# Patient Record
Sex: Female | Born: 1945 | Race: White | Hispanic: No | Marital: Married | Smoking: Never smoker
Health system: Southern US, Community
[De-identification: ages and names within clinical notes are randomized; demographics above are authoritative.]

## PROBLEM LIST (undated history)

## (undated) DIAGNOSIS — M1990 Unspecified osteoarthritis, unspecified site: Secondary | ICD-10-CM

## (undated) DIAGNOSIS — C50919 Malignant neoplasm of unspecified site of unspecified female breast: Secondary | ICD-10-CM

## (undated) DIAGNOSIS — C801 Malignant (primary) neoplasm, unspecified: Secondary | ICD-10-CM

## (undated) HISTORY — PX: WISDOM TOOTH EXTRACTION: SHX21

---

## 2015-12-14 DIAGNOSIS — H259 Unspecified age-related cataract: Secondary | ICD-10-CM | POA: Insufficient documentation

## 2018-12-07 DIAGNOSIS — H04123 Dry eye syndrome of bilateral lacrimal glands: Secondary | ICD-10-CM | POA: Insufficient documentation

## 2020-10-27 DIAGNOSIS — D649 Anemia, unspecified: Secondary | ICD-10-CM | POA: Insufficient documentation

## 2020-10-27 DIAGNOSIS — R7303 Prediabetes: Secondary | ICD-10-CM | POA: Insufficient documentation

## 2020-11-27 ENCOUNTER — Telehealth: Payer: Self-pay | Admitting: Hematology and Oncology

## 2020-11-27 NOTE — Telephone Encounter (Signed)
I received a call from Marie Day to schedule a new pt appt. She was recently dx w/breast cancer. Marie Day will be moving form [place] state. She has been scheduled to see Dr. Lindi Adie on 8/4 at 1pm.

## 2020-12-07 ENCOUNTER — Telehealth: Payer: Self-pay | Admitting: Hematology and Oncology

## 2020-12-07 NOTE — Telephone Encounter (Signed)
Marie Day cld to reschedule her appt w/Dr. Lindi Adie on 8/1 at 1pm.

## 2020-12-19 NOTE — Progress Notes (Signed)
[facility] CONSULT NOTE  Patient Care Team: Pcp, No as PCP - General  CHIEF COMPLAINTS/PURPOSE OF CONSULTATION:  Newly diagnosed invasive ductal carinoma   HISTORY OF PRESENTING ILLNESS:  Marie Day 75 y.o. female is here because of recent diagnosis of invasive ductal carcinoma of the right breast. She palpated a mass in the upper outer quadrant of the right breast. Diagnostic mammogram and [place] on 11/10/20 showed a spiculated mass in the upper outer quadrant of the right breast with associated malignant type microcalcifications 4-5 cm anterior to the lump, and no suspicious findings in the left breast. 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 She presents to the clinic today for initial evaluation and discussion of treatment options.  The entire initial work-up was performed at [facility] in [place].  She was planning on moving to [place] within the diagnosis came around they decided to move urgently.  I reviewed her records extensively and collaborated the history with the patient.  SUMMARY OF ONCOLOGIC HISTORY: Oncology History Overview Note  Invasive ductal carcinoma of the right breast  She palpated a mass in UOQ right breast. Diagnostic mammogram and [place] on 11/10/20 showed a spiculated mass in UOQ right breast with associated malignant type microcalcifications. 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     Malignant neoplasm of upper-outer quadrant of right breast in female, estrogen receptor negative ([place])  11/17/2020 Initial Diagnosis   Work-up performed at [facility]: Palpable right breast mass.  Mammogram and ultrasound 11/10/2020: Spiculated 4.9 cm mass UOQ right breast with microcalcifications, biopsy  revealed IDC ER/PR negative, HER2 positive 3+ by IHC, axillary lymph node positive (2 lymph nodes were detected)   12/03/2020 PET scan   Right breast malignancy SUV 4.6, subcentimeter right level 1 and 2 axillary lymph nodes.  No distant metastatic disease.     MEDICAL HISTORY: Osteoarthritis  SURGICAL HISTORY: No prior major surgeries,  water removal  SOCIAL HISTORY: Denies any tobacco.  Drinks alcohol socially.  FAMILY HISTORY: Mother passed away from metastatic carcinoma to the bone, father is alive at 50, maternal grandmother died of pancreatic cancer in her 70s, paternal grandmother died of pancreatic cancer in 40s   ALLERGIES:  has no allergies on file.  MEDICATIONS: Takes ibuprofen with Tylenol for arthritis  REVIEW OF SYSTEMS:   Constitutional: Denies fevers, chills or abnormal night sweats Eyes: Denies blurriness of vision, double vision or watery eyes Ears, nose, mouth, throat, and face: Denies mucositis or sore throat Respiratory: Denies cough, dyspnea or wheezes Cardiovascular: Denies palpitation, chest discomfort or lower extremity swelling Gastrointestinal:  Denies nausea, heartburn or change in bowel habits Skin: Denies abnormal skin rashes Lymphatics: Denies new lymphadenopathy or easy bruising Neurological:Denies numbness, tingling or new weaknesses, complains of severe osteoarthritis of her neck back arms and extremities Behavioral/Psych: Mood is stable, no new changes  Breast: Large palpable mass in the right breast All other systems were reviewed with the patient and are negative.  PHYSICAL EXAMINATION: ECOG PERFORMANCE STATUS: 1 - Symptomatic but completely ambulatory  Vitals:   12/21/20 1258  BP: (!) 115/49  Pulse: 73  Resp: 18  Temp: (!) 97.5 F (36.4 C)  SpO2: 99%   Filed Weights   12/21/20 1258  Weight: 164 lb 12.8 oz (74.8 kg)  GENERAL:alert, no distress and comfortable SKIN: skin color, texture, turgor are normal, no rashes or  significant lesions EYES: normal, conjunctiva are pink and non-injected, sclera clear OROPHARYNX:no exudate, no erythema and lips, buccal mucosa, and tongue normal  NECK: supple, thyroid normal size, non-tender, without nodularity LYMPH:  no palpable lymphadenopathy in the cervical, axillary or inguinal LUNGS: clear to auscultation and percussion with normal breathing effort HEART: regular rate & rhythm and no murmurs and no lower extremity edema ABDOMEN:abdomen soft, non-tender and normal bowel sounds Musculoskeletal:no cyanosis of digits and no clubbing  PSYCH: alert & oriented x 3 with fluent speech NEURO: no focal motor/sensory deficits BREAST: Large palpable mass in the right breast no palpable axillary or supraclavicular lymphadenopathy (exam performed in the presence of a chaperone)    ASSESSMENT AND PLAN:  Malignant neoplasm of upper-outer quadrant of right breast in female, estrogen receptor negative ([name]) 11/17/2020:Work-up performed at [facility]: Palpable right breast mass.  Mammogram and ultrasound 11/10/2020: Spiculated 4.9 cm mass UOQ right breast with microcalcifications, biopsy revealed IDC ER/PR negative, HER2 positive 3+ by IHC, axillary lymph node positive (2 lymph nodes were detected)  Genetics were negative  Pathology and radiology counseling: Discussed with the patient, the details of pathology including the type of breast cancer,the clinical staging, the significance of ER, PR and HER-2/neu receptors and the implications for treatment. After reviewing the pathology in detail, we proceeded to discuss the different treatment options between surgery, radiation, chemotherapy, antiestrogen therapies.  Recommendation based on multidisciplinary tumor board: 1. Neoadjuvant chemotherapy with TCH Perjeta 6 cycles followed by Herceptin Perjeta maintenance versus Kadcyla maintenance (based on response to neoadjuvant chemo) for 1 year 2. Followed by breast conserving surgery  if possible with targeted node surgery 3. Followed by adjuvant radiation therapy  Chemotherapy Counseling: I discussed the risks and benefits of chemotherapy including the risks of nausea/ vomiting, risk of infection from low WBC count, fatigue due to chemo or anemia, bruising or bleeding due to low platelets, mouth sores, loss/ change in taste and decreased appetite. Liver and kidney function will be monitored through out chemotherapy as abnormalities in liver and kidney function may be a side effect of treatment. Cardiac dysfunction due to Herceptin and Perjeta and neuropathy risk from Taxotere were discussed in detail. Risk of permanent bone marrow dysfunction due to chemo were also discussed.  Plan: 1. Echocardiogram 2. Chemotherapy class 3. Breast MRI 4.  On the initial ultrasound there was a report of microcalcifications.  It is possible that we might need to biopsy those.  Nausea clinical trial participation was also recommended. Return to clinic in 1 weeks to start chemotherapy.  All questions were answered. The patient knows to call the clinic with any problems, questions or concerns.   Rulon Eisenmenger, MD, MPH 12/21/2020    I, Thana Ates, am acting as scribe for Nicholas Lose, MD.  I have reviewed the above documentation for accuracy and completeness, and I agree with the above.

## 2020-12-21 ENCOUNTER — Encounter: Payer: Self-pay | Admitting: *Deleted

## 2020-12-21 ENCOUNTER — Inpatient Hospital Stay: Payer: Medicare (Managed Care) | Attending: Hematology and Oncology | Admitting: Hematology and Oncology

## 2020-12-21 ENCOUNTER — Telehealth: Payer: Self-pay | Admitting: *Deleted

## 2020-12-21 ENCOUNTER — Encounter: Payer: Self-pay | Admitting: Hematology and Oncology

## 2020-12-21 ENCOUNTER — Other Ambulatory Visit: Payer: Self-pay | Admitting: *Deleted

## 2020-12-21 ENCOUNTER — Other Ambulatory Visit: Payer: Self-pay

## 2020-12-21 VITALS — BP 115/49 | HR 73 | Temp 97.5°F | Resp 18 | Wt 164.8 lb

## 2020-12-21 DIAGNOSIS — C50411 Malignant neoplasm of upper-outer quadrant of right female breast: Secondary | ICD-10-CM | POA: Diagnosis not present

## 2020-12-21 DIAGNOSIS — Z5189 Encounter for other specified aftercare: Secondary | ICD-10-CM | POA: Diagnosis not present

## 2020-12-21 DIAGNOSIS — Z171 Estrogen receptor negative status [ER-]: Secondary | ICD-10-CM | POA: Insufficient documentation

## 2020-12-21 DIAGNOSIS — Z5111 Encounter for antineoplastic chemotherapy: Secondary | ICD-10-CM | POA: Diagnosis not present

## 2020-12-21 DIAGNOSIS — Z79899 Other long term (current) drug therapy: Secondary | ICD-10-CM | POA: Diagnosis not present

## 2020-12-21 MED ORDER — DEXAMETHASONE 4 MG PO TABS: 4.0000 mg | ORAL_TABLET | Freq: Every day | ORAL | 0 refills | Status: DC

## 2020-12-21 MED ORDER — ONDANSETRON HCL 8 MG PO TABS: 8.0000 mg | ORAL_TABLET | Freq: Two times a day (BID) | ORAL | 1 refills | Status: DC | PRN

## 2020-12-21 MED ORDER — LIDOCAINE-PRILOCAINE 2.5-2.5 % EX CREA: TOPICAL_CREAM | CUTANEOUS | 3 refills | Status: DC

## 2020-12-21 MED ORDER — PROCHLORPERAZINE MALEATE 10 MG PO TABS: 10.0000 mg | ORAL_TABLET | Freq: Four times a day (QID) | ORAL | 1 refills | Status: DC | PRN

## 2020-12-21 NOTE — Research (Signed)
Trial:  [id number] - TREATMENT OF REFRACTORY NAUSEA  Patient Marie Day was identified by Dr. Lindi Adie as a potential candidate for the above listed study.  This Clinical Research Nurse met with Marie Day, [id number], on 12/21/20 in a manner and location that ensures patient privacy to discuss participation in the above listed research study.  Patient is Accompanied by her son, Marie Day .  A copy of the informed consent document and separate HIPAA Authorization was provided to the patient.  Patient reads, speaks, and understands [place].   Patient was provided with the business card of this Nurse and encouraged to contact the research team with any questions.  Approximately 5 minutes were spent with the patient reviewing the informed consent documents.  Patient was provided the option of taking informed consent documents home to review and was encouraged to review at their convenience with their support network, including other care providers. Patient took the consent documents home to review. Foye Spurling, BSN, RN Clinical Research Nurse 12/21/2020

## 2020-12-21 NOTE — Assessment & Plan Note (Signed)
11/17/2020:Work-up performed at [facility]: Palpable right breast mass.  Mammogram and ultrasound 11/10/2020: Spiculated 4.9 cm mass UOQ right breast with microcalcifications, biopsy revealed IDC ER/PR negative, HER2 positive 3+ by IHC, axillary lymph node positive (2 lymph nodes were detected)  Pathology and radiology counseling: Discussed with the patient, the details of pathology including the type of breast cancer,the clinical staging, the significance of ER, PR and HER-2/neu receptors and the implications for treatment. After reviewing the pathology in detail, we proceeded to discuss the different treatment options between surgery, radiation, chemotherapy, antiestrogen therapies.  Recommendation based on multidisciplinary tumor board: 1. Neoadjuvant chemotherapy with TCH Perjeta 6 cycles followed by Herceptin Perjeta maintenance versus Kadcyla maintenance (based on response to neoadjuvant chemo) for 1 year 2. Followed by breast conserving surgery if possible with sentinel lymph node study 3. Followed by adjuvant radiation therapy if patient had lumpectomy  Chemotherapy Counseling: I discussed the risks and benefits of chemotherapy including the risks of nausea/ vomiting, risk of infection from low WBC count, fatigue due to chemo or anemia, bruising or bleeding due to low platelets, mouth sores, loss/ change in taste and decreased appetite. Liver and kidney function will be monitored through out chemotherapy as abnormalities in liver and kidney function may be a side effect of treatment. Cardiac dysfunction due to Herceptin and Perjeta and neuropathy risk from Taxotere were discussed in detail. Risk of permanent bone marrow dysfunction due to chemo were also discussed.  Plan: 1. Echocardiogram 2. Chemotherapy class 3. Breast MRI  Nausea clinical trial participation was also recommended. Return to clinic in 2 weeks to start chemotherapy.

## 2020-12-21 NOTE — Telephone Encounter (Signed)
Spoke to pt regarding navigation resources and provided navigation resources. Discussed breast MRI, echo and chemo class. Confirmed appt dates and time. Denies further questions or needs at this time.

## 2020-12-21 NOTE — Progress Notes (Signed)
START ON PATHWAY REGIMEN - Breast     A cycle is every 21 days:     Pertuzumab      Pertuzumab      Trastuzumab-xxxx      Trastuzumab-xxxx      Carboplatin      Docetaxel   **Always confirm dose/schedule in your pharmacy ordering system**  Patient Characteristics: Preoperative or Nonsurgical Candidate (Clinical Staging), Neoadjuvant Therapy followed by Surgery, Invasive Disease, Chemotherapy, HER2 Positive, ER Negative/Unknown Therapeutic Status: Preoperative or Nonsurgical Candidate (Clinical Staging) AJCC M Category: cM0 AJCC Grade: G3 Breast Surgical Plan: Neoadjuvant Therapy followed by Surgery ER Status: Negative (-) AJCC 8 Stage Grouping: IIB HER2 Status: Positive (+) AJCC T Category: cT2 AJCC N Category: cN1 PR Status: Negative (-) Intent of Therapy: Curative Intent, Discussed with Patient 

## 2020-12-22 ENCOUNTER — Telehealth: Payer: Self-pay | Admitting: *Deleted

## 2020-12-22 ENCOUNTER — Encounter: Payer: Self-pay | Admitting: Hematology and Oncology

## 2020-12-22 ENCOUNTER — Telehealth: Payer: Self-pay | Admitting: Hematology and Oncology

## 2020-12-22 NOTE — Telephone Encounter (Signed)
Discussed pre-meds/anti-nausea medications. Informed pt will discuss in chemo education as well. Discussed appts for 8/8 and 8/9.  Pt discussed cost of medications. Informed pt referral to [facility] will be made.

## 2020-12-22 NOTE — Telephone Encounter (Signed)
Scheduled appts per 8/1 sch msg. Called pt, no answer. Left msg with appts dates and times.

## 2020-12-22 NOTE — Progress Notes (Signed)
Pharmacist Chemotherapy Monitoring - Initial Assessment    Anticipated start date: 12/29/20   The following has been reviewed per standard work regarding the patient's treatment regimen: The patient's diagnosis, treatment plan and drug doses, and organ/hematologic function Lab orders and baseline tests specific to treatment regimen  The treatment plan start date, drug sequencing, and pre-medications Prior authorization status  Patient's documented medication list, including drug-drug interaction screen and prescriptions for anti-emetics and supportive care specific to the treatment regimen The drug concentrations, fluid compatibility, administration routes, and timing of the medications to be used The patient's access for treatment and lifetime cumulative dose history, if applicable  The patient's medication allergies and previous infusion related reactions, if applicable   Changes made to treatment plan:  treatment plan date  Follow up needed:  Pending authorization for treatment    Wynona Neat, [facility], 12/22/2020  12:03 PM

## 2020-12-24 ENCOUNTER — Ambulatory Visit
Admission: RE | Admit: 2020-12-24 | Discharge: 2020-12-24 | Disposition: A | Discharge status: Home / Self Care | Payer: Medicare (Managed Care) | Source: Ambulatory Visit | Attending: Hematology and Oncology | Admitting: Hematology and Oncology

## 2020-12-24 ENCOUNTER — Encounter: Payer: Self-pay | Admitting: Hematology and Oncology

## 2020-12-24 ENCOUNTER — Ambulatory Visit: Payer: Self-pay | Admitting: Hematology and Oncology

## 2020-12-24 ENCOUNTER — Other Ambulatory Visit: Payer: Self-pay

## 2020-12-24 ENCOUNTER — Telehealth: Payer: Self-pay | Admitting: *Deleted

## 2020-12-24 DIAGNOSIS — Z171 Estrogen receptor negative status [ER-]: Secondary | ICD-10-CM

## 2020-12-24 DIAGNOSIS — C50411 Malignant neoplasm of upper-outer quadrant of right female breast: Secondary | ICD-10-CM

## 2020-12-24 MED ORDER — GADOBUTROL 1 MMOL/ML IV SOLN
8.0000 mL | Freq: Once | INTRAVENOUS | Status: AC | PRN
  Administered 2020-12-24: 8 mL via INTRAVENOUS

## 2020-12-24 NOTE — Telephone Encounter (Signed)
[facility] Nausea Study: LVM for patient requesting call back to discuss her interest in this study.  Foye Spurling, BSN, RN Clinical Research Nurse 12/24/2020 1:21 PM

## 2020-12-24 NOTE — Telephone Encounter (Signed)
Patient returned call and states she hasn't had the opportunity to review the study consent form but is still interested and is willing to meet with research nurse tomorrow after her chemotherapy education class. She says she will try to make time to read it before then.  Thanked patient for her call and look forward to seeing her tomorrow afternoon. Foye Spurling, BSN, RN Clinical Research Nurse 12/24/2020 4:25 PM

## 2020-12-25 ENCOUNTER — Encounter: Payer: Self-pay | Admitting: Licensed Clinical Social Worker

## 2020-12-25 ENCOUNTER — Inpatient Hospital Stay: Payer: Medicare (Managed Care)

## 2020-12-25 ENCOUNTER — Encounter: Payer: Self-pay | Admitting: Hematology and Oncology

## 2020-12-25 ENCOUNTER — Ambulatory Visit (HOSPITAL_COMMUNITY)
Admission: RE | Admit: 2020-12-25 | Discharge: 2020-12-25 | Disposition: A | Discharge status: Home / Self Care | Payer: Medicare (Managed Care) | Source: Ambulatory Visit | Attending: Hematology and Oncology | Admitting: Hematology and Oncology

## 2020-12-25 ENCOUNTER — Encounter: Payer: Self-pay | Admitting: *Deleted

## 2020-12-25 DIAGNOSIS — C50411 Malignant neoplasm of upper-outer quadrant of right female breast: Secondary | ICD-10-CM

## 2020-12-25 DIAGNOSIS — Z171 Estrogen receptor negative status [ER-]: Secondary | ICD-10-CM | POA: Diagnosis not present

## 2020-12-25 DIAGNOSIS — Z0189 Encounter for other specified special examinations: Secondary | ICD-10-CM | POA: Diagnosis not present

## 2020-12-25 DIAGNOSIS — Z01810 Encounter for preprocedural cardiovascular examination: Secondary | ICD-10-CM | POA: Diagnosis present

## 2020-12-25 LAB — ECHOCARDIOGRAM COMPLETE
Area-P 1/2: 2.9 cm2
Calc EF: 60.8 %
S' Lateral: 3 cm
Single Plane A2C EF: 63.6 %
Single Plane A4C EF: 57.5 %

## 2020-12-25 NOTE — Progress Notes (Signed)
Called pt to introduce myself as her Arboriculturist.  Unfortunately there aren't any foundations offering copay assistance for her Dx and the type of ins she has.  I informed her of the [employer], went over what it covers and gave her the income requirement.  Pt stated she exceeds the income requirement so she doesn't qualify for the grant at this time.  She inquired about assistance for oral medications so I gave her the number to [facility] to see if they can provide her meds at a discounted price.

## 2020-12-25 NOTE — Progress Notes (Signed)
[facility] Clinical Social Work  Clinical Social Work was referred by RN navigator for assessment of psychosocial needs.  Clinical Social Worker met with patient  to offer support and assess for needs.    Patient recently moved to [place] from [place] (her son and daughter-in-law live here) for her cancer treatment. She is running into an issue where she needs to wait ~10 days for Medicare Part D and other insurance to transfer but needs to buy medications in the meantime in preparation for chemo treatment, one of which is over $100. The insurance told her to save receipts to get reimbursed once the change is processed.  CSW also provided information on GoodRx to potentially help find the medication for lower cost.  No other needs at this time.  CSW informed patient of the support team and support services at [facility].  CSW provided contact information and encouraged patient to call with any questions or concerns.   MICHELLE E ZAVALA, LCSW  Clinical Social Worker [facility]         

## 2020-12-25 NOTE — Progress Notes (Signed)
  Echocardiogram 2D Echocardiogram has been performed.  Marie Day 12/25/2020, 12:08 PM

## 2020-12-25 NOTE — Research (Signed)
[id number] - TREATMENT OF REFRACTORY NAUSEA  Met with patient alone in private room after her chemotherapy education class to discuss the above study. Patient states she had a chance to read the consent form and is not entirely comfortable participating in the study due to not knowing which drug she might be getting if she went onto cycle 2. Patient was also concerned her nausea might not be as well managed as it could be if she received study drug instead of standard of care treatment. Assured patient that participation is voluntary and okay to decline if she is not 100% comfortable with participation. Patient decided to decline this study.  Thanked patient for taking the time to meet with research nurse and consider this study.  Dr. Lindi Adie notified.  Foye Spurling, BSN, RN Clinical Research Nurse 12/25/2020 1:51 PM

## 2020-12-25 NOTE — Research (Signed)
DCP-001: Use of a Clinical Trial Screening Tool to Address Cancer Health Disparities in the [place] [facility])    Patient Marie Day was identified by this Clinical Research Nurse as a potential candidate for the above listed study.  This Clinical Research Nurse met with [name], MRN [id number], on 12/25/20 in a manner and location that ensures patient privacy to discuss participation in the above listed research study.  Patient is Unaccompanied.  A copy of the informed consent document and separate HIPAA Authorization was provided to the patient.  Patient reads, speaks, and understands [place].     Patient was provided the option of taking informed consent documents home to review and was encouraged to review at their convenience with their support network, including other care providers. Patient is comfortable with making a decision regarding study participation today.   As outlined in the informed consent form, this Nurse and Emelia Salisbury discussed the purpose of the research study, the investigational nature of the study, study procedures and requirements for study participation, potential risks and benefits of study participation, as well as alternatives to participation. This study is not blinded. The patient understands participation is voluntary and they may withdraw from study participation at any time.  This study does not involve randomization.  This study does not involve an investigational drug or device. This study does not involve a placebo. Patient understands enrollment is pending full eligibility review.   Confidentiality and how the patient's information will be used as part of study participation were discussed.  Patient was informed there is not reimbursement provided for their time and effort spent on trial participation.     All questions were answered to patient's satisfaction.  The informed consent and separate HIPAA Authorization was reviewed  page by page.  The patient's mental and emotional status is appropriate to provide informed consent, and the patient verbalizes an understanding of study participation.  Patient has agreed to participate in the above listed research study and has voluntarily signed the informed consent protocol version date 11/13/2018 and separate HIPAA Authorization, version 5 on 12/25/20 at 1:28 PM.  The patient was provided with a copy of the signed informed consent form and separate HIPAA Authorization for their reference.  No study specific procedures were obtained prior to the signing of the informed consent document.  Approximately 15 minutes were spent with the patient reviewing the informed consent documents.     After consent/hippa forms signed, this nurse interviewed patient to ask questions for the study that cannot be found in the EMR.  Patient answered without difficulty. Thanked patient for their time and participation on this study. Patient meets all eligibility criteria to be enrolled on this study.   Foye Spurling, BSN, RN Clinical Research Nurse 12/25/2020 1:58 PM

## 2020-12-26 NOTE — Progress Notes (Signed)
Patient Care Team: Pcp, No as PCP - General Pershing Proud, RN as Oncology Nurse Navigator Donnelly Angelica, RN as Oncology Nurse Navigator  DIAGNOSIS:    ICD-10-CM   1. Malignant neoplasm of upper-outer quadrant of right breast in female, estrogen receptor negative (HCC)  C50.411    Z17.1       SUMMARY OF ONCOLOGIC HISTORY: Oncology History Overview Note  Invasive ductal carcinoma of the right breast  She palpated a mass in UOQ right breast. Diagnostic mammogram and [place] on 11/10/20 showed a spiculated mass in UOQ right breast with associated malignant type microcalcifications. 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     Malignant neoplasm of upper-outer quadrant of right breast in female, estrogen receptor negative (HCC)  11/17/2020 Initial Diagnosis   Work-up performed at [facility]: Palpable right breast mass.  Mammogram and ultrasound 11/10/2020: Spiculated 4.9 cm mass UOQ right breast with microcalcifications, biopsy revealed IDC ER/PR negative, HER2 positive 3+ by IHC, axillary lymph node positive (2 lymph nodes were detected)   12/03/2020 PET scan   Right breast malignancy SUV 4.6, subcentimeter right level 1 and 2 axillary lymph nodes.  No distant metastatic disease.   12/21/2020 Cancer Staging   Staging form: Breast, AJCC 8th Edition - Clinical stage from 12/21/2020: Stage IIB (cT2, cN1, cM0, G3, ER-, PR-, HER2+) - Signed by Serena Croissant, MD on 12/21/2020  Stage prefix: Initial diagnosis  Histologic grading system: 3 grade system    12/29/2020 -  Chemotherapy    Patient is on Treatment Plan: BREAST  DOCETAXEL + CARBOPLATIN + TRASTUZUMAB + PERTUZUMAB  (TCHP) Q21D          CHIEF COMPLIANT: Cycle 1 TCH Perjeta to start 12/29/2020  INTERVAL HISTORY: Marie Day is a 75 y.o. with above-mentioned history of invasive ductal carcinoma of the right breast,  to begin chemotherapy with TCH Perjeta.  She reports no major concerns for starting chemotherapy tomorrow.  ALLERGIES:  is allergic to sulfa antibiotics.  MEDICATIONS:  Current Outpatient Medications  Medication Sig Dispense Refill   dexamethasone (DECADRON) 4 MG tablet Take 1 tablet (4 mg total) by mouth daily. Take 1 tablet day before chemo and 1 tablet day after chemo with food 12 tablet 0   lidocaine-prilocaine (EMLA) cream Apply to affected area once 30 g 3   ondansetron (ZOFRAN) 8 MG tablet Take 1 tablet (8 mg total) by mouth 2 (two) times daily as needed (Nausea or vomiting). Start on the third day after chemotherapy. 30 tablet 1   prochlorperazine (COMPAZINE) 10 MG tablet Take 1 tablet (10 mg total) by mouth every 6 (six) hours as needed (Nausea or vomiting). 30 tablet 1   No current facility-administered medications for this visit.   Facility-Administered Medications Ordered in Other Visits  Medication Dose Route Frequency Provider Last Rate Last Admin   sodium chloride flush (NS) 0.9 % injection 10 mL  10 mL Intravenous PRN Serena Croissant, MD   10 mL at 12/28/20 1428    PHYSICAL EXAMINATION: ECOG PERFORMANCE STATUS: 1 - Symptomatic but completely ambulatory  There were no vitals filed for this visit. There were no vitals filed for this visit.  LABORATORY DATA:  I have reviewed the data as listed No flowsheet data found.  Lab Results  Component Value Date   WBC 8.8 12/28/2020   HGB 11.2 (L) 12/28/2020   HCT 35.0 (L) 12/28/2020  MCV 87.5 12/28/2020   PLT 400 12/28/2020   NEUTROABS 5.2 12/28/2020    ASSESSMENT & PLAN:  Malignant neoplasm of upper-outer quadrant of right breast in female, estrogen receptor negative ([place]) 11/17/2020:Work-up performed at [facility]: Palpable right breast mass.  Mammogram and ultrasound 11/10/2020: Spiculated 4.9 cm mass UOQ right breast with microcalcifications, biopsy revealed IDC ER/PR negative, HER2 positive 3+ by IHC, axillary  lymph node positive (2 lymph nodes were detected)   Genetics were negative  Treatment Plan based on multidisciplinary tumor board: 1. Neoadjuvant chemotherapy with TCH Perjeta 6 cycles (started 12/29/2020) followed by Herceptin Perjeta maintenance versus Kadcyla maintenance (based on response to neoadjuvant chemo) for 1 year 2. Followed by breast conserving surgery if possible with targeted node surgery 3. Followed by adjuvant radiation therapy ------------------------------------------------------------------------------------------------------------------- Current Treatment: cycle 1 day 1 TCHP Anti-emetics reviewed, Chemo consent obtained, chemo education completed  RTC in 1 week for tox check    No orders of the defined types were placed in this encounter.  The patient has a good understanding of the overall plan. she agrees with it. she will call with any problems that may develop before the next visit here.  Total time spent: 30 mins including face to face time and time spent for planning, charting and coordination of care  Rulon Eisenmenger, MD, MPH 12/28/2020  I, Thana Ates, am acting as scribe for Dr. Nicholas Lose.  I have reviewed the above documentation for accuracy and completeness, and I agree with the above.

## 2020-12-27 NOTE — Assessment & Plan Note (Signed)
11/17/2020:Work-up performed at [facility]: Palpable right breast mass.  Mammogram and ultrasound 11/10/2020: Spiculated 4.9 cm mass UOQ right breast with microcalcifications, biopsy revealed IDC ER/PR negative, HER2 positive 3+ by IHC, axillary lymph node positive (2 lymph nodes were detected)  Genetics were negative  Treatment Plan based on multidisciplinary tumor board: 1. Neoadjuvant chemotherapy with TCH Perjeta 6 cycles followed by Herceptin Perjeta maintenance versus Kadcyla maintenance (based on response to neoadjuvant chemo) for 1 year 2. Followed by breast conserving surgery if possible with targeted node surgery 3. Followed by adjuvant radiation therapy ------------------------------------------------------------------------------------------------------------------- Current Treatment: cycle 1 day 1 TCHP Anti-emetics reviewed, Chemo consent obtained, chemo education completed  RTC in 1 week for tox check

## 2020-12-28 ENCOUNTER — Inpatient Hospital Stay (HOSPITAL_BASED_OUTPATIENT_CLINIC_OR_DEPARTMENT_OTHER): Payer: Medicare (Managed Care) | Admitting: Hematology and Oncology

## 2020-12-28 ENCOUNTER — Inpatient Hospital Stay: Payer: Medicare (Managed Care)

## 2020-12-28 ENCOUNTER — Other Ambulatory Visit: Payer: Self-pay

## 2020-12-28 ENCOUNTER — Encounter: Payer: Self-pay | Admitting: Hematology and Oncology

## 2020-12-28 DIAGNOSIS — Z95828 Presence of other vascular implants and grafts: Secondary | ICD-10-CM

## 2020-12-28 DIAGNOSIS — C50411 Malignant neoplasm of upper-outer quadrant of right female breast: Secondary | ICD-10-CM | POA: Diagnosis not present

## 2020-12-28 DIAGNOSIS — Z171 Estrogen receptor negative status [ER-]: Secondary | ICD-10-CM | POA: Diagnosis not present

## 2020-12-28 DIAGNOSIS — Z5111 Encounter for antineoplastic chemotherapy: Secondary | ICD-10-CM | POA: Diagnosis not present

## 2020-12-28 LAB — CMP (CANCER CENTER ONLY)
ALT: 18 U/L (ref 0–44)
AST: 16 U/L (ref 15–41)
Albumin: 4 g/dL (ref 3.5–5.0)
Alkaline Phosphatase: 116 U/L (ref 38–126)
Anion gap: 11 (ref 5–15)
BUN: 11 mg/dL (ref 8–23)
CO2: 26 mmol/L (ref 22–32)
Calcium: 10.1 mg/dL (ref 8.9–10.3)
Chloride: 104 mmol/L (ref 98–111)
Creatinine: 0.7 mg/dL (ref 0.44–1.00)
GFR, Estimated: >60 mL/min (ref >60)
Glucose, Bld: 97 mg/dL (ref 70–99)
Potassium: 4.2 mmol/L (ref 3.5–5.1)
Sodium: 141 mmol/L (ref 135–145)
Total Bilirubin: 0.3 mg/dL (ref 0.3–1.2)
Total Protein: 7.5 g/dL (ref 6.5–8.1)

## 2020-12-28 LAB — CBC WITH DIFFERENTIAL (CANCER CENTER ONLY)
Abs Immature Granulocytes: 0.02 10*3/uL (ref 0.00–0.07)
Basophils Absolute: 0.1 10*3/uL (ref 0.0–0.1)
Basophils Relative: 1 %
Eosinophils Absolute: 0.2 10*3/uL (ref 0.0–0.5)
Eosinophils Relative: 2 %
HCT: 35 % — ABNORMAL LOW (ref 36.0–46.0)
Hemoglobin: 11.2 g/dL — ABNORMAL LOW (ref 12.0–15.0)
Immature Granulocytes: 0 %
Lymphocytes Relative: 29 %
Lymphs Abs: 2.6 10*3/uL (ref 0.7–4.0)
MCH: 28 pg (ref 26.0–34.0)
MCHC: 32 g/dL (ref 30.0–36.0)
MCV: 87.5 fL (ref 80.0–100.0)
Monocytes Absolute: 0.8 10*3/uL (ref 0.1–1.0)
Monocytes Relative: 9 %
Neutro Abs: 5.2 10*3/uL (ref 1.7–7.7)
Neutrophils Relative %: 59 %
Platelet Count: 400 10*3/uL (ref 150–400)
RBC: 4 MIL/uL (ref 3.87–5.11)
RDW: 13.6 % (ref 11.5–15.5)
WBC Count: 8.8 10*3/uL (ref 4.0–10.5)
nRBC: 0 % (ref 0.0–0.2)

## 2020-12-28 MED ORDER — HEPARIN SOD (PORK) LOCK FLUSH 100 UNIT/ML IV SOLN
500.0000 [IU] | Freq: Once | INTRAVENOUS | Status: AC
  Administered 2020-12-28: 500 [IU] via INTRAVENOUS
  Filled 2020-12-28: qty 5

## 2020-12-28 MED ORDER — SODIUM CHLORIDE 0.9% FLUSH
10.0000 mL | INTRAVENOUS | Status: DC | PRN
  Administered 2020-12-28: 10 mL via INTRAVENOUS
  Filled 2020-12-28: qty 10

## 2020-12-29 ENCOUNTER — Encounter: Payer: Self-pay | Admitting: *Deleted

## 2020-12-29 ENCOUNTER — Inpatient Hospital Stay: Payer: Medicare (Managed Care)

## 2020-12-29 VITALS — BP 136/71 | HR 61 | Temp 98.1°F | Resp 16 | Ht 65.5 in

## 2020-12-29 DIAGNOSIS — Z5111 Encounter for antineoplastic chemotherapy: Secondary | ICD-10-CM | POA: Diagnosis not present

## 2020-12-29 DIAGNOSIS — C50411 Malignant neoplasm of upper-outer quadrant of right female breast: Secondary | ICD-10-CM

## 2020-12-29 DIAGNOSIS — Z171 Estrogen receptor negative status [ER-]: Secondary | ICD-10-CM

## 2020-12-29 MED ORDER — CARBOPLATIN CHEMO INJECTION 600 MG/60ML
500.0000 mg | Freq: Once | INTRAVENOUS | Status: AC
  Administered 2020-12-29: 500 mg via INTRAVENOUS
  Filled 2020-12-29: qty 50

## 2020-12-29 MED ORDER — PALONOSETRON HCL INJECTION 0.25 MG/5ML
0.2500 mg | Freq: Once | INTRAVENOUS | Status: AC
  Administered 2020-12-29: 0.25 mg via INTRAVENOUS

## 2020-12-29 MED ORDER — SODIUM CHLORIDE 0.9 % IV SOLN
420.0000 mg | Freq: Once | INTRAVENOUS | Status: AC
  Administered 2020-12-29: 420 mg via INTRAVENOUS
  Filled 2020-12-29: qty 14

## 2020-12-29 MED ORDER — SODIUM CHLORIDE 0.9% FLUSH
10.0000 mL | INTRAVENOUS | Status: DC | PRN
  Administered 2020-12-29: 10 mL
  Filled 2020-12-29: qty 10

## 2020-12-29 MED ORDER — TRASTUZUMAB-DKST CHEMO 150 MG IV SOLR
600.0000 mg | Freq: Once | INTRAVENOUS | Status: AC
  Administered 2020-12-29: 600 mg via INTRAVENOUS
  Filled 2020-12-29: qty 28.57

## 2020-12-29 MED ORDER — SODIUM CHLORIDE 0.9 % IV SOLN
10.0000 mg | Freq: Once | INTRAVENOUS | Status: AC
  Administered 2020-12-29: 10 mg via INTRAVENOUS
  Filled 2020-12-29: qty 10

## 2020-12-29 MED ORDER — PALONOSETRON HCL INJECTION 0.25 MG/5ML
INTRAVENOUS | Status: AC
  Filled 2020-12-29: qty 5

## 2020-12-29 MED ORDER — SODIUM CHLORIDE 0.9 % IV SOLN
Freq: Once | INTRAVENOUS | Status: AC
  Filled 2020-12-29: qty 250

## 2020-12-29 MED ORDER — ACETAMINOPHEN 325 MG PO TABS
650.0000 mg | ORAL_TABLET | Freq: Once | ORAL | Status: AC
  Administered 2020-12-29: 650 mg via ORAL

## 2020-12-29 MED ORDER — ACETAMINOPHEN 325 MG PO TABS
ORAL_TABLET | ORAL | Status: AC
  Filled 2020-12-29: qty 2

## 2020-12-29 MED ORDER — HEPARIN SOD (PORK) LOCK FLUSH 100 UNIT/ML IV SOLN
500.0000 [IU] | Freq: Once | INTRAVENOUS | Status: AC | PRN
  Administered 2020-12-29: 500 [IU]
  Filled 2020-12-29: qty 5

## 2020-12-29 MED ORDER — SODIUM CHLORIDE 0.9 % IV SOLN
150.0000 mg | Freq: Once | INTRAVENOUS | Status: AC
  Administered 2020-12-29: 150 mg via INTRAVENOUS
  Filled 2020-12-29: qty 150

## 2020-12-29 MED ORDER — DIPHENHYDRAMINE HCL 25 MG PO CAPS
50.0000 mg | ORAL_CAPSULE | Freq: Once | ORAL | Status: AC
  Administered 2020-12-29: 50 mg via ORAL

## 2020-12-29 MED ORDER — DOCETAXEL CHEMO INJECTION 160 MG/16ML
75.0000 mg/m2 | Freq: Once | INTRAVENOUS | Status: AC
  Administered 2020-12-29: 140 mg via INTRAVENOUS
  Filled 2020-12-29: qty 14

## 2020-12-29 MED ORDER — DIPHENHYDRAMINE HCL 25 MG PO CAPS
ORAL_CAPSULE | ORAL | Status: AC
  Filled 2020-12-29: qty 2

## 2020-12-29 NOTE — Patient Instructions (Signed)
[facility]  Discharge Instructions: Thank you for choosing [facility] to provide your oncology and hematology care.   If you have a lab appointment with the [facility], please go directly to the [facility] and check in at the registration area.   Wear comfortable clothing and clothing appropriate for easy access to any Portacath or PICC line.   We strive to give you quality time with your provider. You may need to reschedule your appointment if you arrive late (15 or more minutes).  Arriving late affects you and other patients whose appointments are after yours.  Also, if you miss three or more appointments without notifying the office, you may be dismissed from the clinic at the provider's discretion.      For prescription refill requests, have your pharmacy contact our office and allow 72 hours for refills to be completed.    Today you received the following chemotherapy and/or immunotherapy agents: Herceptin, perjeta, docetaxel, carboplatin.       To help prevent nausea and vomiting after your treatment, we encourage you to take your nausea medication as directed.  BELOW ARE SYMPTOMS THAT SHOULD BE REPORTED IMMEDIATELY: *FEVER GREATER THAN 100.4 F (38 C) OR HIGHER *CHILLS OR SWEATING *NAUSEA AND VOMITING THAT IS NOT CONTROLLED WITH YOUR NAUSEA MEDICATION *UNUSUAL SHORTNESS OF BREATH *UNUSUAL BRUISING OR BLEEDING *URINARY PROBLEMS (pain or burning when urinating, or frequent urination) *BOWEL PROBLEMS (unusual diarrhea, constipation, pain near the anus) TENDERNESS IN MOUTH AND THROAT WITH OR WITHOUT PRESENCE OF ULCERS (sore throat, sores in mouth, or a toothache) UNUSUAL RASH, SWELLING OR PAIN  UNUSUAL VAGINAL DISCHARGE OR ITCHING   Items with * indicate a potential emergency and should be followed up as soon as possible or go to the Emergency Department if any problems should occur.  Please show the CHEMOTHERAPY ALERT CARD or  IMMUNOTHERAPY ALERT CARD at check-in to the Emergency Department and triage nurse.  Should you have questions after your visit or need to cancel or reschedule your appointment, please contact [facility]  Dept: [phone]  and follow the prompts.  Office hours are 8:00 a.m. to 4:30 p.m. Monday - Friday. Please note that voicemails left after 4:00 p.m. may not be returned until the following business day.  We are closed weekends and major holidays. You have access to a nurse at all times for urgent questions. Please call the main number to the clinic Dept: [phone] and follow the prompts.   For any non-urgent questions, you may also contact your provider using MyChart. We now offer e-Visits for anyone 47 and older to request care online for non-urgent symptoms. For details visit [email or web address].   Also download the MyChart app! Go to the app store, search "MyChart", open the app, select [facility], and log in with your MyChart username and password.  Due to Covid, a mask is required upon entering the hospital/clinic. If you do not have a mask, one will be given to you upon arrival. For doctor visits, patients may have 1 support person aged 14 or older with them. For treatment visits, patients cannot have anyone with them due to current Covid guidelines and our immunocompromised population.   Trastuzumab injection for infusion What is this medication? TRASTUZUMAB (tras TOO zoo mab) is a monoclonal antibody. It is used to treatbreast cancer and stomach cancer. This medicine may be used for other purposes; ask your health care provider orpharmacist if you have questions. COMMON  BRAND NAME(S): Herceptin, Galvin Proffer, Trazimera What should I tell my care team before I take this medication? They need to know if you have any of these conditions: heart disease heart failure lung or breathing disease, like asthma an unusual or allergic  reaction to trastuzumab, benzyl alcohol, or other medications, foods, dyes, or preservatives pregnant or trying to get pregnant breast-feeding How should I use this medication? This drug is given as an infusion into a vein. It is administered in a hospitalor clinic by a specially trained health care professional. Talk to your pediatrician regarding the use of this medicine in children. Thismedicine is not approved for use in children. Overdosage: If you think you have taken too much of this medicine contact apoison control center or emergency room at once. NOTE: This medicine is only for you. Do not share this medicine with others. What if I miss a dose? It is important not to miss a dose. Call your doctor or health careprofessional if you are unable to keep an appointment. What may interact with this medication? This medicine may interact with the following medications: certain types of chemotherapy, such as daunorubicin, doxorubicin, epirubicin, and idarubicin This list may not describe all possible interactions. Give your health care provider a list of all the medicines, herbs, non-prescription drugs, or dietary supplements you use. Also tell them if you smoke, drink alcohol, or use illegaldrugs. Some items may interact with your medicine. What should I watch for while using this medication? Visit your doctor for checks on your progress. Report any side effects. Continue your course of treatment even though you feel ill unless your doctortells you to stop. Call your doctor or health care professional for advice if you get a fever, chills or sore throat, or other symptoms of a cold or flu. Do not treatyourself. Try to avoid being around people who are sick. You may experience fever, chills and shaking during your first infusion. These effects are usually mild and can be treated with other medicines. Report any side effects during the infusion to your health care professional. Fever andchills  usually do not happen with later infusions. Do not become pregnant while taking this medicine or for 7 months after stopping it. Women should inform their doctor if they wish to become pregnant or think they might be pregnant. Women of child-bearing potential will need to have a negative pregnancy test before starting this medicine. There is a potential for serious side effects to an unborn child. Talk to your health care professional or pharmacist for more information. Do not breast-feed an infantwhile taking this medicine or for 7 months after stopping it. Women must use effective birth control with this medicine. What side effects may I notice from receiving this medication? Side effects that you should report to your doctor or health care professionalas soon as possible: allergic reactions like skin rash, itching or hives, swelling of the face, lips, or tongue chest pain or palpitations cough dizziness feeling faint or lightheaded, falls fever general ill feeling or flu-like symptoms signs of worsening heart failure like breathing problems; swelling in your legs and feet unusually weak or tired Side effects that usually do not require medical attention (report to yourdoctor or health care professional if they continue or are bothersome): bone pain changes in taste diarrhea joint pain nausea/vomiting weight loss This list may not describe all possible side effects. Call your doctor for medical advice about side effects. You may report side effects to FDA at1-800-FDA-1088. Where  should I keep my medication? This drug is given in a hospital or clinic and will not be stored at home. NOTE: This sheet is a summary. It may not cover all possible information. If you have questions about this medicine, talk to your doctor, pharmacist, orhealth care provider.  2022 Elsevier/Gold Standard (2016-05-03 14:37:52)  Pertuzumab injection What is this medication? PERTUZUMAB (per TOOZ ue mab) is a  monoclonal antibody. It is used to treatbreast cancer. This medicine may be used for other purposes; ask your health care provider orpharmacist if you have questions. COMMON BRAND NAME(S): PERJETA What should I tell my care team before I take this medication? They need to know if you have any of these conditions: heart disease heart failure high blood pressure history of irregular heart beat recent or ongoing radiation therapy an unusual or allergic reaction to pertuzumab, other medicines, foods, dyes, or preservatives pregnant or trying to get pregnant breast-feeding How should I use this medication? This medicine is for infusion into a vein. It is given by a health careprofessional in a hospital or clinic setting. Talk to your pediatrician regarding the use of this medicine in children.Special care may be needed. Overdosage: If you think you have taken too much of this medicine contact apoison control center or emergency room at once. NOTE: This medicine is only for you. Do not share this medicine with others. What if I miss a dose? It is important not to miss your dose. Call your doctor or health careprofessional if you are unable to keep an appointment. What may interact with this medication? Interactions are not expected. Give your health care provider a list of all the medicines, herbs, non-prescription drugs, or dietary supplements you use. Also tell them if you smoke, drink alcohol, or use illegal drugs. Some items may interact with yourmedicine. This list may not describe all possible interactions. Give your health care provider a list of all the medicines, herbs, non-prescription drugs, or dietary supplements you use. Also tell them if you smoke, drink alcohol, or use illegaldrugs. Some items may interact with your medicine. What should I watch for while using this medication? Your condition will be monitored carefully while you are receiving this medicine. Report any side effects.  Continue your course of treatment eventhough you feel ill unless your doctor tells you to stop. Do not become pregnant while taking this medicine or for 7 months after stopping it. Women should inform their doctor if they wish to become pregnant or think they might be pregnant. Women of child-bearing potential will need to have a negative pregnancy test before starting this medicine. There is a potential for serious side effects to an unborn child. Talk to your health care professional or pharmacist for more information. Do not breast-feed an infantwhile taking this medicine or for 7 months after stopping it. Women must use effective birth control with this medicine. Call your doctor or health care professional for advice if you get a fever, chills or sore throat, or other symptoms of a cold or flu. Do not treatyourself. Try to avoid being around people who are sick. You may experience fever, chills, and headache during the infusion. Report anyside effects during the infusion to your health care professional. What side effects may I notice from receiving this medication? Side effects that you should report to your doctor or health care professionalas soon as possible: breathing problems chest pain or palpitations dizziness feeling faint or lightheaded fever or chills skin rash, itching or hives sore  throat swelling of the face, lips, or tongue swelling of the legs or ankles unusually weak or tired Side effects that usually do not require medical attention (report to yourdoctor or health care professional if they continue or are bothersome): diarrhea hair loss nausea, vomiting tiredness This list may not describe all possible side effects. Call your doctor for medical advice about side effects. You may report side effects to FDA at1-800-FDA-1088. Where should I keep my medication? This drug is given in a hospital or clinic and will not be stored at home. NOTE: This sheet is a summary. It may  not cover all possible information. If you have questions about this medicine, talk to your doctor, pharmacist, orhealth care provider.  2022 Elsevier/Gold Standard (2015-06-11 12:08:50)  Docetaxel injection What is this medication? DOCETAXEL (doe se TAX el) is a chemotherapy drug. It targets fast dividing cells, like cancer cells, and causes these cells to die. This medicine is used to treat many types of cancers like breast cancer, certain stomach cancers,head and neck cancer, lung cancer, and prostate cancer. This medicine may be used for other purposes; ask your health care provider orpharmacist if you have questions. COMMON BRAND NAME(S): Docefrez, Taxotere What should I tell my care team before I take this medication? They need to know if you have any of these conditions: infection (especially a virus infection such as chickenpox, cold sores, or herpes) liver disease low blood counts, like low white cell, platelet, or red cell counts an unusual or allergic reaction to docetaxel, polysorbate 80, other chemotherapy agents, other medicines, foods, dyes, or preservatives pregnant or trying to get pregnant breast-feeding How should I use this medication? This drug is given as an infusion into a vein. It is administered in a hospitalor clinic by a specially trained health care professional. Talk to your pediatrician regarding the use of this medicine in children.Special care may be needed. Overdosage: If you think you have taken too much of this medicine contact apoison control center or emergency room at once. NOTE: This medicine is only for you. Do not share this medicine with others. What if I miss a dose? It is important not to miss your dose. Call your doctor or health careprofessional if you are unable to keep an appointment. What may interact with this medication? Do not take this medicine with any of the following medications: live virus vaccines This medicine may also interact  with the following medications: aprepitant certain antibiotics like erythromycin or clarithromycin certain antivirals for HIV or hepatitis certain medicines for fungal infections like fluconazole, itraconazole, ketoconazole, posaconazole, or voriconazole cimetidine ciprofloxacin conivaptan cyclosporine dronedarone fluvoxamine grapefruit juice imatinib verapamil This list may not describe all possible interactions. Give your health care provider a list of all the medicines, herbs, non-prescription drugs, or dietary supplements you use. Also tell them if you smoke, drink alcohol, or use illegaldrugs. Some items may interact with your medicine. What should I watch for while using this medication? Your condition will be monitored carefully while you are receiving this medicine. You will need important blood work done while you are taking thismedicine. Call your doctor or health care professional for advice if you get a fever, chills or sore throat, or other symptoms of a cold or flu. Do not treat yourself. This drug decreases your body's ability to fight infections. Try toavoid being around people who are sick. Some products may contain alcohol. Ask your health care professional if this medicine contains alcohol. Be sure to tell all health  care professionals you are taking this medicine. Certain medicines, like metronidazole and disulfiram, can cause an unpleasant reaction when taken with alcohol. The reaction includes flushing, headache, nausea, vomiting, sweating, and increased thirst. Thereaction can last from 30 minutes to several hours. You may get drowsy or dizzy. Do not drive, use machinery, or do anything that needs mental alertness until you know how this medicine affects you. Do not stand or sit up quickly, especially if you are an older patient. This reduces the risk of dizzy or fainting spells. Alcohol may interfere with the effect ofthis medicine. Talk to your health care professional  about your risk of cancer. You may bemore at risk for certain types of cancer if you take this medicine. Do not become pregnant while taking this medicine or for 6 months after stopping it. Women should inform their doctor if they wish to become pregnant or think they might be pregnant. There is a potential for serious side effects to an unborn child. Talk to your health care professional or pharmacist for more information. Do not breast-feed an infant while taking this medicine orfor 1 week after stopping it. Males who get this medicine must use a condom during sex with females who can get pregnant. If you get a woman pregnant, the baby could have birth defects. The baby could die before they are born. You will need to continue wearing a condom for 3 months after stopping the medicine. Tell your health care providerright away if your partner becomes pregnant while you are taking this medicine. This may interfere with the ability to father a child. You should talk to yourdoctor or health care professional if you are concerned about your fertility. What side effects may I notice from receiving this medication? Side effects that you should report to your doctor or health care professionalas soon as possible: allergic reactions like skin rash, itching or hives, swelling of the face, lips, or tongue blurred vision breathing problems changes in vision low blood counts - This drug may decrease the number of white blood cells, red blood cells and platelets. You may be at increased risk for infections and bleeding. nausea and vomiting pain, redness or irritation at site where injected pain, tingling, numbness in the hands or feet redness, blistering, peeling, or loosening of the skin, including inside the mouth signs of decreased platelets or bleeding - bruising, pinpoint red spots on the skin, black, tarry stools, nosebleeds signs of decreased red blood cells - unusually weak or tired, fainting spells,  lightheadedness signs of infection - fever or chills, cough, sore throat, pain or difficulty passing urine swelling of the ankle, feet, hands Side effects that usually do not require medical attention (report to yourdoctor or health care professional if they continue or are bothersome): constipation diarrhea fingernail or toenail changes hair loss loss of appetite mouth sores muscle pain This list may not describe all possible side effects. Call your doctor for medical advice about side effects. You may report side effects to FDA at1-800-FDA-1088. Where should I keep my medication? This drug is given in a hospital or clinic and will not be stored at home. NOTE: This sheet is a summary. It may not cover all possible information. If you have questions about this medicine, talk to your doctor, pharmacist, orhealth care provider.  2022 Elsevier/Gold Standard (2019-04-08 19:50:31)  Carboplatin injection What is this medication? CARBOPLATIN (KAR boe pla tin) is a chemotherapy drug. It targets fast dividing cells, like cancer cells, and causes these cells  to die. This medicine is usedto treat ovarian cancer and many other cancers. This medicine may be used for other purposes; ask your health care provider orpharmacist if you have questions. COMMON BRAND NAME(S): Paraplatin What should I tell my care team before I take this medication? They need to know if you have any of these conditions: blood disorders hearing problems kidney disease recent or ongoing radiation therapy an unusual or allergic reaction to carboplatin, cisplatin, other chemotherapy, other medicines, foods, dyes, or preservatives pregnant or trying to get pregnant breast-feeding How should I use this medication? This drug is usually given as an infusion into a vein. It is administered in [place] or clinic by a specially trained health care professional. Talk to your pediatrician regarding the use of this medicine in  children.Special care may be needed. Overdosage: If you think you have taken too much of this medicine contact apoison control center or emergency room at once. NOTE: This medicine is only for you. Do not share this medicine with others. What if I miss a dose? It is important not to miss a dose. Call your doctor or health careprofessional if you are unable to keep an appointment. What may interact with this medication? medicines for seizures medicines to increase blood counts like filgrastim, pegfilgrastim, sargramostim some antibiotics like amikacin, gentamicin, neomycin, streptomycin, tobramycin vaccines Talk to your doctor or health care professional before taking any of thesemedicines: acetaminophen aspirin ibuprofen ketoprofen naproxen This list may not describe all possible interactions. Give your health care provider a list of all the medicines, herbs, non-prescription drugs, or dietary supplements you use. Also tell them if you smoke, drink alcohol, or use illegaldrugs. Some items may interact with your medicine. What should I watch for while using this medication? Your condition will be monitored carefully while you are receiving this medicine. You will need important blood work done while you are taking thismedicine. This drug may make you feel generally unwell. This is not uncommon, as chemotherapy can affect healthy cells as well as cancer cells. Report any side effects. Continue your course of treatment even though you feel ill unless yourdoctor tells you to stop. In some cases, you may be given additional medicines to help with side effects.Follow all directions for their use. Call your doctor or health care professional for advice if you get a fever, chills or sore throat, or other symptoms of a cold or flu. Do not treat yourself. This drug decreases your body's ability to fight infections. Try toavoid being around people who are sick. This medicine may increase your risk to  bruise or bleed. Call your doctor orhealth care professional if you notice any unusual bleeding. Be careful brushing and flossing your teeth or using a toothpick because you may get an infection or bleed more easily. If you have any dental work done,tell your dentist you are receiving this medicine. Avoid taking products that contain aspirin, acetaminophen, ibuprofen, naproxen, or ketoprofen unless instructed by your doctor. These medicines may hide afever. Do not become pregnant while taking this medicine. Women should inform their doctor if they wish to become pregnant or think they might be pregnant. There is a potential for serious side effects to an unborn child. Talk to your health care professional or pharmacist for more information. Do not breast-feed aninfant while taking this medicine. What side effects may I notice from receiving this medication? Side effects that you should report to your doctor or health care professionalas soon as possible: allergic reactions like skin  rash, itching or hives, swelling of the face, lips, or tongue signs of infection - fever or chills, cough, sore throat, pain or difficulty passing urine signs of decreased platelets or bleeding - bruising, pinpoint red spots on the skin, black, tarry stools, nosebleeds signs of decreased red blood cells - unusually weak or tired, fainting spells, lightheadedness breathing problems changes in hearing changes in vision chest pain high blood pressure low blood counts - This drug may decrease the number of white blood cells, red blood cells and platelets. You may be at increased risk for infections and bleeding. nausea and vomiting pain, swelling, redness or irritation at the injection site pain, tingling, numbness in the hands or feet problems with balance, talking, walking trouble passing urine or change in the amount of urine Side effects that usually do not require medical attention (report to yourdoctor or health  care professional if they continue or are bothersome): hair loss loss of appetite metallic taste in the mouth or changes in taste This list may not describe all possible side effects. Call your doctor for medical advice about side effects. You may report side effects to FDA at1-800-FDA-1088. Where should I keep my medication? This drug is given in a hospital or clinic and will not be stored at home. NOTE: This sheet is a summary. It may not cover all possible information. If you have questions about this medicine, talk to your doctor, pharmacist, orhealth care provider.  2022 Elsevier/Gold Standard (2007-08-14 14:38:05)

## 2020-12-29 NOTE — Progress Notes (Signed)
Decrease Carboplatin dose to '500mg'$  per MD.  Acquanetta Belling, RPH, BCPS, BCOP 12/29/2020 11:02 AM

## 2020-12-30 ENCOUNTER — Telehealth: Payer: Self-pay | Admitting: *Deleted

## 2020-12-30 NOTE — Telephone Encounter (Addendum)
Called & left message for pt to return call to let us know how she did with her treatment yesterday.  Pt returned call @ 2:05 pm & reported that she was doing well.

## 2020-12-30 NOTE — Telephone Encounter (Signed)
-----   Message from Wylene Men, RN sent at 12/29/2020  5:46 PM EDT ----- Regarding: [place] Patient received 1st time TCHP.  Tolerated well.  No s/s or c/o distress or discomfort.

## 2020-12-31 ENCOUNTER — Other Ambulatory Visit: Payer: Self-pay

## 2020-12-31 ENCOUNTER — Inpatient Hospital Stay: Payer: Medicare (Managed Care)

## 2020-12-31 VITALS — BP 150/75 | HR 69 | Temp 98.5°F | Resp 18

## 2020-12-31 DIAGNOSIS — Z5111 Encounter for antineoplastic chemotherapy: Secondary | ICD-10-CM | POA: Diagnosis not present

## 2020-12-31 DIAGNOSIS — Z171 Estrogen receptor negative status [ER-]: Secondary | ICD-10-CM

## 2020-12-31 DIAGNOSIS — C50411 Malignant neoplasm of upper-outer quadrant of right female breast: Secondary | ICD-10-CM

## 2020-12-31 MED ORDER — PEGFILGRASTIM-CBQV 6 MG/0.6ML ~~LOC~~ SOSY
6.0000 mg | PREFILLED_SYRINGE | Freq: Once | SUBCUTANEOUS | Status: AC
  Administered 2020-12-31: 6 mg via SUBCUTANEOUS
  Filled 2020-12-31: qty 0.6

## 2020-12-31 NOTE — Patient Instructions (Signed)

## 2021-01-01 ENCOUNTER — Other Ambulatory Visit: Payer: No Typology Code available for payment source

## 2021-01-04 NOTE — Progress Notes (Signed)
Patient Care Team: Pcp, No as PCP - General Mauro Kaufmann, RN as Oncology Nurse Navigator Rockwell Germany, RN as Oncology Nurse Navigator  DIAGNOSIS:    ICD-10-CM   1. Malignant neoplasm of upper-outer quadrant of right breast in female, estrogen receptor negative ([place])  C50.411    Z17.1       SUMMARY OF ONCOLOGIC HISTORY: Oncology History Overview Note  Invasive ductal carcinoma of the right breast  She palpated a mass in UOQ right breast. Diagnostic mammogram and [place] on 11/10/20 showed a spiculated mass in UOQ right breast with associated malignant type microcalcifications. 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     Malignant neoplasm of upper-outer quadrant of right breast in female, estrogen receptor negative ([place])  11/17/2020 Initial Diagnosis   Work-up performed at [facility]: Palpable right breast mass.  Mammogram and ultrasound 11/10/2020: Spiculated 4.9 cm mass UOQ right breast with microcalcifications, biopsy revealed IDC ER/PR negative, HER2 positive 3+ by IHC, axillary lymph node positive (2 lymph nodes were detected)   12/03/2020 PET scan   Right breast malignancy SUV 4.6, subcentimeter right level 1 and 2 axillary lymph nodes.  No distant metastatic disease.   12/21/2020 Cancer Staging   Staging form: Breast, AJCC 8th Edition - Clinical stage from 12/21/2020: Stage IIB (cT2, cN1, cM0, G3, ER-, PR-, HER2+) - Signed by Nicholas Lose, MD on 12/21/2020 Stage prefix: Initial diagnosis Histologic grading system: 3 grade system   12/29/2020 -  Chemotherapy    Patient is on Treatment Plan: BREAST  DOCETAXEL + CARBOPLATIN + TRASTUZUMAB + PERTUZUMAB  (TCHP) Q21D          CHIEF COMPLIANT: Cycle 2 TCH Perjeta  INTERVAL HISTORY: Marie Day is a 75 y.o. with above-mentioned history of invasive ductal carcinoma of the right breast, currently on  chemotherapy with [place]. She presents to the clinic today for cycle 2. she is reporting very mild nausea on day 3 which got better with nausea medication.  She did have 1 episode of loose stools per day.  She did take Imodium which appears to be helping.  She thinks she has aggravated her hemorrhoids.  Denies any bone pain.  She reports that the arthritis has completely resolved.  ALLERGIES:  is allergic to sulfa antibiotics.  MEDICATIONS:  Current Outpatient Medications  Medication Sig Dispense Refill   dexamethasone (DECADRON) 4 MG tablet Take 1 tablet (4 mg total) by mouth daily. Take 1 tablet day before chemo and 1 tablet day after chemo with food 12 tablet 0   lidocaine-prilocaine (EMLA) cream Apply to affected area once 30 g 3   ondansetron (ZOFRAN) 8 MG tablet Take 1 tablet (8 mg total) by mouth 2 (two) times daily as needed (Nausea or vomiting). Start on the third day after chemotherapy. 30 tablet 1   prochlorperazine (COMPAZINE) 10 MG tablet Take 1 tablet (10 mg total) by mouth every 6 (six) hours as needed (Nausea or vomiting). 30 tablet 1   No current facility-administered medications for this visit.    PHYSICAL EXAMINATION: ECOG PERFORMANCE STATUS: 1 - Symptomatic but completely ambulatory  Vitals:   01/05/21 1545  BP: 103/64  Resp: 18  Temp: 97.8 F (36.6 C)  SpO2: 100%   Filed Weights   01/05/21 1545  Weight: 161 lb 4.8 oz (73.2 kg)      LABORATORY DATA:  I have reviewed the data  as listed CMP Latest Ref Rng & Units 12/28/2020  Glucose 70 - 99 mg/dL 97  BUN 8 - 23 mg/dL 11  Creatinine 0.44 - 1.00 mg/dL 0.70  Sodium 135 - 145 mmol/L 141  Potassium 3.5 - 5.1 mmol/L 4.2  Chloride 98 - 111 mmol/L 104  CO2 22 - 32 mmol/L 26  Calcium 8.9 - 10.3 mg/dL 10.1  Total Protein 6.5 - 8.1 g/dL 7.5  Total Bilirubin 0.3 - 1.2 mg/dL 0.3  Alkaline Phos 38 - 126 U/L 116  AST 15 - 41 U/L 16  ALT 0 - 44 U/L 18    Lab Results  Component Value Date   WBC 18.2 (H)  01/05/2021   HGB 11.3 (L) 01/05/2021   HCT 34.8 (L) 01/05/2021   MCV 85.9 01/05/2021   PLT 305 01/05/2021   NEUTROABS PENDING 01/05/2021    ASSESSMENT & PLAN:  Malignant neoplasm of upper-outer quadrant of right breast in female, estrogen receptor negative ([place]) 11/17/2020:Work-up performed at [facility]: Palpable right breast mass.  Mammogram and ultrasound 11/10/2020: Spiculated 4.9 cm mass UOQ right breast with microcalcifications, biopsy revealed IDC ER/PR negative, HER2 positive 3+ by IHC, axillary lymph node positive (2 lymph nodes were detected)   Genetics were negative   Treatment Plan based on multidisciplinary tumor board: 1. Neoadjuvant chemotherapy with TCH Perjeta 6 cycles (started 12/29/2020) followed by Herceptin Perjeta maintenance versus Kadcyla maintenance (based on response to neoadjuvant chemo) for 1 year 2. Followed by breast conserving surgery if possible with targeted node surgery 3. Followed by adjuvant radiation therapy ------------------------------------------------------------------------------------------------------------------- Current Treatment: Cycle 1 day 8 TCHP Chemo Toxicities: Mild nausea on day 3 Mild intermittent diarrhea usually 1 loose stool per day responds to Imodium Fatigue  She reports that her arthritis symptoms have completely disappeared.   RTC in 2 weeks for Cycle 2    No orders of the defined types were placed in this encounter.  The patient has a good understanding of the overall plan. she agrees with it. she will call with any problems that may develop before the next visit here.  Total time spent: 30 mins including face to face time and time spent for planning, charting and coordination of care  Rulon Eisenmenger, MD, MPH 01/05/2021  I, Thana Ates, am acting as scribe for Dr. Nicholas Lose.  I have reviewed the above documentation for accuracy and completeness, and I agree with the above.

## 2021-01-05 ENCOUNTER — Other Ambulatory Visit: Payer: Self-pay

## 2021-01-05 ENCOUNTER — Encounter: Payer: Self-pay | Admitting: *Deleted

## 2021-01-05 ENCOUNTER — Inpatient Hospital Stay (HOSPITAL_BASED_OUTPATIENT_CLINIC_OR_DEPARTMENT_OTHER): Payer: Medicare (Managed Care) | Admitting: Hematology and Oncology

## 2021-01-05 ENCOUNTER — Other Ambulatory Visit: Payer: No Typology Code available for payment source

## 2021-01-05 ENCOUNTER — Ambulatory Visit: Payer: No Typology Code available for payment source | Admitting: Hematology and Oncology

## 2021-01-05 ENCOUNTER — Inpatient Hospital Stay: Payer: Medicare (Managed Care)

## 2021-01-05 DIAGNOSIS — Z171 Estrogen receptor negative status [ER-]: Secondary | ICD-10-CM

## 2021-01-05 DIAGNOSIS — C50411 Malignant neoplasm of upper-outer quadrant of right female breast: Secondary | ICD-10-CM | POA: Diagnosis not present

## 2021-01-05 DIAGNOSIS — Z95828 Presence of other vascular implants and grafts: Secondary | ICD-10-CM

## 2021-01-05 DIAGNOSIS — Z5111 Encounter for antineoplastic chemotherapy: Secondary | ICD-10-CM | POA: Diagnosis not present

## 2021-01-05 LAB — CBC WITH DIFFERENTIAL (CANCER CENTER ONLY)
Abs Immature Granulocytes: 1.8 10*3/uL — ABNORMAL HIGH (ref 0.00–0.07)
Basophils Absolute: 0 10*3/uL (ref 0.0–0.1)
Basophils Relative: 0 %
Eosinophils Absolute: 0.4 10*3/uL (ref 0.0–0.5)
Eosinophils Relative: 2 %
HCT: 34.8 % — ABNORMAL LOW (ref 36.0–46.0)
Hemoglobin: 11.3 g/dL — ABNORMAL LOW (ref 12.0–15.0)
Lymphocytes Relative: 36 %
Lymphs Abs: 6.6 10*3/uL — ABNORMAL HIGH (ref 0.7–4.0)
MCH: 27.9 pg (ref 26.0–34.0)
MCHC: 32.5 g/dL (ref 30.0–36.0)
MCV: 85.9 fL (ref 80.0–100.0)
Metamyelocytes Relative: 5 %
Monocytes Absolute: 2.7 10*3/uL — ABNORMAL HIGH (ref 0.1–1.0)
Monocytes Relative: 15 %
Myelocytes: 4 %
Neutro Abs: 6.7 10*3/uL (ref 1.7–7.7)
Neutrophils Relative %: 37 %
Platelet Count: 305 10*3/uL (ref 150–400)
Promyelocytes Relative: 1 %
RBC: 4.05 MIL/uL (ref 3.87–5.11)
RDW: 13.6 % (ref 11.5–15.5)
WBC Count: 18.2 10*3/uL — ABNORMAL HIGH (ref 4.0–10.5)
nRBC: 0.1 % (ref 0.0–0.2)

## 2021-01-05 LAB — CMP (CANCER CENTER ONLY)
ALT: 37 U/L (ref 0–44)
AST: 20 U/L (ref 15–41)
Albumin: 3.8 g/dL (ref 3.5–5.0)
Alkaline Phosphatase: 132 U/L — ABNORMAL HIGH (ref 38–126)
Anion gap: 12 (ref 5–15)
BUN: 10 mg/dL (ref 8–23)
CO2: 24 mmol/L (ref 22–32)
Calcium: 9.5 mg/dL (ref 8.9–10.3)
Chloride: 99 mmol/L (ref 98–111)
Creatinine: 1.07 mg/dL — ABNORMAL HIGH (ref 0.44–1.00)
GFR, Estimated: 55 mL/min — ABNORMAL LOW (ref >60)
Glucose, Bld: 128 mg/dL — ABNORMAL HIGH (ref 70–99)
Potassium: 3.9 mmol/L (ref 3.5–5.1)
Sodium: 135 mmol/L (ref 135–145)
Total Bilirubin: 0.2 mg/dL — ABNORMAL LOW (ref 0.3–1.2)
Total Protein: 7.1 g/dL (ref 6.5–8.1)

## 2021-01-05 MED ORDER — HEPARIN SOD (PORK) LOCK FLUSH 100 UNIT/ML IV SOLN
500.0000 [IU] | INTRAVENOUS | Status: AC | PRN
  Administered 2021-01-05: 500 [IU]

## 2021-01-05 MED ORDER — SODIUM CHLORIDE 0.9% FLUSH
10.0000 mL | INTRAVENOUS | Status: AC | PRN
  Administered 2021-01-05: 10 mL

## 2021-01-05 NOTE — Assessment & Plan Note (Signed)
11/17/2020:Work-up performed at [facility]: Palpable right breast mass. Mammogram and ultrasound 11/10/2020: Spiculated 4.9 cm mass UOQ right breast with microcalcifications, biopsy revealed IDC ER/PR negative, HER2 positive 3+ by IHC, axillary lymph node positive (2 lymph nodes were detected)  Genetics were negative  Treatment Plan based on multidisciplinary tumor board: 1. Neoadjuvant chemotherapy with TCH Perjeta 6 cycles (started 12/29/2020) followed by Herceptin Perjeta maintenance versus Kadcyla maintenance (based on response to neoadjuvant chemo) for 1 year 2. Followed by breast conserving surgery if possible withtargeted node surgery 3. Followed by adjuvant radiation therapy ------------------------------------------------------------------------------------------------------------------- Current Treatment: Cycle 1 day 8 TCHP Chemo Toxicities:  RTC in 2 weeks for Cycle 2

## 2021-01-06 ENCOUNTER — Telehealth: Payer: Self-pay | Admitting: *Deleted

## 2021-01-06 NOTE — Telephone Encounter (Signed)
Received vm from pt stating she had some questions that she didn't address with Dr Lindi Adie yest.  Returned call & pt reports nausea is under control but she has had some diarrhea accidents.  She had one during the night & twice today so far.  She has taken imodium.  She had questions about how much imodium she could take.  Informed that if she has another loose watery BM she could take 2 imodium & then 1 after each loose stool up to 8/day/24 hr.  We discussed diet, clear liquids, BRAT diet, decrease fiber, etc.  She expressed understanding.  She also asked if her immune system was still low.  Informed that her WBC/ANC is artificially elevated from the pegfilgrastim shot that she received.  She should continue to be careful the whole time she is on treatment.  She expressed understanding on this also.  Informed to call us if diarrhea is not improved & not able to get fluids in b/c of concern of dehydration.

## 2021-01-07 ENCOUNTER — Telehealth: Payer: Self-pay | Admitting: *Deleted

## 2021-01-07 ENCOUNTER — Other Ambulatory Visit: Payer: Self-pay | Admitting: Hematology and Oncology

## 2021-01-07 DIAGNOSIS — C50411 Malignant neoplasm of upper-outer quadrant of right female breast: Secondary | ICD-10-CM

## 2021-01-07 DIAGNOSIS — Z171 Estrogen receptor negative status [ER-]: Secondary | ICD-10-CM

## 2021-01-07 NOTE — Telephone Encounter (Signed)
Spoke to pt regarding breast MRI results and recommendations for MRI bx of right breast. Received verbal understanding.

## 2021-01-07 NOTE — Telephone Encounter (Signed)
Left vm regarding MRI and need to further discuss recommendations of MRI bx. Contact information provided for return call.

## 2021-01-11 ENCOUNTER — Encounter: Payer: Self-pay | Admitting: *Deleted

## 2021-01-11 ENCOUNTER — Encounter: Payer: Self-pay | Admitting: Hematology and Oncology

## 2021-01-14 ENCOUNTER — Encounter: Payer: Self-pay | Admitting: *Deleted

## 2021-01-15 ENCOUNTER — Ambulatory Visit
Admission: RE | Admit: 2021-01-15 | Discharge: 2021-01-15 | Disposition: A | Discharge status: Home / Self Care | Payer: Medicare (Managed Care) | Source: Ambulatory Visit | Attending: Adult Health | Admitting: Adult Health

## 2021-01-15 ENCOUNTER — Ambulatory Visit
Admission: RE | Admit: 2021-01-15 | Discharge: 2021-01-15 | Disposition: A | Discharge status: Home / Self Care | Payer: Medicare (Managed Care) | Source: Ambulatory Visit | Attending: Hematology and Oncology | Admitting: Hematology and Oncology

## 2021-01-15 ENCOUNTER — Other Ambulatory Visit: Payer: Self-pay | Admitting: Hematology and Oncology

## 2021-01-15 ENCOUNTER — Encounter: Payer: Self-pay | Admitting: Hematology and Oncology

## 2021-01-15 ENCOUNTER — Other Ambulatory Visit: Payer: Self-pay

## 2021-01-15 DIAGNOSIS — Z171 Estrogen receptor negative status [ER-]: Secondary | ICD-10-CM

## 2021-01-15 DIAGNOSIS — C50411 Malignant neoplasm of upper-outer quadrant of right female breast: Secondary | ICD-10-CM

## 2021-01-16 NOTE — Progress Notes (Signed)
Patient Care Team: Pcp, No as PCP - General Mauro Kaufmann, RN as Oncology Nurse Navigator Rockwell Germany, RN as Oncology Nurse Navigator  DIAGNOSIS:    ICD-10-CM   1. Malignant neoplasm of upper-outer quadrant of right breast in female, estrogen receptor negative ([place])  C50.411    Z17.1       SUMMARY OF ONCOLOGIC HISTORY: Oncology History Overview Note  Invasive ductal carcinoma of the right breast  She palpated a mass in UOQ right breast. Diagnostic mammogram and [place] on 11/10/20 showed a spiculated mass in UOQ right breast with associated malignant type microcalcifications. 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     Malignant neoplasm of upper-outer quadrant of right breast in female, estrogen receptor negative ([place])  11/17/2020 Initial Diagnosis   Work-up performed at [facility]: Palpable right breast mass.  Mammogram and ultrasound 11/10/2020: Spiculated 4.9 cm mass UOQ right breast with microcalcifications, biopsy revealed IDC ER/PR negative, HER2 positive 3+ by IHC, axillary lymph node positive (2 lymph nodes were detected)   12/03/2020 PET scan   Right breast malignancy SUV 4.6, subcentimeter right level 1 and 2 axillary lymph nodes.  No distant metastatic disease.   12/21/2020 Cancer Staging   Staging form: Breast, AJCC 8th Edition - Clinical stage from 12/21/2020: Stage IIB (cT2, cN1, cM0, G3, ER-, PR-, HER2+) - Signed by Nicholas Lose, MD on 12/21/2020 Stage prefix: Initial diagnosis Histologic grading system: 3 grade system   12/29/2020 -  Chemotherapy    Patient is on Treatment Plan: BREAST  DOCETAXEL + CARBOPLATIN + TRASTUZUMAB + PERTUZUMAB  (TCHP) Q21D          CHIEF COMPLIANT: Cycle 2 TCH Perjeta  INTERVAL HISTORY: Marie Day is a 75 y.o. with above-mentioned history of invasive ductal carcinoma of the right breast, currently on  chemotherapy with [place]. She presents to the clinic today for cycle 2. arthritis symptoms have improved after taking steroid yesterday.  However the past week she was hurting a lot.  Diarrhea comes on and off and she has to take Imodium as preventative and it appears to be working for her.  She continues to have mild nausea.  She takes Compazine which appears to be helping.  ALLERGIES:  is allergic to sulfa antibiotics.  MEDICATIONS:  Current Outpatient Medications  Medication Sig Dispense Refill   dexamethasone (DECADRON) 4 MG tablet Take 1 tablet (4 mg total) by mouth daily. Take 1 tablet day before chemo and 1 tablet day after chemo with food 12 tablet 0   lidocaine-prilocaine (EMLA) cream Apply to affected area once 30 g 3   ondansetron (ZOFRAN) 8 MG tablet Take 1 tablet (8 mg total) by mouth 2 (two) times daily as needed (Nausea or vomiting). Start on the third day after chemotherapy. 30 tablet 1   prochlorperazine (COMPAZINE) 10 MG tablet Take 1 tablet (10 mg total) by mouth every 6 (six) hours as needed (Nausea or vomiting). 30 tablet 1   No current facility-administered medications for this visit.    PHYSICAL EXAMINATION: ECOG PERFORMANCE STATUS: 1 - Symptomatic but completely ambulatory  Vitals:   01/18/21 0856  BP: (!) 155/69  Pulse: 84  Resp: 18  Temp: (!) 97.2 F (36.2 C)  SpO2: 98%   Filed Weights   01/18/21 0856  Weight: 159 lb 1.6 oz (72.2 kg)    LABORATORY DATA:  I have reviewed the  data as listed CMP Latest Ref Rng & Units 01/05/2021 12/28/2020  Glucose 70 - 99 mg/dL 128(H) 97  BUN 8 - 23 mg/dL 10 11  Creatinine 0.44 - 1.00 mg/dL 1.07(H) 0.70  Sodium 135 - 145 mmol/L 135 141  Potassium 3.5 - 5.1 mmol/L 3.9 4.2  Chloride 98 - 111 mmol/L 99 104  CO2 22 - 32 mmol/L 24 26  Calcium 8.9 - 10.3 mg/dL 9.5 10.1  Total Protein 6.5 - 8.1 g/dL 7.1 7.5  Total Bilirubin 0.3 - 1.2 mg/dL 0.2(L) 0.3  Alkaline Phos 38 - 126 U/L 132(H) 116  AST 15 - 41 U/L 20 16  ALT 0  - 44 U/L 37 18    Lab Results  Component Value Date   WBC 12.5 (H) 01/18/2021   HGB 10.2 (L) 01/18/2021   HCT 31.2 (L) 01/18/2021   MCV 86.0 01/18/2021   PLT 499 (H) 01/18/2021   NEUTROABS 7.5 01/18/2021    ASSESSMENT & PLAN:  Malignant neoplasm of upper-outer quadrant of right breast in female, estrogen receptor negative ([place]) 11/17/2020:Work-up performed at [facility]: Palpable right breast mass.  Mammogram and ultrasound 11/10/2020: Spiculated 4.9 cm mass UOQ right breast with microcalcifications, biopsy revealed IDC ER/PR negative, HER2 positive 3+ by IHC, axillary lymph node positive (2 lymph nodes were detected)   Genetics were negative   Treatment Plan based on multidisciplinary tumor board: 1. Neoadjuvant chemotherapy with TCH Perjeta 6 cycles (started 12/29/2020) followed by Herceptin Perjeta maintenance versus Kadcyla maintenance (based on response to neoadjuvant chemo) for 1 year 2. Followed by breast conserving surgery if possible with targeted node surgery 3. Followed by adjuvant radiation therapy  Breast MRI 01/05/2021: Large area of non-mass enhancement 7 cm right breast UOQ.  Non-mass enhancement 2.5 cm central breast, third area of suspicious non-mass enhancement anterior UOQ 1.7 cm: MRI guided biopsy of non-mass enhancement in the central and anterolateral right breast 01/20/2021.  Intramammary lymph node Second Look ultrasound: Benign ------------------------------------------------------------------------------------------------------------------- Current Treatment: Cycle 2 TCHP Chemo Toxicities: Mild nausea on day 3 Mild intermittent diarrhea usually 1 loose stool per day responds to Imodium Fatigue   She reports that her arthritis symptoms have completely disappeared.  However they came back over the past week.  It appears that she is responding to steroids very well for the arthritis. MRI guided biopsy scheduled for 01/20/2021 for the non-mass enhancement in  the central and anterolateral breast.  I discussed with her that if the additional biopsies are positive then she might need a mastectomy  RTC in 3 weeks for Cycle 3    No orders of the defined types were placed in this encounter.  The patient has a good understanding of the overall plan. she agrees with it. she will call with any problems that may develop before the next visit here.  Total time spent: 30 mins including face to face time and time spent for planning, charting and coordination of care  Rulon Eisenmenger, MD, MPH 01/18/2021  I, Thana Ates, am acting as scribe for Dr. Nicholas Lose.  I have reviewed the above documentation for accuracy and completeness, and I agree with the above.

## 2021-01-18 ENCOUNTER — Inpatient Hospital Stay (HOSPITAL_BASED_OUTPATIENT_CLINIC_OR_DEPARTMENT_OTHER): Payer: Medicare (Managed Care) | Admitting: Hematology and Oncology

## 2021-01-18 ENCOUNTER — Other Ambulatory Visit: Payer: Self-pay

## 2021-01-18 ENCOUNTER — Inpatient Hospital Stay: Payer: Medicare (Managed Care)

## 2021-01-18 ENCOUNTER — Encounter: Payer: Self-pay | Admitting: *Deleted

## 2021-01-18 DIAGNOSIS — Z95828 Presence of other vascular implants and grafts: Secondary | ICD-10-CM | POA: Insufficient documentation

## 2021-01-18 DIAGNOSIS — Z171 Estrogen receptor negative status [ER-]: Secondary | ICD-10-CM

## 2021-01-18 DIAGNOSIS — C50411 Malignant neoplasm of upper-outer quadrant of right female breast: Secondary | ICD-10-CM

## 2021-01-18 DIAGNOSIS — Z5111 Encounter for antineoplastic chemotherapy: Secondary | ICD-10-CM | POA: Diagnosis not present

## 2021-01-18 LAB — CBC WITH DIFFERENTIAL (CANCER CENTER ONLY)
Abs Immature Granulocytes: 0.12 10*3/uL — ABNORMAL HIGH (ref 0.00–0.07)
Basophils Absolute: 0.1 10*3/uL (ref 0.0–0.1)
Basophils Relative: 1 %
Eosinophils Absolute: 0 10*3/uL (ref 0.0–0.5)
Eosinophils Relative: 0 %
HCT: 31.2 % — ABNORMAL LOW (ref 36.0–46.0)
Hemoglobin: 10.2 g/dL — ABNORMAL LOW (ref 12.0–15.0)
Immature Granulocytes: 1 %
Lymphocytes Relative: 30 %
Lymphs Abs: 3.8 10*3/uL (ref 0.7–4.0)
MCH: 28.1 pg (ref 26.0–34.0)
MCHC: 32.7 g/dL (ref 30.0–36.0)
MCV: 86 fL (ref 80.0–100.0)
Monocytes Absolute: 1.1 10*3/uL — ABNORMAL HIGH (ref 0.1–1.0)
Monocytes Relative: 8 %
Neutro Abs: 7.5 10*3/uL (ref 1.7–7.7)
Neutrophils Relative %: 60 %
Platelet Count: 499 10*3/uL — ABNORMAL HIGH (ref 150–400)
RBC: 3.63 MIL/uL — ABNORMAL LOW (ref 3.87–5.11)
RDW: 14.5 % (ref 11.5–15.5)
WBC Count: 12.5 10*3/uL — ABNORMAL HIGH (ref 4.0–10.5)
nRBC: 0 % (ref 0.0–0.2)

## 2021-01-18 LAB — CMP (CANCER CENTER ONLY)
ALT: 23 U/L (ref 0–44)
AST: 15 U/L (ref 15–41)
Albumin: 3.7 g/dL (ref 3.5–5.0)
Alkaline Phosphatase: 100 U/L (ref 38–126)
Anion gap: 12 (ref 5–15)
BUN: 8 mg/dL (ref 8–23)
CO2: 25 mmol/L (ref 22–32)
Calcium: 9.7 mg/dL (ref 8.9–10.3)
Chloride: 106 mmol/L (ref 98–111)
Creatinine: 0.69 mg/dL (ref 0.44–1.00)
GFR, Estimated: >60 mL/min (ref >60)
Glucose, Bld: 109 mg/dL — ABNORMAL HIGH (ref 70–99)
Potassium: 3.3 mmol/L — ABNORMAL LOW (ref 3.5–5.1)
Sodium: 143 mmol/L (ref 135–145)
Total Bilirubin: 0.2 mg/dL — ABNORMAL LOW (ref 0.3–1.2)
Total Protein: 7.1 g/dL (ref 6.5–8.1)

## 2021-01-18 MED ORDER — PALONOSETRON HCL INJECTION 0.25 MG/5ML
0.2500 mg | Freq: Once | INTRAVENOUS | Status: AC
  Administered 2021-01-18: 0.25 mg via INTRAVENOUS
  Filled 2021-01-18: qty 5

## 2021-01-18 MED ORDER — DIPHENHYDRAMINE HCL 25 MG PO CAPS
50.0000 mg | ORAL_CAPSULE | Freq: Once | ORAL | Status: AC
  Administered 2021-01-18: 50 mg via ORAL
  Filled 2021-01-18: qty 2

## 2021-01-18 MED ORDER — SODIUM CHLORIDE 0.9 % IV SOLN
500.0000 mg | Freq: Once | INTRAVENOUS | Status: AC
  Administered 2021-01-18: 500 mg via INTRAVENOUS
  Filled 2021-01-18: qty 50

## 2021-01-18 MED ORDER — SODIUM CHLORIDE 0.9% FLUSH
10.0000 mL | Freq: Once | INTRAVENOUS | Status: AC
  Administered 2021-01-18: 10 mL

## 2021-01-18 MED ORDER — SODIUM CHLORIDE 0.9 % IV SOLN
75.0000 mg/m2 | Freq: Once | INTRAVENOUS | Status: AC
  Administered 2021-01-18: 140 mg via INTRAVENOUS
  Filled 2021-01-18: qty 14

## 2021-01-18 MED ORDER — SODIUM CHLORIDE 0.9 % IV SOLN: Freq: Once | INTRAVENOUS | Status: AC

## 2021-01-18 MED ORDER — SODIUM CHLORIDE 0.9 % IV SOLN
10.0000 mg | Freq: Once | INTRAVENOUS | Status: AC
  Administered 2021-01-18: 10 mg via INTRAVENOUS
  Filled 2021-01-18: qty 10

## 2021-01-18 MED ORDER — HEPARIN SOD (PORK) LOCK FLUSH 100 UNIT/ML IV SOLN
500.0000 [IU] | Freq: Once | INTRAVENOUS | Status: AC | PRN
  Administered 2021-01-18: 500 [IU]

## 2021-01-18 MED ORDER — TRASTUZUMAB-DKST CHEMO 150 MG IV SOLR
6.0000 mg/kg | Freq: Once | INTRAVENOUS | Status: AC
  Administered 2021-01-18: 441 mg via INTRAVENOUS
  Filled 2021-01-18: qty 21

## 2021-01-18 MED ORDER — SODIUM CHLORIDE 0.9 % IV SOLN
150.0000 mg | Freq: Once | INTRAVENOUS | Status: AC
  Administered 2021-01-18: 150 mg via INTRAVENOUS
  Filled 2021-01-18: qty 150

## 2021-01-18 MED ORDER — SODIUM CHLORIDE 0.9 % IV SOLN
420.0000 mg | Freq: Once | INTRAVENOUS | Status: AC
  Administered 2021-01-18: 420 mg via INTRAVENOUS
  Filled 2021-01-18: qty 14

## 2021-01-18 MED ORDER — ACETAMINOPHEN 325 MG PO TABS
650.0000 mg | ORAL_TABLET | Freq: Once | ORAL | Status: AC
  Administered 2021-01-18: 650 mg via ORAL
  Filled 2021-01-18: qty 2

## 2021-01-18 MED ORDER — SODIUM CHLORIDE 0.9% FLUSH
10.0000 mL | INTRAVENOUS | Status: DC | PRN
  Administered 2021-01-18: 10 mL

## 2021-01-18 NOTE — Patient Instructions (Signed)
[facility] ONCOLOGY   Discharge Instructions: Thank you for choosing [facility] to provide your oncology and hematology care.   If you have a lab appointment with the [facility], please go directly to the [facility] and check in at the registration area.   Wear comfortable clothing and clothing appropriate for easy access to any Portacath or PICC line.   We strive to give you quality time with your provider. You may need to reschedule your appointment if you arrive late (15 or more minutes).  Arriving late affects you and other patients whose appointments are after yours.  Also, if you miss three or more appointments without notifying the office, you may be dismissed from the clinic at the provider's discretion.      For prescription refill requests, have your pharmacy contact our office and allow 72 hours for refills to be completed.    Today you received the following chemotherapy and/or immunotherapy agents: Trastuzumab (Herceptin), Pertuzumab (Perjeta), Docetaxel (Taxotere), and Carboplatin      To help prevent nausea and vomiting after your treatment, we encourage you to take your nausea medication as directed.  BELOW ARE SYMPTOMS THAT SHOULD BE REPORTED IMMEDIATELY: *FEVER GREATER THAN 100.4 F (38 C) OR HIGHER *CHILLS OR SWEATING *NAUSEA AND VOMITING THAT IS NOT CONTROLLED WITH YOUR NAUSEA MEDICATION *UNUSUAL SHORTNESS OF BREATH *UNUSUAL BRUISING OR BLEEDING *URINARY PROBLEMS (pain or burning when urinating, or frequent urination) *BOWEL PROBLEMS (unusual diarrhea, constipation, pain near the anus) TENDERNESS IN MOUTH AND THROAT WITH OR WITHOUT PRESENCE OF ULCERS (sore throat, sores in mouth, or a toothache) UNUSUAL RASH, SWELLING OR PAIN  UNUSUAL VAGINAL DISCHARGE OR ITCHING   Items with * indicate a potential emergency and should be followed up as soon as possible or go to the Emergency Department if any problems should occur.  Please  show the CHEMOTHERAPY ALERT CARD or IMMUNOTHERAPY ALERT CARD at check-in to the Emergency Department and triage nurse.  Should you have questions after your visit or need to cancel or reschedule your appointment, please contact [facility]  Dept: [phone]  and follow the prompts.  Office hours are 8:00 a.m. to 4:30 p.m. Monday - Friday. Please note that voicemails left after 4:00 p.m. may not be returned until the following business day.  We are closed weekends and major holidays. You have access to a nurse at all times for urgent questions. Please call the main number to the clinic Dept: [phone] and follow the prompts.   For any non-urgent questions, you may also contact your provider using MyChart. We now offer e-Visits for anyone 90 and older to request care online for non-urgent symptoms. For details visit [email or web address].   Also download the MyChart app! Go to the app store, search "MyChart", open the app, select [facility], and log in with your MyChart username and password.  Due to Covid, a mask is required upon entering the hospital/clinic. If you do not have a mask, one will be given to you upon arrival. For doctor visits, patients may have 1 support person aged 37 or older with them. For treatment visits, patients cannot have anyone with them due to current Covid guidelines and our immunocompromised population.

## 2021-01-18 NOTE — Assessment & Plan Note (Signed)
11/17/2020:Work-up performed at [facility]: Palpable right breast mass. Mammogram and ultrasound 11/10/2020: Spiculated 4.9 cm mass UOQ right breast with microcalcifications, biopsy revealed IDC ER/PR negative, HER2 positive 3+ by IHC, axillary lymph node positive (2 lymph nodes were detected)  Genetics were negative  Treatment Planbased on multidisciplinary tumor board: 1. Neoadjuvant chemotherapy with [place] Perjeta 6 cycles(started 12/29/2020)followed by Herceptin Perjeta maintenance versus Kadcyla maintenance (based on response to neoadjuvant chemo) for 1 year 2. Followed by breast conserving surgery if possible withtargeted node surgery 3. Followed by adjuvant radiation therapy  Breast MRI 01/05/2021: Large area of non-mass enhancement 7 cm right breast UOQ.  Non-mass enhancement 2.5 cm central breast, third area of suspicious non-mass enhancement anterior UOQ 1.7 cm: MRI guided biopsy of non-mass enhancement in the central and anterolateral right breast 01/20/2021.  Intramammary lymph node Second Look ultrasound: Benign ------------------------------------------------------------------------------------------------------------------- Current Treatment: Cycle 2 TCHP Chemo Toxicities: 1. Mild nausea on day 3 2. Mild intermittent diarrhea usually 1 loose stool per day responds to Imodium 3. Fatigue  She reports that her arthritis symptoms have completely disappeared. MRI guided biopsy scheduled for 01/20/2021 for the non-mass enhancement in the central and anterolateral breast. RTC in 3 weeks for Cycle 3

## 2021-01-19 ENCOUNTER — Telehealth: Payer: Self-pay | Admitting: Hematology and Oncology

## 2021-01-19 NOTE — Telephone Encounter (Signed)
NO los 8/29

## 2021-01-20 ENCOUNTER — Inpatient Hospital Stay: Payer: Medicare (Managed Care)

## 2021-01-20 ENCOUNTER — Other Ambulatory Visit: Payer: Self-pay

## 2021-01-20 ENCOUNTER — Other Ambulatory Visit: Payer: Medicare (Managed Care)

## 2021-01-20 ENCOUNTER — Other Ambulatory Visit: Payer: Self-pay | Admitting: Hematology and Oncology

## 2021-01-20 DIAGNOSIS — Z171 Estrogen receptor negative status [ER-]: Secondary | ICD-10-CM

## 2021-01-20 DIAGNOSIS — Z5111 Encounter for antineoplastic chemotherapy: Secondary | ICD-10-CM | POA: Diagnosis not present

## 2021-01-20 MED ORDER — PEGFILGRASTIM-CBQV 6 MG/0.6ML ~~LOC~~ SOSY
6.0000 mg | PREFILLED_SYRINGE | Freq: Once | SUBCUTANEOUS | Status: AC
  Administered 2021-01-20: 6 mg via SUBCUTANEOUS
  Filled 2021-01-20: qty 0.6

## 2021-01-21 ENCOUNTER — Encounter: Payer: Self-pay | Admitting: *Deleted

## 2021-01-21 ENCOUNTER — Encounter: Payer: Self-pay | Admitting: Hematology and Oncology

## 2021-01-28 ENCOUNTER — Other Ambulatory Visit: Payer: Self-pay

## 2021-01-28 ENCOUNTER — Ambulatory Visit
Admission: RE | Admit: 2021-01-28 | Discharge: 2021-01-28 | Disposition: A | Discharge status: Home / Self Care | Payer: Medicare Other | Source: Ambulatory Visit | Attending: Adult Health | Admitting: Adult Health

## 2021-01-28 ENCOUNTER — Encounter: Payer: Self-pay | Admitting: Hematology and Oncology

## 2021-01-28 ENCOUNTER — Ambulatory Visit
Admission: RE | Admit: 2021-01-28 | Discharge: 2021-01-28 | Disposition: A | Discharge status: Home / Self Care | Payer: Medicare Other | Source: Ambulatory Visit | Attending: Hematology and Oncology | Admitting: Hematology and Oncology

## 2021-01-28 DIAGNOSIS — Z171 Estrogen receptor negative status [ER-]: Secondary | ICD-10-CM

## 2021-01-28 DIAGNOSIS — C50411 Malignant neoplasm of upper-outer quadrant of right female breast: Secondary | ICD-10-CM

## 2021-01-28 MED ORDER — GADOBUTROL 1 MMOL/ML IV SOLN: 6.0000 mL | Freq: Once | INTRAVENOUS | Status: DC | PRN

## 2021-01-29 ENCOUNTER — Encounter: Payer: Self-pay | Admitting: *Deleted

## 2021-02-01 ENCOUNTER — Encounter: Payer: Self-pay | Admitting: *Deleted

## 2021-02-02 ENCOUNTER — Other Ambulatory Visit: Payer: Self-pay | Admitting: Hematology and Oncology

## 2021-02-02 DIAGNOSIS — Z171 Estrogen receptor negative status [ER-]: Secondary | ICD-10-CM

## 2021-02-02 DIAGNOSIS — C50411 Malignant neoplasm of upper-outer quadrant of right female breast: Secondary | ICD-10-CM

## 2021-02-04 ENCOUNTER — Other Ambulatory Visit: Payer: Medicare (Managed Care)

## 2021-02-05 MED FILL — Dexamethasone Sodium Phosphate Inj 100 MG/10ML: INTRAMUSCULAR | Qty: 1 | Status: AC

## 2021-02-05 MED FILL — Fosaprepitant Dimeglumine For IV Infusion 150 MG (Base Eq): INTRAVENOUS | Qty: 5 | Status: AC

## 2021-02-06 NOTE — Progress Notes (Signed)
Patient Care Team: Pcp, No as PCP - General Mauro Kaufmann, RN as Oncology Nurse Navigator Rockwell Germany, RN as Oncology Nurse Navigator  DIAGNOSIS:    ICD-10-CM   1. Malignant neoplasm of upper-outer quadrant of right breast in female, estrogen receptor negative ([place])  C50.411    Z17.1       SUMMARY OF ONCOLOGIC HISTORY: Oncology History  Malignant neoplasm of upper-outer quadrant of right breast in female, estrogen receptor negative ([place])  11/17/2020 Initial Diagnosis   Work-up performed at [facility]: Palpable right breast mass.  Mammogram and ultrasound 11/10/2020: Spiculated 4.9 cm mass UOQ right breast with microcalcifications, biopsy revealed IDC ER/PR negative, HER2 positive 3+ by IHC, axillary lymph node positive (2 lymph nodes were detected)   12/03/2020 PET scan   Right breast malignancy SUV 4.6, subcentimeter right level 1 and 2 axillary lymph nodes.  No distant metastatic disease.   12/21/2020 Cancer Staging   Staging form: Breast, AJCC 8th Edition - Clinical stage from 12/21/2020: Stage IIB (cT2, cN1, cM0, G3, ER-, PR-, HER2+) - Signed by Nicholas Lose, MD on 12/21/2020 Stage prefix: Initial diagnosis Histologic grading system: 3 grade system   12/29/2020 -  Chemotherapy    Patient is on Treatment Plan: BREAST  DOCETAXEL + CARBOPLATIN + TRASTUZUMAB + PERTUZUMAB  (TCHP) Q21D          CHIEF COMPLIANT: Cycle 3 TCH Perjeta  INTERVAL HISTORY: Marie Day is a 75 y.o. with above-mentioned history of invasive ductal carcinoma of the right breast, currently on chemotherapy with [place]. She presents to the clinic today for cycle 3.  Overall she is tolerating treatment reasonably well.  She does have daily diarrhea once a day.  She feels tired.  Does not have an appetite and therefore it is difficult for her to eat.  Has mild nausea on a chronic basis.  ALLERGIES:  is allergic to sulfa antibiotics.  MEDICATIONS:  Current Outpatient Medications  Medication  Sig Dispense Refill   dexamethasone (DECADRON) 4 MG tablet Take 1 tablet (4 mg total) by mouth daily. Take 1 tablet day before chemo and 1 tablet day after chemo with food 12 tablet 0   lidocaine-prilocaine (EMLA) cream Apply to affected area once 30 g 3   ondansetron (ZOFRAN) 8 MG tablet Take 1 tablet (8 mg total) by mouth 2 (two) times daily as needed (Nausea or vomiting). Start on the third day after chemotherapy. 30 tablet 1   prochlorperazine (COMPAZINE) 10 MG tablet TAKE 1 TABLET (10 MG TOTAL) BY MOUTH EVERY 6 (SIX) HOURS AS NEEDED (NAUSEA OR VOMITING). 30 tablet 1   No current facility-administered medications for this visit.   Facility-Administered Medications Ordered in Other Visits  Medication Dose Route Frequency Provider Last Rate Last Admin   acetaminophen (TYLENOL) tablet 650 mg  650 mg Oral Once Nicholas Lose, MD       CARBOplatin (PARAPLATIN) 500 mg in sodium chloride 0.9 % 250 mL chemo infusion  500 mg Intravenous Once Nicholas Lose, MD       dexamethasone (DECADRON) 10 mg in sodium chloride 0.9 % 50 mL IVPB  10 mg Intravenous Once Nicholas Lose, MD       diphenhydrAMINE (BENADRYL) capsule 50 mg  50 mg Oral Once Nicholas Lose, MD       DOCEtaxel (TAXOTERE) 140 mg in sodium chloride 0.9 % 250 mL chemo infusion  75 mg/m2 (Order-Specific) Intravenous Once Nicholas Lose, MD       fosaprepitant (EMEND) 150  mg in sodium chloride 0.9 % 145 mL IVPB  150 mg Intravenous Once Nicholas Lose, MD       palonosetron (ALOXI) injection 0.25 mg  0.25 mg Intravenous Once Nicholas Lose, MD       pertuzumab (PERJETA) 420 mg in sodium chloride 0.9 % 250 mL chemo infusion  420 mg Intravenous Once Nicholas Lose, MD       trastuzumab-dkst (OGIVRI) 441 mg in sodium chloride 0.9 % 250 mL chemo infusion  6 mg/kg (Order-Specific) Intravenous Once Nicholas Lose, MD        PHYSICAL EXAMINATION: ECOG PERFORMANCE STATUS: 1 - Symptomatic but completely ambulatory  Vitals:   02/08/21 1035  BP: (!) 144/74   Pulse: 80  Resp: 18  Temp: (!) 97.3 F (36.3 C)  SpO2: 97%   Filed Weights   02/08/21 1035  Weight: 157 lb 6.4 oz (71.4 kg)    LABORATORY DATA:  I have reviewed the data as listed CMP Latest Ref Rng & Units 02/08/2021 01/18/2021 01/05/2021  Glucose 70 - 99 mg/dL 100(H) 109(H) 128(H)  BUN 8 - 23 mg/dL $Remove'9 8 10  'ZTcvyku$ Creatinine 0.44 - 1.00 mg/dL 0.64 0.69 1.07(H)  Sodium 135 - 145 mmol/L 143 143 135  Potassium 3.5 - 5.1 mmol/L 3.4(L) 3.3(L) 3.9  Chloride 98 - 111 mmol/L 107 106 99  CO2 22 - 32 mmol/L $RemoveB'26 25 24  'ApYBEyTh$ Calcium 8.9 - 10.3 mg/dL 9.7 9.7 9.5  Total Protein 6.5 - 8.1 g/dL 6.8 7.1 7.1  Total Bilirubin 0.3 - 1.2 mg/dL 0.3 0.2(L) 0.2(L)  Alkaline Phos 38 - 126 U/L 96 100 132(H)  AST 15 - 41 U/L $Remo'15 15 20  'vUrhK$ ALT 0 - 44 U/L 20 23 37    Lab Results  Component Value Date   WBC 9.3 02/08/2021   HGB 10.2 (L) 02/08/2021   HCT 31.5 (L) 02/08/2021   MCV 87.5 02/08/2021   PLT 271 02/08/2021   NEUTROABS 5.3 02/08/2021    ASSESSMENT & PLAN:  Malignant neoplasm of upper-outer quadrant of right breast in female, estrogen receptor negative ([place]) 11/17/2020:Work-up performed at [facility]: Palpable right breast mass.  Mammogram and ultrasound 11/10/2020: Spiculated 4.9 cm mass UOQ right breast with microcalcifications, biopsy revealed IDC ER/PR negative, HER2 positive 3+ by IHC, axillary lymph node positive (2 lymph nodes were detected)   Genetics were negative   Treatment Plan based on multidisciplinary tumor board: 1. Neoadjuvant chemotherapy with TCH Perjeta 6 cycles (started 12/29/2020) followed by Herceptin Perjeta maintenance versus Kadcyla maintenance (based on response to neoadjuvant chemo) for 1 year 2. mastectomy with targeted node surgery (because of the additional positive biopsies) 3. Followed by adjuvant radiation therapy   Breast MRI 01/05/2021: Large area of non-mass enhancement 7 cm right breast UOQ.  Non-mass enhancement 2.5 cm central breast, third area of suspicious  non-mass enhancement anterior UOQ 1.7 cm: MRI guided biopsy of non-mass enhancement in the central and anterolateral right breast 01/20/2021.  Intramammary lymph node Second Look ultrasound: Benign ------------------------------------------------------------------------------------------------------------------- Current Treatment: Cycle 3 TCHP Chemo Toxicities: Mild nausea: Could be related to gastritis.  I instructed her to take over-the-counter Prilosec. Mild intermittent diarrhea usually 1 loose stool per day responds to Imodium Fatigue Neuropathy: Monitoring closely.  It is no different than before.  She is going to use ice today.   She reports that her arthritis symptoms have completely disappeared for a couple of weeks after chemo and then they come back the week before the new treatment. MRI guided biopsy 01/28/2021: Right  breast biopsy: IDC with DCIS, right breast biopsy outer anterior: DCIS  This could mean that she might need a mastectomy at the end of the neoadjuvant chemotherapy.   RTC in 3 weeks for Cycle 3    No orders of the defined types were placed in this encounter.  The patient has a good understanding of the overall plan. she agrees with it. she will call with any problems that may develop before the next visit here.  Total time spent: 30 mins including face to face time and time spent for planning, charting and coordination of care  Rulon Eisenmenger, MD, MPH 02/08/2021  I, Thana Ates, am acting as scribe for Dr. Nicholas Lose.  I have reviewed the above documentation for accuracy and completeness, and I agree with the above.

## 2021-02-08 ENCOUNTER — Other Ambulatory Visit: Payer: Self-pay

## 2021-02-08 ENCOUNTER — Inpatient Hospital Stay: Payer: Medicare Other

## 2021-02-08 ENCOUNTER — Inpatient Hospital Stay (HOSPITAL_BASED_OUTPATIENT_CLINIC_OR_DEPARTMENT_OTHER): Payer: Medicare Other | Admitting: Hematology and Oncology

## 2021-02-08 ENCOUNTER — Inpatient Hospital Stay: Payer: Medicare Other | Attending: Hematology and Oncology

## 2021-02-08 DIAGNOSIS — C50411 Malignant neoplasm of upper-outer quadrant of right female breast: Secondary | ICD-10-CM

## 2021-02-08 DIAGNOSIS — Z5112 Encounter for antineoplastic immunotherapy: Secondary | ICD-10-CM | POA: Insufficient documentation

## 2021-02-08 DIAGNOSIS — Z171 Estrogen receptor negative status [ER-]: Secondary | ICD-10-CM | POA: Diagnosis not present

## 2021-02-08 DIAGNOSIS — Z5111 Encounter for antineoplastic chemotherapy: Secondary | ICD-10-CM | POA: Diagnosis present

## 2021-02-08 DIAGNOSIS — Z79899 Other long term (current) drug therapy: Secondary | ICD-10-CM | POA: Insufficient documentation

## 2021-02-08 DIAGNOSIS — Z95828 Presence of other vascular implants and grafts: Secondary | ICD-10-CM

## 2021-02-08 LAB — CBC WITH DIFFERENTIAL (CANCER CENTER ONLY)
Abs Immature Granulocytes: 0.05 10*3/uL (ref 0.00–0.07)
Basophils Absolute: 0.1 10*3/uL (ref 0.0–0.1)
Basophils Relative: 1 %
Eosinophils Absolute: 0 10*3/uL (ref 0.0–0.5)
Eosinophils Relative: 0 %
HCT: 31.5 % — ABNORMAL LOW (ref 36.0–46.0)
Hemoglobin: 10.2 g/dL — ABNORMAL LOW (ref 12.0–15.0)
Immature Granulocytes: 1 %
Lymphocytes Relative: 31 %
Lymphs Abs: 2.9 10*3/uL (ref 0.7–4.0)
MCH: 28.3 pg (ref 26.0–34.0)
MCHC: 32.4 g/dL (ref 30.0–36.0)
MCV: 87.5 fL (ref 80.0–100.0)
Monocytes Absolute: 1 10*3/uL (ref 0.1–1.0)
Monocytes Relative: 11 %
Neutro Abs: 5.3 10*3/uL (ref 1.7–7.7)
Neutrophils Relative %: 56 %
Platelet Count: 271 10*3/uL (ref 150–400)
RBC: 3.6 MIL/uL — ABNORMAL LOW (ref 3.87–5.11)
RDW: 16.2 % — ABNORMAL HIGH (ref 11.5–15.5)
WBC Count: 9.3 10*3/uL (ref 4.0–10.5)
nRBC: 0 % (ref 0.0–0.2)

## 2021-02-08 LAB — CMP (CANCER CENTER ONLY)
ALT: 20 U/L (ref 0–44)
AST: 15 U/L (ref 15–41)
Albumin: 3.8 g/dL (ref 3.5–5.0)
Alkaline Phosphatase: 96 U/L (ref 38–126)
Anion gap: 10 (ref 5–15)
BUN: 9 mg/dL (ref 8–23)
CO2: 26 mmol/L (ref 22–32)
Calcium: 9.7 mg/dL (ref 8.9–10.3)
Chloride: 107 mmol/L (ref 98–111)
Creatinine: 0.64 mg/dL (ref 0.44–1.00)
GFR, Estimated: >60 mL/min (ref >60)
Glucose, Bld: 100 mg/dL — ABNORMAL HIGH (ref 70–99)
Potassium: 3.4 mmol/L — ABNORMAL LOW (ref 3.5–5.1)
Sodium: 143 mmol/L (ref 135–145)
Total Bilirubin: 0.3 mg/dL (ref 0.3–1.2)
Total Protein: 6.8 g/dL (ref 6.5–8.1)

## 2021-02-08 MED ORDER — SODIUM CHLORIDE 0.9 % IV SOLN
75.0000 mg/m2 | Freq: Once | INTRAVENOUS | Status: AC
  Administered 2021-02-08: 140 mg via INTRAVENOUS
  Filled 2021-02-08: qty 14

## 2021-02-08 MED ORDER — ACETAMINOPHEN 325 MG PO TABS
650.0000 mg | ORAL_TABLET | Freq: Once | ORAL | Status: AC
  Administered 2021-02-08: 650 mg via ORAL
  Filled 2021-02-08: qty 2

## 2021-02-08 MED ORDER — SODIUM CHLORIDE 0.9 % IV SOLN
498.0000 mg | Freq: Once | INTRAVENOUS | Status: AC
  Administered 2021-02-08: 500 mg via INTRAVENOUS
  Filled 2021-02-08: qty 50

## 2021-02-08 MED ORDER — SODIUM CHLORIDE 0.9 % IV SOLN
Freq: Once | INTRAVENOUS | Status: AC
Start: 2021-02-08 — End: 2021-02-08

## 2021-02-08 MED ORDER — TRASTUZUMAB-DKST CHEMO 150 MG IV SOLR
6.0000 mg/kg | Freq: Once | INTRAVENOUS | Status: AC
  Administered 2021-02-08: 441 mg via INTRAVENOUS
  Filled 2021-02-08: qty 21

## 2021-02-08 MED ORDER — SODIUM CHLORIDE 0.9% FLUSH
10.0000 mL | Freq: Once | INTRAVENOUS | Status: AC
  Administered 2021-02-08: 10 mL

## 2021-02-08 MED ORDER — DIPHENHYDRAMINE HCL 25 MG PO CAPS
50.0000 mg | ORAL_CAPSULE | Freq: Once | ORAL | Status: AC
  Administered 2021-02-08: 50 mg via ORAL
  Filled 2021-02-08: qty 2

## 2021-02-08 MED ORDER — SODIUM CHLORIDE 0.9 % IV SOLN
10.0000 mg | Freq: Once | INTRAVENOUS | Status: AC
  Administered 2021-02-08: 10 mg via INTRAVENOUS
  Filled 2021-02-08: qty 10

## 2021-02-08 MED ORDER — SODIUM CHLORIDE 0.9 % IV SOLN
420.0000 mg | Freq: Once | INTRAVENOUS | Status: AC
  Administered 2021-02-08: 420 mg via INTRAVENOUS
  Filled 2021-02-08: qty 14

## 2021-02-08 MED ORDER — FOSAPREPITANT DIMEGLUMINE INJECTION 150 MG
150.0000 mg | Freq: Once | INTRAVENOUS | Status: AC
  Administered 2021-02-08: 150 mg via INTRAVENOUS
  Filled 2021-02-08: qty 150

## 2021-02-08 MED ORDER — PALONOSETRON HCL INJECTION 0.25 MG/5ML
0.2500 mg | Freq: Once | INTRAVENOUS | Status: AC
  Administered 2021-02-08: 0.25 mg via INTRAVENOUS
  Filled 2021-02-08: qty 5

## 2021-02-08 NOTE — Patient Instructions (Signed)
[facility] ONCOLOGY   Discharge Instructions: Thank you for choosing [facility] to provide your oncology and hematology care.   If you have a lab appointment with the [facility], please go directly to the [facility] and check in at the registration area.   Wear comfortable clothing and clothing appropriate for easy access to any Portacath or PICC line.   We strive to give you quality time with your provider. You may need to reschedule your appointment if you arrive late (15 or more minutes).  Arriving late affects you and other patients whose appointments are after yours.  Also, if you miss three or more appointments without notifying the office, you may be dismissed from the clinic at the provider's discretion.      For prescription refill requests, have your pharmacy contact our office and allow 72 hours for refills to be completed.    Today you received the following chemotherapy and/or immunotherapy agents: Trastuzumab (Herceptin), Pertuzumab (Perjeta), Docetaxel (Taxotere), and Carboplatin      To help prevent nausea and vomiting after your treatment, we encourage you to take your nausea medication as directed.  BELOW ARE SYMPTOMS THAT SHOULD BE REPORTED IMMEDIATELY: *FEVER GREATER THAN 100.4 F (38 C) OR HIGHER *CHILLS OR SWEATING *NAUSEA AND VOMITING THAT IS NOT CONTROLLED WITH YOUR NAUSEA MEDICATION *UNUSUAL SHORTNESS OF BREATH *UNUSUAL BRUISING OR BLEEDING *URINARY PROBLEMS (pain or burning when urinating, or frequent urination) *BOWEL PROBLEMS (unusual diarrhea, constipation, pain near the anus) TENDERNESS IN MOUTH AND THROAT WITH OR WITHOUT PRESENCE OF ULCERS (sore throat, sores in mouth, or a toothache) UNUSUAL RASH, SWELLING OR PAIN  UNUSUAL VAGINAL DISCHARGE OR ITCHING   Items with * indicate a potential emergency and should be followed up as soon as possible or go to the Emergency Department if any problems should occur.  Please  show the CHEMOTHERAPY ALERT CARD or IMMUNOTHERAPY ALERT CARD at check-in to the Emergency Department and triage nurse.  Should you have questions after your visit or need to cancel or reschedule your appointment, please contact [facility]  Dept: [phone]  and follow the prompts.  Office hours are 8:00 a.m. to 4:30 p.m. Monday - Friday. Please note that voicemails left after 4:00 p.m. may not be returned until the following business day.  We are closed weekends and major holidays. You have access to a nurse at all times for urgent questions. Please call the main number to the clinic Dept: [phone] and follow the prompts.   For any non-urgent questions, you may also contact your provider using MyChart. We now offer e-Visits for anyone 90 and older to request care online for non-urgent symptoms. For details visit [email or web address].   Also download the MyChart app! Go to the app store, search "MyChart", open the app, select [facility], and log in with your MyChart username and password.  Due to Covid, a mask is required upon entering the hospital/clinic. If you do not have a mask, one will be given to you upon arrival. For doctor visits, patients may have 1 support person aged 37 or older with them. For treatment visits, patients cannot have anyone with them due to current Covid guidelines and our immunocompromised population.

## 2021-02-08 NOTE — Assessment & Plan Note (Signed)
11/17/2020:Work-up performed at [facility]: Palpable right breast mass. Mammogram and ultrasound 11/10/2020: Spiculated 4.9 cm mass UOQ right breast with microcalcifications, biopsy revealed IDC ER/PR negative, HER2 positive 3+ by IHC, axillary lymph node positive (2 lymph nodes were detected)  Genetics were negative  Treatment Planbased on multidisciplinary tumor board: 1. Neoadjuvant chemotherapy with TCH Perjeta 6 cycles(started 12/29/2020)followed by Herceptin Perjeta maintenance versus Kadcyla maintenance (based on response to neoadjuvant chemo) for 1 year 2. mastectomy withtargeted node surgery (because of the additional positive biopsies) 3. Followed by adjuvant radiation therapy  Breast MRI 01/05/2021: Large area of non-mass enhancement 7 cm right breast UOQ.  Non-mass enhancement 2.5 cm central breast, third area of suspicious non-mass enhancement anterior UOQ 1.7 cm: MRI guided biopsy of non-mass enhancement in the central and anterolateral right breast 01/20/2021.  Intramammary lymph node Second Look ultrasound: Benign ------------------------------------------------------------------------------------------------------------------- Current Treatment:Cycle 3TCHP Chemo Toxicities: 1. Mild nausea on day 3 2. Mild intermittent diarrhea usually 1 loose stool per day responds to Imodium 3. Fatigue  She reports that her arthritis symptoms have completely disappeared.    MRI guided biopsy 01/28/2021: Right breast biopsy: IDC with DCIS, right breast biopsy outer anterior: DCIS  This could mean that she might need a mastectomy at the end of the neoadjuvant chemotherapy.  RTC in3weeksfor Cycle 3 

## 2021-02-10 ENCOUNTER — Inpatient Hospital Stay: Payer: Medicare Other

## 2021-02-10 ENCOUNTER — Other Ambulatory Visit: Payer: Self-pay

## 2021-02-10 VITALS — BP 130/60 | HR 71 | Temp 98.3°F | Resp 16

## 2021-02-10 DIAGNOSIS — C50411 Malignant neoplasm of upper-outer quadrant of right female breast: Secondary | ICD-10-CM

## 2021-02-10 DIAGNOSIS — Z171 Estrogen receptor negative status [ER-]: Secondary | ICD-10-CM

## 2021-02-10 DIAGNOSIS — Z5111 Encounter for antineoplastic chemotherapy: Secondary | ICD-10-CM | POA: Diagnosis not present

## 2021-02-10 MED ORDER — PEGFILGRASTIM-CBQV 6 MG/0.6ML ~~LOC~~ SOSY
6.0000 mg | PREFILLED_SYRINGE | Freq: Once | SUBCUTANEOUS | Status: AC
  Administered 2021-02-10: 6 mg via SUBCUTANEOUS
  Filled 2021-02-10: qty 0.6

## 2021-02-26 MED FILL — Dexamethasone Sodium Phosphate Inj 100 MG/10ML: INTRAMUSCULAR | Qty: 1 | Status: AC

## 2021-02-26 MED FILL — Fosaprepitant Dimeglumine For IV Infusion 150 MG (Base Eq): INTRAVENOUS | Qty: 5 | Status: AC

## 2021-02-27 NOTE — Progress Notes (Signed)
Patient Care Team: Pcp, No as PCP - General Mauro Kaufmann, RN as Oncology Nurse Navigator Rockwell Germany, RN as Oncology Nurse Navigator  DIAGNOSIS:    ICD-10-CM   1. Malignant neoplasm of upper-outer quadrant of right breast in female, estrogen receptor negative ([place])  C50.411 dexamethasone (DECADRON) 4 MG tablet   Z17.1       SUMMARY OF ONCOLOGIC HISTORY: Oncology History  Malignant neoplasm of upper-outer quadrant of right breast in female, estrogen receptor negative ([place])  11/17/2020 Initial Diagnosis   Work[facility]up performed at [facility]: Palpable right breast mass.  Mammogram and ultrasound 11/10/2020: Spiculated 4.9 cm mass UOQ right breast with microcalcifications, biopsy revealed IDC ER/PR negative, HER2 positive 3+ by IHC, axillary lymph node positive (2 lymph nodes were detected)   12/03/2020 PET scan   Right breast malignancy SUV 4.6, subcentimeter right level 1 and 2 axillary lymph nodes.  No distant metastatic disease.   12/21/2020 Cancer Staging   Staging form: Breast, AJCC 8th Edition - Clinical stage from 12/21/2020: Stage IIB (cT2, cN1, cM0, G3, ER-, PR-, HER2+) - Signed by Nicholas Lose, MD on 12/21/2020 Stage prefix: Initial diagnosis Histologic grading system: 3 grade system   12/29/2020 -  Chemotherapy    Patient is on Treatment Plan: BREAST  DOCETAXEL + CARBOPLATIN + TRASTUZUMAB + PERTUZUMAB  (TCHP) Q21D          CHIEF COMPLIANT: Cycle 4 TCH Perjeta  INTERVAL HISTORY: Marie Day is a 75 y.o. with above-mentioned history of invasive ductal carcinoma of the right breast, currently on chemotherapy with [place]. She presents to the clinic today for cycle 4.  Her major symptoms are related to fatigue, diarrhea, loss of taste, mild to moderate nausea  ALLERGIES:  is allergic to sulfa antibiotics.  MEDICATIONS:  Current Outpatient Medications  Medication Sig Dispense Refill   dexamethasone (DECADRON) 4 MG tablet Take 1 tablet (4 mg total) by mouth  daily. Take 1 tablet day before chemo and 1 tablet day after chemo with food 2 tablet 0   lidocaine-prilocaine (EMLA) cream Apply to affected area once 30 g 3   ondansetron (ZOFRAN) 8 MG tablet Take 1 tablet (8 mg total) by mouth 2 (two) times daily as needed (Nausea or vomiting). Start on the third day after chemotherapy. 30 tablet 1   prochlorperazine (COMPAZINE) 10 MG tablet TAKE 1 TABLET (10 MG TOTAL) BY MOUTH EVERY 6 (SIX) HOURS AS NEEDED (NAUSEA OR VOMITING). 30 tablet 1   No current facility-administered medications for this visit.    PHYSICAL EXAMINATION: ECOG PERFORMANCE STATUS: 1 - Symptomatic but completely ambulatory  Vitals:   03/01/21 1016  BP: 139/66  Pulse: 83  Resp: 18  Temp: 97.8 F (36.6 C)  SpO2: 100%   Filed Weights   03/01/21 1016  Weight: 152 lb 9.6 oz (69.2 kg)    LABORATORY DATA:  I have reviewed the data as listed CMP Latest Ref Rng & Units 02/08/2021 01/18/2021 01/05/2021  Glucose 70 - 99 mg/dL 100(H) 109(H) 128(H)  BUN 8 - 23 mg/dL $Remove'9 8 10  'jUMNatB$ Creatinine 0.44 - 1.00 mg/dL 0.64 0.69 1.07(H)  Sodium 135 - 145 mmol/L 143 143 135  Potassium 3.5 - 5.1 mmol/L 3.4(L) 3.3(L) 3.9  Chloride 98 - 111 mmol/L 107 106 99  CO2 22 - 32 mmol/L $RemoveB'26 25 24  'UTDBTBOz$ Calcium 8.9 - 10.3 mg/dL 9.7 9.7 9.5  Total Protein 6.5 - 8.1 g/dL 6.8 7.1 7.1  Total Bilirubin 0.3 - 1.2 mg/dL 0.3  0.2(L) 0.2(L)  Alkaline Phos 38 - 126 U/L 96 100 132(H)  AST 15 - 41 U/L $Remo'15 15 20  'sJITS$ ALT 0 - 44 U/L 20 23 37    Lab Results  Component Value Date   WBC 7.5 03/01/2021   HGB 10.2 (L) 03/01/2021   HCT 31.3 (L) 03/01/2021   MCV 88.4 03/01/2021   PLT 245 03/01/2021   NEUTROABS 4.1 03/01/2021    ASSESSMENT & PLAN:  Malignant neoplasm of upper-outer quadrant of right breast in female, estrogen receptor negative ([place]) 11/17/2020:Work-up performed at [facility]: Palpable right breast mass.  Mammogram and ultrasound 11/10/2020: Spiculated 4.9 cm mass UOQ right breast with microcalcifications, biopsy  revealed IDC ER/PR negative, HER2 positive 3+ by IHC, axillary lymph node positive (2 lymph nodes were detected)   Genetics were negative   Treatment Plan based on multidisciplinary tumor board: 1. Neoadjuvant chemotherapy with TCH Perjeta 6 cycles (started 12/29/2020) followed by Herceptin Perjeta maintenance versus Kadcyla maintenance (based on response to neoadjuvant chemo) for 1 year 2. mastectomy with targeted node surgery (because of the additional positive biopsies) 3. Followed by adjuvant radiation therapy   Breast MRI 01/05/2021: Large area of non-mass enhancement 7 cm right breast UOQ.  Non-mass enhancement 2.5 cm central breast, third area of suspicious non-mass enhancement anterior UOQ 1.7 cm: MRI guided biopsy of non-mass enhancement in the central and anterolateral right breast 01/20/2021.  Intramammary lymph node Second Look ultrasound: Benign ------------------------------------------------------------------------------------------------------------------- Current Treatment: Cycle 4 TCHP Chemo Toxicities: Mild nausea: Prilosec did not help her.  She is back to taking her nausea medications. Mild intermittent diarrhea usually 1 loose stool per day responds to Imodium Fatigue Neuropathy: Monitoring closely.  It is no different than before.  She is going to use ice today. 5.  Chemo induced anemia: Hemoglobin is 10.2: Stable  Arthritis symptoms have disappeared with chemotherapy MRI guided biopsy 01/28/2021: Right breast biopsy: IDC with DCIS, right breast biopsy outer anterior: DCIS  This could mean that she might need a mastectomy at the end of the neoadjuvant chemotherapy.   RTC in 3 weeks for Cycle 5    No orders of the defined types were placed in this encounter.  The patient has a good understanding of the overall plan. she agrees with it. she will call with any problems that may develop before the next visit here.  Total time spent: 30 mins including face to face time and  time spent for planning, charting and coordination of care  Rulon Eisenmenger, MD, MPH 03/01/2021  I, Thana Ates, am acting as scribe for Dr. Nicholas Lose.  I have reviewed the above documentation for accuracy and completeness, and I agree with the above.

## 2021-03-01 ENCOUNTER — Encounter: Payer: Self-pay | Admitting: *Deleted

## 2021-03-01 ENCOUNTER — Inpatient Hospital Stay: Payer: Medicare Other | Attending: Hematology and Oncology

## 2021-03-01 ENCOUNTER — Inpatient Hospital Stay (HOSPITAL_BASED_OUTPATIENT_CLINIC_OR_DEPARTMENT_OTHER): Payer: Medicare Other | Admitting: Hematology and Oncology

## 2021-03-01 ENCOUNTER — Other Ambulatory Visit: Payer: Self-pay

## 2021-03-01 ENCOUNTER — Inpatient Hospital Stay: Payer: Medicare Other

## 2021-03-01 DIAGNOSIS — Z5111 Encounter for antineoplastic chemotherapy: Secondary | ICD-10-CM | POA: Insufficient documentation

## 2021-03-01 DIAGNOSIS — Z79899 Other long term (current) drug therapy: Secondary | ICD-10-CM | POA: Diagnosis not present

## 2021-03-01 DIAGNOSIS — Z95828 Presence of other vascular implants and grafts: Secondary | ICD-10-CM

## 2021-03-01 DIAGNOSIS — R5383 Other fatigue: Secondary | ICD-10-CM | POA: Insufficient documentation

## 2021-03-01 DIAGNOSIS — C50411 Malignant neoplasm of upper-outer quadrant of right female breast: Secondary | ICD-10-CM | POA: Insufficient documentation

## 2021-03-01 DIAGNOSIS — D6481 Anemia due to antineoplastic chemotherapy: Secondary | ICD-10-CM | POA: Insufficient documentation

## 2021-03-01 DIAGNOSIS — Z171 Estrogen receptor negative status [ER-]: Secondary | ICD-10-CM

## 2021-03-01 DIAGNOSIS — R112 Nausea with vomiting, unspecified: Secondary | ICD-10-CM | POA: Insufficient documentation

## 2021-03-01 DIAGNOSIS — Z23 Encounter for immunization: Secondary | ICD-10-CM | POA: Insufficient documentation

## 2021-03-01 DIAGNOSIS — G620 Drug-induced polyneuropathy: Secondary | ICD-10-CM | POA: Diagnosis not present

## 2021-03-01 DIAGNOSIS — T451X5A Adverse effect of antineoplastic and immunosuppressive drugs, initial encounter: Secondary | ICD-10-CM | POA: Insufficient documentation

## 2021-03-01 LAB — CBC WITH DIFFERENTIAL (CANCER CENTER ONLY)
Abs Immature Granulocytes: 0.03 10*3/uL (ref 0.00–0.07)
Basophils Absolute: 0.1 10*3/uL (ref 0.0–0.1)
Basophils Relative: 1 %
Eosinophils Absolute: 0 10*3/uL (ref 0.0–0.5)
Eosinophils Relative: 0 %
HCT: 31.3 % — ABNORMAL LOW (ref 36.0–46.0)
Hemoglobin: 10.2 g/dL — ABNORMAL LOW (ref 12.0–15.0)
Immature Granulocytes: 0 %
Lymphocytes Relative: 33 %
Lymphs Abs: 2.5 10*3/uL (ref 0.7–4.0)
MCH: 28.8 pg (ref 26.0–34.0)
MCHC: 32.6 g/dL (ref 30.0–36.0)
MCV: 88.4 fL (ref 80.0–100.0)
Monocytes Absolute: 0.9 10*3/uL (ref 0.1–1.0)
Monocytes Relative: 12 %
Neutro Abs: 4.1 10*3/uL (ref 1.7–7.7)
Neutrophils Relative %: 54 %
Platelet Count: 245 10*3/uL (ref 150–400)
RBC: 3.54 MIL/uL — ABNORMAL LOW (ref 3.87–5.11)
RDW: 17.8 % — ABNORMAL HIGH (ref 11.5–15.5)
WBC Count: 7.5 10*3/uL (ref 4.0–10.5)
nRBC: 0 % (ref 0.0–0.2)

## 2021-03-01 LAB — CMP (CANCER CENTER ONLY)
ALT: 26 U/L (ref 0–44)
AST: 21 U/L (ref 15–41)
Albumin: 3.9 g/dL (ref 3.5–5.0)
Alkaline Phosphatase: 99 U/L (ref 38–126)
Anion gap: 11 (ref 5–15)
BUN: 6 mg/dL — ABNORMAL LOW (ref 8–23)
CO2: 26 mmol/L (ref 22–32)
Calcium: 9.8 mg/dL (ref 8.9–10.3)
Chloride: 106 mmol/L (ref 98–111)
Creatinine: 0.7 mg/dL (ref 0.44–1.00)
GFR, Estimated: >60 mL/min (ref >60)
Glucose, Bld: 114 mg/dL — ABNORMAL HIGH (ref 70–99)
Potassium: 3.2 mmol/L — ABNORMAL LOW (ref 3.5–5.1)
Sodium: 143 mmol/L (ref 135–145)
Total Bilirubin: 0.3 mg/dL (ref 0.3–1.2)
Total Protein: 6.7 g/dL (ref 6.5–8.1)

## 2021-03-01 MED ORDER — SODIUM CHLORIDE 0.9 % IV SOLN
498.0000 mg | Freq: Once | INTRAVENOUS | Status: AC
  Administered 2021-03-01: 500 mg via INTRAVENOUS
  Filled 2021-03-01: qty 50

## 2021-03-01 MED ORDER — SODIUM CHLORIDE 0.9 % IV SOLN
150.0000 mg | Freq: Once | INTRAVENOUS | Status: AC
  Administered 2021-03-01: 150 mg via INTRAVENOUS
  Filled 2021-03-01: qty 150

## 2021-03-01 MED ORDER — PALONOSETRON HCL INJECTION 0.25 MG/5ML
0.2500 mg | Freq: Once | INTRAVENOUS | Status: AC
  Administered 2021-03-01: 0.25 mg via INTRAVENOUS
  Filled 2021-03-01: qty 5

## 2021-03-01 MED ORDER — SODIUM CHLORIDE 0.9 % IV SOLN
75.0000 mg/m2 | Freq: Once | INTRAVENOUS | Status: AC
  Administered 2021-03-01: 140 mg via INTRAVENOUS
  Filled 2021-03-01: qty 14

## 2021-03-01 MED ORDER — SODIUM CHLORIDE 0.9% FLUSH
10.0000 mL | INTRAVENOUS | Status: DC | PRN
  Administered 2021-03-01: 10 mL

## 2021-03-01 MED ORDER — SODIUM CHLORIDE 0.9 % IV SOLN
10.0000 mg | Freq: Once | INTRAVENOUS | Status: AC
  Administered 2021-03-01: 10 mg via INTRAVENOUS
  Filled 2021-03-01: qty 10

## 2021-03-01 MED ORDER — SODIUM CHLORIDE 0.9% FLUSH: 10.0000 mL | Freq: Once | INTRAVENOUS | Status: DC

## 2021-03-01 MED ORDER — SODIUM CHLORIDE 0.9 % IV SOLN
420.0000 mg | Freq: Once | INTRAVENOUS | Status: AC
  Administered 2021-03-01: 420 mg via INTRAVENOUS
  Filled 2021-03-01: qty 14

## 2021-03-01 MED ORDER — DEXAMETHASONE 4 MG PO TABS: 4.0000 mg | ORAL_TABLET | Freq: Every day | ORAL | 0 refills | Status: DC

## 2021-03-01 MED ORDER — SODIUM CHLORIDE 0.9 % IV SOLN: Freq: Once | INTRAVENOUS | Status: AC

## 2021-03-01 MED ORDER — TRASTUZUMAB-DKST CHEMO 150 MG IV SOLR
6.0000 mg/kg | Freq: Once | INTRAVENOUS | Status: AC
  Administered 2021-03-01: 441 mg via INTRAVENOUS
  Filled 2021-03-01: qty 21

## 2021-03-01 MED ORDER — HEPARIN SOD (PORK) LOCK FLUSH 100 UNIT/ML IV SOLN
500.0000 [IU] | Freq: Once | INTRAVENOUS | Status: AC | PRN
  Administered 2021-03-01: 500 [IU]

## 2021-03-01 MED ORDER — DIPHENHYDRAMINE HCL 25 MG PO CAPS
50.0000 mg | ORAL_CAPSULE | Freq: Once | ORAL | Status: AC
  Administered 2021-03-01: 50 mg via ORAL
  Filled 2021-03-01: qty 2

## 2021-03-01 MED ORDER — ACETAMINOPHEN 325 MG PO TABS
650.0000 mg | ORAL_TABLET | Freq: Once | ORAL | Status: AC
  Administered 2021-03-01: 650 mg via ORAL
  Filled 2021-03-01: qty 2

## 2021-03-01 MED ORDER — INFLUENZA VAC A&B SA ADJ QUAD 0.5 ML IM PRSY
0.5000 mL | PREFILLED_SYRINGE | Freq: Once | INTRAMUSCULAR | Status: AC
  Administered 2021-03-01: 0.5 mL via INTRAMUSCULAR
  Filled 2021-03-01: qty 0.5

## 2021-03-01 NOTE — Patient Instructions (Signed)
[facility] ONCOLOGY   Discharge Instructions: Thank you for choosing [facility] to provide your oncology and hematology care.   If you have a lab appointment with the [facility], please go directly to the [facility] and check in at the registration area.   Wear comfortable clothing and clothing appropriate for easy access to any Portacath or PICC line.   We strive to give you quality time with your provider. You may need to reschedule your appointment if you arrive late (15 or more minutes).  Arriving late affects you and other patients whose appointments are after yours.  Also, if you miss three or more appointments without notifying the office, you may be dismissed from the clinic at the provider's discretion.      For prescription refill requests, have your pharmacy contact our office and allow 72 hours for refills to be completed.    Today you received the following chemotherapy and/or immunotherapy agents: Trastuzumab (Herceptin), Pertuzumab (Perjeta), Docetaxel (Taxotere), and Carboplatin      To help prevent nausea and vomiting after your treatment, we encourage you to take your nausea medication as directed.  BELOW ARE SYMPTOMS THAT SHOULD BE REPORTED IMMEDIATELY: *FEVER GREATER THAN 100.4 F (38 C) OR HIGHER *CHILLS OR SWEATING *NAUSEA AND VOMITING THAT IS NOT CONTROLLED WITH YOUR NAUSEA MEDICATION *UNUSUAL SHORTNESS OF BREATH *UNUSUAL BRUISING OR BLEEDING *URINARY PROBLEMS (pain or burning when urinating, or frequent urination) *BOWEL PROBLEMS (unusual diarrhea, constipation, pain near the anus) TENDERNESS IN MOUTH AND THROAT WITH OR WITHOUT PRESENCE OF ULCERS (sore throat, sores in mouth, or a toothache) UNUSUAL RASH, SWELLING OR PAIN  UNUSUAL VAGINAL DISCHARGE OR ITCHING   Items with * indicate a potential emergency and should be followed up as soon as possible or go to the Emergency Department if any problems should occur.  Please  show the CHEMOTHERAPY ALERT CARD or IMMUNOTHERAPY ALERT CARD at check-in to the Emergency Department and triage nurse.  Should you have questions after your visit or need to cancel or reschedule your appointment, please contact [facility]  Dept: [phone]  and follow the prompts.  Office hours are 8:00 a.m. to 4:30 p.m. Monday - Friday. Please note that voicemails left after 4:00 p.m. may not be returned until the following business day.  We are closed weekends and major holidays. You have access to a nurse at all times for urgent questions. Please call the main number to the clinic Dept: [phone] and follow the prompts.   For any non-urgent questions, you may also contact your provider using MyChart. We now offer e-Visits for anyone 90 and older to request care online for non-urgent symptoms. For details visit [email or web address].   Also download the MyChart app! Go to the app store, search "MyChart", open the app, select [facility], and log in with your MyChart username and password.  Due to Covid, a mask is required upon entering the hospital/clinic. If you do not have a mask, one will be given to you upon arrival. For doctor visits, patients may have 1 support person aged 37 or older with them. For treatment visits, patients cannot have anyone with them due to current Covid guidelines and our immunocompromised population.

## 2021-03-01 NOTE — Assessment & Plan Note (Signed)
11/17/2020:Work-up performed at [facility]: Palpable right breast mass. Mammogram and ultrasound 11/10/2020: Spiculated 4.9 cm mass UOQ right breast with microcalcifications, biopsy revealed IDC ER/PR negative, HER2 positive 3+ by IHC, axillary lymph node positive (2 lymph nodes were detected)  Genetics were negative  Treatment Planbased on multidisciplinary tumor board: 1. Neoadjuvant chemotherapy with [place] Perjeta 6 cycles(started 12/29/2020)followed by Herceptin Perjeta maintenance versus Kadcyla maintenance (based on response to neoadjuvant chemo) for 1 year 2. mastectomy withtargeted node surgery (because of the additional positive biopsies) 3. Followed by adjuvant radiation therapy  Breast MRI 01/05/2021: Large area of non-mass enhancement 7 cm right breast UOQ. Non-mass enhancement 2.5 cm central breast, third area of suspicious non-mass enhancement anterior UOQ 1.7 cm: MRI guided biopsy of non-mass enhancement in the central and anterolateral right breast 01/20/2021. Intramammary lymph node Second Look ultrasound: Benign ------------------------------------------------------------------------------------------------------------------- Current Treatment:Cycle4TCHP Chemo Toxicities: 1. Mild nausea: Could be related to gastritis.  I instructed her to take over-the-counter Prilosec. 2. Mild intermittent diarrhea usually 1 loose stool per day responds to Imodium 3. Fatigue 4. Neuropathy: Monitoring closely.  It is no different than before.  She is going to use ice today.  She reports that her arthritis symptoms have completely disappeared for a couple of weeks after chemo and then they come back the week before the new treatment. MRI guided biopsy 01/28/2021: Right breast biopsy: IDC with DCIS, right breast biopsy outer anterior: DCIS  This could mean that she might need a mastectomy at the end of the neoadjuvant chemotherapy.  RTC in3weeksfor Cycle4

## 2021-03-03 ENCOUNTER — Other Ambulatory Visit: Payer: Self-pay

## 2021-03-03 ENCOUNTER — Inpatient Hospital Stay: Payer: Medicare Other

## 2021-03-03 VITALS — BP 119/65 | HR 73 | Temp 98.7°F | Resp 16

## 2021-03-03 DIAGNOSIS — Z171 Estrogen receptor negative status [ER-]: Secondary | ICD-10-CM

## 2021-03-03 DIAGNOSIS — Z5111 Encounter for antineoplastic chemotherapy: Secondary | ICD-10-CM | POA: Diagnosis not present

## 2021-03-03 MED ORDER — PEGFILGRASTIM-CBQV 6 MG/0.6ML ~~LOC~~ SOSY
6.0000 mg | PREFILLED_SYRINGE | Freq: Once | SUBCUTANEOUS | Status: AC
  Administered 2021-03-03: 6 mg via SUBCUTANEOUS
  Filled 2021-03-03: qty 0.6

## 2021-03-08 ENCOUNTER — Encounter: Payer: Self-pay | Admitting: *Deleted

## 2021-03-12 ENCOUNTER — Encounter: Payer: Self-pay | Admitting: Hematology and Oncology

## 2021-03-19 MED FILL — Dexamethasone Sodium Phosphate Inj 100 MG/10ML: INTRAMUSCULAR | Qty: 1 | Status: AC

## 2021-03-19 MED FILL — Fosaprepitant Dimeglumine For IV Infusion 150 MG (Base Eq): INTRAVENOUS | Qty: 5 | Status: AC

## 2021-03-22 ENCOUNTER — Inpatient Hospital Stay (HOSPITAL_BASED_OUTPATIENT_CLINIC_OR_DEPARTMENT_OTHER): Payer: Medicare Other | Admitting: Oncology

## 2021-03-22 ENCOUNTER — Inpatient Hospital Stay: Payer: Medicare Other

## 2021-03-22 ENCOUNTER — Other Ambulatory Visit: Payer: Self-pay

## 2021-03-22 ENCOUNTER — Encounter: Payer: Self-pay | Admitting: Hematology and Oncology

## 2021-03-22 VITALS — BP 133/56 | HR 89 | Temp 97.8°F | Resp 18 | Ht 65.5 in | Wt 151.3 lb

## 2021-03-22 VITALS — BP 101/52 | HR 64 | Temp 98.0°F | Resp 16

## 2021-03-22 DIAGNOSIS — Z95828 Presence of other vascular implants and grafts: Secondary | ICD-10-CM

## 2021-03-22 DIAGNOSIS — C50411 Malignant neoplasm of upper-outer quadrant of right female breast: Secondary | ICD-10-CM

## 2021-03-22 DIAGNOSIS — Z171 Estrogen receptor negative status [ER-]: Secondary | ICD-10-CM

## 2021-03-22 DIAGNOSIS — Z5111 Encounter for antineoplastic chemotherapy: Secondary | ICD-10-CM | POA: Diagnosis not present

## 2021-03-22 LAB — CBC WITH DIFFERENTIAL (CANCER CENTER ONLY)
Abs Immature Granulocytes: 0.06 10*3/uL (ref 0.00–0.07)
Basophils Absolute: 0.1 10*3/uL (ref 0.0–0.1)
Basophils Relative: 1 %
Eosinophils Absolute: 0 10*3/uL (ref 0.0–0.5)
Eosinophils Relative: 0 %
HCT: 30.1 % — ABNORMAL LOW (ref 36.0–46.0)
Hemoglobin: 9.6 g/dL — ABNORMAL LOW (ref 12.0–15.0)
Immature Granulocytes: 1 %
Lymphocytes Relative: 37 %
Lymphs Abs: 2.9 10*3/uL (ref 0.7–4.0)
MCH: 29.2 pg (ref 26.0–34.0)
MCHC: 31.9 g/dL (ref 30.0–36.0)
MCV: 91.5 fL (ref 80.0–100.0)
Monocytes Absolute: 0.8 10*3/uL (ref 0.1–1.0)
Monocytes Relative: 10 %
Neutro Abs: 4 10*3/uL (ref 1.7–7.7)
Neutrophils Relative %: 51 %
Platelet Count: 254 10*3/uL (ref 150–400)
RBC: 3.29 MIL/uL — ABNORMAL LOW (ref 3.87–5.11)
RDW: 18.4 % — ABNORMAL HIGH (ref 11.5–15.5)
WBC Count: 7.8 10*3/uL (ref 4.0–10.5)
nRBC: 0 % (ref 0.0–0.2)

## 2021-03-22 LAB — CMP (CANCER CENTER ONLY)
ALT: 20 U/L (ref 0–44)
AST: 17 U/L (ref 15–41)
Albumin: 3.7 g/dL (ref 3.5–5.0)
Alkaline Phosphatase: 90 U/L (ref 38–126)
Anion gap: 10 (ref 5–15)
BUN: 7 mg/dL — ABNORMAL LOW (ref 8–23)
CO2: 25 mmol/L (ref 22–32)
Calcium: 9.4 mg/dL (ref 8.9–10.3)
Chloride: 107 mmol/L (ref 98–111)
Creatinine: 0.67 mg/dL (ref 0.44–1.00)
GFR, Estimated: >60 mL/min (ref >60)
Glucose, Bld: 110 mg/dL — ABNORMAL HIGH (ref 70–99)
Potassium: 3.4 mmol/L — ABNORMAL LOW (ref 3.5–5.1)
Sodium: 142 mmol/L (ref 135–145)
Total Bilirubin: 0.4 mg/dL (ref 0.3–1.2)
Total Protein: 6.4 g/dL — ABNORMAL LOW (ref 6.5–8.1)

## 2021-03-22 MED ORDER — SODIUM CHLORIDE 0.9 % IV SOLN
75.0000 mg/m2 | Freq: Once | INTRAVENOUS | Status: AC
  Administered 2021-03-22: 140 mg via INTRAVENOUS
  Filled 2021-03-22: qty 14

## 2021-03-22 MED ORDER — HEPARIN SOD (PORK) LOCK FLUSH 100 UNIT/ML IV SOLN
500.0000 [IU] | Freq: Once | INTRAVENOUS | Status: AC | PRN
  Administered 2021-03-22: 500 [IU]

## 2021-03-22 MED ORDER — DIPHENHYDRAMINE HCL 25 MG PO CAPS
50.0000 mg | ORAL_CAPSULE | Freq: Once | ORAL | Status: AC
  Administered 2021-03-22: 50 mg via ORAL
  Filled 2021-03-22: qty 2

## 2021-03-22 MED ORDER — SODIUM CHLORIDE 0.9 % IV SOLN
150.0000 mg | Freq: Once | INTRAVENOUS | Status: AC
  Administered 2021-03-22: 150 mg via INTRAVENOUS
  Filled 2021-03-22: qty 150

## 2021-03-22 MED ORDER — ACETAMINOPHEN 325 MG PO TABS
650.0000 mg | ORAL_TABLET | Freq: Once | ORAL | Status: AC
  Administered 2021-03-22: 650 mg via ORAL
  Filled 2021-03-22: qty 2

## 2021-03-22 MED ORDER — SODIUM CHLORIDE 0.9 % IV SOLN: Freq: Once | INTRAVENOUS | Status: AC

## 2021-03-22 MED ORDER — TRASTUZUMAB-DKST CHEMO 150 MG IV SOLR
6.0000 mg/kg | Freq: Once | INTRAVENOUS | Status: AC
  Administered 2021-03-22: 441 mg via INTRAVENOUS
  Filled 2021-03-22: qty 21

## 2021-03-22 MED ORDER — SODIUM CHLORIDE 0.9% FLUSH
10.0000 mL | Freq: Once | INTRAVENOUS | Status: AC
  Administered 2021-03-22: 10 mL

## 2021-03-22 MED ORDER — SODIUM CHLORIDE 0.9 % IV SOLN
10.0000 mg | Freq: Once | INTRAVENOUS | Status: AC
  Administered 2021-03-22: 10 mg via INTRAVENOUS
  Filled 2021-03-22: qty 10

## 2021-03-22 MED ORDER — CARBOPLATIN CHEMO INJECTION 600 MG/60ML
498.0000 mg | Freq: Once | INTRAVENOUS | Status: AC
  Administered 2021-03-22: 500 mg via INTRAVENOUS
  Filled 2021-03-22: qty 50

## 2021-03-22 MED ORDER — SODIUM CHLORIDE 0.9 % IV SOLN
420.0000 mg | Freq: Once | INTRAVENOUS | Status: AC
  Administered 2021-03-22: 420 mg via INTRAVENOUS
  Filled 2021-03-22: qty 14

## 2021-03-22 MED ORDER — PALONOSETRON HCL INJECTION 0.25 MG/5ML
0.2500 mg | Freq: Once | INTRAVENOUS | Status: AC
  Administered 2021-03-22: 0.25 mg via INTRAVENOUS
  Filled 2021-03-22: qty 5

## 2021-03-22 MED ORDER — SODIUM CHLORIDE 0.9% FLUSH
10.0000 mL | INTRAVENOUS | Status: DC | PRN
  Administered 2021-03-22: 10 mL

## 2021-03-22 NOTE — Patient Instructions (Signed)
[facility]  Discharge Instructions: Thank you for choosing [facility] to provide your oncology and hematology care.   If you have a lab appointment with the [facility], please go directly to the [facility] and check in at the registration area.   Wear comfortable clothing and clothing appropriate for easy access to any Portacath or PICC line.   We strive to give you quality time with your provider. You may need to reschedule your appointment if you arrive late (15 or more minutes).  Arriving late affects you and other patients whose appointments are after yours.  Also, if you miss three or more appointments without notifying the office, you may be dismissed from the clinic at the provider's discretion.      For prescription refill requests, have your pharmacy contact our office and allow 72 hours for refills to be completed.    Today you received the following chemotherapy and/or immunotherapy agents: Trastuzumab (Ogivri), and Pertuzumab (Perjeta),  Docetaxel (Taxotere), and Carboplatin.   To help prevent nausea and vomiting after your treatment, we encourage you to take your nausea medication as directed.  BELOW ARE SYMPTOMS THAT SHOULD BE REPORTED IMMEDIATELY: *FEVER GREATER THAN 100.4 F (38 C) OR HIGHER *CHILLS OR SWEATING *NAUSEA AND VOMITING THAT IS NOT CONTROLLED WITH YOUR NAUSEA MEDICATION *UNUSUAL SHORTNESS OF BREATH *UNUSUAL BRUISING OR BLEEDING *URINARY PROBLEMS (pain or burning when urinating, or frequent urination) *BOWEL PROBLEMS (unusual diarrhea, constipation, pain near the anus) TENDERNESS IN MOUTH AND THROAT WITH OR WITHOUT PRESENCE OF ULCERS (sore throat, sores in mouth, or a toothache) UNUSUAL RASH, SWELLING OR PAIN  UNUSUAL VAGINAL DISCHARGE OR ITCHING   Items with * indicate a potential emergency and should be followed up as soon as possible or go to the Emergency Department if any problems should occur.  Please  show the CHEMOTHERAPY ALERT CARD or IMMUNOTHERAPY ALERT CARD at check-in to the Emergency Department and triage nurse.  Should you have questions after your visit or need to cancel or reschedule your appointment, please contact [facility]  Dept: [phone]  and follow the prompts.  Office hours are 8:00 a.m. to 4:30 p.m. Monday - Friday. Please note that voicemails left after 4:00 p.m. may not be returned until the following business day.  We are closed weekends and major holidays. You have access to a nurse at all times for urgent questions. Please call the main number to the clinic Dept: [phone] and follow the prompts.   For any non-urgent questions, you may also contact your provider using MyChart. We now offer e-Visits for anyone 52 and older to request care online for non-urgent symptoms. For details visit [email or web address].   Also download the MyChart app! Go to the app store, search "MyChart", open the app, select [facility], and log in with your MyChart username and password.  Due to Covid, a mask is required upon entering the hospital/clinic. If you do not have a mask, one will be given to you upon arrival. For doctor visits, patients may have 1 support person aged 80 or older with them. For treatment visits, patients cannot have anyone with them due to current Covid guidelines and our immunocompromised population.

## 2021-03-22 NOTE — Progress Notes (Signed)
Patient Care Team: Pcp, No as PCP - General Mauro Kaufmann, RN as Oncology Nurse Navigator Rockwell Germany, RN as Oncology Nurse Navigator  DIAGNOSIS:    ICD-10-CM   1. Malignant neoplasm of upper-outer quadrant of right breast in female, estrogen receptor negative ([place])  C50.411    Z17.1       SUMMARY OF ONCOLOGIC HISTORY: Oncology History  Malignant neoplasm of upper-outer quadrant of right breast in female, estrogen receptor negative ([place])  11/17/2020 Initial Diagnosis   Work-up performed at [facility]: Palpable right breast mass.  Mammogram and ultrasound 11/10/2020: Spiculated 4.9 cm mass UOQ right breast with microcalcifications, biopsy revealed IDC ER/PR negative, HER2 positive 3+ by IHC, axillary lymph node positive (2 lymph nodes were detected)   12/03/2020 PET scan   Right breast malignancy SUV 4.6, subcentimeter right level 1 and 2 axillary lymph nodes.  No distant metastatic disease.   12/21/2020 Cancer Staging   Staging form: Breast, AJCC 8th Edition - Clinical stage from 12/21/2020: Stage IIB (cT2, cN1, cM0, G3, ER-, PR-, HER2+) - Signed by Nicholas Lose, MD on 12/21/2020 Stage prefix: Initial diagnosis Histologic grading system: 3 grade system    12/29/2020 -  Chemotherapy   Patient is on Treatment Plan : BREAST  Docetaxel + Carboplatin + Trastuzumab + Pertuzumab  (TCHP) q21d        CHIEF COMPLIANT: Cycle 5 TCH Perjeta  INTERVAL HISTORY: Marie Day is a 75 y.o. with above-mentioned history of invasive ductal carcinoma of the right breast, currently on chemotherapy with [place]. She presents to the clinic today for cycle 5.   ROS: She tells me she has lost about 30 pounds since starting treatment.  This is partly due to to loss of appetite and taste alteration, to a lesser extent with nausea.  As she does take dexamethasone as prescribed day 0 and days 2 and 3 but she decided not to try Compazine or Zofran after cycle 4 and found that they really did not  help all that much.  She had some nausea but no vomiting just as before.  She did not like the way those medicines made her feel.  She does have neuropathy symptoms involving both the fingers and toes periods she feels like the front of her feet are swollen but when she checks they really are not swollen.  She is not dropping things.  She tells me this was present at the last visit and that it has not become any worse.  Aside from that a detailed review of systems today was stable.  ALLERGIES:  is allergic to sulfa antibiotics.  MEDICATIONS:  Current Outpatient Medications  Medication Sig Dispense Refill   dexamethasone (DECADRON) 4 MG tablet Take 1 tablet (4 mg total) by mouth daily. Take 1 tablet day before chemo and 1 tablet day after chemo with food 2 tablet 0   lidocaine-prilocaine (EMLA) cream Apply to affected area once 30 g 3   ondansetron (ZOFRAN) 8 MG tablet Take 1 tablet (8 mg total) by mouth 2 (two) times daily as needed (Nausea or vomiting). Start on the third day after chemotherapy. 30 tablet 1   prochlorperazine (COMPAZINE) 10 MG tablet TAKE 1 TABLET (10 MG TOTAL) BY MOUTH EVERY 6 (SIX) HOURS AS NEEDED (NAUSEA OR VOMITING). 30 tablet 1   No current facility-administered medications for this visit.   Facility-Administered Medications Ordered in Other Visits  Medication Dose Route Frequency Provider Last Rate Last Admin   sodium chloride flush (  NS) 0.9 % injection 10 mL  10 mL Intracatheter PRN Nicholas Lose, MD   10 mL at 03/22/21 1545    PHYSICAL EXAMINATION: ECOG PERFORMANCE STATUS: 1 - Symptomatic but completely ambulatory  Vitals:   03/22/21 1005  BP: (!) 133/56  Pulse: 89  Resp: 18  Temp: 97.8 F (36.6 C)  SpO2: 99%   Filed Weights   03/22/21 1005  Weight: 151 lb 4.8 oz (68.6 kg)   Sclerae unicteric, EOMs intact Wearing a mask No cervical or supraclavicular adenopathy Lungs no rales or rhonchi Heart regular rate and rhythm Abd soft, nontender, positive  bowel sounds MSK no focal spinal tenderness, no upper extremity lymphedema Neuro: nonfocal, well oriented, positive affect Breasts: Deferred  LABORATORY DATA:  I have reviewed the data as listed CMP Latest Ref Rng & Units 03/22/2021 03/01/2021 02/08/2021  Glucose 70 - 99 mg/dL 110(H) 114(H) 100(H)  BUN 8 - 23 mg/dL 7(L) 6(L) 9  Creatinine 0.44 - 1.00 mg/dL 0.67 0.70 0.64  Sodium 135 - 145 mmol/L 142 143 143  Potassium 3.5 - 5.1 mmol/L 3.4(L) 3.2(L) 3.4(L)  Chloride 98 - 111 mmol/L 107 106 107  CO2 22 - 32 mmol/L _0 Calcium 8.9 - 10.3 mg/dL 9.4 9.8 9.7  Total Protein 6.5 - 8.1 g/dL 6.4(L) 6.7 6.8  Total Bilirubin 0.3 - 1.2 mg/dL 0.4 0.3 0.3  Alkaline Phos 38 - 126 U/L 90 99 96  AST 15 - 41 U/L _1 ALT 0 - 44 U/L _2 Lab Results  Component Value Date   WBC 7.8 03/22/2021   HGB 9.6 (L) 03/22/2021   HCT 30.1 (L) 03/22/2021   MCV 91.5 03/22/2021   PLT 254 03/22/2021   NEUTROABS 4.0 03/22/2021    ASSESSMENT & PLAN:  Malignant neoplasm of upper-outer quadrant of right breast in female, estrogen receptor negative ([place]) 11/17/2020:Work[facility]up performed at [facility]: Palpable right breast mass.  Mammogram and ultrasound 11/10/2020: Spiculated 4.9 cm mass UOQ right breast with microcalcifications, biopsy revealed IDC ER/PR negative, HER2 positive 3+ by IHC, axillary lymph node positive (2 lymph nodes were detected)   Genetics were negative   Treatment Plan based on multidisciplinary tumor board: 1. Neoadjuvant chemotherapy with TCH Perjeta 6 cycles (started 12/29/2020) followed by Herceptin Perjeta maintenance versus Kadcyla maintenance (based on response to neoadjuvant chemo) for 1 year 2. mastectomy with targeted node surgery (because of the additional positive biopsies) 3. Followed by adjuvant radiation therapy   Breast MRI 01/05/2021: Large area of non-mass enhancement 7 cm right breast UOQ.  Non-mass enhancement 2.5 cm central breast, third area of suspicious  non-mass enhancement anterior UOQ 1.7 cm: MRI guided biopsy of non-mass enhancement in the central and anterolateral right breast 01/20/2021.  Intramammary lymph node Second Look ultrasound: Benign ------------------------------------------------------------------------------------------------------------------- Current Treatment: Cycle 5 TCHP Chemo Toxicities: Mild nausea, no vomiting, on dexamethasone alone for nausea control as she prefers not to take prochlorperazine or ondansetron Mild intermittent diarrhea usually 1 loose stool per day responds to Imodium Fatigue Neuropathy: Monitoring closely.  It is no different than before.  She is icing appropriately 5.  Chemo induced anemia: Hemoglobin currently 9.6, no indication for transfusion at present  I cautioned her to let us know if the neuropathy gets any worse.  Otherwise she will  RTC in 3 weeks for Cycle 6  Total encounter time 25 minutes.Lurline Del MD Hematology/ [place] TEL [phone] FAX [phone]

## 2021-03-24 ENCOUNTER — Other Ambulatory Visit: Payer: Self-pay

## 2021-03-24 ENCOUNTER — Inpatient Hospital Stay: Payer: Medicare Other | Attending: Hematology and Oncology

## 2021-03-24 VITALS — BP 126/65 | HR 72 | Resp 16

## 2021-03-24 DIAGNOSIS — R197 Diarrhea, unspecified: Secondary | ICD-10-CM | POA: Diagnosis not present

## 2021-03-24 DIAGNOSIS — Z171 Estrogen receptor negative status [ER-]: Secondary | ICD-10-CM | POA: Insufficient documentation

## 2021-03-24 DIAGNOSIS — T451X5A Adverse effect of antineoplastic and immunosuppressive drugs, initial encounter: Secondary | ICD-10-CM | POA: Diagnosis not present

## 2021-03-24 DIAGNOSIS — R5383 Other fatigue: Secondary | ICD-10-CM | POA: Diagnosis not present

## 2021-03-24 DIAGNOSIS — Z5189 Encounter for other specified aftercare: Secondary | ICD-10-CM | POA: Diagnosis not present

## 2021-03-24 DIAGNOSIS — R110 Nausea: Secondary | ICD-10-CM | POA: Diagnosis not present

## 2021-03-24 DIAGNOSIS — D6481 Anemia due to antineoplastic chemotherapy: Secondary | ICD-10-CM | POA: Insufficient documentation

## 2021-03-24 DIAGNOSIS — Z5111 Encounter for antineoplastic chemotherapy: Secondary | ICD-10-CM | POA: Insufficient documentation

## 2021-03-24 DIAGNOSIS — C50411 Malignant neoplasm of upper-outer quadrant of right female breast: Secondary | ICD-10-CM | POA: Insufficient documentation

## 2021-03-24 DIAGNOSIS — G629 Polyneuropathy, unspecified: Secondary | ICD-10-CM | POA: Diagnosis not present

## 2021-03-24 MED ORDER — PEGFILGRASTIM-CBQV 6 MG/0.6ML ~~LOC~~ SOSY
6.0000 mg | PREFILLED_SYRINGE | Freq: Once | SUBCUTANEOUS | Status: AC
  Administered 2021-03-24: 6 mg via SUBCUTANEOUS
  Filled 2021-03-24: qty 0.6

## 2021-03-24 NOTE — Patient Instructions (Signed)

## 2021-03-25 ENCOUNTER — Other Ambulatory Visit: Payer: Self-pay | Admitting: *Deleted

## 2021-03-25 DIAGNOSIS — C50411 Malignant neoplasm of upper-outer quadrant of right female breast: Secondary | ICD-10-CM

## 2021-03-25 DIAGNOSIS — Z171 Estrogen receptor negative status [ER-]: Secondary | ICD-10-CM

## 2021-04-12 ENCOUNTER — Other Ambulatory Visit: Payer: Self-pay | Admitting: *Deleted

## 2021-04-12 DIAGNOSIS — C50411 Malignant neoplasm of upper-outer quadrant of right female breast: Secondary | ICD-10-CM

## 2021-04-12 DIAGNOSIS — Z171 Estrogen receptor negative status [ER-]: Secondary | ICD-10-CM

## 2021-04-12 NOTE — Progress Notes (Signed)
Patient Care Team: Pcp, No as PCP - General Mauro Kaufmann, RN as Oncology Nurse Navigator Rockwell Germany, RN as Oncology Nurse Navigator  DIAGNOSIS:    ICD-10-CM   1. Cardiac abnormality  Q24.9 ECHOCARDIOGRAM COMPLETE    2. Malignant neoplasm of upper-outer quadrant of right breast in female, estrogen receptor negative ([place])  C50.411    Z17.1     3. Essential (primary) hypertension  I10 ECHOCARDIOGRAM COMPLETE      SUMMARY OF ONCOLOGIC HISTORY: Oncology History  Malignant neoplasm of upper-outer quadrant of right breast in female, estrogen receptor negative ([place])  11/17/2020 Initial Diagnosis   Work-up performed at [facility]: Palpable right breast mass.  Mammogram and ultrasound 11/10/2020: Spiculated 4.9 cm mass UOQ right breast with microcalcifications, biopsy revealed IDC ER/PR negative, HER2 positive 3+ by IHC, axillary lymph node positive (2 lymph nodes were detected)   12/03/2020 PET scan   Right breast malignancy SUV 4.6, subcentimeter right level 1 and 2 axillary lymph nodes.  No distant metastatic disease.   12/21/2020 Cancer Staging   Staging form: Breast, AJCC 8th Edition - Clinical stage from 12/21/2020: Stage IIB (cT2, cN1, cM0, G3, ER-, PR-, HER2+) - Signed by Nicholas Lose, MD on 12/21/2020 Stage prefix: Initial diagnosis Histologic grading system: 3 grade system    12/29/2020 -  Chemotherapy   Patient is on Treatment Plan : BREAST  Docetaxel + Carboplatin + Trastuzumab + Pertuzumab  (TCHP) q21d        CHIEF COMPLIANT: Cycle 6 TCH Perjeta  INTERVAL HISTORY: Marie Day is a 75 y.o. with above-mentioned history of invasive ductal carcinoma of the right breast, currently on chemotherapy with [place]. She presents to the clinic today for cycle 6.  Overall she is tolerated chemotherapy reasonably well.  She did have mild nausea but did not take antiemetics.  Neuropathy is not a concern at this point.  Denies any diarrhea.  ALLERGIES:  is allergic to  sulfa antibiotics.  MEDICATIONS:  Current Outpatient Medications  Medication Sig Dispense Refill   dexamethasone (DECADRON) 4 MG tablet Take 1 tablet (4 mg total) by mouth daily. Take 1 tablet day before chemo and 1 tablet day after chemo with food 2 tablet 0   lidocaine-prilocaine (EMLA) cream Apply to affected area once 30 g 3   ondansetron (ZOFRAN) 8 MG tablet Take 1 tablet (8 mg total) by mouth 2 (two) times daily as needed (Nausea or vomiting). Start on the third day after chemotherapy. 30 tablet 1   prochlorperazine (COMPAZINE) 10 MG tablet TAKE 1 TABLET (10 MG TOTAL) BY MOUTH EVERY 6 (SIX) HOURS AS NEEDED (NAUSEA OR VOMITING). 30 tablet 1   No current facility-administered medications for this visit.    PHYSICAL EXAMINATION: ECOG PERFORMANCE STATUS: 1 - Symptomatic but completely ambulatory  Vitals:   04/13/21 0929  BP: (!) 128/53  Pulse: 80  Resp: 17  Temp: 97.9 F (36.6 C)  SpO2: 100%   Filed Weights   04/13/21 0929  Weight: 152 lb 14.4 oz (69.4 kg)     LABORATORY DATA:  I have reviewed the data as listed CMP Latest Ref Rng & Units 03/22/2021 03/01/2021 02/08/2021  Glucose 70 - 99 mg/dL 110(H) 114(H) 100(H)  BUN 8 - 23 mg/dL 7(L) 6(L) 9  Creatinine 0.44 - 1.00 mg/dL 0.67 0.70 0.64  Sodium 135 - 145 mmol/L 142 143 143  Potassium 3.5 - 5.1 mmol/L 3.4(L) 3.2(L) 3.4(L)  Chloride 98 - 111 mmol/L 107 106 107  CO2  22 - 32 mmol/L _0 Calcium 8.9 - 10.3 mg/dL 9.4 9.8 9.7  Total Protein 6.5 - 8.1 g/dL 6.4(L) 6.7 6.8  Total Bilirubin 0.3 - 1.2 mg/dL 0.4 0.3 0.3  Alkaline Phos 38 - 126 U/L 90 99 96  AST 15 - 41 U/L _1 ALT 0 - 44 U/L _2 Lab Results  Component Value Date   WBC 7.3 04/13/2021   HGB 8.8 (L) 04/13/2021   HCT 27.5 (L) 04/13/2021   MCV 94.2 04/13/2021   PLT 195 04/13/2021   NEUTROABS 3.8 04/13/2021    ASSESSMENT & PLAN:  Malignant neoplasm of upper-outer quadrant of right breast in female, estrogen receptor negative  ([place]) 11/17/2020:Work[facility]up performed at [facility]: Palpable right breast mass.  Mammogram and ultrasound 11/10/2020: Spiculated 4.9 cm mass UOQ right breast with microcalcifications, biopsy revealed IDC ER/PR negative, HER2 positive 3+ by IHC, axillary lymph node positive (2 lymph nodes were detected)   Genetics were negative   Treatment Plan based on multidisciplinary tumor board: 1. Neoadjuvant chemotherapy with TCH Perjeta 6 cycles (started 12/29/2020) followed by Herceptin Perjeta maintenance versus Kadcyla maintenance (based on response to neoadjuvant chemo) for 1 year 2. mastectomy with targeted node surgery (because of the additional positive biopsies) 3. Followed by adjuvant radiation therapy   Breast MRI 01/05/2021: Large area of non-mass enhancement 7 cm right breast UOQ.  Non-mass enhancement 2.5 cm central breast, third area of suspicious non-mass enhancement anterior UOQ 1.7 cm: MRI guided biopsy of non-mass enhancement in the central and anterolateral right breast 01/20/2021.  Intramammary lymph node Second Look ultrasound: Benign ------------------------------------------------------------------------------------------------------------------- Current Treatment: Cycle 6 TCHP Chemo Toxicities: Mild nausea Mild intermittent diarrhea usually 1 loose stool per day responds to Imodium Fatigue Neuropathy: Monitoring closely.  It is no different than before.  She is going to use ice today. 5.  Chemo induced anemia: Hemoglobin is 8.8   Arthritis symptoms have disappeared with chemotherapy MRI guided biopsy 01/28/2021: Right breast biopsy: IDC with DCIS, right breast biopsy outer anterior: DCIS  This could mean that she might need a mastectomy at the end of the neoadjuvant chemotherapy.   She has an appointment with surgery coming up and an appointment for breast MRI. Return to clinic after surgery to discuss pathology report.  I will set her up in 3 weeks with just Herceptin and  Perjeta infusion.  Orders Placed This Encounter  Procedures   ECHOCARDIOGRAM COMPLETE    Standing Status:   Future    Standing Expiration Date:   04/13/2022    Order Specific Question:   Where should this test be performed    Answer:   [place]    Order Specific Question:   Perflutren DEFINITY (image enhancing agent) should be administered unless hypersensitivity or allergy exist    Answer:   Administer Perflutren    Order Specific Question:   Reason for exam-Echo    Answer:   Chemo  Z09    Order Specific Question:   Release to patient    Answer:   Immediate    The patient has a good understanding of the overall plan. she agrees with it. she will call with any problems that may develop before the next visit here.  Total time spent: 20 mins including face to face time and time spent for planning, charting and coordination of care  Rulon Eisenmenger, MD, MPH 04/13/2021  I, Thana Ates, am acting as scribe for Dr. Loleta Dicker  Heidi Lemay.  I have reviewed the above documentation for accuracy and completeness, and I agree with the above.

## 2021-04-13 ENCOUNTER — Inpatient Hospital Stay (HOSPITAL_BASED_OUTPATIENT_CLINIC_OR_DEPARTMENT_OTHER): Payer: Medicare Other | Admitting: Hematology and Oncology

## 2021-04-13 ENCOUNTER — Encounter: Payer: Self-pay | Admitting: *Deleted

## 2021-04-13 ENCOUNTER — Other Ambulatory Visit: Payer: Self-pay | Admitting: *Deleted

## 2021-04-13 ENCOUNTER — Inpatient Hospital Stay: Payer: Medicare Other

## 2021-04-13 ENCOUNTER — Encounter: Payer: Self-pay | Admitting: Hematology and Oncology

## 2021-04-13 ENCOUNTER — Inpatient Hospital Stay (HOSPITAL_BASED_OUTPATIENT_CLINIC_OR_DEPARTMENT_OTHER): Payer: Medicare Other

## 2021-04-13 ENCOUNTER — Other Ambulatory Visit: Payer: Self-pay

## 2021-04-13 VITALS — BP 128/53 | HR 80 | Temp 97.9°F | Resp 17 | Wt 152.9 lb

## 2021-04-13 DIAGNOSIS — Q249 Congenital malformation of heart, unspecified: Secondary | ICD-10-CM | POA: Diagnosis not present

## 2021-04-13 DIAGNOSIS — C50411 Malignant neoplasm of upper-outer quadrant of right female breast: Secondary | ICD-10-CM | POA: Diagnosis not present

## 2021-04-13 DIAGNOSIS — Z171 Estrogen receptor negative status [ER-]: Secondary | ICD-10-CM | POA: Diagnosis not present

## 2021-04-13 DIAGNOSIS — Z79899 Other long term (current) drug therapy: Secondary | ICD-10-CM

## 2021-04-13 DIAGNOSIS — Z5181 Encounter for therapeutic drug level monitoring: Secondary | ICD-10-CM

## 2021-04-13 DIAGNOSIS — I10 Essential (primary) hypertension: Secondary | ICD-10-CM | POA: Diagnosis not present

## 2021-04-13 DIAGNOSIS — Z5111 Encounter for antineoplastic chemotherapy: Secondary | ICD-10-CM | POA: Diagnosis not present

## 2021-04-13 DIAGNOSIS — Z95828 Presence of other vascular implants and grafts: Secondary | ICD-10-CM

## 2021-04-13 LAB — CBC WITH DIFFERENTIAL (CANCER CENTER ONLY)
Abs Immature Granulocytes: 0.03 10*3/uL (ref 0.00–0.07)
Basophils Absolute: 0.1 10*3/uL (ref 0.0–0.1)
Basophils Relative: 1 %
Eosinophils Absolute: 0 10*3/uL (ref 0.0–0.5)
Eosinophils Relative: 0 %
HCT: 27.5 % — ABNORMAL LOW (ref 36.0–46.0)
Hemoglobin: 8.8 g/dL — ABNORMAL LOW (ref 12.0–15.0)
Immature Granulocytes: 0 %
Lymphocytes Relative: 36 %
Lymphs Abs: 2.7 10*3/uL (ref 0.7–4.0)
MCH: 30.1 pg (ref 26.0–34.0)
MCHC: 32 g/dL (ref 30.0–36.0)
MCV: 94.2 fL (ref 80.0–100.0)
Monocytes Absolute: 0.8 10*3/uL (ref 0.1–1.0)
Monocytes Relative: 11 %
Neutro Abs: 3.8 10*3/uL (ref 1.7–7.7)
Neutrophils Relative %: 52 %
Platelet Count: 195 10*3/uL (ref 150–400)
RBC: 2.92 MIL/uL — ABNORMAL LOW (ref 3.87–5.11)
RDW: 17.6 % — ABNORMAL HIGH (ref 11.5–15.5)
WBC Count: 7.3 10*3/uL (ref 4.0–10.5)
nRBC: 0 % (ref 0.0–0.2)

## 2021-04-13 LAB — CMP (CANCER CENTER ONLY)
ALT: 16 U/L (ref 0–44)
AST: 16 U/L (ref 15–41)
Albumin: 3.7 g/dL (ref 3.5–5.0)
Alkaline Phosphatase: 76 U/L (ref 38–126)
Anion gap: 8 (ref 5–15)
BUN: 9 mg/dL (ref 8–23)
CO2: 26 mmol/L (ref 22–32)
Calcium: 9.2 mg/dL (ref 8.9–10.3)
Chloride: 109 mmol/L (ref 98–111)
Creatinine: 0.7 mg/dL (ref 0.44–1.00)
GFR, Estimated: >60 mL/min (ref >60)
Glucose, Bld: 104 mg/dL — ABNORMAL HIGH (ref 70–99)
Potassium: 3.4 mmol/L — ABNORMAL LOW (ref 3.5–5.1)
Sodium: 143 mmol/L (ref 135–145)
Total Bilirubin: 0.3 mg/dL (ref 0.3–1.2)
Total Protein: 6.2 g/dL — ABNORMAL LOW (ref 6.5–8.1)

## 2021-04-13 MED ORDER — SODIUM CHLORIDE 0.9% FLUSH
10.0000 mL | INTRAVENOUS | Status: DC | PRN
  Administered 2021-04-13: 10 mL

## 2021-04-13 MED ORDER — SODIUM CHLORIDE 0.9 % IV SOLN
10.0000 mg | Freq: Once | INTRAVENOUS | Status: AC
  Administered 2021-04-13: 10 mg via INTRAVENOUS
  Filled 2021-04-13: qty 10

## 2021-04-13 MED ORDER — SODIUM CHLORIDE 0.9 % IV SOLN
75.0000 mg/m2 | Freq: Once | INTRAVENOUS | Status: AC
  Administered 2021-04-13: 140 mg via INTRAVENOUS
  Filled 2021-04-13: qty 14

## 2021-04-13 MED ORDER — ACETAMINOPHEN 325 MG PO TABS
650.0000 mg | ORAL_TABLET | Freq: Once | ORAL | Status: AC
  Administered 2021-04-13: 650 mg via ORAL
  Filled 2021-04-13: qty 2

## 2021-04-13 MED ORDER — PALONOSETRON HCL INJECTION 0.25 MG/5ML
0.2500 mg | Freq: Once | INTRAVENOUS | Status: AC
  Administered 2021-04-13: 0.25 mg via INTRAVENOUS
  Filled 2021-04-13: qty 5

## 2021-04-13 MED ORDER — SODIUM CHLORIDE 0.9% FLUSH
10.0000 mL | Freq: Once | INTRAVENOUS | Status: AC
Start: 2021-04-13 — End: 2021-04-13
  Administered 2021-04-13: 10 mL

## 2021-04-13 MED ORDER — SODIUM CHLORIDE 0.9 % IV SOLN
420.0000 mg | Freq: Once | INTRAVENOUS | Status: AC
  Administered 2021-04-13: 420 mg via INTRAVENOUS
  Filled 2021-04-13: qty 14

## 2021-04-13 MED ORDER — SODIUM CHLORIDE 0.9 % IV SOLN: Freq: Once | INTRAVENOUS | Status: AC

## 2021-04-13 MED ORDER — HEPARIN SOD (PORK) LOCK FLUSH 100 UNIT/ML IV SOLN
500.0000 [IU] | Freq: Once | INTRAVENOUS | Status: AC | PRN
  Administered 2021-04-13: 500 [IU]

## 2021-04-13 MED ORDER — SODIUM CHLORIDE 0.9 % IV SOLN
150.0000 mg | Freq: Once | INTRAVENOUS | Status: AC
  Administered 2021-04-13: 150 mg via INTRAVENOUS
  Filled 2021-04-13: qty 150

## 2021-04-13 MED ORDER — TRASTUZUMAB-DKST CHEMO 150 MG IV SOLR
6.0000 mg/kg | Freq: Once | INTRAVENOUS | Status: AC
  Administered 2021-04-13: 441 mg via INTRAVENOUS
  Filled 2021-04-13: qty 21

## 2021-04-13 MED ORDER — SODIUM CHLORIDE 0.9 % IV SOLN
498.0000 mg | Freq: Once | INTRAVENOUS | Status: AC
  Administered 2021-04-13: 500 mg via INTRAVENOUS
  Filled 2021-04-13: qty 50

## 2021-04-13 MED ORDER — DIPHENHYDRAMINE HCL 25 MG PO CAPS
50.0000 mg | ORAL_CAPSULE | Freq: Once | ORAL | Status: AC
  Administered 2021-04-13: 50 mg via ORAL
  Filled 2021-04-13: qty 2

## 2021-04-13 NOTE — Assessment & Plan Note (Signed)
11/17/2020:Work-up performed at [facility]: Palpable right breast mass. Mammogram and ultrasound 11/10/2020: Spiculated 4.9 cm mass UOQ right breast with microcalcifications, biopsy revealed IDC ER/PR negative, HER2 positive 3+ by IHC, axillary lymph node positive (2 lymph nodes were detected)  Genetics were negative  Treatment Planbased on multidisciplinary tumor board: 1. Neoadjuvant chemotherapy with [place] Perjeta 6 cycles(started 12/29/2020)followed by Herceptin Perjeta maintenance versus Kadcyla maintenance (based on response to neoadjuvant chemo) for 1 year 2.mastectomywithtargeted node surgery(because of the additional positive biopsies) 3. Followed by adjuvant radiation therapy  Breast MRI 01/05/2021: Large area of non-mass enhancement 7 cm right breast UOQ. Non-mass enhancement 2.5 cm central breast, third area of suspicious non-mass enhancement anterior UOQ 1.7 cm: MRI guided biopsy of non-mass enhancement in the central and anterolateral right breast 01/20/2021. Intramammary lymph node Second Look ultrasound: Benign ------------------------------------------------------------------------------------------------------------------- Current Treatment:Cycle5TCHP Chemo Toxicities: 1. Mild nausea: Prilosec did not help her.  She is back to taking her nausea medications. 2. Mild intermittent diarrhea usually 1 loose stool per day responds to Imodium 3. Fatigue 4. Neuropathy: Monitoring closely. It is no different than before. She is going to use ice today. 5.  Chemo induced anemia: Hemoglobin is 10.2: Stable  Arthritis symptoms have disappeared with chemotherapy MRI guided biopsy9/12/2020: Right breast biopsy: IDC with DCIS, right breast biopsy outer anterior: DCIS  This could mean that she might need a mastectomy at the end of the neoadjuvant chemotherapy.  RTC in3weeksfor Cycle6 Will inform surgery to plan the next steps. We will order breast MRI to be done  after the last cycle of chemo.  She will be presented in the tumor board.

## 2021-04-13 NOTE — Patient Instructions (Signed)
[facility] ONCOLOG[facility]  Discharge Instructions: Thank you for choosing [facility] to provide your oncology and hematology care.   If you have a lab appointment with the [facility], please go directly to the [facility] and check in at the registration area.   Wear comfortable clothing and clothing appropriate for easy access to any Portacath or PICC line.   We strive to give you quality time with your provider. You may need to reschedule your appointment if you arrive late (15 or more minutes).  Arriving late affects you and other patients whose appointments are after yours.  Also, if you miss three or more appointments without notifying the office, you may be dismissed from the clinic at the provider's discretion.      For prescription refill requests, have your pharmacy contact our office and allow 72 hours for refills to be completed.    Today you received the following chemotherapy and/or immunotherapy agents: trastuzumab, pertuzumab, docetaxel, and carboplatin      To help prevent nausea and vomiting after your treatment, we encourage you to take your nausea medication as directed.  BELOW ARE SYMPTOMS THAT SHOULD BE REPORTED IMMEDIATELY: *FEVER GREATER THAN 100.4 F (38 C) OR HIGHER *CHILLS OR SWEATING *NAUSEA AND VOMITING THAT IS NOT CONTROLLED WITH YOUR NAUSEA MEDICATION *UNUSUAL SHORTNESS OF BREATH *UNUSUAL BRUISING OR BLEEDING *URINARY PROBLEMS (pain or burning when urinating, or frequent urination) *BOWEL PROBLEMS (unusual diarrhea, constipation, pain near the anus) TENDERNESS IN MOUTH AND THROAT WITH OR WITHOUT PRESENCE OF ULCERS (sore throat, sores in mouth, or a toothache) UNUSUAL RASH, SWELLING OR PAIN  UNUSUAL VAGINAL DISCHARGE OR ITCHING   Items with * indicate a potential emergency and should be followed up as soon as possible or go to the Emergency Department if any problems should occur.  Please show the CHEMOTHERAPY ALERT CARD  or IMMUNOTHERAPY ALERT CARD at check-in to the Emergency Department and triage nurse.  Should you have questions after your visit or need to cancel or reschedule your appointment, please contact [facility]  Dept: [phone]  and follow the prompts.  Office hours are 8:00 a.m. to 4:30 p.m. Monday - Friday. Please note that voicemails left after 4:00 p.m. may not be returned until the following business day.  We are closed weekends and major holidays. You have access to a nurse at all times for urgent questions. Please call the main number to the clinic Dept: [phone] and follow the prompts.   For any non-urgent questions, you may also contact your provider using MyChart. We now offer e-Visits for anyone 73 and older to request care online for non-urgent symptoms. For details visit [email or web address].   Also download the MyChart app! Go to the app store, search "MyChart", open the app, select [facility], and log in with your MyChart username and password.  Due to Covid, a mask is required upon entering the hospital/clinic. If you do not have a mask, one will be given to you upon arrival. For doctor visits, patients may have 1 support person aged 95 or older with them. For treatment visits, patients cannot have anyone with them due to current Covid guidelines and our immunocompromised population.

## 2021-04-13 NOTE — Progress Notes (Signed)
Ok to treat with Echo from August, Kelsey D- placing order for additional Echo

## 2021-04-14 ENCOUNTER — Inpatient Hospital Stay: Payer: Medicare Other

## 2021-04-14 ENCOUNTER — Ambulatory Visit
Admission: RE | Admit: 2021-04-14 | Discharge: 2021-04-14 | Disposition: A | Discharge status: Home / Self Care | Payer: Medicare Other | Source: Ambulatory Visit | Attending: Hematology and Oncology | Admitting: Hematology and Oncology

## 2021-04-14 ENCOUNTER — Encounter: Payer: Self-pay | Admitting: Hematology and Oncology

## 2021-04-14 VITALS — BP 125/55 | HR 66 | Temp 98.9°F | Resp 16

## 2021-04-14 DIAGNOSIS — Z5111 Encounter for antineoplastic chemotherapy: Secondary | ICD-10-CM | POA: Diagnosis not present

## 2021-04-14 DIAGNOSIS — C50411 Malignant neoplasm of upper-outer quadrant of right female breast: Secondary | ICD-10-CM

## 2021-04-14 DIAGNOSIS — Z171 Estrogen receptor negative status [ER-]: Secondary | ICD-10-CM

## 2021-04-14 MED ORDER — PEGFILGRASTIM-CBQV 6 MG/0.6ML ~~LOC~~ SOSY
6.0000 mg | PREFILLED_SYRINGE | Freq: Once | SUBCUTANEOUS | Status: AC
  Administered 2021-04-14: 6 mg via SUBCUTANEOUS
  Filled 2021-04-14: qty 0.6

## 2021-04-14 MED ORDER — GADOBUTROL 1 MMOL/ML IV SOLN
7.0000 mL | Freq: Once | INTRAVENOUS | Status: AC | PRN
  Administered 2021-04-14: 7 mL via INTRAVENOUS

## 2021-04-14 NOTE — Patient Instructions (Signed)

## 2021-04-16 ENCOUNTER — Encounter: Payer: Self-pay | Admitting: *Deleted

## 2021-04-26 ENCOUNTER — Ambulatory Visit: Payer: Self-pay | Admitting: Surgery

## 2021-04-26 DIAGNOSIS — C50911 Malignant neoplasm of unspecified site of right female breast: Secondary | ICD-10-CM

## 2021-04-28 ENCOUNTER — Other Ambulatory Visit: Payer: Self-pay

## 2021-04-28 ENCOUNTER — Encounter: Payer: Self-pay | Admitting: *Deleted

## 2021-04-28 ENCOUNTER — Ambulatory Visit (INDEPENDENT_AMBULATORY_CARE_PROVIDER_SITE_OTHER): Payer: Medicare Other | Admitting: Plastic Surgery

## 2021-04-28 ENCOUNTER — Institutional Professional Consult (permissible substitution): Payer: Medicare Other | Admitting: Plastic Surgery

## 2021-04-28 VITALS — BP 122/67 | HR 88 | Ht 65.5 in | Wt 149.4 lb

## 2021-04-28 DIAGNOSIS — Z171 Estrogen receptor negative status [ER-]: Secondary | ICD-10-CM | POA: Diagnosis not present

## 2021-04-28 DIAGNOSIS — C50411 Malignant neoplasm of upper-outer quadrant of right female breast: Secondary | ICD-10-CM | POA: Diagnosis not present

## 2021-04-28 NOTE — Progress Notes (Signed)
   Referring Provider Erroll Luna, MD [address] [place],  [place] [address]   CC:  Chief Complaint  Patient presents with   Advice Only      Marie Day is an 75 y.o. female.  HPI: Patient presents to discuss her options for breast reconstruction.  She is planning a unilateral right sided mastectomy for breast cancer.  She has been undergoing neoadjuvant chemotherapy.  Her breast surgeon is Dr. Brantley Stage.  She is uncertain whether or not she would like reconstruction.  Allergies  Allergen Reactions   Sulfa Antibiotics     Pt reports "foggy brain, seeing spots"    Outpatient Encounter Medications as of 04/28/2021  Medication Sig   dexamethasone (DECADRON) 4 MG tablet Take 1 tablet (4 mg total) by mouth daily. Take 1 tablet day before chemo and 1 tablet day after chemo with food   lidocaine-prilocaine (EMLA) cream Apply to affected area once   ondansetron (ZOFRAN) 8 MG tablet Take 1 tablet (8 mg total) by mouth 2 (two) times daily as needed (Nausea or vomiting). Start on the third day after chemotherapy.   prochlorperazine (COMPAZINE) 10 MG tablet TAKE 1 TABLET (10 MG TOTAL) BY MOUTH EVERY 6 (SIX) HOURS AS NEEDED (NAUSEA OR VOMITING).   No facility-administered encounter medications on file as of 04/28/2021.     No past medical history on file.  No family history on file.  Social History   Social History Narrative   Not on file  Denies tobacco use  Review of Systems General: Denies fevers, chills, weight loss CV: Denies chest pain, shortness of breath, palpitations  Physical Exam Vitals with BMI 04/28/2021 04/14/2021 04/13/2021  Height 5' 5.5" - -  Weight 149 lbs 6 oz - 152 lbs 14 oz  BMI 70.17 - 79.39  Systolic 030 092 330  Diastolic 67 55 53  Pulse 88 66 80    General:  No acute distress,  Alert and oriented, Non-Toxic, Normal speech and affect Breast: She has grade 3 ptosis on both sides.  The right side is larger than the left.  No obvious  scars.  Base width 12 cm.  Assessment/Plan Had a long discussion with the patient about her options.  We briefly discussed autologous reconstruction which she did not appear to be interested in.  We discussed implant-based reconstruction with a tissue expander followed by gel implant.  We discussed that this would be a stage reconstruction.  We discussed that there would be options for symmetry related procedures on the left side should she want them.  We discussed risk of the procedure that include bleeding, infection, damage to surrounding structures and need for additional procedures.  We discussed anticipated recovery time and the need for drains.  All her questions were answered we will plan to be available for coordination in the event that she wants to move forward with reconstruction.  Cindra Presume 04/28/2021, 4:25 PM

## 2021-04-29 ENCOUNTER — Encounter: Payer: Self-pay | Admitting: Rehabilitation

## 2021-04-29 ENCOUNTER — Ambulatory Visit: Payer: Medicare Other | Attending: Surgery | Admitting: Rehabilitation

## 2021-04-29 DIAGNOSIS — Z171 Estrogen receptor negative status [ER-]: Secondary | ICD-10-CM

## 2021-04-29 DIAGNOSIS — C50411 Malignant neoplasm of upper-outer quadrant of right female breast: Secondary | ICD-10-CM | POA: Diagnosis not present

## 2021-04-29 DIAGNOSIS — R293 Abnormal posture: Secondary | ICD-10-CM | POA: Diagnosis present

## 2021-04-29 DIAGNOSIS — M6281 Muscle weakness (generalized): Secondary | ICD-10-CM | POA: Diagnosis present

## 2021-04-29 NOTE — Therapy (Signed)
[facility] @ [address] [place], [place], [address] Phone: [phone]   Fax:  [phone]  Physical Therapy Evaluation  Patient Details  Name: Marie Day MRN: [id number] Date of Birth: January 18, 1946 Referring Provider (PT): Dr. Brantley Stage   Encounter Date: 04/29/2021   PT End of Session - 04/29/21 1134     Visit Number 1    Number of Visits 2    Date for PT Re-Evaluation 06/24/21    PT Start Time 1100    PT Stop Time 1130    PT Time Calculation (min) 30 min    Activity Tolerance Patient tolerated treatment well    Behavior During Therapy [facility] for tasks assessed/performed             History reviewed. No pertinent past medical history.  History reviewed. No pertinent surgical history.  There were no vitals filed for this visit.    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  Surgery not scheduled yet.  Will most likely get an expander placed.    Pertinent History Neoadjuvant chemotherapy completed TCH Perjeta x 6 cycles followed by Herceptin Perjeta maintenance. Will have [name] with Dr. Brantley Stage followed by radiation.    Limitations --   I am very fatigued from chemotherapy   Patient Stated Goals get information from providers    Currently in Pain? No/denies                [facility] PT Assessment - 04/29/21 0001       Assessment   Medical Diagnosis [name] cancer    Referring Provider (PT) Dr. Brantley Stage    Onset Date/Surgical Date 04/29/21    Hand Dominance Right      Precautions   Precaution Comments active cancer      Balance Screen   Has the patient fallen in the past 6 months No    Has the patient had a decrease in activity level because of a fear of falling?  No    Is the patient reluctant to leave their home because of a fear of falling?  No      Home Ecologist residence    Living  Arrangements Spouse/significant other    Available Help at Discharge Family      Prior Function   Level of [place] Retired    Leisure I like to walk      Cognition   Overall Cognitive Status Within Functional Limits for tasks assessed      ROM / Strength   AROM / PROM / Strength AROM      AROM   AROM Assessment Site Shoulder    Right/Left Shoulder Right;Left    Right Shoulder Extension 56 Degrees    Right Shoulder Flexion 160 Degrees    Right Shoulder ABduction 163 Degrees    Right Shoulder External Rotation 95 Degrees               LYMPHEDEMA/ONCOLOGY QUESTIONNAIRE - 04/29/21 0001       Lymphedema Assessments   Lymphedema Assessments Upper extremities      Right Upper Extremity Lymphedema   10 cm Proximal to Olecranon Process 28 cm    Olecranon Process 25 cm    10 cm Proximal to Ulnar Styloid Process 20 cm    Just Proximal to Ulnar Styloid Process 16 cm  Across Hand at [employer] 19.5 cm    At [place] of 2nd Digit 6.5 cm             L-DEX FLOWSHEETS - 04/29/21 1100       L-DEX LYMPHEDEMA SCREENING   Measurement Type Unilateral    L-DEX MEASUREMENT EXTREMITY Upper Extremity    POSITION  Standing    DOMINANT SIDE Right    At Risk Side Right    BASELINE SCORE (UNILATERAL) -3.8    Comment low quality Rt LE noted by SOZO                    Objective measurements completed on examination: See above findings.                PT Education - 04/29/21 1133     Education Details post op exercises, briefly lymphedema, SOZO surveillance    Person(s) Educated Patient    Methods Explanation;Demonstration;Handout    Comprehension Verbalized understanding;Returned demonstration                 PT Long Term Goals - 04/29/21 1139       PT LONG TERM GOAL #1   Title Pt will return to baseline AROM    Time 8    Period Weeks    Status New      PT LONG TERM GOAL #2   Title Pt will be scheduled  for ABC class or risk reduction education    Time 8    Period Weeks    Status New             Breast Clinic Goals - 04/29/21 1139       Patient will be able to verbalize understanding of pertinent lymphedema risk reduction practices relevant to her diagnosis specifically related to skin care.   Status Achieved      Patient will be able to return demonstrate and/or verbalize understanding of the post-op home exercise program related to regaining shoulder range of motion.   Status Achieved      Patient will be able to verbalize understanding of the importance of attending the postoperative After Breast Cancer Class for further lymphedema risk reduction education and therapeutic exercise.   Status Achieved                   Plan - 04/29/21 1134     Clinical Impression Statement obtained pt baselines for [name] which is unscheduled currently.  Pt completed chemotherapy a few weeks ago and has general CIPN and fatigue.  Discussed getting back to walking and performing sit to stand and overhead press motions to work on before surgery day.  Pt was active before but has not returned to walking yet. pt will return post surgery for follow up    Personal Factors and Comorbidities Age;Comorbidity 3+    Comorbidities CIPN, fatigue, chemotherapy    Examination-Activity Limitations Stand    Examination-Participation Restrictions Cleaning;Yard Work;Community Activity    Stability/Clinical Decision Making Stable/Uncomplicated    Clinical Decision Making Low    Rehab Potential Excellent    PT Frequency --   post op visit   PT Duration 8 weeks    PT Treatment/Interventions ADLs/Self Care Home Management;Therapeutic exercise;Patient/family education;Manual techniques    PT Next Visit Plan post op check, schedule out SOZO and ABC class or talk in person about risk reduction    PT Home Exercise Plan post op    Consulted and Agree with  Plan of Care Patient             Patient  will benefit from skilled therapeutic intervention in order to improve the following deficits and impairments:  Decreased activity tolerance, Decreased knowledge of precautions  Visit Diagnosis: Malignant neoplasm of upper-outer quadrant of right breast in female, estrogen receptor negative ([place])  Muscle weakness (generalized)  Abnormal posture     Problem List Patient Active Problem List   Diagnosis Date Noted   Port-A-Cath in place 01/18/2021   Malignant neoplasm of upper-outer quadrant of right breast in female, estrogen receptor negative ([place]) 12/21/2020    Stark Bray, PT 04/29/2021, 11:40 AM  [facility] @ [address] [place], [place], [address] Phone: [phone]   Fax:  [phone]  Name: RAYONA SARDINHA MRN: [id number] Date of Birth: 04/12/1946

## 2021-04-29 NOTE — Patient Instructions (Signed)
Physical Therapy Information for After Breast Cancer Surgery/Treatment:  Lymphedema is a swelling condition that you may be at risk for in your arm if you have lymph nodes removed from the armpit area.  After a sentinel node biopsy, the risk is approximately 5-9% and is higher after an axillary node dissection.  There is treatment available for this condition and it is not life-threatening.  Contact your physician or physical therapist with concerns. You may begin the 4 shoulder/posture exercises (see additional sheet) when permitted by your physician (typically a week after surgery).  If you have drains, you may need to wait until those are removed before beginning range of motion exercises.  A general recommendation is to not lift your arms above shoulder height until drains are removed.  These exercises should be done to your tolerance and gently.  This is not a "no pain/no gain" type of recovery so listen to your body and stretch into the range of motion that you can tolerate, stopping if you have pain.  If you are having immediate reconstruction, ask your plastic surgeon about doing exercises as he or she may want you to wait. We encourage you to attend the free one time ABC (After Breast Cancer) class offered by [facility].  You will learn information related to lymphedema risk, prevention and treatment and additional exercises to regain mobility following surgery.  You can call [phone] for more information.  This is offered the 1st and 3rd Monday of each month.  You only attend the class one time. While undergoing any medical procedure or treatment, try to avoid blood pressure being taken or needle sticks from occurring on the arm on the side of cancer.   This recommendation begins after surgery and continues for the rest of your life.  This may help reduce your risk of getting lymphedema (swelling in your arm). An excellent resource for those seeking information on  lymphedema is the National Lymphedema Network's web site. It can be accessed at [place].org If you notice swelling in your hand, arm or breast at any time following surgery (even if it is many years from now), please contact your doctor or physical therapist to discuss this.  Lymphedema can be treated at any time but it is easier for you if it is treated early on.  If you feel like your shoulder motion is not returning to normal in a reasonable amount of time, please contact your surgeon or physical therapist.  ABC CLASS After Breast Cancer Class  After Breast Cancer Class is a specially designed exercise class to assist you in a safe recover after having breast cancer surgery.  In this class you will learn how to get back to full function whether your drains were just removed or if you had surgery a month ago.  This class is FREE and space is limited. For more information or to register for the next available class, call [phone].  Class Goals  Understand specific stretches to improve the flexibility of you chest and shoulder. Learn ways to safely strengthen your upper body and improve your posture. Understand the warning signs of infection and why you may be at risk for an arm infection. Learn about Lymphedema and prevention.  ** You do not attend this class until after surgery.  Drains must be removed to participate  Patient was instructed today in a home exercise program today for post op shoulder range of motion. These included active assist shoulder flexion in sitting,  scapular retraction, wall walking with shoulder abduction, and hands behind head external rotation.  She was encouraged to do these twice a day, holding 3 seconds and repeating 5 times when permitted by her physician.

## 2021-04-30 ENCOUNTER — Ambulatory Visit (HOSPITAL_COMMUNITY)
Admission: RE | Admit: 2021-04-30 | Discharge: 2021-04-30 | Disposition: A | Discharge status: Home / Self Care | Payer: Medicare Other | Source: Ambulatory Visit | Attending: Hematology and Oncology | Admitting: Hematology and Oncology

## 2021-04-30 ENCOUNTER — Encounter: Payer: Self-pay | Admitting: Hematology and Oncology

## 2021-04-30 ENCOUNTER — Other Ambulatory Visit: Payer: Self-pay

## 2021-04-30 DIAGNOSIS — Z5181 Encounter for therapeutic drug level monitoring: Secondary | ICD-10-CM | POA: Diagnosis present

## 2021-04-30 DIAGNOSIS — Z0189 Encounter for other specified special examinations: Secondary | ICD-10-CM | POA: Diagnosis not present

## 2021-04-30 DIAGNOSIS — I358 Other nonrheumatic aortic valve disorders: Secondary | ICD-10-CM | POA: Insufficient documentation

## 2021-04-30 DIAGNOSIS — Z79899 Other long term (current) drug therapy: Secondary | ICD-10-CM | POA: Insufficient documentation

## 2021-04-30 DIAGNOSIS — Z01818 Encounter for other preprocedural examination: Secondary | ICD-10-CM | POA: Diagnosis not present

## 2021-04-30 DIAGNOSIS — C50919 Malignant neoplasm of unspecified site of unspecified female breast: Secondary | ICD-10-CM | POA: Diagnosis not present

## 2021-04-30 DIAGNOSIS — I700 Atherosclerosis of aorta: Secondary | ICD-10-CM | POA: Diagnosis not present

## 2021-04-30 LAB — ECHOCARDIOGRAM COMPLETE
Area-P 1/2: 2.79 cm2
S' Lateral: 3.6 cm

## 2021-04-30 NOTE — Progress Notes (Signed)
  Echocardiogram 2D Echocardiogram has been performed.  Marie Day M 04/30/2021, 9:42 AM

## 2021-05-03 ENCOUNTER — Telehealth: Payer: Self-pay | Admitting: Plastic Surgery

## 2021-05-03 NOTE — Telephone Encounter (Signed)
Attempted to call patient to see if she had decided on whether she would like to have reconstructive surgery. Unable to leave message and phone just rang. Sent patient mychart message requesting callback.

## 2021-05-04 ENCOUNTER — Other Ambulatory Visit: Payer: Medicare Other

## 2021-05-04 ENCOUNTER — Other Ambulatory Visit: Payer: Self-pay

## 2021-05-04 ENCOUNTER — Inpatient Hospital Stay: Payer: Medicare Other

## 2021-05-04 ENCOUNTER — Encounter: Payer: Self-pay | Admitting: Hematology and Oncology

## 2021-05-04 ENCOUNTER — Inpatient Hospital Stay: Payer: Medicare Other | Attending: Hematology and Oncology

## 2021-05-04 ENCOUNTER — Encounter: Payer: Self-pay | Admitting: *Deleted

## 2021-05-04 VITALS — BP 121/61 | HR 87 | Temp 98.1°F | Resp 18 | Wt 150.5 lb

## 2021-05-04 DIAGNOSIS — Z5111 Encounter for antineoplastic chemotherapy: Secondary | ICD-10-CM | POA: Diagnosis not present

## 2021-05-04 DIAGNOSIS — Z171 Estrogen receptor negative status [ER-]: Secondary | ICD-10-CM

## 2021-05-04 DIAGNOSIS — C50411 Malignant neoplasm of upper-outer quadrant of right female breast: Secondary | ICD-10-CM | POA: Diagnosis not present

## 2021-05-04 DIAGNOSIS — Z95828 Presence of other vascular implants and grafts: Secondary | ICD-10-CM

## 2021-05-04 LAB — CBC WITH DIFFERENTIAL (CANCER CENTER ONLY)
Abs Immature Granulocytes: 0.03 10*3/uL (ref 0.00–0.07)
Basophils Absolute: 0.1 10*3/uL (ref 0.0–0.1)
Basophils Relative: 1 %
Eosinophils Absolute: 0 10*3/uL (ref 0.0–0.5)
Eosinophils Relative: 0 %
HCT: 27.8 % — ABNORMAL LOW (ref 36.0–46.0)
Hemoglobin: 9 g/dL — ABNORMAL LOW (ref 12.0–15.0)
Immature Granulocytes: 1 %
Lymphocytes Relative: 25 %
Lymphs Abs: 1.6 10*3/uL (ref 0.7–4.0)
MCH: 31 pg (ref 26.0–34.0)
MCHC: 32.4 g/dL (ref 30.0–36.0)
MCV: 95.9 fL (ref 80.0–100.0)
Monocytes Absolute: 0.6 10*3/uL (ref 0.1–1.0)
Monocytes Relative: 10 %
Neutro Abs: 4.1 10*3/uL (ref 1.7–7.7)
Neutrophils Relative %: 63 %
Platelet Count: 233 10*3/uL (ref 150–400)
RBC: 2.9 MIL/uL — ABNORMAL LOW (ref 3.87–5.11)
RDW: 14.9 % (ref 11.5–15.5)
WBC Count: 6.4 10*3/uL (ref 4.0–10.5)
nRBC: 0 % (ref 0.0–0.2)

## 2021-05-04 LAB — CMP (CANCER CENTER ONLY)
ALT: 13 U/L (ref 0–44)
AST: 12 U/L — ABNORMAL LOW (ref 15–41)
Albumin: 3.6 g/dL (ref 3.5–5.0)
Alkaline Phosphatase: 97 U/L (ref 38–126)
Anion gap: 9 (ref 5–15)
BUN: 9 mg/dL (ref 8–23)
CO2: 26 mmol/L (ref 22–32)
Calcium: 9 mg/dL (ref 8.9–10.3)
Chloride: 107 mmol/L (ref 98–111)
Creatinine: 0.69 mg/dL (ref 0.44–1.00)
GFR, Estimated: >60 mL/min (ref >60)
Glucose, Bld: 99 mg/dL (ref 70–99)
Potassium: 3.7 mmol/L (ref 3.5–5.1)
Sodium: 142 mmol/L (ref 135–145)
Total Bilirubin: 0.3 mg/dL (ref 0.3–1.2)
Total Protein: 6.3 g/dL — ABNORMAL LOW (ref 6.5–8.1)

## 2021-05-04 MED ORDER — SODIUM CHLORIDE 0.9% FLUSH
10.0000 mL | Freq: Once | INTRAVENOUS | Status: AC
Start: 2021-05-04 — End: 2021-05-04
  Administered 2021-05-04: 10 mL

## 2021-05-04 MED ORDER — ACETAMINOPHEN 325 MG PO TABS
650.0000 mg | ORAL_TABLET | Freq: Once | ORAL | Status: AC
  Administered 2021-05-04: 650 mg via ORAL
  Filled 2021-05-04: qty 2

## 2021-05-04 MED ORDER — DIPHENHYDRAMINE HCL 25 MG PO CAPS
50.0000 mg | ORAL_CAPSULE | Freq: Once | ORAL | Status: AC
  Administered 2021-05-04: 50 mg via ORAL
  Filled 2021-05-04: qty 2

## 2021-05-04 MED ORDER — SODIUM CHLORIDE 0.9% FLUSH
10.0000 mL | INTRAVENOUS | Status: DC | PRN
  Administered 2021-05-04: 10 mL

## 2021-05-04 MED ORDER — SODIUM CHLORIDE 0.9 % IV SOLN: Freq: Once | INTRAVENOUS | Status: AC

## 2021-05-04 MED ORDER — TRASTUZUMAB-DKST CHEMO 150 MG IV SOLR
6.0000 mg/kg | Freq: Once | INTRAVENOUS | Status: AC
  Administered 2021-05-04: 441 mg via INTRAVENOUS
  Filled 2021-05-04: qty 21

## 2021-05-04 MED ORDER — HEPARIN SOD (PORK) LOCK FLUSH 100 UNIT/ML IV SOLN
500.0000 [IU] | Freq: Once | INTRAVENOUS | Status: AC | PRN
  Administered 2021-05-04: 500 [IU]

## 2021-05-04 MED ORDER — SODIUM CHLORIDE 0.9 % IV SOLN
420.0000 mg | Freq: Once | INTRAVENOUS | Status: AC
  Administered 2021-05-04: 420 mg via INTRAVENOUS
  Filled 2021-05-04: qty 14

## 2021-05-04 NOTE — Patient Instructions (Signed)
[facility] ONCOLOGY  Discharge Instructions: Thank you for choosing [facility] to provide your oncology and hematology care.   If you have a lab appointment with the [facility], please go directly to the [facility] and check in at the registration area.   Wear comfortable clothing and clothing appropriate for easy access to any Portacath or PICC line.   We strive to give you quality time with your provider. You may need to reschedule your appointment if you arrive late (15 or more minutes).  Arriving late affects you and other patients whose appointments are after yours.  Also, if you miss three or more appointments without notifying the office, you may be dismissed from the clinic at the provider's discretion.      For prescription refill requests, have your pharmacy contact our office and allow 72 hours for refills to be completed.    Today you received the following chemotherapy and/or immunotherapy agents trastuzamab and perjeta      To help prevent nausea and vomiting after your treatment, we encourage you to take your nausea medication as directed.  BELOW ARE SYMPTOMS THAT SHOULD BE REPORTED IMMEDIATELY: *FEVER GREATER THAN 100.4 F (38 C) OR HIGHER *CHILLS OR SWEATING *NAUSEA AND VOMITING THAT IS NOT CONTROLLED WITH YOUR NAUSEA MEDICATION *UNUSUAL SHORTNESS OF BREATH *UNUSUAL BRUISING OR BLEEDING *URINARY PROBLEMS (pain or burning when urinating, or frequent urination) *BOWEL PROBLEMS (unusual diarrhea, constipation, pain near the anus) TENDERNESS IN MOUTH AND THROAT WITH OR WITHOUT PRESENCE OF ULCERS (sore throat, sores in mouth, or a toothache) UNUSUAL RASH, SWELLING OR PAIN  UNUSUAL VAGINAL DISCHARGE OR ITCHING   Items with * indicate a potential emergency and should be followed up as soon as possible or go to the Emergency Department if any problems should occur.  Please show the CHEMOTHERAPY ALERT CARD or IMMUNOTHERAPY ALERT CARD at  check-in to the Emergency Department and triage nurse.  Should you have questions after your visit or need to cancel or reschedule your appointment, please contact [facility]  Dept: [phone]  and follow the prompts.  Office hours are 8:00 a.m. to 4:30 p.m. Monday - Friday. Please note that voicemails left after 4:00 p.m. may not be returned until the following business day.  We are closed weekends and major holidays. You have access to a nurse at all times for urgent questions. Please call the main number to the clinic Dept: [phone] and follow the prompts.   For any non-urgent questions, you may also contact your provider using MyChart. We now offer e-Visits for anyone 52 and older to request care online for non-urgent symptoms. For details visit [email or web address].   Also download the MyChart app! Go to the app store, search "MyChart", open the app, select [facility], and log in with your MyChart username and password.  Due to Covid, a mask is required upon entering the hospital/clinic. If you do not have a mask, one will be given to you upon arrival. For doctor visits, patients may have 1 support person aged 9 or older with them. For treatment visits, patients cannot have anyone with them due to current Covid guidelines and our immunocompromised population.

## 2021-05-10 ENCOUNTER — Encounter: Payer: Self-pay | Admitting: *Deleted

## 2021-05-10 ENCOUNTER — Telehealth: Payer: Self-pay | Admitting: *Deleted

## 2021-05-10 NOTE — Telephone Encounter (Signed)
Called pt x2 to determine if pt has decided on type of sx. No answer. Unable to leave vm. Reached out to physician team regarding inability to reach pt Mychart msg sent to pt.

## 2021-05-11 ENCOUNTER — Other Ambulatory Visit: Payer: Self-pay | Admitting: Surgery

## 2021-05-11 ENCOUNTER — Encounter: Payer: Self-pay | Admitting: *Deleted

## 2021-05-11 DIAGNOSIS — C50911 Malignant neoplasm of unspecified site of right female breast: Secondary | ICD-10-CM

## 2021-05-19 ENCOUNTER — Other Ambulatory Visit: Payer: Self-pay

## 2021-05-24 NOTE — Progress Notes (Signed)
Patient Care Team: Pcp, No as PCP - General Mauro Kaufmann, RN as Oncology Nurse Navigator Rockwell Germany, RN as Oncology Nurse Navigator  DIAGNOSIS:    ICD-10-CM   1. Malignant neoplasm of upper-outer quadrant of right breast in female, estrogen receptor negative ([place])  C50.411    Z17.1       SUMMARY OF ONCOLOGIC HISTORY: Oncology History  Malignant neoplasm of upper-outer quadrant of right breast in female, estrogen receptor negative ([place])  11/17/2020 Initial Diagnosis   Work-up performed at [facility]: Palpable right breast mass.  Mammogram and ultrasound 11/10/2020: Spiculated 4.9 cm mass UOQ right breast with microcalcifications, biopsy revealed IDC ER/PR negative, HER2 positive 3+ by IHC, axillary lymph node positive (2 lymph nodes were detected)   12/03/2020 PET scan   Right breast malignancy SUV 4.6, subcentimeter right level 1 and 2 axillary lymph nodes.  No distant metastatic disease.   12/21/2020 Cancer Staging   Staging form: Breast, AJCC 8th Edition - Clinical stage from 12/21/2020: Stage IIB (cT2, cN1, cM0, G3, ER-, PR-, HER2+) - Signed by Nicholas Lose, MD on 12/21/2020 Stage prefix: Initial diagnosis Histologic grading system: 3 grade system    12/29/2020 -  Chemotherapy   Patient is on Treatment Plan : BREAST  Docetaxel + Carboplatin + Trastuzumab + Pertuzumab  (TCHP) q21d        CHIEF COMPLIANT: Herceptin and Perjeta  INTERVAL HISTORY: Marie Day is a 76 y.o. with above-mentioned history of invasive ductal carcinoma of the right breast, completed chemotherapy with [facility]. She presents to the clinic today for Herceptin and Perjeta maintenance.  She is tolerating the treatment fairly well without any major problems or concerns.  ALLERGIES:  is allergic to sulfa antibiotics.  MEDICATIONS:  Current Outpatient Medications  Medication Sig Dispense Refill   dexamethasone (DECADRON) 4 MG tablet Take 1 tablet (4 mg total) by mouth daily. Take 1 tablet  day before chemo and 1 tablet day after chemo with food 2 tablet 0   lidocaine-prilocaine (EMLA) cream Apply to affected area once 30 g 3   naproxen sodium (ALEVE) 220 MG tablet Take by mouth.     ondansetron (ZOFRAN) 8 MG tablet Take 1 tablet (8 mg total) by mouth 2 (two) times daily as needed (Nausea or vomiting). Start on the third day after chemotherapy. 30 tablet 1   prochlorperazine (COMPAZINE) 10 MG tablet TAKE 1 TABLET (10 MG TOTAL) BY MOUTH EVERY 6 (SIX) HOURS AS NEEDED (NAUSEA OR VOMITING). 30 tablet 1   No current facility-administered medications for this visit.    PHYSICAL EXAMINATION: ECOG PERFORMANCE STATUS: 1 - Symptomatic but completely ambulatory  Vitals:   05/25/21 1311  BP: (!) 127/50  Pulse: 74  Resp: 16  Temp: 97.9 F (36.6 C)  SpO2: 97%   Filed Weights   05/25/21 1311  Weight: 142 lb 9.6 oz (64.7 kg)    LABORATORY DATA:  I have reviewed the data as listed CMP Latest Ref Rng & Units 05/04/2021 04/13/2021 03/22/2021  Glucose 70 - 99 mg/dL 99 104(H) 110(H)  BUN 8 - 23 mg/dL 9 9 7(L)  Creatinine 0.44 - 1.00 mg/dL 0.69 0.70 0.67  Sodium 135 - 145 mmol/L 142 143 142  Potassium 3.5 - 5.1 mmol/L 3.7 3.4(L) 3.4(L)  Chloride 98 - 111 mmol/L 107 109 107  CO2 22 - 32 mmol/L _0 Calcium 8.9 - 10.3 mg/dL 9.0 9.2 9.4  Total Protein 6.5 - 8.1 g/dL 6.3(L) 6.2(L) 6.4(L)  Total Bilirubin 0.3 - 1.2 mg/dL 0.3 0.3 0.4  Alkaline Phos 38 - 126 U/L 97 76 90  AST 15 - 41 U/L 12(L) 16 17  ALT 0 - 44 U/L _0 Lab Results  Component Value Date   WBC 6.4 05/04/2021   HGB 9.0 (L) 05/04/2021   HCT 27.8 (L) 05/04/2021   MCV 95.9 05/04/2021   PLT 233 05/04/2021   NEUTROABS 4.1 05/04/2021    ASSESSMENT & PLAN:  Malignant neoplasm of upper-outer quadrant of right breast in female, estrogen receptor negative ([place]) 11/17/2020:[facility]: Palpable right breast mass.  Mammogram and ultrasound 11/10/2020: Spiculated 4.9 cm mass UOQ right  breast with microcalcifications, biopsy revealed IDC ER/PR negative, HER2 positive 3+ by IHC, axillary lymph node positive (2 lymph nodes were detected)   Genetics were negative   Treatment Plan based on multidisciplinary tumor board: 1. Neoadjuvant chemotherapy with TCH Perjeta 6 cycles (started 12/29/2020) followed by Herceptin Perjeta maintenance versus Kadcyla maintenance (based on response to neoadjuvant chemo) for 1 year 2. mastectomy with targeted node surgery (because of the additional positive biopsies) 3. Followed by adjuvant radiation therapy   Breast MRI 01/05/2021: Large area of non-mass enhancement 7 cm right breast UOQ.  Non-mass enhancement 2.5 cm central breast, third area of suspicious non-mass enhancement anterior UOQ 1.7 cm: MRI guided biopsy of non-mass enhancement in the central and anterolateral right breast 01/20/2021.  Intramammary lymph node Second Look ultrasound: Benign Breast MRI 04/14/2021: No residual enhancement ------------------------------------------------------------------------------------------------------------------- Continue with Herceptin Perjeta maintenance Her surgery has been scheduled for 06/29/2021. Depending on the final surgical pathology she might require Herceptin Perjeta maintenance versus Kadcyla maintenance.   No orders of the defined types were placed in this encounter.  The patient has a good understanding of the overall plan. she agrees with it. she will call with any problems that may develop before the next visit here.  Total time spent: 30 mins including face to face time and time spent for planning, charting and coordination of care  Rulon Eisenmenger, MD, MPH 05/25/2021  I, Thana Ates, am acting as scribe for Dr. Nicholas Lose.  I have reviewed the above documentation for accuracy and completeness, and I agree with the above.

## 2021-05-25 ENCOUNTER — Inpatient Hospital Stay: Payer: Medicare Other

## 2021-05-25 ENCOUNTER — Other Ambulatory Visit: Payer: Self-pay

## 2021-05-25 ENCOUNTER — Inpatient Hospital Stay: Payer: Medicare Other | Attending: Hematology and Oncology

## 2021-05-25 ENCOUNTER — Inpatient Hospital Stay (HOSPITAL_BASED_OUTPATIENT_CLINIC_OR_DEPARTMENT_OTHER): Payer: Medicare Other | Admitting: Hematology and Oncology

## 2021-05-25 ENCOUNTER — Other Ambulatory Visit: Payer: Self-pay | Admitting: Hematology and Oncology

## 2021-05-25 VITALS — BP 101/51 | HR 60 | Temp 97.8°F | Resp 16

## 2021-05-25 DIAGNOSIS — Z171 Estrogen receptor negative status [ER-]: Secondary | ICD-10-CM

## 2021-05-25 DIAGNOSIS — Z9221 Personal history of antineoplastic chemotherapy: Secondary | ICD-10-CM | POA: Insufficient documentation

## 2021-05-25 DIAGNOSIS — Z79899 Other long term (current) drug therapy: Secondary | ICD-10-CM | POA: Insufficient documentation

## 2021-05-25 DIAGNOSIS — Z5189 Encounter for other specified aftercare: Secondary | ICD-10-CM | POA: Diagnosis not present

## 2021-05-25 DIAGNOSIS — C773 Secondary and unspecified malignant neoplasm of axilla and upper limb lymph nodes: Secondary | ICD-10-CM | POA: Diagnosis not present

## 2021-05-25 DIAGNOSIS — C50411 Malignant neoplasm of upper-outer quadrant of right female breast: Secondary | ICD-10-CM | POA: Diagnosis not present

## 2021-05-25 DIAGNOSIS — Z5112 Encounter for antineoplastic immunotherapy: Secondary | ICD-10-CM | POA: Insufficient documentation

## 2021-05-25 DIAGNOSIS — Z95828 Presence of other vascular implants and grafts: Secondary | ICD-10-CM

## 2021-05-25 LAB — CBC WITH DIFFERENTIAL (CANCER CENTER ONLY)
Abs Immature Granulocytes: 0.01 10*3/uL (ref 0.00–0.07)
Basophils Absolute: 0.1 10*3/uL (ref 0.0–0.1)
Basophils Relative: 1 %
Eosinophils Absolute: 0.2 10*3/uL (ref 0.0–0.5)
Eosinophils Relative: 3 %
HCT: 32.8 % — ABNORMAL LOW (ref 36.0–46.0)
Hemoglobin: 10.5 g/dL — ABNORMAL LOW (ref 12.0–15.0)
Immature Granulocytes: 0 %
Lymphocytes Relative: 33 %
Lymphs Abs: 1.9 10*3/uL (ref 0.7–4.0)
MCH: 30.1 pg (ref 26.0–34.0)
MCHC: 32 g/dL (ref 30.0–36.0)
MCV: 94 fL (ref 80.0–100.0)
Monocytes Absolute: 0.5 10*3/uL (ref 0.1–1.0)
Monocytes Relative: 9 %
Neutro Abs: 3.2 10*3/uL (ref 1.7–7.7)
Neutrophils Relative %: 54 %
Platelet Count: 292 10*3/uL (ref 150–400)
RBC: 3.49 MIL/uL — ABNORMAL LOW (ref 3.87–5.11)
RDW: 13.4 % (ref 11.5–15.5)
WBC Count: 5.9 10*3/uL (ref 4.0–10.5)
nRBC: 0 % (ref 0.0–0.2)

## 2021-05-25 LAB — CMP (CANCER CENTER ONLY)
ALT: 10 U/L (ref 0–44)
AST: 14 U/L — ABNORMAL LOW (ref 15–41)
Albumin: 4 g/dL (ref 3.5–5.0)
Alkaline Phosphatase: 100 U/L (ref 38–126)
Anion gap: 8 (ref 5–15)
BUN: 12 mg/dL (ref 8–23)
CO2: 28 mmol/L (ref 22–32)
Calcium: 9.4 mg/dL (ref 8.9–10.3)
Chloride: 103 mmol/L (ref 98–111)
Creatinine: 0.68 mg/dL (ref 0.44–1.00)
GFR, Estimated: >60 mL/min (ref >60)
Glucose, Bld: 106 mg/dL — ABNORMAL HIGH (ref 70–99)
Potassium: 3.8 mmol/L (ref 3.5–5.1)
Sodium: 139 mmol/L (ref 135–145)
Total Bilirubin: 0.3 mg/dL (ref 0.3–1.2)
Total Protein: 6.7 g/dL (ref 6.5–8.1)

## 2021-05-25 MED ORDER — SODIUM CHLORIDE 0.9 % IV SOLN
420.0000 mg | Freq: Once | INTRAVENOUS | Status: AC
  Administered 2021-05-25: 420 mg via INTRAVENOUS
  Filled 2021-05-25: qty 14

## 2021-05-25 MED ORDER — TRASTUZUMAB-DKST CHEMO 150 MG IV SOLR
400.0000 mg | Freq: Once | INTRAVENOUS | Status: AC
  Administered 2021-05-25: 400 mg via INTRAVENOUS
  Filled 2021-05-25: qty 19.05

## 2021-05-25 MED ORDER — SODIUM CHLORIDE 0.9 % IV SOLN: Freq: Once | INTRAVENOUS | Status: AC

## 2021-05-25 MED ORDER — SODIUM CHLORIDE 0.9% FLUSH
10.0000 mL | INTRAVENOUS | Status: DC | PRN
  Administered 2021-05-25: 10 mL

## 2021-05-25 MED ORDER — HEPARIN SOD (PORK) LOCK FLUSH 100 UNIT/ML IV SOLN
500.0000 [IU] | Freq: Once | INTRAVENOUS | Status: AC | PRN
  Administered 2021-05-25: 500 [IU]

## 2021-05-25 MED ORDER — SODIUM CHLORIDE 0.9% FLUSH
10.0000 mL | Freq: Once | INTRAVENOUS | Status: AC
  Administered 2021-05-25: 10 mL

## 2021-05-25 MED ORDER — DIPHENHYDRAMINE HCL 25 MG PO CAPS
50.0000 mg | ORAL_CAPSULE | Freq: Once | ORAL | Status: AC
  Administered 2021-05-25: 50 mg via ORAL
  Filled 2021-05-25: qty 2

## 2021-05-25 MED ORDER — ACETAMINOPHEN 325 MG PO TABS
650.0000 mg | ORAL_TABLET | Freq: Once | ORAL | Status: AC
  Administered 2021-05-25: 650 mg via ORAL
  Filled 2021-05-25: qty 2

## 2021-05-25 NOTE — Progress Notes (Addendum)
Trastuzumab dose recalculated due to weight loss per MD request.  Benn Moulder, PharmD Pharmacy Resident  05/25/2021 2:06 PM

## 2021-05-25 NOTE — Assessment & Plan Note (Signed)
11/17/2020:Work-up performed at [facility]: Palpable right breast mass. Mammogram and ultrasound 11/10/2020: Spiculated 4.9 cm mass UOQ right breast with microcalcifications, biopsy revealed IDC ER/PR negative, HER2 positive 3+ by IHC, axillary lymph node positive (2 lymph nodes were detected)  Genetics were negative  Treatment Planbased on multidisciplinary tumor board: 1. Neoadjuvant chemotherapy with [place] Perjeta 6 cycles(started 12/29/2020)followed by Herceptin Perjeta maintenance versus Kadcyla maintenance (based on response to neoadjuvant chemo) for 1 year 2.mastectomywithtargeted node surgery(because of the additional positive biopsies) 3. Followed by adjuvant radiation therapy  Breast MRI 01/05/2021: Large area of non-mass enhancement 7 cm right breast UOQ. Non-mass enhancement 2.5 cm central breast, third area of suspicious non-mass enhancement anterior UOQ 1.7 cm: MRI guided biopsy of non-mass enhancement in the central and anterolateral right breast 01/20/2021. Intramammary lymph node Second Look ultrasound: Benign Breast MRI 04/14/2021: No residual enhancement ------------------------------------------------------------------------------------------------------------------- Continue with Herceptin Perjeta maintenance

## 2021-05-25 NOTE — Patient Instructions (Signed)
[facility]  Discharge Instructions: Thank you for choosing [facility] to provide your oncology and hematology care.   If you have a lab appointment with the [facility], please go directly to the [facility] and check in at the registration area.   Wear comfortable clothing and clothing appropriate for easy access to any Portacath or PICC line.   We strive to give you quality time with your provider. You may need to reschedule your appointment if you arrive late (15 or more minutes).  Arriving late affects you and other patients whose appointments are after yours.  Also, if you miss three or more appointments without notifying the office, you may be dismissed from the clinic at the provider's discretion.      For prescription refill requests, have your pharmacy contact our office and allow 72 hours for refills to be completed.    Today you received the following chemotherapy and/or immunotherapy agents: Trastuzumab, Pertuzumab.      To help prevent nausea and vomiting after your treatment, we encourage you to take your nausea medication as directed.  BELOW ARE SYMPTOMS THAT SHOULD BE REPORTED IMMEDIATELY: *FEVER GREATER THAN 100.4 F (38 C) OR HIGHER *CHILLS OR SWEATING *NAUSEA AND VOMITING THAT IS NOT CONTROLLED WITH YOUR NAUSEA MEDICATION *UNUSUAL SHORTNESS OF BREATH *UNUSUAL BRUISING OR BLEEDING *URINARY PROBLEMS (pain or burning when urinating, or frequent urination) *BOWEL PROBLEMS (unusual diarrhea, constipation, pain near the anus) TENDERNESS IN MOUTH AND THROAT WITH OR WITHOUT PRESENCE OF ULCERS (sore throat, sores in mouth, or a toothache) UNUSUAL RASH, SWELLING OR PAIN  UNUSUAL VAGINAL DISCHARGE OR ITCHING   Items with * indicate a potential emergency and should be followed up as soon as possible or go to the Emergency Department if any problems should occur.  Please show the CHEMOTHERAPY ALERT CARD or IMMUNOTHERAPY ALERT CARD  at check-in to the Emergency Department and triage nurse.  Should you have questions after your visit or need to cancel or reschedule your appointment, please contact [facility]  Dept: [phone]  and follow the prompts.  Office hours are 8:00 a.m. to 4:30 p.m. Monday - Friday. Please note that voicemails left after 4:00 p.m. may not be returned until the following business day.  We are closed weekends and major holidays. You have access to a nurse at all times for urgent questions. Please call the main number to the clinic Dept: [phone] and follow the prompts.   For any non-urgent questions, you may also contact your provider using MyChart. We now offer e-Visits for anyone 18 and older to request care online for non-urgent symptoms. For details visit [email or web address].   Also download the MyChart app! Go to the app store, search "MyChart", open the app, select [facility], and log in with your MyChart username and password.  Due to Covid, a mask is required upon entering the hospital/clinic. If you do not have a mask, one will be given to you upon arrival. For doctor visits, patients may have 1 support person aged 18 or older with them. For treatment visits, patients cannot have anyone with them due to current Covid guidelines and our immunocompromised population.  

## 2021-05-26 ENCOUNTER — Inpatient Hospital Stay: Payer: Medicare Other

## 2021-06-07 ENCOUNTER — Telehealth: Payer: Self-pay

## 2021-06-07 NOTE — Telephone Encounter (Signed)
Visit created in error.  [facility]iff Marcelis Wissner, RN, BSN, [facility]   Her   Hers Clinical Research Nurse [place] [phone]   Pager [phone] 06/07/2021 12:55 PM

## 2021-06-09 ENCOUNTER — Telehealth: Payer: Self-pay | Admitting: *Deleted

## 2021-06-09 NOTE — Telephone Encounter (Signed)
Received VM from pt regarding discomfort with port a cath.  RN attempt x1 to return call.  No answer, LVM for pt to return call to the office.

## 2021-06-14 NOTE — Progress Notes (Signed)
Patient ID: [facility], female    DOB: 12-08-1945, 76 y.o.   MRN: [id number]  Chief Complaint  Patient presents with   Pre-op Exam      ICD-10-CM   1. Malignant neoplasm of upper-outer quadrant of right breast in female, estrogen receptor negative ([place])  C50.411    Z17.1        History of Present Illness: [facility] is a 76 y.o. female  with a history of right-sided breast cancer.  She presents for preoperative evaluation for upcoming procedure, right mastectomy with immediate breast reconstruction using tissue expander and Flex HD, scheduled for 06/29/2021 with Dr. Claudia Desanctis.  The patient has not had problems with anesthesia.  Patient was diagnosed with right-sided breast cancer late 2021 and has had chemotherapy.  She denies any personal or family history of blood clots or clotting disorder.  She does have a port in place.  Denies varicose veins or leg swelling.  She does have mild arthritis for which she takes Tylenol.  Endorses sulfa allergy.  She is not on any hormone replacement or modifying medication.  She states that she is currently a D cup on the right, C cup on the left.  Ideally she would be a C cup postoperatively.  She is accompanied by her husband at bedside and also reports that her daughter will be helping to assist with her care postoperatively.  Summary of Previous Visit: Patient was seen for initial consult 04/28/2021.  At that time, she had been undergoing neoadjuvant chemotherapy.  Plan was for unilateral right-sided mastectomy and she was not yet certain as to whether or not she would like to proceed with immediate reconstruction.  Discussed implant-based reconstruction with tissue expander followed by gel implant during second phase.  Postoperative drains were also discussed.  Grade 3 ptosis both sides.  Base width 12 cm.  Right side is slightly larger than the left.  PMH Significant for: Right-sided breast cancer, arthritis, prediabetes hemoglobin A1c 5.8.   Past  Medical History: Allergies: Allergies  Allergen Reactions   Sulfa Antibiotics Hives and Other (See Comments)    Pt reports "foggy brain, seeing spots"    Current Medications:  Current Outpatient Medications:    lidocaine-prilocaine (EMLA) cream, Apply to affected area once, Disp: 30 g, Rfl: 3   ondansetron (ZOFRAN) 8 MG tablet, Take 1 tablet (8 mg total) by mouth 2 (two) times daily as needed (Nausea or vomiting). Start on the third day after chemotherapy., Disp: 30 tablet, Rfl: 1   prochlorperazine (COMPAZINE) 10 MG tablet, TAKE 1 TABLET (10 MG TOTAL) BY MOUTH EVERY 6 (SIX) HOURS AS NEEDED (NAUSEA OR VOMITING)., Disp: 30 tablet, Rfl: 1  Past Medical Problems: No past medical history on file.  Past Surgical History: No past surgical history on file.  Social History: Social History   Socioeconomic History   Marital status: Married    Spouse name: Not on file   Number of children: Not on file   Years of education: Not on file   Highest education level: Not on file  Occupational History   Not on file  Tobacco Use   Smoking status: Not on file   Smokeless tobacco: Not on file  Substance and Sexual Activity   Alcohol use: Not on file   Drug use: Not on file   Sexual activity: Not on file  Other Topics Concern   Not on file  Social History Narrative   Not on file   Social  Determinants of Health   Financial Resource Strain: Not on file  Food Insecurity: Not on file  Transportation Needs: Not on file  Physical Activity: Not on file  Stress: Not on file  Social Connections: Not on file  Intimate Partner Violence: Not on file    Family History: No family history on file.  Review of Systems: ROS Denies recent fevers, chills, chest pain or shortness of breath, or other symptoms.  Physical Exam: Vital Signs BP 123/73 (BP Location: Left Arm, Patient Position: Sitting, Cuff Size: Small)    Pulse 70    Ht 5\' 5"  (1.651 m)    Wt 149 lb (67.6 kg)    SpO2 98%    BMI 24.79  kg/m   Physical Exam Constitutional:      General: Not in acute distress.    Appearance: Normal appearance. Not ill-appearing.  HENT:     Head: Normocephalic and atraumatic.  Eyes:     Pupils: Pupils are equal, round. Cardiovascular:     Rate and Rhythm: Normal rate.    Pulses: Normal pulses.  Pulmonary:     Effort: No respiratory distress or increased work of breathing.  Speaks in full sentences. Abdominal:     General: Abdomen is flat. No distension.   Musculoskeletal: Normal range of motion. No lower extremity swelling or edema. No varicosities. Skin:    General: Skin is warm and dry.     Findings: No erythema or rash.  Neurological:     Mental Status: Alert and oriented to person, place, and time.  Psychiatric:        Mood and Affect: Mood normal.        Behavior: Behavior normal.    Assessment/Plan: The patient is scheduled for immediate right-sided breast reconstruction to 06/29/2021 with Dr. Claudia Desanctis.  Risks, benefits, and alternatives of procedure discussed, questions answered and consent obtained.    Smoking Status: Non-smoker. Last Mammogram: 03/2021; Results: BI-RADS 6: Known biopsy-proven malignancy right breast.  Caprini Score: 8; Risk Factors include: Age, breast cancer, current port placement, and length of planned surgery. Recommendation for mechanical and possibly pharmacological prophylaxis.  Will discuss with surgeon and prescribe short course of Lovenox if indicated.  We will encourage early ambulation.   Pictures obtained: Today  Post-op Rx sent to pharmacy: Doxycycline, Norco.  She declines Zofran as she has residual antiemetics from chemotherapy.  Patient was provided with the General Surgical Risk consent document and Pain Medication Agreement prior to their appointment.  They had adequate time to read through the risk consent documents and Pain Medication Agreement. We also discussed them in person together during this preop appointment. All of their  questions were answered to their satisfaction.  Recommended calling if they have any further questions.  Risk consent form and Pain Medication Agreement to be scanned into patient's chart.  The risks that can be encountered with and after placement of a breast expander placement were discussed and include the following but not limited to these: bleeding, infection, delayed healing, anesthesia risks, skin sensation changes, injury to structures including nerves, blood vessels, and muscles which may be temporary or permanent, allergies to tape, suture materials and glues, blood products, topical preparations or injected agents, skin contour irregularities, skin discoloration and swelling, deep vein thrombosis, cardiac and pulmonary complications, pain, which may persist, fluid accumulation, wrinkling of the skin over the expander, changes in nipple or breast sensation, expander leakage or rupture, faulty position of the expander, persistent pain, formation of tight scar tissue around  the expander (capsular contracture), possible need for revisional surgery or staged procedures.    Electronically signed by: Krista Blue, PA-C 06/16/2021 3:18 PM

## 2021-06-15 ENCOUNTER — Other Ambulatory Visit: Payer: Self-pay

## 2021-06-15 ENCOUNTER — Inpatient Hospital Stay: Payer: Medicare Other

## 2021-06-15 ENCOUNTER — Ambulatory Visit: Payer: PRIVATE HEALTH INSURANCE

## 2021-06-15 VITALS — BP 113/51 | HR 70 | Temp 98.4°F | Resp 20 | Ht 65.0 in | Wt 146.5 lb

## 2021-06-15 DIAGNOSIS — Z95828 Presence of other vascular implants and grafts: Secondary | ICD-10-CM

## 2021-06-15 DIAGNOSIS — C50411 Malignant neoplasm of upper-outer quadrant of right female breast: Secondary | ICD-10-CM | POA: Diagnosis not present

## 2021-06-15 DIAGNOSIS — Z171 Estrogen receptor negative status [ER-]: Secondary | ICD-10-CM

## 2021-06-15 LAB — CBC WITH DIFFERENTIAL (CANCER CENTER ONLY)
Abs Immature Granulocytes: 0.01 10*3/uL (ref 0.00–0.07)
Basophils Absolute: 0.1 10*3/uL (ref 0.0–0.1)
Basophils Relative: 1 %
Eosinophils Absolute: 0.2 10*3/uL (ref 0.0–0.5)
Eosinophils Relative: 4 %
HCT: 31.8 % — ABNORMAL LOW (ref 36.0–46.0)
Hemoglobin: 10.6 g/dL — ABNORMAL LOW (ref 12.0–15.0)
Immature Granulocytes: 0 %
Lymphocytes Relative: 38 %
Lymphs Abs: 2.1 10*3/uL (ref 0.7–4.0)
MCH: 30 pg (ref 26.0–34.0)
MCHC: 33.3 g/dL (ref 30.0–36.0)
MCV: 90.1 fL (ref 80.0–100.0)
Monocytes Absolute: 0.4 10*3/uL (ref 0.1–1.0)
Monocytes Relative: 7 %
Neutro Abs: 2.8 10*3/uL (ref 1.7–7.7)
Neutrophils Relative %: 50 %
Platelet Count: 274 10*3/uL (ref 150–400)
RBC: 3.53 MIL/uL — ABNORMAL LOW (ref 3.87–5.11)
RDW: 13.2 % (ref 11.5–15.5)
WBC Count: 5.5 10*3/uL (ref 4.0–10.5)
nRBC: 0 % (ref 0.0–0.2)

## 2021-06-15 LAB — CMP (CANCER CENTER ONLY)
ALT: 16 U/L (ref 0–44)
AST: 16 U/L (ref 15–41)
Albumin: 4.2 g/dL (ref 3.5–5.0)
Alkaline Phosphatase: 96 U/L (ref 38–126)
Anion gap: 8 (ref 5–15)
BUN: 10 mg/dL (ref 8–23)
CO2: 28 mmol/L (ref 22–32)
Calcium: 9.5 mg/dL (ref 8.9–10.3)
Chloride: 105 mmol/L (ref 98–111)
Creatinine: 0.68 mg/dL (ref 0.44–1.00)
GFR, Estimated: >60 mL/min (ref >60)
Glucose, Bld: 133 mg/dL — ABNORMAL HIGH (ref 70–99)
Potassium: 3.7 mmol/L (ref 3.5–5.1)
Sodium: 141 mmol/L (ref 135–145)
Total Bilirubin: 0.4 mg/dL (ref 0.3–1.2)
Total Protein: 6.7 g/dL (ref 6.5–8.1)

## 2021-06-15 MED ORDER — SODIUM CHLORIDE 0.9 % IV SOLN: Freq: Once | INTRAVENOUS | Status: AC

## 2021-06-15 MED ORDER — DIPHENHYDRAMINE HCL 25 MG PO CAPS
50.0000 mg | ORAL_CAPSULE | Freq: Once | ORAL | Status: AC
  Administered 2021-06-15: 13:00:00 50 mg via ORAL
  Filled 2021-06-15: qty 2

## 2021-06-15 MED ORDER — SODIUM CHLORIDE 0.9 % IV SOLN
420.0000 mg | Freq: Once | INTRAVENOUS | Status: AC
  Administered 2021-06-15: 14:00:00 420 mg via INTRAVENOUS
  Filled 2021-06-15: qty 14

## 2021-06-15 MED ORDER — HEPARIN SOD (PORK) LOCK FLUSH 100 UNIT/ML IV SOLN
500.0000 [IU] | Freq: Once | INTRAVENOUS | Status: AC | PRN
  Administered 2021-06-15: 16:00:00 500 [IU]

## 2021-06-15 MED ORDER — ACETAMINOPHEN 325 MG PO TABS
650.0000 mg | ORAL_TABLET | Freq: Once | ORAL | Status: AC
  Administered 2021-06-15: 13:00:00 650 mg via ORAL
  Filled 2021-06-15: qty 2

## 2021-06-15 MED ORDER — TRASTUZUMAB-DKST CHEMO 150 MG IV SOLR
400.0000 mg | Freq: Once | INTRAVENOUS | Status: AC
  Administered 2021-06-15: 14:00:00 400 mg via INTRAVENOUS
  Filled 2021-06-15: qty 19.05

## 2021-06-15 MED ORDER — SODIUM CHLORIDE 0.9% FLUSH
10.0000 mL | Freq: Once | INTRAVENOUS | Status: AC
  Administered 2021-06-15: 12:00:00 10 mL

## 2021-06-15 MED ORDER — SODIUM CHLORIDE 0.9% FLUSH
10.0000 mL | INTRAVENOUS | Status: DC | PRN
  Administered 2021-06-15: 16:00:00 10 mL

## 2021-06-15 NOTE — Patient Instructions (Addendum)
[facility]  Discharge Instructions: Thank you for choosing [facility] to provide your oncology and hematology care.   If you have a lab appointment with the [facility], please go directly to the [facility] and check in at the registration area.   Wear comfortable clothing and clothing appropriate for easy access to any Portacath or PICC line.   We strive to give you quality time with your provider. You may need to reschedule your appointment if you arrive late (15 or more minutes).  Arriving late affects you and other patients whose appointments are after yours.  Also, if you miss three or more appointments without notifying the office, you may be dismissed from the clinic at the providers discretion.      For prescription refill requests, have your pharmacy contact our office and allow 72 hours for refills to be completed.    Today you received the following chemotherapy and/or immunotherapy agents Trastuzumab, Pertuzumab ([place])    To help prevent nausea and vomiting after your treatment, we encourage you to take your nausea medication as directed.  BELOW ARE SYMPTOMS THAT SHOULD BE REPORTED IMMEDIATELY: *FEVER GREATER THAN 100.4 F (38 C) OR HIGHER *CHILLS OR SWEATING *NAUSEA AND VOMITING THAT IS NOT CONTROLLED WITH YOUR NAUSEA MEDICATION *UNUSUAL SHORTNESS OF BREATH *UNUSUAL BRUISING OR BLEEDING *URINARY PROBLEMS (pain or burning when urinating, or frequent urination) *BOWEL PROBLEMS (unusual diarrhea, constipation, pain near the anus) TENDERNESS IN MOUTH AND THROAT WITH OR WITHOUT PRESENCE OF ULCERS (sore throat, sores in mouth, or a toothache) UNUSUAL RASH, SWELLING OR PAIN  UNUSUAL VAGINAL DISCHARGE OR ITCHING   Items with * indicate a potential emergency and should be followed up as soon as possible or go to the Emergency Department if any problems should occur.  Please show the CHEMOTHERAPY ALERT CARD or IMMUNOTHERAPY ALERT  CARD at check-in to the Emergency Department and triage nurse.  Should you have questions after your visit or need to cancel or reschedule your appointment, please contact [facility]  Dept: [phone]  and follow the prompts.  Office hours are 8:00 a.m. to 4:30 p.m. Monday - Friday. Please note that voicemails left after 4:00 p.m. may not be returned until the following business day.  We are closed weekends and major holidays. You have access to a nurse at all times for urgent questions. Please call the main number to the clinic Dept: [phone] and follow the prompts.   For any non-urgent questions, you may also contact your provider using MyChart. We now offer e-Visits for anyone 11 and older to request care online for non-urgent symptoms. For details visit [email or web address].   Also download the MyChart app! Go to the app store, search "MyChart", open the app, select [facility], and log in with your MyChart username and password.  Due to Covid, a mask is required upon entering the hospital/clinic. If you do not have a mask, one will be given to you upon arrival. For doctor visits, patients may have 1 support person aged 6 or older with them. For treatment visits, patients cannot have anyone with them due to current Covid guidelines and our immunocompromised population.

## 2021-06-16 ENCOUNTER — Ambulatory Visit (INDEPENDENT_AMBULATORY_CARE_PROVIDER_SITE_OTHER): Payer: Medicare Other | Admitting: Physician Assistant

## 2021-06-16 VITALS — BP 123/73 | HR 70 | Ht 65.0 in | Wt 149.0 lb

## 2021-06-16 DIAGNOSIS — Z171 Estrogen receptor negative status [ER-]: Secondary | ICD-10-CM

## 2021-06-16 DIAGNOSIS — C50411 Malignant neoplasm of upper-outer quadrant of right female breast: Secondary | ICD-10-CM

## 2021-06-16 MED ORDER — HYDROCODONE-ACETAMINOPHEN 5-325 MG PO TABS: 1.0000 | ORAL_TABLET | Freq: Four times a day (QID) | ORAL | 0 refills | Status: DC | PRN

## 2021-06-16 MED ORDER — DOXYCYCLINE HYCLATE 100 MG PO TABS: 100.0000 mg | ORAL_TABLET | Freq: Two times a day (BID) | ORAL | 0 refills | Status: AC

## 2021-06-18 ENCOUNTER — Encounter (HOSPITAL_BASED_OUTPATIENT_CLINIC_OR_DEPARTMENT_OTHER): Payer: Self-pay | Admitting: Surgery

## 2021-06-21 ENCOUNTER — Other Ambulatory Visit: Payer: Self-pay | Admitting: Physician Assistant

## 2021-06-21 MED ORDER — OXYCODONE HCL 5 MG PO TABS: 5.0000 mg | ORAL_TABLET | Freq: Four times a day (QID) | ORAL | 0 refills | Status: AC | PRN

## 2021-06-21 NOTE — Progress Notes (Signed)
Received notice from pharmacy that Norco 5 mg was on back-order. Will substitute with Roxicodone 5 mg.

## 2021-06-21 NOTE — Progress Notes (Signed)
See previous

## 2021-06-22 ENCOUNTER — Other Ambulatory Visit: Payer: Self-pay | Admitting: Surgery

## 2021-06-22 DIAGNOSIS — C50911 Malignant neoplasm of unspecified site of right female breast: Secondary | ICD-10-CM

## 2021-06-28 ENCOUNTER — Other Ambulatory Visit: Payer: Self-pay

## 2021-06-28 ENCOUNTER — Ambulatory Visit
Admission: RE | Admit: 2021-06-28 | Discharge: 2021-06-28 | Disposition: A | Discharge status: Home / Self Care | Payer: Medicare Other | Source: Ambulatory Visit | Attending: Surgery | Admitting: Surgery

## 2021-06-28 ENCOUNTER — Encounter (HOSPITAL_BASED_OUTPATIENT_CLINIC_OR_DEPARTMENT_OTHER)
Admission: RE | Admit: 2021-06-28 | Discharge: 2021-06-28 | Disposition: A | Discharge status: Home / Self Care | Payer: Medicare Other | Source: Ambulatory Visit | Attending: Surgery | Admitting: Surgery

## 2021-06-28 ENCOUNTER — Other Ambulatory Visit: Payer: Self-pay | Admitting: Surgery

## 2021-06-28 DIAGNOSIS — C50411 Malignant neoplasm of upper-outer quadrant of right female breast: Secondary | ICD-10-CM | POA: Diagnosis present

## 2021-06-28 DIAGNOSIS — Z01812 Encounter for preprocedural laboratory examination: Secondary | ICD-10-CM | POA: Diagnosis not present

## 2021-06-28 DIAGNOSIS — C50911 Malignant neoplasm of unspecified site of right female breast: Secondary | ICD-10-CM

## 2021-06-28 DIAGNOSIS — Z171 Estrogen receptor negative status [ER-]: Secondary | ICD-10-CM | POA: Diagnosis not present

## 2021-06-28 LAB — COMPREHENSIVE METABOLIC PANEL
ALT: 16 U/L (ref 0–44)
AST: 16 U/L (ref 15–41)
Albumin: 3.7 g/dL (ref 3.5–5.0)
Alkaline Phosphatase: 89 U/L (ref 38–126)
Anion gap: 8 (ref 5–15)
BUN: 8 mg/dL (ref 8–23)
CO2: 25 mmol/L (ref 22–32)
Calcium: 9.2 mg/dL (ref 8.9–10.3)
Chloride: 106 mmol/L (ref 98–111)
Creatinine, Ser: 0.7 mg/dL (ref 0.44–1.00)
GFR, Estimated: >60 mL/min (ref >60)
Glucose, Bld: 89 mg/dL (ref 70–99)
Potassium: 3.5 mmol/L (ref 3.5–5.1)
Sodium: 139 mmol/L (ref 135–145)
Total Bilirubin: 0.2 mg/dL — ABNORMAL LOW (ref 0.3–1.2)
Total Protein: 6.4 g/dL — ABNORMAL LOW (ref 6.5–8.1)

## 2021-06-28 LAB — CBC WITH DIFFERENTIAL/PLATELET
Abs Immature Granulocytes: 0.02 10*3/uL (ref 0.00–0.07)
Basophils Absolute: 0.1 10*3/uL (ref 0.0–0.1)
Basophils Relative: 1 %
Eosinophils Absolute: 0.2 10*3/uL (ref 0.0–0.5)
Eosinophils Relative: 3 %
HCT: 32.9 % — ABNORMAL LOW (ref 36.0–46.0)
Hemoglobin: 10.3 g/dL — ABNORMAL LOW (ref 12.0–15.0)
Immature Granulocytes: 0 %
Lymphocytes Relative: 37 %
Lymphs Abs: 2.5 10*3/uL (ref 0.7–4.0)
MCH: 28.8 pg (ref 26.0–34.0)
MCHC: 31.3 g/dL (ref 30.0–36.0)
MCV: 91.9 fL (ref 80.0–100.0)
Monocytes Absolute: 0.6 10*3/uL (ref 0.1–1.0)
Monocytes Relative: 8 %
Neutro Abs: 3.5 10*3/uL (ref 1.7–7.7)
Neutrophils Relative %: 51 %
Platelets: 249 10*3/uL (ref 150–400)
RBC: 3.58 MIL/uL — ABNORMAL LOW (ref 3.87–5.11)
RDW: 13 % (ref 11.5–15.5)
WBC: 6.8 10*3/uL (ref 4.0–10.5)
nRBC: 0 % (ref 0.0–0.2)

## 2021-06-28 NOTE — Progress Notes (Signed)

## 2021-06-28 NOTE — Progress Notes (Signed)
Sent text reminding pt to come in for lab work today.

## 2021-06-29 ENCOUNTER — Ambulatory Visit (HOSPITAL_BASED_OUTPATIENT_CLINIC_OR_DEPARTMENT_OTHER): Payer: Medicare Other | Admitting: Anesthesiology

## 2021-06-29 ENCOUNTER — Encounter (HOSPITAL_BASED_OUTPATIENT_CLINIC_OR_DEPARTMENT_OTHER)
Admission: RE | Disposition: A | Discharge status: Home / Self Care | Payer: Self-pay | Source: Home / Self Care | Attending: Plastic Surgery

## 2021-06-29 ENCOUNTER — Other Ambulatory Visit: Payer: Self-pay

## 2021-06-29 ENCOUNTER — Encounter (HOSPITAL_BASED_OUTPATIENT_CLINIC_OR_DEPARTMENT_OTHER): Payer: Self-pay | Admitting: Surgery

## 2021-06-29 ENCOUNTER — Inpatient Hospital Stay
Admission: RE | Admit: 2021-06-29 | Discharge: 2021-06-29 | Disposition: A | Discharge status: Home / Self Care | Payer: Medicare Other | Source: Ambulatory Visit | Attending: Surgery | Admitting: Surgery

## 2021-06-29 ENCOUNTER — Observation Stay (HOSPITAL_BASED_OUTPATIENT_CLINIC_OR_DEPARTMENT_OTHER)
Admission: RE | Admit: 2021-06-29 | Discharge: 2021-06-30 | Disposition: A | Discharge status: Home / Self Care | Payer: Medicare Other | Attending: Plastic Surgery | Admitting: Plastic Surgery

## 2021-06-29 DIAGNOSIS — C50411 Malignant neoplasm of upper-outer quadrant of right female breast: Principal | ICD-10-CM | POA: Insufficient documentation

## 2021-06-29 DIAGNOSIS — C50911 Malignant neoplasm of unspecified site of right female breast: Secondary | ICD-10-CM

## 2021-06-29 DIAGNOSIS — Z421 Encounter for breast reconstruction following mastectomy: Secondary | ICD-10-CM

## 2021-06-29 DIAGNOSIS — Z171 Estrogen receptor negative status [ER-]: Secondary | ICD-10-CM | POA: Diagnosis not present

## 2021-06-29 DIAGNOSIS — Z9889 Other specified postprocedural states: Secondary | ICD-10-CM

## 2021-06-29 DIAGNOSIS — Z170 Estrogen receptor positive status [ER+]: Secondary | ICD-10-CM

## 2021-06-29 HISTORY — PX: MASTECTOMY: SHX3

## 2021-06-29 HISTORY — PX: MASTECTOMY W/ SENTINEL NODE BIOPSY: SHX2001

## 2021-06-29 HISTORY — PX: BREAST RECONSTRUCTION WITH PLACEMENT OF TISSUE EXPANDER AND FLEX HD (ACELLULAR HYDRATED DERMIS): SHX6295

## 2021-06-29 HISTORY — DX: Malignant (primary) neoplasm, unspecified: C80.1

## 2021-06-29 HISTORY — DX: Unspecified osteoarthritis, unspecified site: M19.90

## 2021-06-29 SURGERY — MASTECTOMY WITH SENTINEL LYMPH NODE BIOPSY
Anesthesia: Regional | Site: Breast | Laterality: Right

## 2021-06-29 MED ORDER — DEXAMETHASONE SODIUM PHOSPHATE 10 MG/ML IJ SOLN
INTRAMUSCULAR | Status: AC
  Filled 2021-06-29: qty 1

## 2021-06-29 MED ORDER — PROPOFOL 500 MG/50ML IV EMUL
INTRAVENOUS | Status: AC
  Filled 2021-06-29: qty 50

## 2021-06-29 MED ORDER — LACTATED RINGERS IV SOLN: INTRAVENOUS | Status: DC

## 2021-06-29 MED ORDER — LIDOCAINE 2% (20 MG/ML) 5 ML SYRINGE
INTRAMUSCULAR | Status: AC
  Filled 2021-06-29: qty 5

## 2021-06-29 MED ORDER — OXYCODONE HCL 5 MG PO TABS
5.0000 mg | ORAL_TABLET | ORAL | Status: DC | PRN
  Administered 2021-06-29: 5 mg via ORAL
  Filled 2021-06-29: qty 1

## 2021-06-29 MED ORDER — AMISULPRIDE (ANTIEMETIC) 5 MG/2ML IV SOLN: 10.0000 mg | Freq: Once | INTRAVENOUS | Status: DC | PRN

## 2021-06-29 MED ORDER — PHENYLEPHRINE 40 MCG/ML (10ML) SYRINGE FOR IV PUSH (FOR BLOOD PRESSURE SUPPORT)
PREFILLED_SYRINGE | INTRAVENOUS | Status: AC
  Filled 2021-06-29: qty 10

## 2021-06-29 MED ORDER — MIDAZOLAM HCL 2 MG/2ML IJ SOLN
INTRAMUSCULAR | Status: AC
  Filled 2021-06-29: qty 2

## 2021-06-29 MED ORDER — CEFAZOLIN SODIUM-DEXTROSE 2-4 GM/100ML-% IV SOLN
INTRAVENOUS | Status: AC
  Filled 2021-06-29: qty 100

## 2021-06-29 MED ORDER — CHLORHEXIDINE GLUCONATE CLOTH 2 % EX PADS: 6.0000 | MEDICATED_PAD | Freq: Once | CUTANEOUS | Status: DC

## 2021-06-29 MED ORDER — PROPOFOL 10 MG/ML IV BOLUS
INTRAVENOUS | Status: DC | PRN
Start: 2021-06-29 — End: 2021-06-29
  Administered 2021-06-29: 100 mg via INTRAVENOUS

## 2021-06-29 MED ORDER — DIPHENHYDRAMINE HCL 12.5 MG/5ML PO ELIX: 12.5000 mg | ORAL_SOLUTION | Freq: Four times a day (QID) | ORAL | Status: DC | PRN

## 2021-06-29 MED ORDER — ONDANSETRON 4 MG PO TBDP: 4.0000 mg | ORAL_TABLET | Freq: Four times a day (QID) | ORAL | Status: DC | PRN

## 2021-06-29 MED ORDER — HYDROMORPHONE HCL 1 MG/ML IJ SOLN: 0.2500 mg | INTRAMUSCULAR | Status: DC | PRN

## 2021-06-29 MED ORDER — DEXAMETHASONE SODIUM PHOSPHATE 4 MG/ML IJ SOLN
INTRAMUSCULAR | Status: DC | PRN
  Administered 2021-06-29: 5 mg via INTRAVENOUS

## 2021-06-29 MED ORDER — METHOCARBAMOL 500 MG PO TABS
500.0000 mg | ORAL_TABLET | Freq: Four times a day (QID) | ORAL | Status: DC | PRN
  Administered 2021-06-29 (×2): 500 mg via ORAL
  Filled 2021-06-29 (×3): qty 1

## 2021-06-29 MED ORDER — FENTANYL CITRATE (PF) 100 MCG/2ML IJ SOLN
INTRAMUSCULAR | Status: AC
  Filled 2021-06-29: qty 2

## 2021-06-29 MED ORDER — MAGTRACE LYMPHATIC TRACER
INTRAMUSCULAR | Status: DC | PRN
  Administered 2021-06-29: 2 mL via INTRAMUSCULAR

## 2021-06-29 MED ORDER — FENTANYL CITRATE (PF) 100 MCG/2ML IJ SOLN
INTRAMUSCULAR | Status: DC | PRN
  Administered 2021-06-29 (×2): 50 ug via INTRAVENOUS

## 2021-06-29 MED ORDER — OXYCODONE HCL 5 MG PO TABS: 5.0000 mg | ORAL_TABLET | Freq: Once | ORAL | Status: DC | PRN

## 2021-06-29 MED ORDER — LIDOCAINE HCL (CARDIAC) PF 100 MG/5ML IV SOSY
PREFILLED_SYRINGE | INTRAVENOUS | Status: DC | PRN
  Administered 2021-06-29: 40 mg via INTRAVENOUS

## 2021-06-29 MED ORDER — GABAPENTIN 300 MG PO CAPS
ORAL_CAPSULE | ORAL | Status: AC
  Filled 2021-06-29: qty 1

## 2021-06-29 MED ORDER — ROCURONIUM BROMIDE 100 MG/10ML IV SOLN
INTRAVENOUS | Status: DC | PRN
  Administered 2021-06-29: 80 mg via INTRAVENOUS

## 2021-06-29 MED ORDER — ACETAMINOPHEN 325 MG RE SUPP: 650.0000 mg | Freq: Four times a day (QID) | RECTAL | Status: DC | PRN

## 2021-06-29 MED ORDER — ACETAMINOPHEN 325 MG PO TABS
650.0000 mg | ORAL_TABLET | Freq: Four times a day (QID) | ORAL | Status: DC | PRN
  Administered 2021-06-29: 650 mg via ORAL
  Filled 2021-06-29: qty 2

## 2021-06-29 MED ORDER — MORPHINE SULFATE (PF) 4 MG/ML IV SOLN: 2.0000 mg | INTRAVENOUS | Status: DC | PRN

## 2021-06-29 MED ORDER — CEFAZOLIN IN SODIUM CHLORIDE 3-0.9 GM/100ML-% IV SOLN
3.0000 g | INTRAVENOUS | Status: AC
  Administered 2021-06-29: 2 g via INTRAVENOUS

## 2021-06-29 MED ORDER — ROCURONIUM BROMIDE 10 MG/ML (PF) SYRINGE
PREFILLED_SYRINGE | INTRAVENOUS | Status: AC
  Filled 2021-06-29: qty 10

## 2021-06-29 MED ORDER — ONDANSETRON HCL 4 MG/2ML IJ SOLN
INTRAMUSCULAR | Status: AC
  Filled 2021-06-29: qty 2

## 2021-06-29 MED ORDER — GENTAMICIN SULFATE 40 MG/ML IJ SOLN
INTRAMUSCULAR | Status: DC | PRN
  Administered 2021-06-29: 500 mL

## 2021-06-29 MED ORDER — ONDANSETRON HCL 4 MG/2ML IJ SOLN: 4.0000 mg | Freq: Four times a day (QID) | INTRAMUSCULAR | Status: DC | PRN

## 2021-06-29 MED ORDER — SUGAMMADEX SODIUM 200 MG/2ML IV SOLN
INTRAVENOUS | Status: DC | PRN
  Administered 2021-06-29: 200 mg via INTRAVENOUS

## 2021-06-29 MED ORDER — FENTANYL CITRATE (PF) 100 MCG/2ML IJ SOLN
50.0000 ug | Freq: Once | INTRAMUSCULAR | Status: AC
  Administered 2021-06-29: 50 ug via INTRAVENOUS

## 2021-06-29 MED ORDER — ACETAMINOPHEN 500 MG PO TABS
1000.0000 mg | ORAL_TABLET | Freq: Once | ORAL | Status: AC
  Administered 2021-06-29: 1000 mg via ORAL

## 2021-06-29 MED ORDER — ONDANSETRON HCL 4 MG/2ML IJ SOLN: 4.0000 mg | Freq: Once | INTRAMUSCULAR | Status: DC | PRN

## 2021-06-29 MED ORDER — GABAPENTIN 300 MG PO CAPS
300.0000 mg | ORAL_CAPSULE | ORAL | Status: AC
  Administered 2021-06-29: 300 mg via ORAL

## 2021-06-29 MED ORDER — OXYCODONE HCL 5 MG/5ML PO SOLN: 5.0000 mg | Freq: Once | ORAL | Status: DC | PRN

## 2021-06-29 MED ORDER — ACETAMINOPHEN 500 MG PO TABS
ORAL_TABLET | ORAL | Status: AC
  Filled 2021-06-29: qty 2

## 2021-06-29 MED ORDER — DIPHENHYDRAMINE HCL 50 MG/ML IJ SOLN: 12.5000 mg | Freq: Four times a day (QID) | INTRAMUSCULAR | Status: DC | PRN

## 2021-06-29 MED ORDER — POLYETHYLENE GLYCOL 3350 17 G PO PACK: 17.0000 g | PACK | Freq: Every day | ORAL | Status: DC | PRN

## 2021-06-29 MED ORDER — ONDANSETRON HCL 4 MG/2ML IJ SOLN
INTRAMUSCULAR | Status: DC | PRN
  Administered 2021-06-29: 4 mg via INTRAVENOUS

## 2021-06-29 MED ORDER — SODIUM CHLORIDE 0.9 % IV SOLN
INTRAVENOUS | Status: AC
  Filled 2021-06-29: qty 10

## 2021-06-29 MED ORDER — BUPIVACAINE LIPOSOME 1.3 % IJ SUSP
INTRAMUSCULAR | Status: DC | PRN
  Administered 2021-06-29: 10 mL via PERINEURAL

## 2021-06-29 MED ORDER — SPY AGENT GREEN - (INDOCYANINE FOR INJECTION)
INTRAMUSCULAR | Status: DC | PRN
  Administered 2021-06-29: 2 mL via INTRAVENOUS

## 2021-06-29 MED ORDER — MIDAZOLAM HCL 2 MG/2ML IJ SOLN
1.0000 mg | Freq: Once | INTRAMUSCULAR | Status: AC
  Administered 2021-06-29: 1 mg via INTRAVENOUS

## 2021-06-29 MED ORDER — BUPIVACAINE HCL (PF) 0.5 % IJ SOLN
INTRAMUSCULAR | Status: DC | PRN
Start: 2021-06-29 — End: 2021-06-29
  Administered 2021-06-29: 20 mL via PERINEURAL

## 2021-06-29 MED ORDER — PHENYLEPHRINE HCL (PRESSORS) 10 MG/ML IV SOLN
INTRAVENOUS | Status: DC | PRN
  Administered 2021-06-29 (×3): 80 ug via INTRAVENOUS

## 2021-06-29 SURGICAL SUPPLY — 106 items
APPLIER CLIP 11 MED OPEN (CLIP)
APPLIER CLIP 9.375 MED OPEN (MISCELLANEOUS) ×2
BAG DECANTER FOR FLEXI CONT (MISCELLANEOUS) ×2 IMPLANT
BENZOIN TINCTURE PRP APPL 2/3 (GAUZE/BANDAGES/DRESSINGS) ×1 IMPLANT
BINDER BREAST LRG (GAUZE/BANDAGES/DRESSINGS) IMPLANT
BINDER BREAST MEDIUM (GAUZE/BANDAGES/DRESSINGS) IMPLANT
BINDER BREAST XLRG (GAUZE/BANDAGES/DRESSINGS) IMPLANT
BINDER BREAST XXLRG (GAUZE/BANDAGES/DRESSINGS) IMPLANT
BIOPATCH RED 1 DISK 7.0 (GAUZE/BANDAGES/DRESSINGS) ×3 IMPLANT
BLADE HEX COATED 2.75 (ELECTRODE) ×1 IMPLANT
BLADE SURG 10 STRL SS (BLADE) ×2 IMPLANT
BLADE SURG 15 STRL LF DISP TIS (BLADE) ×1 IMPLANT
BLADE SURG 15 STRL SS (BLADE) ×2
BNDG ELASTIC 6X10 VLCR STRL LF (GAUZE/BANDAGES/DRESSINGS) ×2 IMPLANT
CANISTER SUCT 1200ML W/VALVE (MISCELLANEOUS) ×2 IMPLANT
CHLORAPREP W/TINT 26 (MISCELLANEOUS) ×4 IMPLANT
CLIP APPLIE 11 MED OPEN (CLIP) IMPLANT
CLIP APPLIE 9.375 MED OPEN (MISCELLANEOUS) ×1 IMPLANT
COVER BACK TABLE 60X90IN (DRAPES) ×2 IMPLANT
COVER MAYO STAND STRL (DRAPES) ×2 IMPLANT
COVER PROBE W GEL 5X96 (DRAPES) ×2 IMPLANT
DERMABOND ADVANCED (GAUZE/BANDAGES/DRESSINGS)
DERMABOND ADVANCED .7 DNX12 (GAUZE/BANDAGES/DRESSINGS) ×2 IMPLANT
DRAIN CHANNEL 15F RND FF W/TCR (WOUND CARE) ×3 IMPLANT
DRAIN CHANNEL 19F RND (DRAIN) ×1 IMPLANT
DRAPE LAPAROSCOPIC ABDOMINAL (DRAPES) ×2 IMPLANT
DRAPE TOP ARMCOVERS (MISCELLANEOUS) ×1 IMPLANT
DRAPE UTILITY XL STRL (DRAPES) ×3 IMPLANT
DRSG PAD ABDOMINAL 8X10 ST (GAUZE/BANDAGES/DRESSINGS) ×4 IMPLANT
DRSG TEGADERM 2-3/8X2-3/4 SM (GAUZE/BANDAGES/DRESSINGS) ×1 IMPLANT
DRSG TEGADERM 4X10 (GAUZE/BANDAGES/DRESSINGS) ×1 IMPLANT
ELECT BLADE 4.0 EZ CLEAN MEGAD (MISCELLANEOUS)
ELECT COATED BLADE 2.86 ST (ELECTRODE) ×1 IMPLANT
ELECT REM PT RETURN 9FT ADLT (ELECTROSURGICAL) ×2
ELECTRODE BLDE 4.0 EZ CLN MEGD (MISCELLANEOUS) IMPLANT
ELECTRODE REM PT RTRN 9FT ADLT (ELECTROSURGICAL) ×1 IMPLANT
EVACUATOR SILICONE 100CC (DRAIN) ×3 IMPLANT
FUNNEL KELLER 2 DISP (MISCELLANEOUS) IMPLANT
GAUZE SPONGE 4X4 12PLY STRL (GAUZE/BANDAGES/DRESSINGS) ×2 IMPLANT
GAUZE SPONGE 4X4 12PLY STRL LF (GAUZE/BANDAGES/DRESSINGS) IMPLANT
GLOVE SRG 8 PF TXTR STRL LF DI (GLOVE) ×1 IMPLANT
GLOVE SURG ENC MOIS LTX SZ7 (GLOVE) ×2 IMPLANT
GLOVE SURG ENC TEXT LTX SZ7.5 (GLOVE) ×4 IMPLANT
GLOVE SURG LTX SZ8 (GLOVE) ×2 IMPLANT
GLOVE SURG POLYISO LF SZ6.5 (GLOVE) ×1 IMPLANT
GLOVE SURG UNDER POLY LF SZ6.5 (GLOVE) ×1 IMPLANT
GLOVE SURG UNDER POLY LF SZ7 (GLOVE) ×1 IMPLANT
GLOVE SURG UNDER POLY LF SZ8 (GLOVE) ×1
GOWN STRL REUS W/ TWL LRG LVL3 (GOWN DISPOSABLE) ×3 IMPLANT
GOWN STRL REUS W/ TWL XL LVL3 (GOWN DISPOSABLE) ×1 IMPLANT
GOWN STRL REUS W/TWL LRG LVL3 (GOWN DISPOSABLE) ×4
GOWN STRL REUS W/TWL XL LVL3 (GOWN DISPOSABLE) ×1
GRAFT FLEX HD 19X22X0.7-1.4 (Tissue) ×1 IMPLANT
ILLUMINATOR WAVEGUIDE N/F (MISCELLANEOUS) IMPLANT
IMPL EXPANDER BREAST 375CC (Breast) IMPLANT
IMPLANT BREAST 375CC (Breast) ×1 IMPLANT
IMPLANT EXPANDER BREAST 375CC (Breast) ×1 IMPLANT
IV NS 500ML (IV SOLUTION) ×1
IV NS 500ML BAXH (IV SOLUTION) IMPLANT
KIT FILL SYSTEM UNIVERSAL (SET/KITS/TRAYS/PACK) ×1 IMPLANT
LIGHT WAVEGUIDE WIDE FLAT (MISCELLANEOUS) IMPLANT
MARKER SKIN DUAL TIP RULER LAB (MISCELLANEOUS) IMPLANT
NDL HYPO 25X1 1.5 SAFETY (NEEDLE) ×2 IMPLANT
NDL SAFETY ECLIPSE 18X1.5 (NEEDLE) ×1 IMPLANT
NEEDLE HYPO 18GX1.5 SHARP (NEEDLE)
NEEDLE HYPO 25X1 1.5 SAFETY (NEEDLE) ×4 IMPLANT
NS IRRIG 1000ML POUR BTL (IV SOLUTION) IMPLANT
PACK BASIN DAY SURGERY FS (CUSTOM PROCEDURE TRAY) ×2 IMPLANT
PACK SPY-PHI (KITS) ×1 IMPLANT
PENCIL SMOKE EVACUATOR (MISCELLANEOUS) ×2 IMPLANT
PIN SAFETY STERILE (MISCELLANEOUS) ×2 IMPLANT
RETRACTOR ONETRAX LX 135X30 (MISCELLANEOUS) IMPLANT
SHEET MEDIUM DRAPE 40X70 STRL (DRAPES) ×3 IMPLANT
SLEEVE SCD COMPRESS KNEE MED (STOCKING) ×2 IMPLANT
SPIKE FLUID TRANSFER (MISCELLANEOUS) IMPLANT
SPONGE T-LAP 18X18 ~~LOC~~+RFID (SPONGE) ×5 IMPLANT
SPONGE T-LAP 4X18 ~~LOC~~+RFID (SPONGE) IMPLANT
STAPLER INSORB 30 2030 C-SECTI (MISCELLANEOUS) ×2 IMPLANT
STAPLER VISISTAT 35W (STAPLE) ×2 IMPLANT
STRIP CLOSURE SKIN 1/2X4 (GAUZE/BANDAGES/DRESSINGS) ×4 IMPLANT
SUT CHROMIC 3 0 SH 27 (SUTURE) IMPLANT
SUT ETHILON 2 0 FS 18 (SUTURE) ×3 IMPLANT
SUT MNCRL AB 3-0 PS2 18 (SUTURE) ×3 IMPLANT
SUT MON AB 3-0 SH 27 (SUTURE)
SUT MON AB 3-0 SH27 (SUTURE) IMPLANT
SUT MON AB 4-0 PC3 18 (SUTURE) ×1 IMPLANT
SUT PDS 3-0 CT2 (SUTURE) ×8
SUT PDS II 3-0 CT2 27 ABS (SUTURE) ×3 IMPLANT
SUT PLAIN 5 0 P 3 18 (SUTURE) IMPLANT
SUT SILK 2 0 PERMA HAND 18 BK (SUTURE) ×2 IMPLANT
SUT SILK 2 0 SH (SUTURE) IMPLANT
SUT VIC AB 3-0 54X BRD REEL (SUTURE) ×1 IMPLANT
SUT VIC AB 3-0 BRD 54 (SUTURE)
SUT VICRYL 3-0 CR8 SH (SUTURE) ×3 IMPLANT
SUT VLOC 180 0 24IN GS25 (SUTURE) ×1 IMPLANT
SUT VLOC 90 P-14 23 (SUTURE) ×2 IMPLANT
SYR BULB IRRIG 60ML STRL (SYRINGE) ×2 IMPLANT
SYR CONTROL 10ML LL (SYRINGE) ×2 IMPLANT
TAPE MEASURE VINYL STERILE (MISCELLANEOUS) IMPLANT
TOWEL GREEN STERILE FF (TOWEL DISPOSABLE) ×4 IMPLANT
TRACER MAGTRACE VIAL (MISCELLANEOUS) ×1 IMPLANT
TRAY DSU PREP LF (CUSTOM PROCEDURE TRAY) ×1 IMPLANT
TRAY FOLEY W/BAG SLVR 14FR LF (SET/KITS/TRAYS/PACK) IMPLANT
TUBE CONNECTING 20X1/4 (TUBING) ×2 IMPLANT
UNDERPAD 30X36 HEAVY ABSORB (UNDERPADS AND DIAPERS) ×4 IMPLANT
YANKAUER SUCT BULB TIP NO VENT (SUCTIONS) ×2 IMPLANT

## 2021-06-29 NOTE — Op Note (Signed)
Operative Note   DATE OF OPERATION: 06/29/2021  SURGICAL DEPARTMENT: Plastic Surgery  PREOPERATIVE DIAGNOSES: Right breast cancer  POSTOPERATIVE DIAGNOSES:  same  PROCEDURE: 1.  Right breast reconstruction with tissue expander and acellular dermal matrix 2.  Indocyanine green angiography right mastectomy flap  SURGEON: Talmadge Coventry, MD  ASSISTANT: Krista Blue, PA The advanced practice practitioner (APP) assisted throughout the case.  The APP was essential in retraction and counter traction when needed to make the case progress smoothly.  This retraction and assistance made it possible to see the tissue planes for the procedure.  The assistance was needed for hemostasis, tissue re-approximation and closure of the incision site.   ANESTHESIA:  General.   COMPLICATIONS: None.   INDICATIONS FOR PROCEDURE:  The patient, [facility] is a 76 y.o. female born on 1945-08-08, is here for treatment of right breast cancer MRN: [id number]  CONSENT:  Informed consent was obtained directly from the patient. Risks, benefits and alternatives were fully discussed. Specific risks including but not limited to bleeding, infection, hematoma, seroma, scarring, pain, contracture, asymmetry, wound healing problems, and need for further surgery were all discussed. The patient did have an ample opportunity to have questions answered to satisfaction.   DESCRIPTION OF PROCEDURE:  The patient was taken to the operating room. SCDs were placed and antibiotics were given.  General anesthesia was administered.  The patient's operative site was prepped and draped in a sterile fashion. A time out was performed and all information was confirmed to be correct.  Dr. Brantley Stage performed that portion of the case.  This was a right mastectomy with axillary lymph node sampling.  He then turned the patient over to me.  And I did explore a firm nodule in the superior chest.  This was initially thought to be the Port-A-Cath  but it turns out that this is an area that appeared to be fat necrosis.  I bovied down to it and excised it with a combination of blunt and cautery dissection and it was sent as a separate specimen.  This was just superior to the origination of the mastectomy skin flap.  I then examined the mastectomy flaps and clinically they looked to be well perfused.  Indocyanine green angiography was then performed showing a small area of slow filling in the superior flap and this was excised in a longitudinal fashion along the scar and sent with the mastectomy specimen.  Hemostasis was then ensured.  I then placed a 15 French drain in the axillary area.  The lateral soft tissues of the chest were then tacked down with a 0 V-Loc suture.  A second 73 French drain was placed into the area of the mastectomy site and secured with a nylon suture.  Pocket was then irrigated copiously with triple antibiotic solution.  I then brought the Flex HD onto the field.  This was soaked in saline to rinse it.  A 3-0 PDS was then run around the periphery as a pursestring.  The expander was then brought onto the field.  This was a Neurosurgeon plus smooth round tissue expander with the prescribed fill volume of 375 cc.  Serial [id number].  All the air was removed the expander.  It was placed within the matrix and the pursestring was tied down.  Suture tabs were brought out at 3, 6, and 9:00.  Stay sutures of 3-0 PDS were then placed the corresponding locations in the chest wall.  They were run through the suture tabs and  the tissue expander was placed into the wound this suture tabs tied down to secure it in position.  100 cc of saline saline was then placed and the expander itself.  This provided no tension on the skin.  I then excised a dogear extending out laterally with a 15 blade and cautery and this was again sent with the specimen.  Hemostasis was ensured and closure was done with interrupted buried Enzor staples and a running 3 oh  V-Loc.  Benzoin Steri-Strips and a soft compressive dressing were applied.  The patient tolerated the procedure well.  There were no complications. The patient was allowed to wake from anesthesia, extubated and taken to the recovery room in satisfactory condition.

## 2021-06-29 NOTE — Anesthesia Procedure Notes (Signed)
Anesthesia Regional Block: Pectoralis block   Pre-Anesthetic Checklist: , timeout performed,  Correct Patient, Correct Site, Correct Laterality,  Correct Procedure, Correct Position, site marked,  Risks and benefits discussed,  Surgical consent,  Pre-op evaluation,  At surgeon's request and post-op pain management  Laterality: Right  Prep: Maximum Sterile Barrier Precautions used, chloraprep       Needles:  Injection technique: Single-shot  Needle Type: Echogenic Stimulator Needle     Needle Length: 9cm  Needle Gauge: 22     Additional Needles:   Procedures:,,,, ultrasound used (permanent image in chart),,    Narrative:  Start time: 06/29/2021 7:10 AM End time: 06/29/2021 7:15 AM Injection made incrementally with aspirations every 5 mL.  Performed by: Personally  Anesthesiologist: Pervis Hocking, DO  Additional Notes: Monitors applied. No increased pain on injection. No increased resistance to injection. Injection made in 5cc increments. Good needle visualization. Patient tolerated procedure well.

## 2021-06-29 NOTE — Anesthesia Procedure Notes (Signed)
Procedure Name: Intubation Date/Time: 06/29/2021 8:03 AM Performed by: Tawni Millers, CRNA Pre-anesthesia Checklist: Patient identified, Emergency Drugs available, Suction available and Patient being monitored Patient Re-evaluated:Patient Re-evaluated prior to induction Oxygen Delivery Method: Circle system utilized Preoxygenation: Pre-oxygenation with 100% oxygen Induction Type: IV induction Ventilation: Mask ventilation without difficulty Laryngoscope Size: Mac and 3 Grade View: Grade II Tube type: Oral Tube size: 7.0 mm Number of attempts: 1 Airway Equipment and Method: Stylet and Oral airway Placement Confirmation: ETT inserted through vocal cords under direct vision, positive ETCO2 and breath sounds checked- equal and bilateral Tube secured with: Tape Dental Injury: Teeth and Oropharynx as per pre-operative assessment

## 2021-06-29 NOTE — Anesthesia Preprocedure Evaluation (Addendum)
Anesthesia Evaluation  Patient identified by MRN, date of birth, ID band Patient awake    Reviewed: Allergy & Precautions, NPO status , Patient's Chart, lab work & pertinent test results  Airway Mallampati: II  TM Distance: >3 FB Neck ROM: Full    Dental no notable dental hx.    Pulmonary neg pulmonary ROS,    Pulmonary exam normal breath sounds clear to auscultation       Cardiovascular negative cardio ROS Normal cardiovascular exam Rhythm:Regular Rate:Normal     Neuro/Psych negative neurological ROS  negative psych ROS   GI/Hepatic negative GI ROS, Neg liver ROS,   Endo/Other  negative endocrine ROS  Renal/GU negative Renal ROS  negative genitourinary   Musculoskeletal  (+) Arthritis , Osteoarthritis,    Abdominal   Peds negative pediatric ROS (+)  Hematology  (+) Blood dyscrasia, anemia , Hb 10.3   Anesthesia Other Findings Right breast ca   Reproductive/Obstetrics negative OB ROS                             Anesthesia Physical Anesthesia Plan  ASA: 2  Anesthesia Plan: General and Regional   Post-op Pain Management: Regional block   Induction: Intravenous  PONV Risk Score and Plan: 3 and Ondansetron, Dexamethasone, Midazolam and Treatment may vary due to age or medical condition  Airway Management Planned: Oral ETT  Additional Equipment: None  Intra-op Plan:   Post-operative Plan: Extubation in OR  Informed Consent: I have reviewed the patients History and Physical, chart, labs and discussed the procedure including the risks, benefits and alternatives for the proposed anesthesia with the patient or authorized representative who has indicated his/her understanding and acceptance.     Dental advisory given  Plan Discussed with: CRNA  Anesthesia Plan Comments:        Anesthesia Quick Evaluation

## 2021-06-29 NOTE — Interval H&P Note (Signed)
History and Physical Interval Note:  06/29/2021 7:16 AM  [facility]  has presented today for surgery, with the diagnosis of RIGHT BREAST CANCER.  The various methods of treatment have been discussed with the patient and family. After consideration of risks, benefits and other options for treatment, the patient has consented to  Procedure(s): RIGHT MASTECTOMY WITH SENTINEL LYMPH NODE BIOPSY (Right) RADIOACTIVE SEED GUIDED SENTINEL LYMPH NODE BIOPSY (Right) BREAST RECONSTRUCTION WITH PLACEMENT OF TISSUE EXPANDER AND FLEX HD (ACELLULAR HYDRATED DERMIS) (Right) as a surgical intervention.  The patient's history has been reviewed, patient examined, no change in status, stable for surgery.  I have reviewed the patient's chart and labs.  Questions were answered to the patient's satisfaction.    Seed could not be placed for targeted LN Plan SLN mapping and remove any other suspicious nodes  No survival benefit for ALND  especially given her age over 55 and good response to chemotherapy    [facility]

## 2021-06-29 NOTE — Progress Notes (Signed)
Assisted Dr. Doroteo Glassman with right, ultrasound guided, pectoralis block. Side rails up, monitors on throughout procedure. See vital signs in flow sheet. Tolerated Procedure well.

## 2021-06-29 NOTE — Anesthesia Postprocedure Evaluation (Signed)
Anesthesia Post Note  Patient: Marie Day  Procedure(s) Performed: RIGHT MASTECTOMY WITH SENTINEL LYMPH NODE BIOPSY (Right: Breast) RIGHT BREAST RECONSTRUCTION WITH PLACEMENT OF TISSUE EXPANDER AND FLEX HD (ACELLULAR HYDRATED DERMIS) (Right: Breast)     Patient location during evaluation: PACU Anesthesia Type: Regional and General Level of consciousness: awake Pain management: pain level controlled Vital Signs Assessment: post-procedure vital signs reviewed and stable Respiratory status: spontaneous breathing and respiratory function stable Cardiovascular status: stable Postop Assessment: no apparent nausea or vomiting Anesthetic complications: no   No notable events documented.  Last Vitals:  Vitals:   06/29/21 1030 06/29/21 1045  BP: 139/67 (!) 153/74  Pulse: 77 75  Resp: 19 17  Temp:    SpO2: 98% 100%    Last Pain:  Vitals:   06/29/21 1045  TempSrc:   PainSc: 1                  Candra R Chrystle Murillo

## 2021-06-29 NOTE — Op Note (Signed)
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  Right x-ray contents containing sentinel node as well as targeted node with Savi scout clip verified with Faxitron  Drains: 219 round  Indications for procedure: The patient is a 76 year old female who recently moved to the area from [place].  She is diagnosed with stage II right breast cancer and received neoadjuvant chemotherapy.  She then moved into this area to finish her treatment.  She was referred to medical oncology for discussion of surgical options.  She had a very good response to neoadjuvant chemotherapy with due to what appeared to be multifocal disease mastectomy was recommended and she opted for immediate reconstruction.  She was seen by plastic surgery.  She presents today for right simple mastectomy with right axillary sentinel lymph node mapping as well as targeted lymph node biopsy.  Of note, due to her Biagio Quint scout clip in the lymph node radiology could not place a seed in the node because they could not visualize the clip.  I discussed this in the holding area the patient.  Given the fact that we were going to map her and I can actually take an image of the sentinel node we could check for the clip to verify that the actual node that was biopsied was removed.  She understood this process I explained his parotid impact her survival whatsoever but certainly every attempt will be made to remove the node but I wish to avoid a node dissection given her advanced age as well as high risk of lymphedema since she would recover slower and have a higher risk after radiation therapy of significant chest  wall and right arm lymphedema.The surgical and non surgical options have been discussed with the patient.  Risks of surgery include bleeding,  Infection,  Flap necrosis,  Tissue loss,  Chronic pain, death, Numbness,  And the need for additional procedures.  Reconstruction options also have been discussed with the patient as well.  The patient agrees to proceed. Sentinel lymph node mapping and dissection has been discussed with the patient.  Risk of bleeding,  Infection,  Seroma formation,  Additional procedures,,  Shoulder weakness ,  Shoulder stiffness,  Nerve and blood vessel injury and reaction to the mapping dyes have been discussed.  Alternatives to surgery have been discussed with the patient.  The patient agrees to proceed.     Description of procedure: The patient was met in the holding area and questions were answered.  The right breast was marked as the correct site and all questions were answered.  She underwent a pectoral block per anesthesia protocol.  She was then taken back to the operative room.  She was placed upon upon the OR table.  After induction of general esthesia, a timeout was performed and 2 cc of mag trace were injected into the right nipple under sterile conditions.  5 minutes of massage was then done.  The upper chest was then prepped and draped in a sterile fashion.  A second timeout was performed.  Proper patient, site and procedure were verified.  Curvilinear incision was made above and below the nipple areolar complex.  A superior skin flap was taken up to the clavicle.  The inferior skin flap was taken down to the inframammary fold.  She did have a nodule noted just below the clavicle on the right.  There is a history of trauma to this area.  Inferior skin flap taken down to the inframammary fold.  The breast was then dissected in a medial to lateral fashion off the chest wall with good hemostasis.  Upon moving the breast the mag seed probe was used.  There was some uptake in  the specimen itself in the axillary tail.  A Faxitron image was done to look at the tail.  There was a Savi scout clip and there but this may have been from [place] with other additional markers.  Of note they were unable to place a seed in the node that had a clip in it since a Savi scout clip was used in [place].  Radiologist cannot visualize this.  This was discussed preoperatively.  Patient was aware of this as well.  We opted to go ahead and do sentinel lymph node mapping and then take a image of the node to see if that corresponded to the node that was clipped prior to initiation of neoadjuvant chemotherapy.  Using the mag tracer probe, is able to map out the sentinel node in the right axilla.  This was mildly enlarged.  There were 4 nodes associated with it that took the tracer up.  These were all removed.  The image on the Faxitron revealed the Savi scout clip to be in the node which verified that this was the node that was biopsied prior to the initiation of chemotherapy.  Background counts approached baseline using the probe and the remainder of the usable.  The long thoracic nerve, thoracodorsal trunk and extra vein were all preserved.  Inspection for hemostasis was excellent.  Skin flaps were viable.  At this point Dr. Pae scrubbed in.  There is a nodule noted at the superior skin flap.  Dr. Pace was able to open this and this appeared to be an old area of fat necrosis secondary to trauma.  Of note her Port-A-Cath was on her left side.  This area was cleaned out and the tissue was sent for specimen but had the appearance of fat necrosis and was mostly involving the skin below her clavicle.  Reinspection for hemostasis was good.  At this point scrubbed out of the case.  Dr. Pace took over and will dictate the remainder of the report.  All final counts were found to be correct. °

## 2021-06-29 NOTE — H&P (Signed)
History of Present Illness: Marie Day is a 76 y.o. female who is seen today as an office consultation at the request of Dr. Lindi Adie for evaluation of right breast cancer  Patient presents for management of right breast cancer. She was diagnosed in [place] back in June 2022. She was found to have a 4.7 cm area of calcification mass right breast upper outer quadrant with what appeared to be 2 enlarged lymph nodes in the right axilla as well as 2 nodes within the right breast. By report she underwent core biopsy which showed grade 3 ER negative PR negative HER2/neu positive breast cancer intraductal subtype with metastasis to lymph node right axilla. PET scan did not show any other systemic disease. She then moved to [facility] and was seen by Dr. Blenda Peals in August 2022 where neoadjuvant chemotherapy recommended. A port had been placed in [place] but no therapy initiated at that time. MRI showed multiple areas of disease in the right breast the biggest being a mass 5 cm. The other areas of density were not biopsied and fortunately. She has completed chemotherapy and has had what appears to be a clinically complete response by MRI follow-up. She is here today to discuss surgical options. She is just finished chemotherapy about 2 weeks ago. She is quite weak but is overall done well..   Malignant neoplasm of upper-outer quadrant of right breast in female, estrogen receptor negative ([place]) 11/17/2020:Work-up performed at [facility]: Palpable right breast mass.  Mammogram and ultrasound 11/10/2020: Spiculated 4.9 cm mass UOQ right breast with microcalcifications, biopsy revealed IDC ER/PR negative, HER2 positive 3+ by IHC, axillary lymph node positive (2 lymph nodes were detected)   Genetics were negative   Treatment Plan based on multidisciplinary tumor board: 1. Neoadjuvant chemotherapy with TCH Perjeta 6 cycles (started 12/29/2020) followed by Herceptin Perjeta maintenance versus  Kadcyla maintenance (based on response to neoadjuvant chemo) for 1 year 2. mastectomy with targeted node surgery (because of the additional positive biopsies) 3. Followed by adjuvant radiation therapy   Breast MRI 01/05/2021: Large area of non-mass enhancement 7 cm right breast UOQ.  Non-mass enhancement 2.5 cm central breast, third area of suspicious non-mass enhancement anterior UOQ 1.7 cm: MRI guided biopsy of non-mass enhancement in the central and anterolateral right breast 01/20/2021.  Intramammary lymph node Second Look ultrasound: Benign ------------------------------------------------------------------------------------------------------------------- Patient with history right breast cancer status post chemotherapy. Assess for treatment response.   EXAM: BILATERAL BREAST MRI WITH AND WITHOUT CONTRAST   TECHNIQUE: Multiplanar, multisequence MR images of both breasts were obtained prior to and following the intravenous administration of 7 ml of Gadavist   Three-dimensional MR images were rendered by post-processing of the original MR data on an independent workstation. The three-dimensional MR images were interpreted, and findings are reported in the following complete MRI report for this study. Three dimensional images were evaluated at the independent interpreting workstation using the DynaCAD thin client.   COMPARISON: Previous exam(s).   FINDINGS: Breast composition: b. Scattered fibroglandular tissue.   Background parenchymal enhancement: Minimal   Right breast: There are 3 sites of susceptibility artifact within the right breast compatible with biopsy marking clips. No residual suspicious enhancement identified within the right breast.   Left breast: No mass or abnormal enhancement.   Lymph nodes: No abnormal appearing lymph nodes.   Ancillary findings: None.   IMPRESSION: Findings compatible with interval treatment response.   RECOMMENDATION: Treatment plan  for known right breast malignancy.   BI-RADS CATEGORY 6: Known  biopsy-proven malignancy.     Electronically Signed By: Lovey Newcomer M.D. On: 04/14/2021 11:39  Review of Systems: A complete review of systems was obtained from the patient. I have reviewed this information and discussed as appropriate with the patient. See HPI as well for other ROS.    Medical History: Past Medical History:  Diagnosis Date   History of cancer   There is no problem list on file for this patient.  Past Surgical History:  Procedure Laterality Date   COMBINED HYSTEROSCOPY DIAGNOSTIC / D&C    Allergies  Allergen Reactions   Sulfa (Sulfonamide Antibiotics) Hives  Pt reports "foggy brain, seeing spots"   No current outpatient medications on file prior to visit.   No current facility-administered medications on file prior to visit.   Family History  Family history unknown: Yes    Social History   Tobacco Use  Smoking Status Never  Smokeless Tobacco Never    Social History   Socioeconomic History   Marital status: Married  Tobacco Use   Smoking status: Never   Smokeless tobacco: Never  Substance and Sexual Activity   Alcohol use: Never   Drug use: Yes  Comment: CBD   Objective:   Vitals:  04/26/21 1004  BP: 136/70  Pulse: 103  Temp: 36.7 C (98.1 F)  SpO2: 98%  Weight: 68.9 kg (151 lb 12.8 oz)  Height: 166.4 cm (5' 5.5")   Body mass index is 24.88 kg/m.  Physical Exam Constitutional:  Appearance: Normal appearance.  HENT:  Head: Normocephalic.  Eyes:  General: No scleral icterus. Pupils: Pupils are equal, round, and reactive to light.  Pulmonary:  Effort: Pulmonary effort is normal.  Chest:  Breasts: Right: Normal.  Left: Normal.   Musculoskeletal:  Cervical back: Normal range of motion.  Lymphadenopathy:  Upper Body:  Right upper body: Axillary adenopathy present. No supraclavicular adenopathy.  Left upper body: No supraclavicular or axillary adenopathy.   Neurological:  General: No focal deficit present.  Mental Status: She is alert and oriented to person, place, and time.  Psychiatric:  Mood and Affect: Mood normal.  Behavior: Behavior normal.     Labs, Imaging and Diagnostic Testing: Assess for treatment response.   EXAM: BILATERAL BREAST MRI WITH AND WITHOUT CONTRAST   TECHNIQUE: Multiplanar, multisequence MR images of both breasts were obtained prior to and following the intravenous administration of 7 ml of Gadavist   Three-dimensional MR images were rendered by post-processing of the original MR data on an independent workstation. The three-dimensional MR images were interpreted, and findings are reported in the following complete MRI report for this study. Three dimensional images were evaluated at the independent interpreting workstation using the DynaCAD thin client.   COMPARISON: Previous exam(s).   FINDINGS: Breast composition: b. Scattered fibroglandular tissue.   Background parenchymal enhancement: Minimal   Right breast: There are 3 sites of susceptibility artifact within the right breast compatible with biopsy marking clips. No residual suspicious enhancement identified within the right breast.   Left breast: No mass or abnormal enhancement.   Lymph nodes: No abnormal appearing lymph nodes.   Ancillary findings: None.   IMPRESSION: Findings compatible with interval treatment response.   RECOMMENDATION: Treatment plan for known right breast malignancy.   BI-RADS CATEGORY 6: Known biopsy-proven malignancy.     Electronically Signed By: Lovey Newcomer M.D. On: 04/14/2021 11:39  Assessment and Plan:  Diagnoses and all orders for this visit:  Stage II breast cancer, right (CMS-HCC) - Ambulatory Referral to Plastic Surgery -  Ambulatory Referral to Physical Therapy    Reviewed data from [place] as well as Dr. Geralyn Flash office in the chart. Discussed surgical options. Given the amount of  disease at the time of initial diagnosis, recommend right simple mastectomy with or without reconstruction. She will finish the remainder of her chemotherapy as her port can stay in. Of note this mass was present for least 6 months before she sought medical care. Given the amount of disease in her MRI when she started, I am not sure lumpectomy breast or breast conserving surgery possible since the other areas were not biopsied and were highly suspicious. She understands and is agreement to proceed with right simple mastectomy with targeted right axillary lymph node biopsy and sentinel lymph node mapping. Risk of bleeding, infection, lymphedema, arm stiffness, damage to nerves, blood vessels veins in the armpit, skin and flap necrosis, recurrence, and the need for other additional adjuvant therapies discussed with her today. Refer to plastic surgery for opinion about reconstruction.  No follow-ups on file.

## 2021-06-29 NOTE — Transfer of Care (Signed)
Immediate Anesthesia Transfer of Care Note  Patient: Marie Day  Procedure(s) Performed: RIGHT MASTECTOMY WITH SENTINEL LYMPH NODE BIOPSY (Right: Breast) RIGHT BREAST RECONSTRUCTION WITH PLACEMENT OF TISSUE EXPANDER AND FLEX HD (ACELLULAR HYDRATED DERMIS) (Right: Breast)  Patient Location: PACU  Anesthesia Type:GA combined with regional for post-op pain  Level of Consciousness: awake  Airway & Oxygen Therapy: Patient Spontanous Breathing and Patient connected to face mask oxygen  Post-op Assessment: Report given to RN and Post -op Vital signs reviewed and stable  Post vital signs: Reviewed and stable  Last Vitals:  Vitals Value Taken Time  BP 131/58 06/29/21 1019  Temp    Pulse 79 06/29/21 1021  Resp 19 06/29/21 1021  SpO2 99 % 06/29/21 1021  Vitals shown include unvalidated device data.  Last Pain:  Vitals:   06/29/21 0659  TempSrc: Oral  PainSc: 0-No pain         Complications: No notable events documented.

## 2021-06-29 NOTE — Discharge Instructions (Addendum)
Activity: Avoid strenuous activity.  No heavy lifting.  Diet: No restrictions.  Try to optimize nutrition with plenty of fruits and vegetables to improve healing.  Wound Care: Leave ACE wrap for the next week.  In three days you may remove to shower.  Please turn your back to the shower head. Sponge bathing in interim.  After ACE wrap comes off recommend sports bra for gentle compression.  Avoid any bra with under-wire until cleared by Dr. Claudia Desanctis.  Please record output volumes of each drain (individually) on a daily basis.  Bring this data to your follow-up appointments until it is determined that they can be safely removed.    Follow-Up: Scheduled for next week.  Things to watch for:  Call the office if you experience fever, chills, persistent nausea, or significant bleeding.  Mild wound drainage is common after breast reduction surgery and should not be cause for alarm.     About my Jackson-Pratt Bulb Drain  What is a Jackson-Pratt bulb? A Jackson-Pratt is a soft, round device used to collect drainage. It is connected to a long, thin drainage catheter, which is held in place by one or two small stiches near your surgical incision site. When the bulb is squeezed, it forms a vacuum, forcing the drainage to empty into the bulb.  Emptying the Jackson-Pratt bulb- To empty the bulb: 1. Release the plug on the top of the bulb. 2. Pour the bulb's contents into a measuring container which your nurse will provide. 3. Record the time emptied and amount of drainage. Empty the drain(s) as often as your     doctor or nurse recommends.  Date                  Time                    Amount (Drain 1)                 Amount (Drain  2)  _____________________________________________________________________________________________  _____________________________________________________________________________________________  _____________________________________________________________________________________________  _____________________________________________________________________________________________  _____________________________________________________________________________________________  _____________________________________________________________________________________________  _____________________________________________________________________________________________  _____________________________________________________________________________________________  Squeezing the Jackson-Pratt Bulb- To squeeze the bulb: 1. Make sure the plug at the top of the bulb is open. 2. Squeeze the bulb tightly in your fist. You will hear air squeezing from the bulb. 3. Replace the plug while the bulb is squeezed. 4. Use a safety pin to attach the bulb to your clothing. This will keep the catheter from     pulling at the bulb insertion site.  When to call your doctor- Call your doctor if: Drain site becomes red, swollen or hot. You have a fever greater than 101 degrees F. There is oozing at the drain site. Drain falls out (apply a guaze bandage over the drain hole and secure it with tape). Drainage increases daily not related to activity patterns. (You will usually have more drainage when you are active than when you are resting.) Drainage has a bad odor.   Information for Discharge Teaching: EXPAREL (bupivacaine liposome injectable suspension)   Your surgeon or anesthesiologist gave you EXPAREL(bupivacaine) to help control your pain after surgery.  EXPAREL is a local anesthetic that provides pain relief by numbing the tissue around the surgical site. EXPAREL is designed to release pain medication  over time and can control pain for up to 72 hours. Depending on how you respond to EXPAREL, you may require less pain medication during your recovery.  Possible side effects: Temporary loss of sensation or  ability to move in the area where bupivacaine was injected. Nausea, vomiting, constipation Rarely, numbness and tingling in your mouth or lips, lightheadedness, or anxiety may occur. Call your doctor right away if you think you may be experiencing any of these sensations, or if you have other questions regarding possible side effects.  Follow all other discharge instructions given to you by your surgeon or nurse. Eat a healthy diet and drink plenty of water or other fluids.  If you return to the hospital for any reason within 96 hours following the administration of EXPAREL, it is important for health care providers to know that you have received this anesthetic. A teal colored band has been placed on your arm with the date, time and amount of EXPAREL you have received in order to alert and inform your health care providers. Please leave this armband in place for the full 96 hours following administration, and then you may remove the band.

## 2021-06-30 ENCOUNTER — Encounter (HOSPITAL_BASED_OUTPATIENT_CLINIC_OR_DEPARTMENT_OTHER): Payer: Self-pay | Admitting: Surgery

## 2021-06-30 NOTE — Discharge Summary (Signed)
Physician Discharge Summary  Patient ID: Marie Day MRN: [id number] DOB/AGE: 11/19/1945 76 y.o.  Admit date: 06/29/2021 Discharge date: 06/30/2021  Admission Diagnoses: Malignant neoplasm of upper-outer quadrant of right breast in female, estrogen receptor negative  Discharge Diagnoses:  Principal Problem:   S/P breast reconstruction   Discharged Condition: good  Hospital Course: 76 year old female presented to the [name] on 06/29/2021 for right mastectomy with sentinel lymph node biopsy by Dr. Brantley Stage followed by immediate right breast reconstruction with placement of tissue expander and Flex HD with Dr. Claudia Desanctis. Patient remained overnight for observation.  No acute overnight events.  Husband and family at bedside.  Patient reports overall she is doing well.  She had some tenderness last night, reports the breast wrap feels tight.  She is not having any infectious symptoms.  She has a few questions about care at home.   Consults: None  Significant Diagnostic Studies: None  Treatments: Robaxin, oxycodone, Ancef  Discharge Exam: Blood pressure 101/60, pulse 78, temperature 99.1 F (37.3 C), resp. rate 18, height 5' 5.5" (1.664 m), weight 66.8 kg, SpO2 95 %. General appearance: alert, cooperative, no distress, and resting in bed, family at bedside Head: Normocephalic, without obvious abnormality, atraumatic Breasts: Normal appearance of left breast, right breast status postmastectomy with expander in place.  2 JP drains in place with serosanguineous drainage in bulb.  No erythema or cellulitic changes noted.  Bilateral mastectomy flaps are viable, no skin necrosis is noted.  There is some edema and swelling as expected.  No ecchymosis. Extremities: extremities normal, atraumatic, no cyanosis or edema Pulses: 2+ and symmetric Skin: No rashes or lesions noted.  Disposition: Discharge disposition: 01-Home or Self Care       Discharge Instructions      Call MD for:  difficulty breathing, headache or visual disturbances   Complete by: As directed    Call MD for:  extreme fatigue   Complete by: As directed    Call MD for:  hives   Complete by: As directed    Call MD for:  persistant dizziness or light-headedness   Complete by: As directed    Call MD for:  persistant nausea and vomiting   Complete by: As directed    Call MD for:  redness, tenderness, or signs of infection (pain, swelling, redness, odor or green/yellow discharge around incision site)   Complete by: As directed    Call MD for:  severe uncontrolled pain   Complete by: As directed    Call MD for:  temperature >100.4   Complete by: As directed    Diet - low sodium heart healthy   Complete by: As directed    Increase activity slowly   Complete by: As directed       Allergies as of 06/30/2021       Reactions   Sulfa Antibiotics Hives, Other (See Comments)   Pt reports "foggy brain, seeing spots"        Medication List     TAKE these medications    lidocaine-prilocaine cream Commonly known as: EMLA Apply to affected area once   ondansetron 8 MG tablet Commonly known as: Zofran Take 1 tablet (8 mg total) by mouth 2 (two) times daily as needed (Nausea or vomiting). Start on the third day after chemotherapy.   prochlorperazine 10 MG tablet Commonly known as: COMPAZINE TAKE 1 TABLET (10 MG TOTAL) BY MOUTH EVERY 6 (SIX) HOURS AS NEEDED (NAUSEA OR VOMITING).   TYLENOL  8 HOUR ARTHRITIS PAIN PO Take by mouth.         [facility] [address] [place], [place] [address] [phone]  Signed: [name] Alsha Day 06/30/2021, 3:13 PM

## 2021-07-01 ENCOUNTER — Encounter: Payer: Self-pay | Admitting: Surgery

## 2021-07-01 LAB — SURGICAL PATHOLOGY

## 2021-07-05 ENCOUNTER — Encounter: Payer: Self-pay | Admitting: *Deleted

## 2021-07-05 NOTE — Progress Notes (Signed)
Patient Care Team: Pcp, No as PCP - General Mauro Kaufmann, RN as Oncology Nurse Navigator Rockwell Germany, RN as Oncology Nurse Navigator  DIAGNOSIS:    ICD-10-CM   1. Malignant neoplasm of upper-outer quadrant of right breast in female, estrogen receptor negative ([place])  C50.411    Z17.1       SUMMARY OF ONCOLOGIC HISTORY: Oncology History  Malignant neoplasm of upper-outer quadrant of right breast in female, estrogen receptor negative ([place])  11/17/2020 Initial Diagnosis   Work-up performed at [facility]: Palpable right breast mass.  Mammogram and ultrasound 11/10/2020: Spiculated 4.9 cm mass UOQ right breast with microcalcifications, biopsy revealed IDC ER/PR negative, HER2 positive 3+ by IHC, axillary lymph node positive (2 lymph nodes were detected)   12/03/2020 PET scan   Right breast malignancy SUV 4.6, subcentimeter right level 1 and 2 axillary lymph nodes.  No distant metastatic disease.   12/21/2020 Cancer Staging   Staging form: Breast, AJCC 8th Edition - Clinical stage from 12/21/2020: Stage IIB (cT2, cN1, cM0, G3, ER-, PR-, HER2+) - Signed by Nicholas Lose, MD on 12/21/2020 Stage prefix: Initial diagnosis Histologic grading system: 3 grade system    12/29/2020 -  Chemotherapy   Patient is on Treatment Plan : BREAST  Docetaxel + Carboplatin + Trastuzumab + Pertuzumab  (TCHP) q21d      06/29/2021 Surgery   Right mastectomy: No residual cancer identified, 0/4 lymph nodes negative, complete pathologic response     CHIEF COMPLIANT: Herceptin and Perjeta  INTERVAL HISTORY: Marie Day is a 76 y.o. with above-mentioned history of invasive ductal carcinoma of the right breast, completed chemotherapy with [place]. She presents to the clinic today for Herceptin and Perjeta maintenance.  She is currently healing and recovering from the recent mastectomy surgery.  She still has drains in place.  She tells me that the arthritis is returning back.  Still feels moderately  fatigued.  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  ALLERGIES:  is allergic to sulfa antibiotics.  MEDICATIONS:  Current Outpatient Medications  Medication Sig Dispense Refill   Acetaminophen (TYLENOL 8 HOUR ARTHRITIS PAIN PO) Take by mouth.     lidocaine-prilocaine (EMLA) cream Apply to affected area once 30 g 3   ondansetron (ZOFRAN) 8 MG tablet Take 1 tablet (8 mg total) by mouth 2 (two) times daily as needed (Nausea or vomiting). Start on the third day after chemotherapy. 30 tablet 1   prochlorperazine (COMPAZINE) 10 MG tablet TAKE 1 TABLET (10 MG TOTAL) BY MOUTH EVERY 6 (SIX) HOURS AS NEEDED (NAUSEA OR VOMITING). 30 tablet 1   No current facility-administered medications for this visit.    PHYSICAL EXAMINATION: ECOG PERFORMANCE STATUS: 1 - Symptomatic but completely ambulatory  Vitals:   07/06/21 1104  BP: 114/75  Pulse: 98  Resp: 18  Temp: (!) 97.2 F (36.2 C)  SpO2: 99%   Filed Weights   07/06/21 1104  Weight: 144 lb 11.2 oz (65.6 kg)    LABORATORY DATA:  I have reviewed the data as listed CMP Latest Ref Rng & Units 06/28/2021 06/15/2021 05/25/2021  Glucose 70 - 99 mg/dL 89 133(H) 106(H)  BUN 8 - 23 mg/dL $Remove'8 10 12  'NvAWKlj$ Creatinine 0.44 - 1.00 mg/dL 0.70 0.68 0.68  Sodium 135 - 145 mmol/L 139 141 139  Potassium 3.5 - 5.1 mmol/L 3.5 3.7 3.8  Chloride 98 - 111 mmol/L 106 105 103  CO2 22 - 32 mmol/L 25 28 28  Calcium 8.9 - 10.3 mg/dL 9.2 9.5 9.4  Total Protein 6.5 - 8.1 g/dL 6.4(L) 6.7 6.7  Total Bilirubin 0.3 - 1.2 mg/dL 0.2(L) 0.4 0.3  Alkaline Phos 38 - 126 U/L 89 96 100  AST 15 - 41 U/L 16 16 14(L)  ALT 0 - 44 U/L $Remo'16 16 10    'TLSkJ$ Lab Results  Component Value Date   WBC 8.0 07/06/2021   HGB 10.3 (L) 07/06/2021   HCT 32.2 (L) 07/06/2021   MCV 90.4 07/06/2021   PLT 339 07/06/2021   NEUTROABS 4.5 07/06/2021    ASSESSMENT & PLAN:  Malignant neoplasm of upper-outer quadrant of right breast in female, estrogen receptor negative  ([place]) 11/17/2020:Work-up performed at [facility]: Palpable right breast mass.  Mammogram and ultrasound 11/10/2020: Spiculated 4.9 cm mass UOQ right breast with microcalcifications, biopsy revealed IDC ER/PR negative, HER2 positive 3+ by IHC, axillary lymph node positive (2 lymph nodes were detected)   Genetics were negative   Treatment Plan based on multidisciplinary tumor board: 1. Neoadjuvant chemotherapy with TCH Perjeta 6 cycles (started 12/29/2020-04/13/2021) followed by Herceptin Perjeta maintenance  for 1 year 2. 06/29/2021:Right mastectomy: No residual cancer identified, 0/4 lymph nodes negative, complete pathologic response 3. Followed by adjuvant radiation therapy ------------------------------------------------------------------------------------------------------------------------- Pathology counseling: I discussed the final pathology report of the patient provided  a copy of this report. I discussed the margins as well as lymph node surgeries. We also discussed the final staging along with previously performed ER/PR and HER-2/neu testing.  Continue with Herceptin Perjeta maintenance every 3 weeks.  I will send a referral to radiation oncology.   No orders of the defined types were placed in this encounter.  The patient has a good understanding of the overall plan. she agrees with it. she will call with any problems that may develop before the next visit here.  Total time spent: 30 mins including face to face time and time spent for planning, charting and coordination of care  Rulon Eisenmenger, MD, MPH 07/06/2021  I, Thana Ates, am acting as scribe for Dr. Nicholas Lose.  I have reviewed the above documentation for accuracy and completeness, and I agree with the above.

## 2021-07-06 ENCOUNTER — Inpatient Hospital Stay (HOSPITAL_BASED_OUTPATIENT_CLINIC_OR_DEPARTMENT_OTHER): Payer: Medicare Other | Admitting: Hematology and Oncology

## 2021-07-06 ENCOUNTER — Inpatient Hospital Stay: Payer: Medicare Other | Attending: Hematology and Oncology

## 2021-07-06 ENCOUNTER — Inpatient Hospital Stay: Payer: Medicare Other

## 2021-07-06 ENCOUNTER — Other Ambulatory Visit: Payer: Self-pay

## 2021-07-06 ENCOUNTER — Telehealth: Payer: Self-pay | Admitting: Radiation Oncology

## 2021-07-06 ENCOUNTER — Encounter: Payer: Self-pay | Admitting: *Deleted

## 2021-07-06 DIAGNOSIS — Z5112 Encounter for antineoplastic immunotherapy: Secondary | ICD-10-CM | POA: Insufficient documentation

## 2021-07-06 DIAGNOSIS — G629 Polyneuropathy, unspecified: Secondary | ICD-10-CM | POA: Diagnosis not present

## 2021-07-06 DIAGNOSIS — C50411 Malignant neoplasm of upper-outer quadrant of right female breast: Secondary | ICD-10-CM

## 2021-07-06 DIAGNOSIS — R5383 Other fatigue: Secondary | ICD-10-CM | POA: Diagnosis not present

## 2021-07-06 DIAGNOSIS — Z9011 Acquired absence of right breast and nipple: Secondary | ICD-10-CM | POA: Diagnosis not present

## 2021-07-06 DIAGNOSIS — Z171 Estrogen receptor negative status [ER-]: Secondary | ICD-10-CM

## 2021-07-06 DIAGNOSIS — Z95828 Presence of other vascular implants and grafts: Secondary | ICD-10-CM

## 2021-07-06 LAB — CBC WITH DIFFERENTIAL (CANCER CENTER ONLY)
Abs Immature Granulocytes: 0.02 10*3/uL (ref 0.00–0.07)
Basophils Absolute: 0.1 10*3/uL (ref 0.0–0.1)
Basophils Relative: 1 %
Eosinophils Absolute: 0.2 10*3/uL (ref 0.0–0.5)
Eosinophils Relative: 3 %
HCT: 32.2 % — ABNORMAL LOW (ref 36.0–46.0)
Hemoglobin: 10.3 g/dL — ABNORMAL LOW (ref 12.0–15.0)
Immature Granulocytes: 0 %
Lymphocytes Relative: 33 %
Lymphs Abs: 2.6 10*3/uL (ref 0.7–4.0)
MCH: 28.9 pg (ref 26.0–34.0)
MCHC: 32 g/dL (ref 30.0–36.0)
MCV: 90.4 fL (ref 80.0–100.0)
Monocytes Absolute: 0.6 10*3/uL (ref 0.1–1.0)
Monocytes Relative: 8 %
Neutro Abs: 4.5 10*3/uL (ref 1.7–7.7)
Neutrophils Relative %: 55 %
Platelet Count: 339 10*3/uL (ref 150–400)
RBC: 3.56 MIL/uL — ABNORMAL LOW (ref 3.87–5.11)
RDW: 13.1 % (ref 11.5–15.5)
WBC Count: 8 10*3/uL (ref 4.0–10.5)
nRBC: 0 % (ref 0.0–0.2)

## 2021-07-06 LAB — CMP (CANCER CENTER ONLY)
ALT: 14 U/L (ref 0–44)
AST: 17 U/L (ref 15–41)
Albumin: 3.8 g/dL (ref 3.5–5.0)
Alkaline Phosphatase: 90 U/L (ref 38–126)
Anion gap: 6 (ref 5–15)
BUN: 12 mg/dL (ref 8–23)
CO2: 27 mmol/L (ref 22–32)
Calcium: 9.2 mg/dL (ref 8.9–10.3)
Chloride: 106 mmol/L (ref 98–111)
Creatinine: 0.74 mg/dL (ref 0.44–1.00)
GFR, Estimated: >60 mL/min (ref >60)
Glucose, Bld: 123 mg/dL — ABNORMAL HIGH (ref 70–99)
Potassium: 3.4 mmol/L — ABNORMAL LOW (ref 3.5–5.1)
Sodium: 139 mmol/L (ref 135–145)
Total Bilirubin: 0.2 mg/dL — ABNORMAL LOW (ref 0.3–1.2)
Total Protein: 6.7 g/dL (ref 6.5–8.1)

## 2021-07-06 MED ORDER — SODIUM CHLORIDE 0.9% FLUSH
10.0000 mL | INTRAVENOUS | Status: DC | PRN
  Administered 2021-07-06: 10 mL

## 2021-07-06 MED ORDER — ACETAMINOPHEN 325 MG PO TABS
650.0000 mg | ORAL_TABLET | Freq: Once | ORAL | Status: AC
  Administered 2021-07-06: 650 mg via ORAL
  Filled 2021-07-06: qty 2

## 2021-07-06 MED ORDER — DIPHENHYDRAMINE HCL 25 MG PO CAPS
50.0000 mg | ORAL_CAPSULE | Freq: Once | ORAL | Status: AC
  Administered 2021-07-06: 50 mg via ORAL
  Filled 2021-07-06: qty 2

## 2021-07-06 MED ORDER — SODIUM CHLORIDE 0.9% FLUSH
10.0000 mL | Freq: Once | INTRAVENOUS | Status: AC
  Administered 2021-07-06: 10 mL

## 2021-07-06 MED ORDER — SODIUM CHLORIDE 0.9 % IV SOLN
420.0000 mg | Freq: Once | INTRAVENOUS | Status: AC
  Administered 2021-07-06: 420 mg via INTRAVENOUS
  Filled 2021-07-06: qty 14

## 2021-07-06 MED ORDER — SODIUM CHLORIDE 0.9 % IV SOLN: Freq: Once | INTRAVENOUS | Status: AC

## 2021-07-06 MED ORDER — TRASTUZUMAB-DKST CHEMO 150 MG IV SOLR
400.0000 mg | Freq: Once | INTRAVENOUS | Status: AC
  Administered 2021-07-06: 400 mg via INTRAVENOUS
  Filled 2021-07-06: qty 19.05

## 2021-07-06 NOTE — Patient Instructions (Signed)
[facility]  Discharge Instructions: Thank you for choosing [facility] to provide your oncology and hematology care.   If you have a lab appointment with the [facility], please go directly to the [facility] and check in at the registration area.   Wear comfortable clothing and clothing appropriate for easy access to any Portacath or PICC line.   We strive to give you quality time with your provider. You may need to reschedule your appointment if you arrive late (15 or more minutes).  Arriving late affects you and other patients whose appointments are after yours.  Also, if you miss three or more appointments without notifying the office, you may be dismissed from the clinic at the providers discretion.      For prescription refill requests, have your pharmacy contact our office and allow 72 hours for refills to be completed.    Today you received the following chemotherapy and/or immunotherapy agents herceptin, perjeta      To help prevent nausea and vomiting after your treatment, we encourage you to take your nausea medication as directed.  BELOW ARE SYMPTOMS THAT SHOULD BE REPORTED IMMEDIATELY: *FEVER GREATER THAN 100.4 F (38 C) OR HIGHER *CHILLS OR SWEATING *NAUSEA AND VOMITING THAT IS NOT CONTROLLED WITH YOUR NAUSEA MEDICATION *UNUSUAL SHORTNESS OF BREATH *UNUSUAL BRUISING OR BLEEDING *URINARY PROBLEMS (pain or burning when urinating, or frequent urination) *BOWEL PROBLEMS (unusual diarrhea, constipation, pain near the anus) TENDERNESS IN MOUTH AND THROAT WITH OR WITHOUT PRESENCE OF ULCERS (sore throat, sores in mouth, or a toothache) UNUSUAL RASH, SWELLING OR PAIN  UNUSUAL VAGINAL DISCHARGE OR ITCHING   Items with * indicate a potential emergency and should be followed up as soon as possible or go to the Emergency Department if any problems should occur.  Please show the CHEMOTHERAPY ALERT CARD or IMMUNOTHERAPY ALERT CARD at  check-in to the Emergency Department and triage nurse.  Should you have questions after your visit or need to cancel or reschedule your appointment, please contact [facility]  Dept: [phone]  and follow the prompts.  Office hours are 8:00 a.m. to 4:30 p.m. Monday - Friday. Please note that voicemails left after 4:00 p.m. may not be returned until the following business day.  We are closed weekends and major holidays. You have access to a nurse at all times for urgent questions. Please call the main number to the clinic Dept: [phone] and follow the prompts.   For any non-urgent questions, you may also contact your provider using MyChart. We now offer e-Visits for anyone 60 and older to request care online for non-urgent symptoms. For details visit [email or web address].   Also download the MyChart app! Go to the app store, search "MyChart", open the app, select [facility], and log in with your MyChart username and password.  Due to Covid, a mask is required upon entering the hospital/clinic. If you do not have a mask, one will be given to you upon arrival. For doctor visits, patients may have 1 support person aged 54 or older with them. For treatment visits, patients cannot have anyone with them due to current Covid guidelines and our immunocompromised population.

## 2021-07-06 NOTE — Telephone Encounter (Signed)
Called patient to schedule a consultation w. Dr. Moody. No answer, LVM for a return call.  

## 2021-07-06 NOTE — Assessment & Plan Note (Signed)
11/17/2020:Work-up performed at [facility]: Palpable right breast mass. Mammogram and ultrasound 11/10/2020: Spiculated 4.9 cm mass UOQ right breast with microcalcifications, biopsy revealed IDC ER/PR negative, HER2 positive 3+ by IHC, axillary lymph node positive (2 lymph nodes were detected)  Genetics were negative  Treatment Planbased on multidisciplinary tumor board: 1. Neoadjuvant chemotherapy with [place] Perjeta 6 cycles(started 12/29/2020-04/13/2021)followed by Herceptin Perjeta maintenance  for 1 year 2.06/29/2021:Right mastectomy: No residual cancer identified, 0/4 lymph nodes negative, complete pathologic response 3. Followed by adjuvant radiation therapy ------------------------------------------------------------------------------------------------------------------------- Pathology counseling: I discussed the final pathology report of the patient provided  a copy of this report. I discussed the margins as well as lymph node surgeries. We also discussed the final staging along with previously performed ER/PR and HER-2/neu testing.  Continue with Herceptin Perjeta maintenance every 3 weeks.

## 2021-07-07 ENCOUNTER — Telehealth: Payer: Self-pay | Admitting: Hematology and Oncology

## 2021-07-07 ENCOUNTER — Ambulatory Visit (INDEPENDENT_AMBULATORY_CARE_PROVIDER_SITE_OTHER): Payer: Medicare Other | Admitting: Plastic Surgery

## 2021-07-07 DIAGNOSIS — C50411 Malignant neoplasm of upper-outer quadrant of right female breast: Secondary | ICD-10-CM

## 2021-07-07 DIAGNOSIS — Z171 Estrogen receptor negative status [ER-]: Secondary | ICD-10-CM

## 2021-07-07 NOTE — Telephone Encounter (Signed)
Scheduled appointment per 2/14 los. Left message. Patient will be mailed an updated calender.

## 2021-07-07 NOTE — Progress Notes (Signed)
Patient presents 1 week out from right breast reconstruction with tissue expander and acellular dermal matrix.  She feels like things are going well.  Her drains are putting out somewhere between 10 and 30 cc/day.  On exam everything looks to be doing fine.  Her skin looks healthy as far as I can tell.  No subcutaneous fluid.  We will plan to leave the drains in another week and then remove them and we could possibly do a fill next week if everything continues to look good.  All of her questions were answered.

## 2021-07-08 ENCOUNTER — Encounter: Payer: Self-pay | Admitting: Hematology and Oncology

## 2021-07-15 ENCOUNTER — Other Ambulatory Visit: Payer: Self-pay

## 2021-07-15 ENCOUNTER — Ambulatory Visit (INDEPENDENT_AMBULATORY_CARE_PROVIDER_SITE_OTHER): Payer: Medicare Other | Admitting: Plastic Surgery

## 2021-07-15 ENCOUNTER — Encounter: Payer: Self-pay | Admitting: Plastic Surgery

## 2021-07-15 DIAGNOSIS — C50411 Malignant neoplasm of upper-outer quadrant of right female breast: Secondary | ICD-10-CM

## 2021-07-15 DIAGNOSIS — Z171 Estrogen receptor negative status [ER-]: Secondary | ICD-10-CM

## 2021-07-15 NOTE — Progress Notes (Signed)
Patient presents 2 weeks postop from right mastectomy with immediate reconstruction with tissue expander.  She feels like things are going well.  Drains are putting out very little and were removed today.  She currently has 100 cc of saline in her 375 cc expander.  Today we infiltrated 50 cc for a current volume of 150 cc and her 375 cc expander.  She tolerated this fine.  We did remove the Steri-Strips today and overall her skin looks good.  There is 1 to 2 mm of slough and one small area but this looks superficial and do not expect it to cause a problem.  We will plan to see her next week.  We are planning to move along as quickly as we can with the expansion process so that she is ready for radiation.  All her questions were answered.

## 2021-07-21 ENCOUNTER — Other Ambulatory Visit: Payer: Self-pay

## 2021-07-21 ENCOUNTER — Ambulatory Visit: Payer: Medicare Other | Attending: Surgery | Admitting: Rehabilitation

## 2021-07-21 ENCOUNTER — Encounter: Payer: Self-pay | Admitting: Rehabilitation

## 2021-07-21 DIAGNOSIS — Z483 Aftercare following surgery for neoplasm: Secondary | ICD-10-CM | POA: Insufficient documentation

## 2021-07-21 DIAGNOSIS — M6281 Muscle weakness (generalized): Secondary | ICD-10-CM | POA: Insufficient documentation

## 2021-07-21 DIAGNOSIS — R293 Abnormal posture: Secondary | ICD-10-CM | POA: Insufficient documentation

## 2021-07-21 DIAGNOSIS — Z171 Estrogen receptor negative status [ER-]: Secondary | ICD-10-CM | POA: Insufficient documentation

## 2021-07-21 DIAGNOSIS — C50411 Malignant neoplasm of upper-outer quadrant of right female breast: Secondary | ICD-10-CM | POA: Insufficient documentation

## 2021-07-21 NOTE — Patient Instructions (Signed)
Axillary web syndrome (also called cording) can happen after having breast cancer surgery when lymph nodes in the armpit are removed. It presents as if you have a thin cord in your arm and can run from the armpit all the way down into the forearm. If you?ve had a sentinel node biopsy, the risk is 1-20% and if you?ve had an axillary lymph node dissection (more than 7 nodes removed), the risk is 36-72%. The ranges vary depending on the research study.  ?It most often happens 3-4 weeks post-op but can happen sooner or later. There are several possibilities for what cording actually is. Although no one knows for sure as of yet, it may be related to lymphatics, veins, or other tissue. ?Sometimes cording resolves on its own but other times it requires physical therapy with a therapist who specializes in lymphedema and/or cancer rehab. Treatment typically involves stretching, manual techniques, and exercise. Sometimes cords get ?released? while stretching or during manual treatment and the patient may experience the sensation of a ?pop.? This may feel strange but it is not dangerous and is a sign that the cord has released; range of motion may be improved in the process. ? ? ? ? ? [facility] ? [address], [address] ? [place] [address] ? [phone] ? ?Scar massage ?You can begin gentle scar massage to you incision sites. Gently place one hand on the incision and move the skin (without sliding on the skin) in various directions. Do this for a few minutes and then you can gently massage either coconut oil or vitamin E cream into the scars. - at least 6-8 weeks after expander  ? ?Compression garment ?You should continue wearing your compression bra until you feel like you no longer have swelling. ? ?Home exercise Program ?Continue doing the exercises you were given until you feel like you can do them without feeling any tightness at the end.  ? ?Walking Program ?Studies show that 30 minutes of  walking per day (fast enough to elevate your heart rate) can significantly reduce the risk of a cancer recurrence. If you can't walk due to other medical reasons, we encourage you to find another activity you could do (like a stationary bike or water exercise). ? ?Posture ?After breast cancer surgery, people frequently sit with rounded shoulders posture because it puts their incisions on slack and feels better. If you sit like this and scar tissue forms in that position, you can become very tight and have pain sitting or standing with good posture. Try to be aware of your posture and sit and stand up tall to heal properly. ? ?Follow up PT: ?It is recommended you return every 3 months for the first 3 years following surgery to be assessed on the SOZO machine for an L-Dex score. This helps prevent clinically significant lymphedema in 95% of patients. These follow up screens are 10 minute appointments that you are not billed for. - which you already have scheduled  ? ? ? ?

## 2021-07-21 NOTE — Therapy (Signed)
[facility] [place] ?[place] [facility] [place] ?[place], [place], [address] ?Phone: [phone]   Fax:  [phone] ? ?Physical Therapy Treatment ? ?Patient Details  ?Name: Marie Day ?MRN: [id number] ?Date of Birth: Oct 31, 1945 ?Referring Provider (PT): Dr. Brantley Stage ? ? ?Encounter Date: 07/21/2021 ? ? PT End of Session - 07/21/21 1532   ? ? Visit Number 2   ? Number of Visits 8   ? Date for PT Re-Evaluation 09/15/21   ? PT Start Time 1115   ? PT Stop Time 1155   ? PT Time Calculation (min) 40 min   ? Activity Tolerance Patient tolerated treatment well   ? Behavior During Therapy [facility] for tasks assessed/performed   ? ?  ?  ? ?  ? ? ?Past Medical History:  ?Diagnosis Date  ? Arthritis   ? Cancer [facility])   ? ? ?Past Surgical History:  ?Procedure Laterality Date  ? BREAST RECONSTRUCTION WITH PLACEMENT OF TISSUE EXPANDER AND FLEX HD (ACELLULAR HYDRATED DERMIS) Right 06/29/2021  ? Procedure: RIGHT BREAST RECONSTRUCTION WITH PLACEMENT OF TISSUE EXPANDER AND FLEX HD (ACELLULAR HYDRATED DERMIS);  Surgeon: Cindra Presume, MD;  Location: [facility] [place];  Service: Plastics;  Laterality: Right;  ? MASTECTOMY W/ SENTINEL NODE BIOPSY Right 06/29/2021  ? Procedure: RIGHT MASTECTOMY WITH SENTINEL LYMPH NODE BIOPSY;  Surgeon: Erroll Luna, MD;  Location: [facility];  Service: General;  Laterality: Right;  ? WISDOM TOOTH EXTRACTION    ? ? ?There were no vitals filed for this visit. ? ? Subjective Assessment - 07/21/21 1112   ? ? Subjective I am doing okay.  Starting to stretch a bit. Able to sleep in the bed.   ? Pertinent History [name] with expander on 06/29/21 with 0/4 LN positive. Drains removed 07/15/21. Will be having radiation consult 07/27/21  and will continue HP maintenance every 3 weeks.   ? Currently in Pain? No/denies   not at rest - some increased pain in the axilla with reaching  ? ?  ?  ? ?  ? ? ? ? ? [facility] PT Assessment - 07/21/21 0001   ? ?  ? Assessment  ?  Medical Diagnosis [name] cancer   ? Referring Provider (PT) Dr. Brantley Stage   ? Onset Date/Surgical Date 04/29/21   ? Hand Dominance Right   ?  ? Precautions  ? Precaution Comments active cancer   ?  ? Balance Screen  ? Has the patient fallen in the past 6 months No   ? Has the patient had a decrease in activity level because of a fear of falling?  No   ? Is the patient reluctant to leave their home because of a fear of falling?  No   ?  ? Home Environment  ? Living Environment Private residence   ? Living Arrangements Spouse/significant other   ? Available Help at Discharge Family   ?  ? Prior Function  ? Level of Independence Independent   ? Vocation Retired   ? Leisure I like to walk   ?  ? Observation/Other Assessments  ? Observations well healed mastectomy incision some tightness of the incision laterally. expander appears flat due to half way filled. Cording noted in axilla during AROM   ?  ? Posture/Postural Control  ? Posture/Postural Control Postural limitations   ? Postural Limitations Rounded Shoulders;Forward head   ?  ? AROM  ? Right Shoulder Flexion 145 Degrees   with  cording in the axilla  ? Right Shoulder ABduction 140 Degrees   with cording evident  ?  ? Palpation  ? Palpation comment cording palpable in axilla 3 cords and into upper arm   ? ?  ?  ? ?  ? ? ? ? ? ? ? ? Katina Dung - 07/21/21 0001   ? ? Open a tight or new jar Moderate difficulty   ? Do heavy household chores (wash walls, wash floors) Unable   ? Carry a shopping bag or briefcase Moderate difficulty   ? Wash your back Severe difficulty   ? Use a knife to cut food No difficulty   ? Recreational activities in which you take some force or impact through your arm, shoulder, or hand (golf, hammering, tennis) Unable   ? During the past week, to what extent has your arm, shoulder or hand problem interfered with your normal social activities with family, friends, neighbors, or groups? Modererately   ? During the past week, to what extent has  your arm, shoulder or hand problem limited your work or other regular daily activities Quite a bit   ? Arm, shoulder, or hand pain. Moderate   ? Tingling (pins and needles) in your arm, shoulder, or hand Moderate   ? Difficulty Sleeping Moderate difficulty   ? DASH Score 59.09 %   ? ?  ?  ? ?  ? ? ? ? ? ? ? ? ? ? ? ? ? ? ? ? ? ? PT Education - 07/21/21 1540   ? ? Education Details new POC, cording, review of exercises   ? Person(s) Educated Patient   ? Methods Explanation;Demonstration;Tactile cues;Verbal cues   ? Comprehension Verbalized understanding;Returned demonstration   ? ?  ?  ? ?  ? ? ? ? ? ? PT Long Term Goals - 07/21/21 1540   ? ?  ? PT LONG TERM GOAL #1  ? Title Pt will return to baseline AROM   ? Baseline Flex: 160  Abd: 163   ? Time 8   ? Period Weeks   ? Status On-going   ?  ? PT LONG TERM GOAL #2  ? Title Pt will be scheduled for ABC class or risk reduction education   ? Status On-going   ? ?  ?  ? ?  ? ? ? ? ? ? ? ? Plan - 07/21/21 1536   ? ? Clinical Impression Statement Pt returns 3 weeks post [name] with expander placement.  Pt is doing very well but has significant axillary cording in the axilla into the upper arm which is limiting her AROM.  Initiated PT visits to help improve AROM due to cording and prepare pt for radiation.   ? Personal Factors and Comorbidities Age;Comorbidity 3+   ? Comorbidities CIPN, fatigue, chemotherapy   ? Examination-Activity Limitations Reach Overhead;Lift   ? Examination-Participation Restrictions Cleaning;Yard Work;Community Activity   ? Stability/Clinical Decision Making Stable/Uncomplicated   ? PT Frequency 1x / week   ? PT Duration 8 weeks   ? PT Treatment/Interventions ADLs/Self Care Home Management;Therapeutic exercise;Patient/family education;Manual techniques   ? PT Next Visit Plan schedule out SOZO and ABC class or talk in person about risk reduction, Rt PROM/cording release/AAROM - no scar massage or MLD over breast until at least 08/16/21 due to  expander   ? PT Home Exercise Plan post op   ? Consulted and Agree with Plan of Care Patient   ? ?  ?  ? ?  ? ? ?  Patient will benefit from skilled therapeutic intervention in order to improve the following deficits and impairments:  Decreased activity tolerance, Decreased knowledge of precautions, Decreased range of motion, Decreased skin integrity ? ?Visit Diagnosis: ?Malignant neoplasm of upper-outer quadrant of right breast in female, estrogen receptor negative ([place]) - Plan: PT plan of care cert/re-cert ? ?Muscle weakness (generalized) - Plan: PT plan of care cert/re-cert ? ?Abnormal posture - Plan: PT plan of care cert/re-cert ? ?Aftercare following surgery for neoplasm - Plan: PT plan of care cert/re-cert ? ? ? ? ?Problem List ?Patient Active Problem List  ? Diagnosis Date Noted  ? S/P breast reconstruction 06/29/2021  ? Port-A-Cath in place 01/18/2021  ? Malignant neoplasm of upper-outer quadrant of right breast in female, estrogen receptor negative ([place]) 12/21/2020  ? Anemia 10/27/2020  ? Prediabetes 10/27/2020  ? 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  ? Unspecified age-related cataract 12/14/2015  ? ? ?Stark Bray, PT ?07/21/2021, 3:41 PM ? ?[place] ?[place] [facility] [place] ?[place], [place], [address] ?Phone: [phone]   Fax:  [phone] ? ?Name: KENNEDI LIZARDO ?MRN: [id number] ?Date of Birth: 1946/01/16 ? ? ? ?

## 2021-07-22 ENCOUNTER — Ambulatory Visit (INDEPENDENT_AMBULATORY_CARE_PROVIDER_SITE_OTHER): Payer: Medicare Other | Admitting: Plastic Surgery

## 2021-07-22 DIAGNOSIS — C50411 Malignant neoplasm of upper-outer quadrant of right female breast: Secondary | ICD-10-CM

## 2021-07-22 DIAGNOSIS — Z171 Estrogen receptor negative status [ER-]: Secondary | ICD-10-CM

## 2021-07-22 NOTE — Progress Notes (Signed)
Patient presents in follow-up for tissue expander fills.  She did fine after the expansion last week.  She currently has 150 cc in her 375 cc expanders.  Today we infiltrated 100 cc for a current volume of 250 cc in her 375 cc expander.  She tolerated this fine.  The skin looks to be doing fine with no healing issues that I can see.  We will plan to see her next week for another fill.  All of her questions were answered. ?

## 2021-07-26 ENCOUNTER — Other Ambulatory Visit: Payer: Self-pay

## 2021-07-26 ENCOUNTER — Ambulatory Visit: Payer: Medicare Other

## 2021-07-26 ENCOUNTER — Encounter: Payer: Self-pay | Admitting: Hematology and Oncology

## 2021-07-26 DIAGNOSIS — R293 Abnormal posture: Secondary | ICD-10-CM

## 2021-07-26 DIAGNOSIS — M6281 Muscle weakness (generalized): Secondary | ICD-10-CM

## 2021-07-26 DIAGNOSIS — Z171 Estrogen receptor negative status [ER-]: Secondary | ICD-10-CM

## 2021-07-26 DIAGNOSIS — C50411 Malignant neoplasm of upper-outer quadrant of right female breast: Secondary | ICD-10-CM

## 2021-07-26 DIAGNOSIS — Z483 Aftercare following surgery for neoplasm: Secondary | ICD-10-CM

## 2021-07-26 NOTE — Progress Notes (Signed)
New Breast Cancer Diagnosis:Right Breast UOQ ? ?Did patient present with symptoms (if so, please note symptoms) or screening mammography?:Palpable mass noted in June 2022.  ? ? Location and Extent of disease :right breast. Located in the upper outer quadrant, measured 4.9 cm in greatest dimension. Adenopathy yes. ? ? ?MRI 04/14/2021: Findings compatible with interval treatment response.  No mass or abnormal enhancement noted in the left breast. ? ?MRI 12/24/2020: Large area of non mass enhancement in the upper outer right breast measuring 7.0 x 2.2 x 3.4 cm, consistent with biopsy proven malignancy.  There is a similar appearing suspicious area of non mass enhancement in the central breast measuring 2.5 x 0.9 x 1.2 cm.  There is a third area of suspicious linear non mass enhancement in the anterior outer right breast spanning 1.7 cm. ?Two small intramammary lymph nodes in the upper central and upper outer right breast. These are benign appearance though asymmetric compared to the left side and somewhat suspicious given proximity to the patient's known malignancy.  No MRI evidence of malignancy in the left breast. ? ?PET 12/03/2020: Right Breast malignancy SUV 4.6, sub-centimeter right level 1 and 2 axillary lymph nodes.  No distant metastatic disease.  ? ? ?Histology per Pathology Report: grade 3, Invasive Ductal Carcinoma 06/29/2021 ? ?Receptor Status: ER(negative), PR (negative), Her2-neu (positive), Ki-(%) ? ? ?Plastic Surgery plan, if any: ?Dr. Claudia Desanctis 07/22/2021 ?-Today we infiltrated 100 cc for a current volume of 250 cc in her 375 cc expander. ?-We will plan to see her next week for another fill. ?-Tissue expander placement 06/29/2021 ?-Next fill is 07/29/2021 ? ? ?Surgeon and surgical plan, if any: ?Dr. Brantley Stage ?Right Mastectomy and SLN biopsy with tissue expander placement. 06/29/2021  ? ? ?Medical oncologist, treatment if any:   ?Dr. Lindi Adie 07/06/2021 ?Treatment Plan based on multidisciplinary tumor board: ?1. Neoadjuvant  chemotherapy with [place] Perjeta ?6 cycles (started 12/29/2020-04/13/2021) followed by Herceptin Perjeta maintenance  for 1 year ?2. 06/29/2021:Right mastectomy: No residual cancer identified, 0/4 lymph nodes negative, complete pathologic response ?3. Followed by adjuvant radiation therapy ? ? ?Family History of Breast/Ovarian/Prostate Cancer: none ? ?Lymphedema issues, if any: Notes some cording under her arm, she has seen  PT, Mateo Flow.   ? ?Pain issues, if any:  Occasional aches.   ? ?SAFETY ISSUES: ?Prior radiation? No ?Pacemaker/ICD? No ?Possible current pregnancy?  Postmenopausal ?Is the patient on methotrexate? No ? ?Current Complaints / other details:   ? ?

## 2021-07-26 NOTE — Progress Notes (Signed)
Radiation Oncology         ([phone]) [phone] ________________________________  Name: Marie Day        MRN: [id number]  Date of Service: 07/27/2021 DOB: December 07, 1945  CC:Pcp, No  Nicholas Lose, MD     REFERRING PHYSICIAN: Nicholas Lose, MD   DIAGNOSIS: The encounter diagnosis was Malignant neoplasm of upper-outer quadrant of right breast in female, estrogen receptor negative ([place]).   HISTORY OF PRESENT ILLNESS: Marie Day is a 76 y.o. female seen at the request of Dr. Lindi Adie for a diagnosis of right breast cancer.  The patient was originally diagnosed at [facility] with a palpable right breast mass in June 2022.  Imaging at that time showed a 4.9 cm spiculated mass in the upper outer quadrant of the right breast with microcalcifications, and a biopsy showed ER/PR negative, HER2 amplified invasive ductal carcinoma, apparently 2 axillary lymph nodes were abnormal and biopsy also showed metastatic disease.  She underwent a PET scan that showed her known breast disease with hypermetabolic level 1 and 2 axillary lymph nodes.  No metastatic findings were noted. She then relocated to [facility] and established with Dr. Lindi Adie on 12/21/20. A Pre-Neo chemotherapy MRI on 12/24/2020 showed her known breast cancer with associated non-mass enhancement in total measuring up to 7 cm, a suspicious area of non-mass enhancement in the central breast measuring 2.5 cm and 2 small intramammary lymph nodes in the upper central and upper outer right breast.  She was started on neoadjuvant chemotherapy on 12/29/2020, and completed her treatment on 04/13/2021.  She then underwent MRI on 04/14/2021 which showed interval treatment response with no abnormal appearing lymph nodes, susceptibility artifact in the right breast at prior biopsy sites were noted but no evidence of gross disease.  She was taken for mastectomy with sentinel lymph node biopsy and tissue expander placement on 06/29/2021 right breast tissue showed  fibrosis and mild inflammation consistent with treatment effect, a lymph node in the specimen was negative for disease.  3 additional axillary lymph nodes were negative for disease and skin flap where there was a superior nodule was negative for disease.  She is in the midst of expansion with Dr. Claudia Desanctis.  She is seen today to discuss adjuvant radiotherapy to the right breast and regional lymph nodes.   PREVIOUS RADIATION THERAPY: No   PAST MEDICAL HISTORY:  Past Medical History:  Diagnosis Date   Arthritis    Cancer ([place])        PAST SURGICAL HISTORY: Past Surgical History:  Procedure Laterality Date   BREAST RECONSTRUCTION WITH PLACEMENT OF TISSUE EXPANDER AND FLEX HD (ACELLULAR HYDRATED DERMIS) Right 06/29/2021   Procedure: RIGHT BREAST RECONSTRUCTION WITH PLACEMENT OF TISSUE EXPANDER AND FLEX HD (ACELLULAR HYDRATED DERMIS);  Surgeon: Cindra Presume, MD;  Location: [facility];  Service: Plastics;  Laterality: Right;   MASTECTOMY W/ SENTINEL NODE BIOPSY Right 06/29/2021   Procedure: RIGHT MASTECTOMY WITH SENTINEL LYMPH NODE BIOPSY;  Surgeon: Erroll Luna, MD;  Location: [facility];  Service: General;  Laterality: Right;   WISDOM TOOTH EXTRACTION       FAMILY HISTORY: No family history on file.   SOCIAL HISTORY:  reports that she has never smoked. She has never used smokeless tobacco. She reports that she does not drink alcohol and does not use drugs. The patient is married and lives in [place]. She is accompanied by her husband.   ALLERGIES: Sulfa antibiotics   MEDICATIONS:  Current Outpatient Medications  Medication Sig Dispense Refill   Acetaminophen (TYLENOL 8 HOUR ARTHRITIS PAIN PO) Take by mouth.     lidocaine-prilocaine (EMLA) cream Apply to affected area once 30 g 3   ondansetron (ZOFRAN) 8 MG tablet Take 1 tablet (8 mg total) by mouth 2 (two) times daily as needed (Nausea or vomiting). Start on the third day after chemotherapy. 30 tablet  1   prochlorperazine (COMPAZINE) 10 MG tablet TAKE 1 TABLET (10 MG TOTAL) BY MOUTH EVERY 6 (SIX) HOURS AS NEEDED (NAUSEA OR VOMITING). 30 tablet 1   No current facility-administered medications for this visit.     REVIEW OF SYSTEMS: On review of systems, the patient reports that she is doing well overall. She is starting to see hair regrowth and improvement of her chemo related side effects.  No other complaints are noted.     PHYSICAL EXAM:  Wt Readings from Last 3 Encounters:  07/06/21 144 lb 11.2 oz (65.6 kg)  06/29/21 147 lb 4.3 oz (66.8 kg)  06/16/21 149 lb (67.6 kg)   Temp Readings from Last 3 Encounters:  07/06/21 (!) 97.2 F (36.2 C) (Temporal)  06/29/21 99.1 F (37.3 C)  06/15/21 98.4 F (36.9 C) (Oral)   BP Readings from Last 3 Encounters:  07/06/21 114/75  06/29/21 101/60  06/16/21 123/73   Pulse Readings from Last 3 Encounters:  07/06/21 98  06/30/21 78  06/16/21 70    In general this is a well appearing caucasian female in no acute distress. She's alert and oriented x4 and appropriate throughout the examination. Cardiopulmonary assessment is negative for acute distress and she exhibits normal effort. Breast exam is deferred to simulation    ECOG = 1  0 - Asymptomatic (Fully active, able to carry on all predisease activities without restriction)  1 - Symptomatic but completely ambulatory (Restricted in physically strenuous activity but ambulatory and able to carry out work of a light or sedentary nature. For example, light housework, office work)  2 - Symptomatic, <50% in bed during the day (Ambulatory and capable of all self care but unable to carry out any work activities. Up and about more than 50% of waking hours)  3 - Symptomatic, >50% in bed, but not bedbound (Capable of only limited self-care, confined to bed or chair 50% or more of waking hours)  4 - Bedbound (Completely disabled. Cannot carry on any self-care. Totally confined to bed or  chair)  5 - Death   Eustace Pen MM, Creech RH, Tormey DC, et al. [phone]). "Toxicity and response criteria of the [facility]". [place] Oncol. 5 (6): 649-55    LABORATORY DATA:  Lab Results  Component Value Date   WBC 8.0 07/06/2021   HGB 10.3 (L) 07/06/2021   HCT 32.2 (L) 07/06/2021   MCV 90.4 07/06/2021   PLT 339 07/06/2021   Lab Results  Component Value Date   NA 139 07/06/2021   K 3.4 (L) 07/06/2021   CL 106 07/06/2021   CO2 27 07/06/2021   Lab Results  Component Value Date   ALT 14 07/06/2021   AST 17 07/06/2021   ALKPHOS 90 07/06/2021   BILITOT 0.2 (L) 07/06/2021      RADIOGRAPHY: No results found.     IMPRESSION/PLAN: 1. 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 Dr. Lisbeth Renshaw discusses the pathology findings and reviews the nature of right node positive breast disease. She has done well since chemotherapy and surgery, and is  still in the expansion process. Dr. Lisbeth Renshaw discusses the rationale after completing expansion to proceed with external radiotherapy to the chest wall and regional nodes  to reduce risks of local recurrence followed by antiestrogen therapy. We discussed the risks, benefits, short, and long term effects of radiotherapy, as well as the curative intent, and the patient is interested in proceeding. Dr. Lisbeth Renshaw discusses the delivery and logistics of radiotherapy and anticipates a course of 6 1/2 weeks of radiotherapy to the right chest wall and regional nodes. She is aware of the recommendation to delay reconstruction following radiation. Written consent is obtained and placed in the chart, a copy was provided to the patient. She will be contacted to coordinate simulation after completing her expansion.   In a visit lasting 60 minutes, greater than 50% of the time was spent face to face reviewing her case, as well as in preparation of, discussing, and coordinating the patient's care.  The  above documentation reflects my direct findings during this shared patient visit. Please see the separate note by Dr. Lisbeth Renshaw on this date for the remainder of the patient's plan of care.    [name], [facility]    **Disclaimer: This note was dictated with voice recognition software. Similar sounding words can inadvertently be transcribed and this note may contain transcription errors which may not have been corrected upon publication of note.**

## 2021-07-26 NOTE — Therapy (Signed)
[place] ?[place] @ [place] ?[place], [place], [address] ?Phone: [phone]   Fax:  [phone] ? ?Physical Therapy Treatment ? ?Patient Details  ?Name: Marie Day ?MRN: [id number] ?Date of Birth: 1945-12-31 ?Referring Provider (PT): Dr. Brantley Stage ? ? ?Encounter Date: 07/26/2021 ? ? PT End of Session - 07/26/21 1511   ? ? Visit Number 3   ? Number of Visits 8   ? Date for PT Re-Evaluation 09/15/21   ? PT Start Time 6629   ? PT Stop Time 4765   ? PT Time Calculation (min) 58 min   ? Activity Tolerance Patient tolerated treatment well   ? Behavior During Therapy [facility] for tasks assessed/performed   ? ?  ?  ? ?  ? ? ?Past Medical History:  ?Diagnosis Date  ? Arthritis   ? Cancer [facility])   ? ? ?Past Surgical History:  ?Procedure Laterality Date  ? BREAST RECONSTRUCTION WITH PLACEMENT OF TISSUE EXPANDER AND FLEX HD (ACELLULAR HYDRATED DERMIS) Right 06/29/2021  ? Procedure: RIGHT BREAST RECONSTRUCTION WITH PLACEMENT OF TISSUE EXPANDER AND FLEX HD (ACELLULAR HYDRATED DERMIS);  Surgeon: Cindra Presume, MD;  Location: [facility];  Service: Plastics;  Laterality: Right;  ? MASTECTOMY W/ SENTINEL NODE BIOPSY Right 06/29/2021  ? Procedure: RIGHT MASTECTOMY WITH SENTINEL LYMPH NODE BIOPSY;  Surgeon: Erroll Luna, MD;  Location: [place] [facility] [place];  Service: General;  Laterality: Right;  ? WISDOM TOOTH EXTRACTION    ? ? ?There were no vitals filed for this visit. ? ? Subjective Assessment - 07/26/21 1509   ? ? Subjective I was stretching yesterday and felt a pop from the cording.   ? Pertinent History [name] with expander on 06/29/21 with 0/4 LN positive. Drains removed 07/15/21. Will be having radiation consult 07/27/21  and will continue HP maintenance every 3 weeks.   ? Patient Stated Goals get information from providers   ? Currently in Pain? No/denies   ? ?  ?  ? ?  ? ? ? ? ? ? ? ? ? ? ? ? ? ? ? ? ? ? ? ? [facility] PT Treatment/Exercise - 07/26/21  0001   ? ?  ? Manual Therapy  ? Manual Therapy Myofascial release;Passive ROM;Scapular mobilization   ? Myofascial Release To Rt axilla at area of cording   ? Scapular Mobilization With cocoa butter with pt in Lt S/L to Rt scapula into protraction and retraction, very limited mobility initially but this did improve some after mobs   ? Passive ROM In Supine: To Rt shoulder into flexion, abduction and D2 to pts tolerance   ? ?  ?  ? ?  ? ? ? ? ? ? ? ? ? ? ? ? ? ? ? PT Long Term Goals - 07/21/21 1540   ? ?  ? PT LONG TERM GOAL #1  ? Title Pt will return to baseline AROM   ? Baseline Flex: 160  Abd: 163   ? Time 8   ? Period Weeks   ? Status On-going   ?  ? PT LONG TERM GOAL #2  ? Title Pt will be scheduled for ABC class or risk reduction education   ? Status On-going   ? ?  ?  ? ?  ? ? ? ? ? ? ? ? Plan - 07/26/21 1605   ? ? Clinical Impression Statement First sessoin today of manual therapy. MFR to cording in Rt  axilla and into upper medial arm. Also included scapular mobs. Pt very tight here and limited mobility noted but this improved after scap mobs as did her Rt shoulder P/ROM. Encouraged pt to cont stretching as able to better ready her for radiation positioning.   ? Personal Factors and Comorbidities Age;Comorbidity 3+   ? Comorbidities CIPN, fatigue, chemotherapy   ? Examination-Activity Limitations Reach Overhead;Lift   ? Examination-Participation Restrictions Cleaning;Yard Work;Community Activity   ? Stability/Clinical Decision Making Stable/Uncomplicated   ? Rehab Potential Excellent   ? PT Frequency 1x / week   ? PT Duration 8 weeks   ? PT Treatment/Interventions ADLs/Self Care Home Management;Therapeutic exercise;Patient/family education;Manual techniques   ? PT Next Visit Plan schedule out SOZO and ABC class or talk in person about risk reduction, Rt PROM/cording release/AAROM - no scar massage or MLD over breast until at least 08/16/21 due to expander   ? PT Home Exercise Plan post op   ? Consulted and  Agree with Plan of Care Patient   ? ?  ?  ? ?  ? ? ?Patient will benefit from skilled therapeutic intervention in order to improve the following deficits and impairments:  Decreased activity tolerance, Decreased knowledge of precautions, Decreased range of motion, Decreased skin integrity ? ?Visit Diagnosis: ?Malignant neoplasm of upper-outer quadrant of right breast in female, estrogen receptor negative ([place]) ? ?Muscle weakness (generalized) ? ?Abnormal posture ? ?Aftercare following surgery for neoplasm ? ? ? ? ?Problem List ?Patient Active Problem List  ? Diagnosis Date Noted  ? S/P breast reconstruction 06/29/2021  ? Port-A-Cath in place 01/18/2021  ? Malignant neoplasm of upper-outer quadrant of right breast in female, estrogen receptor negative ([place]) 12/21/2020  ? Anemia 10/27/2020  ? Prediabetes 10/27/2020  ? 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  ? Unspecified age-related cataract 12/14/2015  ? ? ?Otelia Limes, PTA ?07/26/2021, 4:08 PM ? ?[place] ?[place] @ [place] ?[place], [place], [address] ?Phone: [phone]   Fax:  [phone] ? ?Name: Marie Day ?MRN: [id number] ?Date of Birth: February 08, 1946 ? ? ? ?

## 2021-07-26 NOTE — Progress Notes (Signed)
Patient is a 76 year old female with PMH of right-sided breast cancer s/p right-sided mastectomy with immediate reconstruction performed 06/29/2021 by Dr. Claudia Desanctis who presents to clinic for postoperative follow-up. ? ?Patient was last seen here in clinic on 07/22/2021 by Dr. Claudia Desanctis.  At that time, 100 cc normal saline was injected into each expander for a total of 250/375 cc bilaterally.  Exam was entirely reassuring. ? ?Today, patient is doing well.  Patient reports that she has lost approximately 20 pounds in the past several months.  She is seeing PT for cords in her right arm as well as to help with localized lymphedema in right axillary region.  She denies any right-sided breast pain.  She does however take oxycodone at night to help her sleep.  She reports that she has been having difficulty staying asleep for 30+ years.  She has found that the oxycodone has allowed her to sleep well at night.  She tells me that her sleep difficulties are not related to pain, but that she has tried over-the-counter therapies without any success.  Patient also reports that her arthritis has unfortunately returned. ? ?Physical exam is reassuring.  Right breast without any erythema or evidence of wound dehiscence.  No obvious subcutaneous fluid collection appreciated. ? ?We placed injectable saline in the Expander using a sterile technique: ?Right: 70 cc for a total of 320 / 375 cc ? ?Patient reports that she has patient states that she saw RT who told her that she would need to be fully expanded before they would begin radiation therapy.  I informed her that she likely will require only a single additional expander fill.  She also tells me that she would like to have a left-sided mastopexy at time of implant exchange if possible.  Sounds like implant exchange may be delayed as she would like to have her RT completed as soon as possible.   ? ?We will prescribe patient hydroxyzine for her to take at night to see if that would help with her  difficulty sleeping.  She understands that it can make her drowsy and prone to falls.  Discussed why I would prefer not to prescribe additional narcotic medications and she voiced understanding.  I also encouraged her to establish with a primary care provider for ongoing sleep management.  Return in 10 days for likely final fill.  Picture(s) obtained of the patient and placed in the chart were with the patient's or guardian's permission.   ?

## 2021-07-27 ENCOUNTER — Encounter: Payer: Self-pay | Admitting: Radiation Oncology

## 2021-07-27 ENCOUNTER — Inpatient Hospital Stay: Payer: Medicare Other

## 2021-07-27 ENCOUNTER — Ambulatory Visit
Admission: RE | Admit: 2021-07-27 | Discharge: 2021-07-27 | Disposition: A | Discharge status: Home / Self Care | Payer: Medicare Other | Source: Ambulatory Visit | Attending: Radiation Oncology | Admitting: Radiation Oncology

## 2021-07-27 ENCOUNTER — Inpatient Hospital Stay: Payer: Medicare Other | Attending: Hematology and Oncology

## 2021-07-27 VITALS — BP 117/61 | HR 68 | Temp 97.7°F | Resp 18 | Ht 65.5 in | Wt 144.0 lb

## 2021-07-27 VITALS — BP 127/63 | HR 67 | Temp 97.8°F | Resp 18

## 2021-07-27 DIAGNOSIS — M797 Fibromyalgia: Secondary | ICD-10-CM | POA: Diagnosis not present

## 2021-07-27 DIAGNOSIS — Z9011 Acquired absence of right breast and nipple: Secondary | ICD-10-CM | POA: Insufficient documentation

## 2021-07-27 DIAGNOSIS — Z5112 Encounter for antineoplastic immunotherapy: Secondary | ICD-10-CM | POA: Insufficient documentation

## 2021-07-27 DIAGNOSIS — Z923 Personal history of irradiation: Secondary | ICD-10-CM | POA: Insufficient documentation

## 2021-07-27 DIAGNOSIS — M129 Arthropathy, unspecified: Secondary | ICD-10-CM | POA: Diagnosis not present

## 2021-07-27 DIAGNOSIS — C50412 Malignant neoplasm of upper-outer quadrant of left female breast: Secondary | ICD-10-CM | POA: Diagnosis not present

## 2021-07-27 DIAGNOSIS — Z9221 Personal history of antineoplastic chemotherapy: Secondary | ICD-10-CM | POA: Insufficient documentation

## 2021-07-27 DIAGNOSIS — C50411 Malignant neoplasm of upper-outer quadrant of right female breast: Secondary | ICD-10-CM | POA: Insufficient documentation

## 2021-07-27 DIAGNOSIS — C773 Secondary and unspecified malignant neoplasm of axilla and upper limb lymph nodes: Secondary | ICD-10-CM | POA: Diagnosis not present

## 2021-07-27 DIAGNOSIS — Z79899 Other long term (current) drug therapy: Secondary | ICD-10-CM | POA: Diagnosis not present

## 2021-07-27 DIAGNOSIS — Z171 Estrogen receptor negative status [ER-]: Secondary | ICD-10-CM | POA: Insufficient documentation

## 2021-07-27 DIAGNOSIS — G629 Polyneuropathy, unspecified: Secondary | ICD-10-CM | POA: Insufficient documentation

## 2021-07-27 MED ORDER — SODIUM CHLORIDE 0.9% FLUSH
10.0000 mL | INTRAVENOUS | Status: DC | PRN
  Administered 2021-07-27: 10 mL

## 2021-07-27 MED ORDER — ACETAMINOPHEN 325 MG PO TABS
650.0000 mg | ORAL_TABLET | Freq: Once | ORAL | Status: AC
  Administered 2021-07-27: 650 mg via ORAL
  Filled 2021-07-27: qty 2

## 2021-07-27 MED ORDER — TRASTUZUMAB-DKST CHEMO 150 MG IV SOLR
400.0000 mg | Freq: Once | INTRAVENOUS | Status: AC
  Administered 2021-07-27: 400 mg via INTRAVENOUS
  Filled 2021-07-27: qty 19.05

## 2021-07-27 MED ORDER — DIPHENHYDRAMINE HCL 25 MG PO CAPS
50.0000 mg | ORAL_CAPSULE | Freq: Once | ORAL | Status: AC
  Administered 2021-07-27: 50 mg via ORAL
  Filled 2021-07-27: qty 2

## 2021-07-27 MED ORDER — SODIUM CHLORIDE 0.9 % IV SOLN
420.0000 mg | Freq: Once | INTRAVENOUS | Status: AC
  Administered 2021-07-27: 420 mg via INTRAVENOUS
  Filled 2021-07-27: qty 14

## 2021-07-27 MED ORDER — SODIUM CHLORIDE 0.9 % IV SOLN: Freq: Once | INTRAVENOUS | Status: AC

## 2021-07-27 MED ORDER — HEPARIN SOD (PORK) LOCK FLUSH 100 UNIT/ML IV SOLN
500.0000 [IU] | Freq: Once | INTRAVENOUS | Status: AC | PRN
  Administered 2021-07-27: 500 [IU]

## 2021-07-27 NOTE — Progress Notes (Signed)
Marie Adie MD says ok to proceed without labs for Herceptin/Perjeta today. ?

## 2021-07-27 NOTE — Addendum Note (Signed)
Encounter addended by: Cori Razor, RN on: 07/27/2021 10:20 AM ? Actions taken: Flowsheet accepted

## 2021-07-28 ENCOUNTER — Other Ambulatory Visit: Payer: Self-pay

## 2021-07-28 ENCOUNTER — Ambulatory Visit: Payer: Medicare Other

## 2021-07-28 DIAGNOSIS — Z171 Estrogen receptor negative status [ER-]: Secondary | ICD-10-CM

## 2021-07-28 DIAGNOSIS — C50411 Malignant neoplasm of upper-outer quadrant of right female breast: Secondary | ICD-10-CM

## 2021-07-28 DIAGNOSIS — R293 Abnormal posture: Secondary | ICD-10-CM

## 2021-07-28 DIAGNOSIS — Z483 Aftercare following surgery for neoplasm: Secondary | ICD-10-CM

## 2021-07-28 DIAGNOSIS — M6281 Muscle weakness (generalized): Secondary | ICD-10-CM

## 2021-07-28 NOTE — Therapy (Signed)
[facility] ?[place], [place], [address] ?Phone: [phone]   Fax:  [phone] ? ?Physical Therapy Treatment ? ?Patient Details  ?Name: Marie Day ?MRN: [id number] ?Date of Birth: 08/26/1945 ?Referring Provider (PT): Dr. Brantley Stage ? ? ?Encounter Date: 07/28/2021 ? ? PT End of Session - 07/28/21 1226   ? ? Visit Number 4   ? Number of Visits 8   ? Date for PT Re-Evaluation 09/15/21   ? PT Start Time 1005   ? PT Stop Time 1102   ? PT Time Calculation (min) 57 min   ? Activity Tolerance Patient tolerated treatment well   ? Behavior During Therapy [facility] for tasks assessed/performed   ? ?  ?  ? ?  ? ? ?Past Medical History:  ?Diagnosis Date  ? Arthritis   ? Cancer [facility])   ? ? ?Past Surgical History:  ?Procedure Laterality Date  ? BREAST RECONSTRUCTION WITH PLACEMENT OF TISSUE EXPANDER AND FLEX HD (ACELLULAR HYDRATED DERMIS) Right 06/29/2021  ? Procedure: RIGHT BREAST RECONSTRUCTION WITH PLACEMENT OF TISSUE EXPANDER AND FLEX HD (ACELLULAR HYDRATED DERMIS);  Surgeon: Cindra Presume, MD;  Location: [facility];  Service: Plastics;  Laterality: Right;  ? MASTECTOMY W/ SENTINEL NODE BIOPSY Right 06/29/2021  ? Procedure: RIGHT MASTECTOMY WITH SENTINEL LYMPH NODE BIOPSY;  Surgeon: Erroll Luna, MD;  Location: [facility];  Service: General;  Laterality: Right;  ? WISDOM TOOTH EXTRACTION    ? ? ?There were no vitals filed for this visit. ? ? Subjective Assessment - 07/28/21 1007   ? ? Subjective I felt really good after last session. I felt better than I thought I would and alot looser.   ? Pertinent History [name] with expander on 06/29/21 with 0/4 LN positive. Drains removed 07/15/21. Will be having radiation consult 07/27/21  and will continue HP maintenance every 3 weeks.   ? Patient Stated Goals get information from providers   ? Currently in Pain? No/denies   ? ?  ?  ? ?  ? ? ? ? ? ? ? ? ? ? ? ? ? ? ? ? ? ? ? ? [facility]  Treatment/Exercise - 07/28/21 0001   ? ?  ? Manual Therapy  ? Manual Therapy Myofascial release;Passive ROM;Scapular mobilization;Manual Lymphatic Drainage (MLD)   ? Myofascial Release To Rt axilla at area of cording   ? Scapular Mobilization With cocoa butter with pt in Lt S/L to Rt scapula into protraction and retraction, very limited mobility initially but this did improve some after mobs   ? Manual Lymphatic Drainage (MLD) In Supine: 5 diaphragmatic breaths, Rt inguinal nodes and Rt axillo-inguinal anastomosis focusing on area of fullness/edema at lateral aspect of mastectomy incision   ? Passive ROM In Supine: To Rt shoulder into flexion, abduction and D2 to pts tolerance   ? ?  ?  ? ?  ? ? ? ? ? ? ? ? ? ? ? ? ? ? ? PT Long Term Goals - 07/21/21 1540   ? ?  ? PT LONG TERM GOAL #1  ? Title Pt will return to baseline AROM   ? Baseline Flex: 160  Abd: 163   ? Time 8   ? Period Weeks   ? Status On-going   ?  ? PT LONG TERM GOAL #2  ? Title Pt will be scheduled for ABC class or risk reduction education   ? Status On-going   ? ?  ?  ? ?  ? ? ? ? ? ? ? ?  Plan - 07/28/21 1228   ? ? Clinical Impression Statement Pt returns reporting feelig looser from last session and much better than she had anticipated. Continued with manaul therapy focusing on decreasing muscular tightness at Rt ant/post upper quadrant. Also included brief MLD to Rt lateral incision where tissue palpably tight from edema. Pt conts to report improvement noted at end of session by feeling looser and her end Rt shoulder P/ROM is improving as well.   ? Personal Factors and Comorbidities Age;Comorbidity 3+   ? Comorbidities CIPN, fatigue, chemotherapy   ? Examination-Activity Limitations Reach Overhead;Lift   ? Examination-Participation Restrictions Cleaning;Yard Work;Community Activity   ? Stability/Clinical Decision Making Stable/Uncomplicated   ? Rehab Potential Excellent   ? PT Frequency 1x / week   ? PT Duration 8 weeks   ? PT Treatment/Interventions  ADLs/Self Care Home Management;Therapeutic exercise;Patient/family education;Manual techniques   ? PT Next Visit Plan Cont Rt PROM/cording release/AAROM - no scar massage or MLD over breast (cont MLD to lateral trunk avoiding breast where palpable fullness present) until at least 08/16/21 due to expander; consider progressing to supine scapular series in next 1-2 sessions (yellow theraband and 5 reps only)   ? PT Home Exercise Plan post op   ? Consulted and Agree with Plan of Care Patient   ? ?  ?  ? ?  ? ? ?Patient will benefit from skilled therapeutic intervention in order to improve the following deficits and impairments:  Decreased activity tolerance, Decreased knowledge of precautions, Decreased range of motion, Decreased skin integrity ? ?Visit Diagnosis: ?Malignant neoplasm of upper-outer quadrant of right breast in female, estrogen receptor negative ([place]) ? ?Muscle weakness (generalized) ? ?Abnormal posture ? ?Aftercare following surgery for neoplasm ? ? ? ? ?Problem List ?Patient Active Problem List  ? Diagnosis Date Noted  ? S/P breast reconstruction 06/29/2021  ? Port-A-Cath in place 01/18/2021  ? Malignant neoplasm of upper-outer quadrant of right breast in female, estrogen receptor negative ([place]) 12/21/2020  ? Anemia 10/27/2020  ? Prediabetes 10/27/2020  ? 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  ? Unspecified age-related cataract 12/14/2015  ? ? ?Otelia Limes, PTA ?07/28/2021, 12:31 PM ? ?[facility] ?[facility] ?[place], [place], [address] ?Phone: [phone]   Fax:  [phone] ? ?Name: Marie Day ?MRN: [id number] ?Date of Birth: 05/25/1945 ? ? ? ?

## 2021-07-29 ENCOUNTER — Ambulatory Visit (INDEPENDENT_AMBULATORY_CARE_PROVIDER_SITE_OTHER): Payer: Medicare Other | Admitting: Physician Assistant

## 2021-07-29 DIAGNOSIS — Z171 Estrogen receptor negative status [ER-]: Secondary | ICD-10-CM

## 2021-07-29 DIAGNOSIS — C50411 Malignant neoplasm of upper-outer quadrant of right female breast: Secondary | ICD-10-CM

## 2021-07-29 MED ORDER — HYDROXYZINE PAMOATE 25 MG PO CAPS: 25.0000 mg | ORAL_CAPSULE | Freq: Every evening | ORAL | 0 refills | Status: DC | PRN

## 2021-08-02 ENCOUNTER — Other Ambulatory Visit: Payer: Self-pay

## 2021-08-02 ENCOUNTER — Ambulatory Visit: Payer: Medicare Other | Admitting: Rehabilitation

## 2021-08-02 ENCOUNTER — Encounter: Payer: Self-pay | Admitting: Rehabilitation

## 2021-08-02 DIAGNOSIS — M6281 Muscle weakness (generalized): Secondary | ICD-10-CM

## 2021-08-02 DIAGNOSIS — R293 Abnormal posture: Secondary | ICD-10-CM

## 2021-08-02 DIAGNOSIS — Z171 Estrogen receptor negative status [ER-]: Secondary | ICD-10-CM

## 2021-08-02 DIAGNOSIS — Z483 Aftercare following surgery for neoplasm: Secondary | ICD-10-CM

## 2021-08-02 DIAGNOSIS — C50411 Malignant neoplasm of upper-outer quadrant of right female breast: Secondary | ICD-10-CM

## 2021-08-02 NOTE — Therapy (Signed)
?OUTPATIENT PHYSICAL THERAPY TREATMENT NOTE ? ? ?Patient Name: Marie Day ?MRN: [id number] ?DOB:Oct 29, 1945, 76 y.o., female ?Today's Date: 08/02/2021 ? ?PCP: Pcp, No ?REFERRING PROVIDER: Erroll Luna, MD ? ? PT End of Session - 08/02/21 1002   ? ? Visit Number 5   ? Number of Visits 8   ? Date for PT Re-Evaluation 09/15/21   ? PT Start Time 1005   ? PT Stop Time 1052   ? PT Time Calculation (min) 47 min   ? Activity Tolerance Patient tolerated treatment well   ? Behavior During Therapy [facility] for tasks assessed/performed   ? ?  ?  ? ?  ? ? ?Past Medical History:  ?Diagnosis Date  ? Arthritis   ? Cancer [facility])   ? ?Past Surgical History:  ?Procedure Laterality Date  ? BREAST RECONSTRUCTION WITH PLACEMENT OF TISSUE EXPANDER AND FLEX HD (ACELLULAR HYDRATED DERMIS) Right 06/29/2021  ? Procedure: RIGHT BREAST RECONSTRUCTION WITH PLACEMENT OF TISSUE EXPANDER AND FLEX HD (ACELLULAR HYDRATED DERMIS);  Surgeon: Cindra Presume, MD;  Location: [facility];  Service: Plastics;  Laterality: Right;  ? MASTECTOMY W/ SENTINEL NODE BIOPSY Right 06/29/2021  ? Procedure: RIGHT MASTECTOMY WITH SENTINEL LYMPH NODE BIOPSY;  Surgeon: Erroll Luna, MD;  Location: [facility];  Service: General;  Laterality: Right;  ? WISDOM TOOTH EXTRACTION    ? ?Patient Active Problem List  ? Diagnosis Date Noted  ? S/P breast reconstruction 06/29/2021  ? Port-A-Cath in place 01/18/2021  ? Malignant neoplasm of upper-outer quadrant of right breast in female, estrogen receptor negative ([place]) 12/21/2020  ? Anemia 10/27/2020  ? Prediabetes 10/27/2020  ? 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  ? Unspecified age-related cataract 12/14/2015  ? ? ?REFERRING DIAG: [name] cancer ? ?THERAPY DIAG:  ?Malignant neoplasm of upper-outer quadrant of right breast in female, estrogen receptor negative ([place]) ? ?Muscle weakness (generalized) ? ?Abnormal posture ? ?Aftercare following surgery for neoplasm ? ?PERTINENT HISTORY:   ? [name] with expander on 06/29/21 with 0/4 LN positive. Drains removed 07/15/21. Will be having radiation consult 07/27/21  and will continue HP maintenance every 3 weeks.   ? ? ?PRECAUTIONS: Rt risk of lymphedema; expanders in place  ? ?SUBJECTIVE: Things are going very well.  I am having some OA come back from after chemotherapy.  I will be getting one last fill and then starting radiation.   ? ?PAIN:  ?Are you having pain? Yes ?I feel stiff everywhere  ?No pain at rest, it is with sitting and getting up after sitting for awhile 5/10 ?Walking improves the pain ? ? ?TODAY'S TREATMENT:  ? Manual Therapy  ?     ?  Myofascial Release To Rt axilla at area of cording in various positions  ?     ?  Manual Lymphatic Drainage (MLD) In Supine: 5 diaphragmatic breaths, Rt inguinal nodes and Rt axillo-inguinal anastomosis focusing on area of fullness/edema at lateral aspect of mastectomy incision   ?  Passive ROM In Supine: To Rt shoulder into flexion, abduction and D2 to pts tolerance   ?Exercise:  ?[place] Flexion 71mn with initial instruction ? ?Rt shoulder AROM: ? Flexion: 145 (160 baseline) ? Abduction: 158 (163 baseline) ? ? ?HOME EXERCISE PROGRAM: ?Post op exercises  ? ? ?  Plan - 07/28/21 1228   ?  ?  Clinical Impression Statement Pt is making excellent improvements in ROM but still has significant cording in the axilla and upper arm.  Pt will start radiation after last fill in the next few weeks.    ?  Personal Factors and Comorbidities Age;Comorbidity 3+   ?  Comorbidities CIPN, fatigue, chemotherapy   ?  Examination-Activity Limitations Reach Overhead;Lift   ?  Examination-Participation Restrictions Cleaning;Yard Work;Community Activity   ?  Stability/Clinical Decision Making Stable/Uncomplicated   ?  Rehab Potential Excellent   ?  PT Frequency 1x / week   ?  PT Duration 8 weeks   ?  PT Treatment/Interventions ADLs/Self Care Home Management;Therapeutic exercise;Patient/family education;Manual techniques   ?  PT  Next Visit Plan Cont Rt PROM/cording release/AAROM - no scar massage or MLD over breast (cont MLD to lateral trunk avoiding breast where palpable fullness present) until at least 08/16/21 due to expander; consider progressing to supine scapular series in next 1-2 sessions (yellow theraband and 5 reps only)   ?  PT Home Exercise Plan post op   ?  Consulted and Agree with Plan of Care Patient   ?  ?   ?  ?  ? ? PT Long Term Goals - 07/21/21 1540   ? ?  ? PT LONG TERM GOAL #1  ? Title Pt will return to baseline AROM   ? Baseline Flex: 160  Abd: 163   ? Time 8   ? Period Weeks   ? Status On-going   ?  ? PT LONG TERM GOAL #2  ? Title Pt will be scheduled for ABC class or risk reduction education   ? Status On-going   ? ?  ?  ? ?  ? ? ? ? ? ? ?Stark Bray, PT ?08/02/2021, 10:54 AM ? ?   ?

## 2021-08-04 ENCOUNTER — Other Ambulatory Visit: Payer: Self-pay

## 2021-08-04 ENCOUNTER — Encounter: Payer: Self-pay | Admitting: Rehabilitation

## 2021-08-04 ENCOUNTER — Ambulatory Visit: Payer: Medicare Other | Admitting: Rehabilitation

## 2021-08-04 DIAGNOSIS — C50411 Malignant neoplasm of upper-outer quadrant of right female breast: Secondary | ICD-10-CM

## 2021-08-04 DIAGNOSIS — M6281 Muscle weakness (generalized): Secondary | ICD-10-CM

## 2021-08-04 DIAGNOSIS — R293 Abnormal posture: Secondary | ICD-10-CM

## 2021-08-04 DIAGNOSIS — Z483 Aftercare following surgery for neoplasm: Secondary | ICD-10-CM

## 2021-08-04 NOTE — Therapy (Signed)
?OUTPATIENT PHYSICAL THERAPY TREATMENT NOTE ? ? ?Patient Name: Marie Day ?MRN: [id number] ?DOB:1945/12/25, 76 y.o., female ?Today's Date: 08/04/2021 ? ?PCP: Pcp, No ?REFERRING PROVIDER: Erroll Luna, MD ? ? PT End of Session - 08/04/21 1100   ? ? Visit Number 6   ? Number of Visits 8   ? Date for PT Re-Evaluation 09/15/21   ? PT Start Time 1100   ? PT Stop Time 1147   ? PT Time Calculation (min) 47 min   ? Activity Tolerance Patient tolerated treatment well   ? Behavior During Therapy [facility] for tasks assessed/performed   ? ?  ?  ? ?  ? ? ?Past Medical History:  ?Diagnosis Date  ? Arthritis   ? Cancer [facility])   ? ?Past Surgical History:  ?Procedure Laterality Date  ? BREAST RECONSTRUCTION WITH PLACEMENT OF TISSUE EXPANDER AND FLEX HD (ACELLULAR HYDRATED DERMIS) Right 06/29/2021  ? Procedure: RIGHT BREAST RECONSTRUCTION WITH PLACEMENT OF TISSUE EXPANDER AND FLEX HD (ACELLULAR HYDRATED DERMIS);  Surgeon: Cindra Presume, MD;  Location: [facility];  Service: Plastics;  Laterality: Right;  ? MASTECTOMY W/ SENTINEL NODE BIOPSY Right 06/29/2021  ? Procedure: RIGHT MASTECTOMY WITH SENTINEL LYMPH NODE BIOPSY;  Surgeon: Erroll Luna, MD;  Location: [facility];  Service: General;  Laterality: Right;  ? WISDOM TOOTH EXTRACTION    ? ?Patient Active Problem List  ? Diagnosis Date Noted  ? S/P breast reconstruction 06/29/2021  ? Port-A-Cath in place 01/18/2021  ? Malignant neoplasm of upper-outer quadrant of right breast in female, estrogen receptor negative ([place]) 12/21/2020  ? Anemia 10/27/2020  ? Prediabetes 10/27/2020  ? 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  ? Unspecified age-related cataract 12/14/2015  ? ? ?REFERRING DIAG: [name] cancer ? ?THERAPY DIAG:  ?Malignant neoplasm of upper-outer quadrant of right breast in female, estrogen receptor negative ([place]) ? ?Muscle weakness (generalized) ? ?Abnormal posture ? ?Aftercare following surgery for neoplasm ? ?PERTINENT HISTORY:   ? [name] with expander on 06/29/21 with 0/4 LN positive. Drains removed 07/15/21. Will be having radiation consult 07/27/21  and will continue HP maintenance every 3 weeks.   ? ? ?PRECAUTIONS: Rt risk of lymphedema; expanders in place  ? ?SUBJECTIVE:  The Rt fill is now really tight and uncomfortable  ? ?PAIN:  ?Are you having pain? Yes ?I feel stiff everywhere  -  Rt expander: 2/10 ache  ? ? ?TODAY'S TREATMENT:  ? Manual Therapy  ?     ?  Myofascial Release To Rt axilla at area of cording in various positions  ?     ?  Manual Lymphatic Drainage (MLD) In Supine: 5 diaphragmatic breaths, Rt inguinal nodes and Rt axillo-inguinal anastomosis focusing on area of fullness/edema at lateral aspect of mastectomy incision   ?  Passive ROM In Supine: To Rt shoulder into flexion, abduction and D2 to pts tolerance   ?Exercise:  ? ?Rt shoulder AROM: 08/02/21 ? Flexion: 145 (160 baseline) ? Abduction: 158 (163 baseline) ? ? ?HOME EXERCISE PROGRAM: ?Post op exercises  ? ? ?  Plan - 07/28/21 1228   ?  ?  Clinical Impression Statement Cording is softer today but pt is more sore from the expander fill.  Focused more on MT.   ?  Personal Factors and Comorbidities Age;Comorbidity 3+   ?  Comorbidities CIPN, fatigue, chemotherapy   ?  Examination-Activity Limitations Reach Overhead;Lift   ?  Examination-Participation Restrictions Cleaning;Yard Work;Community Activity   ?  Stability/Clinical Decision Making Stable/Uncomplicated   ?  Rehab Potential Excellent   ?  PT Frequency 1x / week   ?  PT Duration 8 weeks   ?  PT Treatment/Interventions ADLs/Self Care Home Management;Therapeutic exercise;Patient/family education;Manual techniques   ?  PT Next Visit Plan Cont Rt PROM/cording release/AAROM - no scar massage or MLD over breast (cont MLD to lateral trunk avoiding breast where palpable fullness present) until at least 08/16/21 due to expander; consider progressing to supine scapular series in next 1-2 sessions (yellow theraband and 5  reps only)   ?  PT Home Exercise Plan post op   ?  Consulted and Agree with Plan of Care Patient   ?  ?   ?  ?  ? ? PT Long Term Goals - 07/21/21 1540   ? ?  ? PT LONG TERM GOAL #1  ? Title Pt will return to baseline AROM   ? Baseline Flex: 160  Abd: 163   ? Time 8   ? Period Weeks   ? Status On-going   ?  ? PT LONG TERM GOAL #2  ? Title Pt will be scheduled for ABC class or risk reduction education   ? Status On-going   ? ?  ?  ? ?  ? ? ? ? ? ? ?Stark Bray, PT ?08/04/2021, 11:45 AM ? ?   ?

## 2021-08-06 NOTE — Progress Notes (Signed)
Patient is a 76 year old female with PMH of right-sided breast cancer s/p right-sided mastectomy with immediate reconstruction performed 06/29/2021 by Dr. Claudia Desanctis who presents to clinic for postoperative follow-up. ? ?She was last seen here in clinic on 07/29/2021.  At that time, she complained of difficulty sleeping and was prescribed hydroxyzine.  Physical exam was reassuring and 70 cc injectable saline was placed into her expander for a total of 320/375 cc.  She expressed that she would like to have a left-sided mastopexy at time of implant exchange, but that her second phase of reconstruction may be delayed as RT needs to be completed soon as possible. ? ?Today, patient states that the hydroxyzine that was prescribed has not helped with her sleep issues.  She will discontinue the medication and follow-up with her primary care provider for ongoing management.  She suspects that it is at least partially related to stress involving her reconstruction and upcoming radiation.  She reports that she will be having 6 weeks of radiation, 5 times per week.  Patient then tells me that she has been having some tightness in her right breast since her last expander fill and does not feel as though she would like to proceed with additional fill today.  She reports that she is comfortable with the current size of right breast.  She understands that additional fill would not likely be possible subsequent to her RT. ? ?Physical exam is reassuring.  Skin is not too taut.  No erythema.  No wound dehiscence noted.  Mild scabbing over incision site, but largely has healed nicely.   ? ?We will have patient follow back up in approximately 8 weeks for assessment and to discuss next phase of reconstruction.  Picture(s) obtained of the patient and placed in the chart were with the patient's or guardian's permission.  She will call the clinic should she develop any questions or concerns in the interim. ? ? ? ? ?

## 2021-08-09 ENCOUNTER — Ambulatory Visit: Payer: Medicare Other | Admitting: Rehabilitation

## 2021-08-09 ENCOUNTER — Other Ambulatory Visit: Payer: Self-pay

## 2021-08-09 DIAGNOSIS — M6281 Muscle weakness (generalized): Secondary | ICD-10-CM

## 2021-08-09 DIAGNOSIS — R293 Abnormal posture: Secondary | ICD-10-CM

## 2021-08-09 DIAGNOSIS — Z483 Aftercare following surgery for neoplasm: Secondary | ICD-10-CM

## 2021-08-09 DIAGNOSIS — Z171 Estrogen receptor negative status [ER-]: Secondary | ICD-10-CM

## 2021-08-09 DIAGNOSIS — C50411 Malignant neoplasm of upper-outer quadrant of right female breast: Secondary | ICD-10-CM | POA: Diagnosis not present

## 2021-08-09 NOTE — Therapy (Signed)
?OUTPATIENT PHYSICAL THERAPY TREATMENT NOTE ? ? ?Patient Name: Marie Day ?MRN: [id number] ?DOB:[birth year], 76 y.o., female ?Today's Date: 08/09/2021 ? ?PCP: Pcp, No ?REFERRING PROVIDER: Erroll Luna, MD ? ? PT End of Session - 08/09/21 1149   ? ? Visit Number 7   ? Number of Visits 8   ? Date for PT Re-Evaluation 09/15/21   ? PT Start Time 1100   ? PT Stop Time 6754   ? PT Time Calculation (min) 49 min   ? Activity Tolerance Patient tolerated treatment well   ? Behavior During Therapy [facility] for tasks assessed/performed   ? ?  ?  ? ?  ? ? ? ?Past Medical History:  ?Diagnosis Date  ? Arthritis   ? Cancer [facility])   ? ?Past Surgical History:  ?Procedure Laterality Date  ? BREAST RECONSTRUCTION WITH PLACEMENT OF TISSUE EXPANDER AND FLEX HD (ACELLULAR HYDRATED DERMIS) Right 06/29/2021  ? Procedure: RIGHT BREAST RECONSTRUCTION WITH PLACEMENT OF TISSUE EXPANDER AND FLEX HD (ACELLULAR HYDRATED DERMIS);  Surgeon: Cindra Presume, MD;  Location: [place];  Service: Plastics;  Laterality: Right;  ? MASTECTOMY W/ SENTINEL NODE BIOPSY Right 06/29/2021  ? Procedure: RIGHT MASTECTOMY WITH SENTINEL LYMPH NODE BIOPSY;  Surgeon: Erroll Luna, MD;  Location: [place];  Service: General;  Laterality: Right;  ? WISDOM TOOTH EXTRACTION    ? ?Patient Active Problem List  ? Diagnosis Date Noted  ? S/P breast reconstruction 06/29/2021  ? Port-A-Cath in place 01/18/2021  ? Malignant neoplasm of upper-outer quadrant of right breast in female, estrogen receptor negative ([place]) 12/21/2020  ? Anemia 10/27/2020  ? Prediabetes 10/27/2020  ? 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  ? Unspecified age-related cataract 12/14/2015  ? ? ?REFERRING DIAG: [name] cancer ? ?THERAPY DIAG:  ?Malignant neoplasm of upper-outer quadrant of right breast in female, estrogen receptor negative ([place]) ? ?Muscle weakness (generalized) ? ?Abnormal posture ? ?Aftercare following surgery for neoplasm ? ?PERTINENT  HISTORY:  ? [name] with expander on 06/29/21 with 0/4 LN positive. Drains removed 07/15/21. Will be having radiation consult 07/27/21  and will continue HP maintenance every 3 weeks.   ? ? ?PRECAUTIONS: Rt risk of lymphedema; expanders in place  ? ?SUBJECTIVE:  Both arms are a bit stiff today, but the expander is not nearly as tight.  Almost back to normal. Still noticing the cording in the arm during ADLs.   ? ?PAIN:  ?Are you having pain? Yes ?3/10 upper arms, back,   ? ? ?TODAY'S TREATMENT:  ?08/09/21 ?Manual Therapy:   ?MFR: to Rt axilla at area of cording in various positions  ?MLD: briefly incorporated post MFR near cording  ?PROM: to the Rt shoulder into flexion, abduction and D2 flexion to tolerance ? ?Exercise x 103mn ?Pulleys 2' flexion and abduction ?Supine dowel flexion 4" x 10  ?C/cc shoulder circles x 5 each ?Extension presses isometric 5" x 5  with cueing as needed ?Supine horizontal abduction yellow x 5 with initial cueing ? Diagonals bil yellow x 5 with initial cueing  ?  ? ? ?08/02/21 ? Manual Therapy  ?     ?  Myofascial Release To Rt axilla at area of cording in various positions  ?     ?  Manual Lymphatic Drainage (MLD) In Supine: 5 diaphragmatic breaths, Rt inguinal nodes and Rt axillo-inguinal anastomosis focusing on area of fullness/edema at lateral aspect of mastectomy incision   ?  Passive ROM In Supine: To  Rt shoulder into flexion, abduction and D2 to pts tolerance   ?Exercise:  ? ?Rt shoulder AROM: 08/02/21 ? Flexion: 145 (160 baseline) ? Abduction: 158 (163 baseline) ? ? ?HOME EXERCISE PROGRAM: ?Post op exercises  ? ? ?  Plan - 07/28/21 1228   ?  ?  Clinical Impression Statement Cording is softer today but still present.  Added more resistance and TE today which was tolerated well.   ?  Personal Factors and Comorbidities Age;Comorbidity 3+   ?  Comorbidities CIPN, fatigue, chemotherapy   ?  Examination-Activity Limitations Reach Overhead;Lift   ?  Examination-Participation Restrictions  Cleaning;Yard Work;Community Activity   ?  Stability/Clinical Decision Making Stable/Uncomplicated   ?  Rehab Potential Excellent   ?  PT Frequency 1x / week   ?  PT Duration 8 weeks   ?  PT Treatment/Interventions ADLs/Self Care Home Management;Therapeutic exercise;Patient/family education;Manual techniques   ?  PT Next Visit Plan Cont Rt PROM/cording release/AAROM - no scar massage or MLD over breast (cont MLD to lateral trunk avoiding breast where palpable fullness present) until at least 08/16/21 due to expander; consider progressing to supine scapular series in next 1-2 sessions (yellow theraband and 5 reps only)   ?  PT Home Exercise Plan post op   ?  Consulted and Agree with Plan of Care Patient   ?  ?   ?  ?  ? ? PT Long Term Goals - 07/21/21 1540   ? ?  ? PT LONG TERM GOAL #1  ? Title Pt will return to baseline AROM   ? Baseline Flex: 160  Abd: 163   ? Time 8   ? Period Weeks   ? Status On-going   ?  ? PT LONG TERM GOAL #2  ? Title Pt will be scheduled for ABC class or risk reduction education   ? Status On-going   ? ?  ?  ? ?  ? ? ? ? ? ? ?Stark Bray, PT ?08/09/2021, 11:50 AM ? ?   ?

## 2021-08-10 ENCOUNTER — Encounter: Payer: Self-pay | Admitting: *Deleted

## 2021-08-11 ENCOUNTER — Ambulatory Visit: Payer: Medicare Other | Admitting: Rehabilitation

## 2021-08-11 ENCOUNTER — Encounter: Payer: Self-pay | Admitting: Rehabilitation

## 2021-08-11 ENCOUNTER — Other Ambulatory Visit: Payer: Self-pay

## 2021-08-11 DIAGNOSIS — R293 Abnormal posture: Secondary | ICD-10-CM

## 2021-08-11 DIAGNOSIS — C50411 Malignant neoplasm of upper-outer quadrant of right female breast: Secondary | ICD-10-CM | POA: Diagnosis not present

## 2021-08-11 DIAGNOSIS — Z483 Aftercare following surgery for neoplasm: Secondary | ICD-10-CM

## 2021-08-11 DIAGNOSIS — Z171 Estrogen receptor negative status [ER-]: Secondary | ICD-10-CM

## 2021-08-11 DIAGNOSIS — M6281 Muscle weakness (generalized): Secondary | ICD-10-CM

## 2021-08-11 NOTE — Therapy (Signed)
?OUTPATIENT PHYSICAL THERAPY TREATMENT NOTE ? ? ?Patient Name: Marie Day ?MRN: [id number] ?DOB:22-Jun-1945, 76 y.o., female ?Today's Date: 08/11/2021 ? ?PCP: Pcp, No ?REFERRING PROVIDER: Erroll Luna, MD ? ? PT End of Session - 08/11/21 1301   ? ? Visit Number 8   ? Number of Visits 10   ? Date for PT Re-Evaluation 09/15/21   ? PT Start Time 1303   ? PT Stop Time 1356   ? PT Time Calculation (min) 53 min   ? Activity Tolerance Patient tolerated treatment well   ? Behavior During Therapy [facility] for tasks assessed/performed   ? ?  ?  ? ?  ? ? ? ?Past Medical History:  ?Diagnosis Date  ? Arthritis   ? Cancer [facility])   ? ?Past Surgical History:  ?Procedure Laterality Date  ? BREAST RECONSTRUCTION WITH PLACEMENT OF TISSUE EXPANDER AND FLEX HD (ACELLULAR HYDRATED DERMIS) Right 06/29/2021  ? Procedure: RIGHT BREAST RECONSTRUCTION WITH PLACEMENT OF TISSUE EXPANDER AND FLEX HD (ACELLULAR HYDRATED DERMIS);  Surgeon: Cindra Presume, MD;  Location: [place];  Service: Plastics;  Laterality: Right;  ? MASTECTOMY W/ SENTINEL NODE BIOPSY Right 06/29/2021  ? Procedure: RIGHT MASTECTOMY WITH SENTINEL LYMPH NODE BIOPSY;  Surgeon: Erroll Luna, MD;  Location: [place];  Service: General;  Laterality: Right;  ? WISDOM TOOTH EXTRACTION    ? ?Patient Active Problem List  ? Diagnosis Date Noted  ? S/P breast reconstruction 06/29/2021  ? Port-A-Cath in place 01/18/2021  ? Malignant neoplasm of upper-outer quadrant of right breast in female, estrogen receptor negative ([place]) 12/21/2020  ? Anemia 10/27/2020  ? Prediabetes 10/27/2020  ? 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  ? Unspecified age-related cataract 12/14/2015  ? ? ?REFERRING DIAG: [name] cancer ? ?THERAPY DIAG:  ?Malignant neoplasm of upper-outer quadrant of right breast in female, estrogen receptor negative ([place]) - Plan: PT plan of care cert/re-cert ? ?Muscle weakness (generalized) - Plan: PT plan of care  cert/re-cert ? ?Abnormal posture - Plan: PT plan of care cert/re-cert ? ?Aftercare following surgery for neoplasm - Plan: PT plan of care cert/re-cert ? ?PERTINENT HISTORY:  ? [name] with expander on 06/29/21 with 0/4 LN positive. Drains removed 07/15/21. Will be having radiation consult 07/27/21  and will continue HP maintenance every 3 weeks.   ? ? ?PRECAUTIONS: Rt risk of lymphedema; expanders in place  ? ?SUBJECTIVE:  I slept without a bra and I slept a bit better.  ?PAIN:  ?Are you having pain? Yes the usual ?3/10 upper arms, back,   ? ? ?TODAY'S TREATMENT:  ?08/11/21 ?Manual Therapy:   ?MFR: to Rt axilla at area of cording in various positions  ?MLD: briefly incorporated post MFR near cording  ?PROM: to the Rt shoulder into flexion, abduction and D2 flexion to tolerance ? ?Exercise x 34min ?Pulleys 2' flexion and abduction ?Row yellow x10 with initial instruction ?Standing back againstt the ball bil ER yellow  ?Supine dowel flexion 4" x 10  ?C/cc shoulder circles x 5 each ?Extension presses isometric 5" x 5  with cueing as needed ?Supine horizontal abduction yellow x 5 with initial cueing ? Diagonals bil yellow x 5 with initial cueing  ? ?08/09/21 ?Manual Therapy:   ?MFR: to Rt axilla at area of cording in various positions  ?MLD: briefly incorporated post MFR near cording  ?PROM: to the Rt shoulder into flexion, abduction and D2 flexion to tolerance ? ?Exercise x min ?Pulleys 2' flexion and  abduction ?Extension presses isometric 5" x 5  with cueing as needed ?Supine horizontal abduction yellow x 8 with initial cueing ? Diagonals bil yellow x 5 with initial cueing  ?Updated HEP to include resistance ? ? ?Exercise:  ? ?Rt shoulder AROM: 08/02/21  08/11/21 ? Flexion: 145 (160 baseline) 160 - slight impingement   ? Abduction: 158 (163 baseline) 170 - tight in cord ? ? ?HOME EXERCISE PROGRAM: ?Post op exercises  ? ? ?  Plan - 07/28/21 1228   ?  ?  Clinical Impression Statement Pt has met all goals for AROM and will  complete the POC x 2 more visits to maximize knowledge of HEP and decrease cording although this seems to not limit motion anymore.    ?  Personal Factors and Comorbidities Age;Comorbidity 3+   ?  Comorbidities CIPN, fatigue, chemotherapy   ?  Examination-Activity Limitations Reach Overhead;Lift   ?  Examination-Participation Restrictions Cleaning;Yard Work;Community Activity   ?  Stability/Clinical Decision Making Stable/Uncomplicated   ?  Rehab Potential Excellent   ?  PT Frequency 1x / week   ?  PT Duration 8 weeks   ?  PT Treatment/Interventions ADLs/Self Care Home Management;Therapeutic exercise;Patient/family education;Manual techniques   ?  PT Next Visit Plan Cont Rt PROM/cording release/AAROM - no scar massage or MLD over breast (cont MLD to lateral trunk avoiding breast where palpable fullness present) until at least 08/16/21 due to expander; consider progressing to supine scapular series in next 1-2 sessions (yellow theraband and 5 reps only)   ?  PT Home Exercise Plan post op   ?  Consulted and Agree with Plan of Care Patient   ?  ?   ?  ?  ? ? PT Long Term Goals - 07/21/21 1540   ? ?  ? PT LONG TERM GOAL #1  ? Title Pt will return to baseline AROM   ? Baseline Flex: 160  Abd: 163   ? Time 8   ? Period Weeks   ? Status MET  ?  ? PT LONG TERM GOAL #2  ? Title Pt will be scheduled for ABC class or risk reduction education   ? Status MET  ? ?  ?  ? ?LTG #3 ?Pt will be ind with final HEP  ?1 week:       NEW    ? ? ? ? ? ?Stark Bray, PT ?08/11/2021, 5:12 PM ? ?   ?

## 2021-08-12 ENCOUNTER — Encounter: Payer: Self-pay | Admitting: Physician Assistant

## 2021-08-12 ENCOUNTER — Ambulatory Visit (INDEPENDENT_AMBULATORY_CARE_PROVIDER_SITE_OTHER): Payer: Medicare Other | Admitting: Physician Assistant

## 2021-08-12 ENCOUNTER — Other Ambulatory Visit: Payer: Self-pay

## 2021-08-12 DIAGNOSIS — C50411 Malignant neoplasm of upper-outer quadrant of right female breast: Secondary | ICD-10-CM

## 2021-08-12 DIAGNOSIS — Z171 Estrogen receptor negative status [ER-]: Secondary | ICD-10-CM

## 2021-08-16 ENCOUNTER — Other Ambulatory Visit: Payer: Self-pay

## 2021-08-16 ENCOUNTER — Ambulatory Visit: Payer: Medicare Other

## 2021-08-16 DIAGNOSIS — C50411 Malignant neoplasm of upper-outer quadrant of right female breast: Secondary | ICD-10-CM | POA: Diagnosis not present

## 2021-08-16 DIAGNOSIS — M6281 Muscle weakness (generalized): Secondary | ICD-10-CM

## 2021-08-16 DIAGNOSIS — Z171 Estrogen receptor negative status [ER-]: Secondary | ICD-10-CM

## 2021-08-16 DIAGNOSIS — Z483 Aftercare following surgery for neoplasm: Secondary | ICD-10-CM

## 2021-08-16 DIAGNOSIS — R293 Abnormal posture: Secondary | ICD-10-CM

## 2021-08-16 NOTE — Therapy (Signed)
?OUTPATIENT PHYSICAL THERAPY TREATMENT NOTE ? ? ?Patient Name: Marie Day ?MRN: [id number] ?DOB:12-11-1945, 76 y.o., female ?Today's Date: 08/16/2021 ? ?PCP: Pcp, No ?REFERRING PROVIDER: Erroll Luna, MD ? ? PT End of Session - 08/16/21 1206   ? ? Visit Number 9   ? Number of Visits 10   ? Date for PT Re-Evaluation 09/15/21   ? PT Start Time 1108   ? PT Stop Time 1206   ? PT Time Calculation (min) 58 min   ? Activity Tolerance Patient tolerated treatment well   ? Behavior During Therapy [facility] for tasks assessed/performed   ? ?  ?  ? ?  ? ? ? ? ?Past Medical History:  ?Diagnosis Date  ? Arthritis   ? Cancer [facility])   ? ?Past Surgical History:  ?Procedure Laterality Date  ? BREAST RECONSTRUCTION WITH PLACEMENT OF TISSUE EXPANDER AND FLEX HD (ACELLULAR HYDRATED DERMIS) Right 06/29/2021  ? Procedure: RIGHT BREAST RECONSTRUCTION WITH PLACEMENT OF TISSUE EXPANDER AND FLEX HD (ACELLULAR HYDRATED DERMIS);  Surgeon: Cindra Presume, MD;  Location: [place];  Service: Plastics;  Laterality: Right;  ? MASTECTOMY W/ SENTINEL NODE BIOPSY Right 06/29/2021  ? Procedure: RIGHT MASTECTOMY WITH SENTINEL LYMPH NODE BIOPSY;  Surgeon: Erroll Luna, MD;  Location: [place];  Service: General;  Laterality: Right;  ? WISDOM TOOTH EXTRACTION    ? ?Patient Active Problem List  ? Diagnosis Date Noted  ? S/P breast reconstruction 06/29/2021  ? Port-A-Cath in place 01/18/2021  ? Malignant neoplasm of upper-outer quadrant of right breast in female, estrogen receptor negative ([place]) 12/21/2020  ? Anemia 10/27/2020  ? Prediabetes 10/27/2020  ? 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  ? Unspecified age-related cataract 12/14/2015  ? ? ?REFERRING DIAG: [name] cancer ? ?THERAPY DIAG:  ?Malignant neoplasm of upper-outer quadrant of right breast in female, estrogen receptor negative ([place]) ? ?Muscle weakness (generalized) ? ?Abnormal posture ? ?Aftercare following surgery for neoplasm ? ?PERTINENT  HISTORY:  ? [name] with expander on 06/29/21 with 0/4 LN positive. Drains removed 07/15/21. Will be having radiation consult 07/27/21  and will continue HP maintenance every 3 weeks.   ? ? ?PRECAUTIONS: Rt risk of lymphedema; expanders in place  ? ?SUBJECTIVE:  I'm working on being more consistent with the theraband exercises Marcene Brawn gave me.  ? ?PAIN:  ?Are you having pain? no ? ? ? ?TODAY'S TREATMENT:  ? ?08/16/21:  ?Therapeutic Exercises:  ?Pulleys 2' each flexion and abduction with VCs to remind pt to decrease trunk lean with abduction ?Row yellow ball up wall x10 each into flexion and abduction , returning therapist demo and added 1# to Rt wrist for both  ?Supine: Rt UE punches to ceiling with 2#, 2x10 ?C/cc shoulder circles x 30 sec each with 1# ? ?Manual Therapy:   ?MFR: to Rt axilla at area of cording in various positions  ?MLD: briefly incorporated post MFR near cording : Short neck, 5 diaphragmatic breaths, Rt inguinal nodes, Rt axillo-inguinal anastomosis, and Rt upper arm then retracing steps. ?PROM: to the Rt shoulder into flexion, abduction and D2 flexion to tolerance ? ?08/11/21 ?Manual Therapy:   ?MFR: to Rt axilla at area of cording in various positions  ?MLD: briefly incorporated post MFR near cording  ?PROM: to the Rt shoulder into flexion, abduction and D2 flexion to tolerance ? ?Exercise x 31min ?Pulleys 2' flexion and abduction ?Row yellow x10 with initial instruction ?Standing back againstt the ball bil ER yellow  ?  Supine dowel flexion 4" x 10  ?C/cc shoulder circles x 5 each ?Extension presses isometric 5" x 5  with cueing as needed ?Supine horizontal abduction yellow x 5 with initial cueing ? Diagonals bil yellow x 5 with initial cueing  ? ?08/09/21 ?Manual Therapy:   ?MFR: to Rt axilla at area of cording in various positions  ?MLD: briefly incorporated post MFR near cording  ?PROM: to the Rt shoulder into flexion, abduction and D2 flexion to tolerance ? ?Exercise x min ?Pulleys 2' flexion and  abduction ?Extension presses isometric 5" x 5  with cueing as needed ?Supine horizontal abduction yellow x 8 with initial cueing ? Diagonals bil yellow x 5 with initial cueing  ?Updated HEP to include resistance ? ? ?Exercise:  ? ?Rt shoulder AROM: 08/02/21  08/11/21 ? Flexion: 145 (160 baseline) 160 - slight impingement   ? Abduction: 158 (163 baseline) 170 - tight in cord ? ? ?HOME EXERCISE PROGRAM: ?Post op exercises  ? ? ?  Plan - 07/28/21 1228   ?  ?  Clinical Impression Statement Pt continues with tolerating strength progression well without increased pain. Also continued with manual therapy working to decrease cording that is much improved since start of care.   ?  Personal Factors and Comorbidities Age;Comorbidity 3+   ?  Comorbidities CIPN, fatigue, chemotherapy   ?  Examination-Activity Limitations Reach Overhead;Lift   ?  Examination-Participation Restrictions Cleaning;Yard Work;Community Activity   ?  Stability/Clinical Decision Making Stable/Uncomplicated   ?  Rehab Potential Excellent   ?  PT Frequency 1x / week   ?  PT Duration 8 weeks   ?  PT Treatment/Interventions ADLs/Self Care Home Management;Therapeutic exercise;Patient/family education;Manual techniques   ?  PT Next Visit Plan Reassess goals. Possible D/C next; Cont Rt PROM/cording release/AAROM - no scar massage or MLD over breast (cont MLD to lateral trunk avoiding breast where palpable fullness present) until at least 08/16/21 due to expander  ?  PT Home Exercise Plan post op   ?  Consulted and Agree with Plan of Care Patient   ?  ?   ?  ?  ? ? PT Long Term Goals - 07/21/21 1540   ? ?  ? PT LONG TERM GOAL #1  ? Title Pt will return to baseline AROM   ? Baseline Flex: 160  Abd: 163   ? Time 8   ? Period Weeks   ? Status MET  ?  ? PT LONG TERM GOAL #2  ? Title Pt will be scheduled for ABC class or risk reduction education   ? Status MET  ? ?  ?  ? ?LTG #3 ?Pt will be ind with final HEP  ?1 week:       NEW    ? ? ? ? ? ?Otelia Limes,  PTA ?08/16/2021, 12:08 PM ? ?   ?

## 2021-08-16 NOTE — Progress Notes (Signed)
? ?Patient Care Team: ?Pcp, No as PCP - General ?Mauro Kaufmann, RN as Oncology Nurse Navigator ?Rockwell Germany, RN as Oncology Nurse Navigator ? ?DIAGNOSIS:  ?Encounter Diagnosis  ?Name Primary?  ? Malignant neoplasm of upper-outer quadrant of right breast in female, estrogen receptor negative ([place])   ? ? ?SUMMARY OF ONCOLOGIC HISTORY: ?Oncology History  ?Malignant neoplasm of upper-outer quadrant of right breast in female, estrogen receptor negative ([place])  ?11/17/2020 Initial Diagnosis  ? Work-up performed at [facility]: Palpable right breast mass.  Mammogram and ultrasound 11/10/2020: Spiculated 4.9 cm mass UOQ right breast with microcalcifications, biopsy revealed IDC ER/PR negative, HER2 positive 3+ by IHC, axillary lymph node positive (2 lymph nodes were detected) ?  ?12/03/2020 PET scan  ? Right breast malignancy SUV 4.6, subcentimeter right level 1 and 2 axillary lymph nodes.  No distant metastatic disease. ?  ?12/21/2020 Cancer Staging  ? Staging form: Breast, AJCC 8th Edition ?- Clinical stage from 12/21/2020: Stage IIB (cT2, cN1, cM0, G3, ER-, PR-, HER2+) - Signed by Nicholas Lose, MD on 12/21/2020 ?Stage prefix: Initial diagnosis ?Histologic grading system: 3 grade system ? ?  ?12/29/2020 -  Chemotherapy  ? Patient is on Treatment Plan : BREAST  Docetaxel + Carboplatin + Trastuzumab + Pertuzumab  (TCHP) q21d   ?   ?06/29/2021 Surgery  ? Right mastectomy: No residual cancer identified, 0/4 lymph nodes negative, complete pathologic response ?  ? ? ?CHIEF COMPLIANT: Herceptin and Perjeta ?  ? ?INTERVAL HISTORY: Marie Day is a 76 y.o. 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 She presents to the clinic today for Herceptin and Perjeta maintenance. 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 She complains of pain in fingers and inflammation. She states the chemo was great she didn't have any pain afterwards. ? ? ?ALLERGIES:   is allergic to sulfa antibiotics. ? ?MEDICATIONS:  ?Current Outpatient Medications  ?Medication Sig Dispense Refill  ? Acetaminophen (TYLENOL 8 HOUR ARTHRITIS PAIN PO) Take by mouth.    ? hydrOXYzine (VISTARIL) 25 MG capsule Take 1 capsule (25 mg total) by mouth at bedtime as needed for up to 30 doses (Difficulty sleeping). 30 capsule 0  ? lidocaine-prilocaine (EMLA) cream Apply to affected area once 30 g 3  ? ondansetron (ZOFRAN) 8 MG tablet Take 1 tablet (8 mg total) by mouth 2 (two) times daily as needed (Nausea or vomiting). Start on the third day after chemotherapy. (Patient not taking: Reported on 08/12/2021) 30 tablet 1  ? prochlorperazine (COMPAZINE) 10 MG tablet TAKE 1 TABLET (10 MG TOTAL) BY MOUTH EVERY 6 (SIX) HOURS AS NEEDED (NAUSEA OR VOMITING). (Patient not taking: Reported on 08/12/2021) 30 tablet 1  ? ?No current facility-administered medications for this visit.  ? ?Facility-Administered Medications Ordered in Other Visits  ?Medication Dose Route Frequency Provider Last Rate Last Admin  ? acetaminophen (TYLENOL) tablet 650 mg  650 mg Oral Once Nicholas Lose, MD      ? heparin lock flush 100 unit/mL  500 Units Intracatheter Once PRN Nicholas Lose, MD      ? pertuzumab (PERJETA) 420 mg in sodium chloride 0.9 % 250 mL chemo infusion  420 mg Intravenous Once Nicholas Lose, MD      ? sodium chloride flush (NS) 0.9 % injection 10 mL  10 mL Intracatheter PRN Nicholas Lose, MD      ? trastuzumab-dkst (OGIVRI) 400 mg in sodium chloride 0.9 % 250 mL chemo  infusion  400 mg Intravenous Once Nicholas Lose, MD      ? ? ?PHYSICAL EXAMINATION: ?ECOG PERFORMANCE STATUS: 1 - Symptomatic but completely ambulatory ? ?Vitals:  ? 08/17/21 1146  ?BP: (!) 148/70  ?Pulse: 84  ?Resp: 18  ?Temp: (!) 97.2 ?F (36.2 ?C)  ?SpO2: 99%  ? ?Filed Weights  ? 08/17/21 1146  ?Weight: 141 lb 12.8 oz (64.3 kg)  ? ?  ? ?LABORATORY DATA:  ?I have reviewed the data as listed ? ?  Latest Ref Rng & Units 08/17/2021  ? 11:21 AM 07/06/2021  ? 10:49  AM 06/28/2021  ? 12:20 PM  ?CMP  ?Glucose 70 - 99 mg/dL 99   123   89    ?BUN 8 - 23 mg/dL $Remove'10   12   8    'reQBeUa$ ?Creatinine 0.44 - 1.00 mg/dL 0.78   0.74   0.70    ?Sodium 135 - 145 mmol/L 140   139   139    ?Potassium 3.5 - 5.1 mmol/L 4.3   3.4   3.5    ?Chloride 98 - 111 mmol/L 106   106   106    ?CO2 22 - 32 mmol/L $RemoveB'28   27   25    'DClkQZzj$ ?Calcium 8.9 - 10.3 mg/dL 9.4   9.2   9.2    ?Total Protein 6.5 - 8.1 g/dL 6.6   6.7   6.4    ?Total Bilirubin 0.3 - 1.2 mg/dL 0.4   0.2   0.2    ?Alkaline Phos 38 - 126 U/L 96   90   89    ?AST 15 - 41 U/L $Remo'14   17   16    'LiZKH$ ?ALT 0 - 44 U/L $Remo'11   14   16    'pgJOn$ ? ? ?Lab Results  ?Component Value Date  ? WBC 6.1 08/17/2021  ? HGB 10.3 (L) 08/17/2021  ? HCT 31.5 (L) 08/17/2021  ? MCV 86.8 08/17/2021  ? PLT 260 08/17/2021  ? NEUTROABS 2.6 08/17/2021  ? ? ?ASSESSMENT & PLAN:  ?Malignant neoplasm of upper-outer quadrant of right breast in female, estrogen receptor negative ([place]) ?11/17/2020:Work[facility]up performed at [facility]: Palpable right breast mass.  Mammogram and ultrasound 11/10/2020: Spiculated 4.9 cm mass UOQ right breast with microcalcifications, biopsy revealed IDC ER/PR negative, HER2 positive 3+ by IHC, axillary lymph node positive (2 lymph nodes were detected) ?  ?Genetics were negative ?  ?Treatment Plan based on multidisciplinary tumor board: ?1. Neoadjuvant chemotherapy with [place] Perjeta ?6 cycles (started 12/29/2020-04/13/2021) followed by Herceptin Perjeta maintenance  for 1 year ?2. 06/29/2021:Right mastectomy: No residual cancer identified, 0/4 lymph nodes negative, complete pathologic response ?3. Followed by adjuvant radiation therapy ?------------------------------------------------------------------------------------------------------------------------- ?Current treatment: Herceptin Perjeta maintenance ?Toxicities: None  ?CTsimulation for radiation oncology on 08/19/2021 ? ?Diffuse arthralgias and myalgias: The symptoms have come back since chemotherapy has been discontinued ?Return to  clinic every 3 weeks for Herceptin Perjeta and every 6 weeks to follow-up with me. ? ? ? ?No orders of the defined types were placed in this encounter. ? ?The patient has a good understanding of the overall plan. she agrees with it. she will call with any problems that may develop before the next visit here. ?Total time spent: 30 mins including face to face time and time spent for planning, charting and co-ordination of care ? ? Harriette Ohara, MD ?08/17/21 ? ? ? I Gardiner Coins am scribing for Dr. Lindi Adie ? ?I have reviewed the above  documentation for accuracy and completeness, and I agree with the above. ?  ?

## 2021-08-17 ENCOUNTER — Encounter: Payer: Self-pay | Admitting: *Deleted

## 2021-08-17 ENCOUNTER — Inpatient Hospital Stay (HOSPITAL_BASED_OUTPATIENT_CLINIC_OR_DEPARTMENT_OTHER): Payer: Medicare Other | Admitting: Hematology and Oncology

## 2021-08-17 ENCOUNTER — Inpatient Hospital Stay: Payer: Medicare Other

## 2021-08-17 DIAGNOSIS — Z5112 Encounter for antineoplastic immunotherapy: Secondary | ICD-10-CM | POA: Diagnosis not present

## 2021-08-17 DIAGNOSIS — C50411 Malignant neoplasm of upper-outer quadrant of right female breast: Secondary | ICD-10-CM

## 2021-08-17 DIAGNOSIS — Z171 Estrogen receptor negative status [ER-]: Secondary | ICD-10-CM | POA: Diagnosis not present

## 2021-08-17 DIAGNOSIS — Z95828 Presence of other vascular implants and grafts: Secondary | ICD-10-CM

## 2021-08-17 LAB — CBC WITH DIFFERENTIAL (CANCER CENTER ONLY)
Abs Immature Granulocytes: 0.01 10*3/uL (ref 0.00–0.07)
Basophils Absolute: 0.1 10*3/uL (ref 0.0–0.1)
Basophils Relative: 1 %
Eosinophils Absolute: 0.2 10*3/uL (ref 0.0–0.5)
Eosinophils Relative: 4 %
HCT: 31.5 % — ABNORMAL LOW (ref 36.0–46.0)
Hemoglobin: 10.3 g/dL — ABNORMAL LOW (ref 12.0–15.0)
Immature Granulocytes: 0 %
Lymphocytes Relative: 43 %
Lymphs Abs: 2.6 10*3/uL (ref 0.7–4.0)
MCH: 28.4 pg (ref 26.0–34.0)
MCHC: 32.7 g/dL (ref 30.0–36.0)
MCV: 86.8 fL (ref 80.0–100.0)
Monocytes Absolute: 0.5 10*3/uL (ref 0.1–1.0)
Monocytes Relative: 9 %
Neutro Abs: 2.6 10*3/uL (ref 1.7–7.7)
Neutrophils Relative %: 43 %
Platelet Count: 260 10*3/uL (ref 150–400)
RBC: 3.63 MIL/uL — ABNORMAL LOW (ref 3.87–5.11)
RDW: 14 % (ref 11.5–15.5)
WBC Count: 6.1 10*3/uL (ref 4.0–10.5)
nRBC: 0 % (ref 0.0–0.2)

## 2021-08-17 LAB — CMP (CANCER CENTER ONLY)
ALT: 11 U/L (ref 0–44)
AST: 14 U/L — ABNORMAL LOW (ref 15–41)
Albumin: 4.1 g/dL (ref 3.5–5.0)
Alkaline Phosphatase: 96 U/L (ref 38–126)
Anion gap: 6 (ref 5–15)
BUN: 10 mg/dL (ref 8–23)
CO2: 28 mmol/L (ref 22–32)
Calcium: 9.4 mg/dL (ref 8.9–10.3)
Chloride: 106 mmol/L (ref 98–111)
Creatinine: 0.78 mg/dL (ref 0.44–1.00)
GFR, Estimated: >60 mL/min (ref >60)
Glucose, Bld: 99 mg/dL (ref 70–99)
Potassium: 4.3 mmol/L (ref 3.5–5.1)
Sodium: 140 mmol/L (ref 135–145)
Total Bilirubin: 0.4 mg/dL (ref 0.3–1.2)
Total Protein: 6.6 g/dL (ref 6.5–8.1)

## 2021-08-17 MED ORDER — SODIUM CHLORIDE 0.9 % IV SOLN: Freq: Once | INTRAVENOUS | Status: AC

## 2021-08-17 MED ORDER — SODIUM CHLORIDE 0.9% FLUSH
10.0000 mL | INTRAVENOUS | Status: DC | PRN
  Administered 2021-08-17: 10 mL

## 2021-08-17 MED ORDER — HEPARIN SOD (PORK) LOCK FLUSH 100 UNIT/ML IV SOLN
500.0000 [IU] | Freq: Once | INTRAVENOUS | Status: AC | PRN
  Administered 2021-08-17: 500 [IU]

## 2021-08-17 MED ORDER — SODIUM CHLORIDE 0.9% FLUSH
10.0000 mL | Freq: Once | INTRAVENOUS | Status: AC
  Administered 2021-08-17: 10 mL

## 2021-08-17 MED ORDER — TRASTUZUMAB-DKST CHEMO 150 MG IV SOLR
400.0000 mg | Freq: Once | INTRAVENOUS | Status: AC
  Administered 2021-08-17: 400 mg via INTRAVENOUS
  Filled 2021-08-17: qty 19.05

## 2021-08-17 MED ORDER — SODIUM CHLORIDE 0.9 % IV SOLN
420.0000 mg | Freq: Once | INTRAVENOUS | Status: AC
  Administered 2021-08-17: 420 mg via INTRAVENOUS
  Filled 2021-08-17: qty 14

## 2021-08-17 MED ORDER — DIPHENHYDRAMINE HCL 25 MG PO CAPS
50.0000 mg | ORAL_CAPSULE | Freq: Once | ORAL | Status: AC
  Administered 2021-08-17: 50 mg via ORAL
  Filled 2021-08-17: qty 2

## 2021-08-17 MED ORDER — ACETAMINOPHEN 325 MG PO TABS: 650.0000 mg | ORAL_TABLET | Freq: Once | ORAL | Status: DC

## 2021-08-17 NOTE — Assessment & Plan Note (Signed)
11/17/2020:Work-up performed at [facility]: Palpable right breast mass. ?Mammogram and ultrasound 11/10/2020: Spiculated 4.9 cm mass UOQ right breast with microcalcifications, biopsy revealed IDC ER/PR negative, HER2 positive 3+ by IHC, axillary lymph node positive (2 lymph nodes were detected) ?? ?Genetics were negative ?? ?Treatment Plan?based on multidisciplinary tumor board: ?1. Neoadjuvant chemotherapy with TCH Perjeta ?6 cycles?(started 12/29/2020-04/13/2021)?followed by Herceptin Perjeta maintenance  for 1 year ?2.?06/29/2021:Right mastectomy: No residual cancer identified, 0/4 lymph nodes negative, complete pathologic response ?3. Followed by adjuvant radiation therapy ?------------------------------------------------------------------------------------------------------------------------- ?Current treatment: Herceptin Perjeta maintenance ?Toxicities: None  ?CT sedation for radiation oncology on 08/19/2021 ? ?Return to clinic every 3 weeks for Herceptin Perjeta and every 6 weeks to follow-up with me. ? ?

## 2021-08-17 NOTE — Patient Instructions (Signed)
[facility]  Discharge Instructions: ?Thank you for choosing [facility] to provide your oncology and hematology care.  ? ?If you have a lab appointment with the [facility], please go directly to the [facility] and check in at the registration area. ?  ?Wear comfortable clothing and clothing appropriate for easy access to any Portacath or PICC line.  ? ?We strive to give you quality time with your provider. You may need to reschedule your appointment if you arrive late (15 or more minutes).  Arriving late affects you and other patients whose appointments are after yours.  Also, if you miss three or more appointments without notifying the office, you may be dismissed from the clinic at the provider?s discretion.    ?  ?For prescription refill requests, have your pharmacy contact our office and allow 72 hours for refills to be completed.   ? ?Today you received the following chemotherapy and/or immunotherapy agents herceptin, perjeta    ?  ?To help prevent nausea and vomiting after your treatment, we encourage you to take your nausea medication as directed. ? ?BELOW ARE SYMPTOMS THAT SHOULD BE REPORTED IMMEDIATELY: ?*FEVER GREATER THAN 100.4 F (38 ?C) OR HIGHER ?*CHILLS OR SWEATING ?*NAUSEA AND VOMITING THAT IS NOT CONTROLLED WITH YOUR NAUSEA MEDICATION ?*UNUSUAL SHORTNESS OF BREATH ?*UNUSUAL BRUISING OR BLEEDING ?*URINARY PROBLEMS (pain or burning when urinating, or frequent urination) ?*BOWEL PROBLEMS (unusual diarrhea, constipation, pain near the anus) ?TENDERNESS IN MOUTH AND THROAT WITH OR WITHOUT PRESENCE OF ULCERS (sore throat, sores in mouth, or a toothache) ?UNUSUAL RASH, SWELLING OR PAIN  ?UNUSUAL VAGINAL DISCHARGE OR ITCHING  ? ?Items with * indicate a potential emergency and should be followed up as soon as possible or go to the Emergency Department if any problems should occur. ? ?Please show the CHEMOTHERAPY ALERT CARD or IMMUNOTHERAPY ALERT CARD at  check-in to the Emergency Department and triage nurse. ? ?Should you have questions after your visit or need to cancel or reschedule your appointment, please contact [facility]  Dept: [phone]  and follow the prompts.  Office hours are 8:00 a.m. to 4:30 p.m. Monday - Friday. Please note that voicemails left after 4:00 p.m. may not be returned until the following business day.  We are closed weekends and major holidays. You have access to a nurse at all times for urgent questions. Please call the main number to the clinic Dept: [phone] and follow the prompts. ? ? ?For any non-urgent questions, you may also contact your provider using MyChart. We now offer e-Visits for anyone 75 and older to request care online for non-urgent symptoms. For details visit [email or web address]. ?  ?Also download the MyChart app! Go to the app store, search "MyChart", open the app, select [facility], and log in with your MyChart username and password. ? ?Due to Covid, a mask is required upon entering the hospital/clinic. If you do not have a mask, one will be given to you upon arrival. For doctor visits, patients may have 1 support person aged 75 or older with them. For treatment visits, patients cannot have anyone with them due to current Covid guidelines and our immunocompromised population.  ? ?

## 2021-08-17 NOTE — Progress Notes (Signed)
Patient reports she took tylenol for her arthritis at 11am. She has been having to take this twice a day recently due to the pain returning. Tylenol not given with premedications.  ?

## 2021-08-18 ENCOUNTER — Encounter: Payer: Self-pay | Admitting: Rehabilitation

## 2021-08-18 ENCOUNTER — Ambulatory Visit: Payer: Medicare Other | Admitting: Rehabilitation

## 2021-08-18 DIAGNOSIS — Z483 Aftercare following surgery for neoplasm: Secondary | ICD-10-CM

## 2021-08-18 DIAGNOSIS — Z171 Estrogen receptor negative status [ER-]: Secondary | ICD-10-CM

## 2021-08-18 DIAGNOSIS — C50411 Malignant neoplasm of upper-outer quadrant of right female breast: Secondary | ICD-10-CM | POA: Diagnosis not present

## 2021-08-18 DIAGNOSIS — R293 Abnormal posture: Secondary | ICD-10-CM

## 2021-08-18 DIAGNOSIS — M6281 Muscle weakness (generalized): Secondary | ICD-10-CM

## 2021-08-18 NOTE — Therapy (Signed)
?OUTPATIENT PHYSICAL THERAPY TREATMENT NOTE ? ? ?Patient Name: Marie Day ?MRN: [id number] ?DOB:[birth year], 76 y.o., female ?Today's Date: 08/18/2021 ? ?PCP: Pcp, No ?REFERRING PROVIDER: Erroll Luna, MD ? ? PT End of Session - 08/18/21 1100   ? ? Visit Number 10   ? Number of Visits 10   ? Date for PT Re-Evaluation 09/15/21   ? PT Start Time 1102   ? PT Stop Time 1155   ? PT Time Calculation (min) 53 min   ? Activity Tolerance Patient tolerated treatment well   ? Behavior During Therapy [facility] for tasks assessed/performed   ? ?  ?  ? ?  ? ? ? ? ?Past Medical History:  ?Diagnosis Date  ? Arthritis   ? Cancer [facility])   ? ?Past Surgical History:  ?Procedure Laterality Date  ? BREAST RECONSTRUCTION WITH PLACEMENT OF TISSUE EXPANDER AND FLEX HD (ACELLULAR HYDRATED DERMIS) Right 06/29/2021  ? Procedure: RIGHT BREAST RECONSTRUCTION WITH PLACEMENT OF TISSUE EXPANDER AND FLEX HD (ACELLULAR HYDRATED DERMIS);  Surgeon: Cindra Presume, MD;  Location: [facility];  Service: Plastics;  Laterality: Right;  ? MASTECTOMY W/ SENTINEL NODE BIOPSY Right 06/29/2021  ? Procedure: RIGHT MASTECTOMY WITH SENTINEL LYMPH NODE BIOPSY;  Surgeon: Erroll Luna, MD;  Location: [facility];  Service: General;  Laterality: Right;  ? WISDOM TOOTH EXTRACTION    ? ?Patient Active Problem List  ? Diagnosis Date Noted  ? S/P breast reconstruction 06/29/2021  ? Port-A-Cath in place 01/18/2021  ? Malignant neoplasm of upper-outer quadrant of right breast in female, estrogen receptor negative ([place]) 12/21/2020  ? Anemia 10/27/2020  ? Prediabetes 10/27/2020  ? 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  ? Unspecified age-related cataract 12/14/2015  ? ? ?REFERRING DIAG: [name] cancer ? ?THERAPY DIAG:  ?Malignant neoplasm of upper-outer quadrant of right breast in female, estrogen receptor negative ([place]) ? ?Muscle weakness (generalized) ? ?Abnormal posture ? ?Aftercare following surgery for neoplasm ? ?PERTINENT  HISTORY:  ? [name] with expander on 06/29/21 with 0/4 LN positive. Drains removed 07/15/21. Will be having radiation consult 07/27/21  and will continue HP maintenance every 3 weeks.   ? ? ?PRECAUTIONS: Rt risk of lymphedema; expanders in place  ? ?SUBJECTIVE:  I feel like I can be done now.  My radiation simulation is tomorrow.   ? ?PAIN:  ?Are you having pain? A bit tightness and soreness expander 2/10 ? ? ? ?TODAY'S TREATMENT:  ?08/18/21:  ?Reassessed goals and AROM ?Held on exercise due to OA flare ?Manual Therapy:   ?MFR: to Rt axilla at area of cording in various positions  ?MLD: briefly incorporated post MFR near cording : Short neck, 5 diaphragmatic breaths, Rt inguinal nodes, Rt axillo-inguinal anastomosis, and Rt upper arm then retracing steps. ?PROM: to the Rt shoulder into flexion, abduction and D2 flexion to tolerance ? ?08/16/21:  ?Therapeutic Exercises:  ?Pulleys 2' each flexion and abduction with VCs to remind pt to decrease trunk lean with abduction ?Row yellow ball up wall x10 each into flexion and abduction , returning therapist demo and added 1# to Rt wrist for both  ?Supine: Rt UE punches to ceiling with 2#, 2x10 ?C/cc shoulder circles x 30 sec each with 1# ? ?Manual Therapy:   ?MFR: to Rt axilla at area of cording in various positions  ?MLD: briefly incorporated post MFR near cording : Short neck, 5 diaphragmatic breaths, Rt inguinal nodes, Rt axillo-inguinal anastomosis, and Rt upper arm then retracing  steps. ?PROM: to the Rt shoulder into flexion, abduction and D2 flexion to tolerance ? ?08/11/21 ?Manual Therapy:   ?MFR: to Rt axilla at area of cording in various positions  ?MLD: briefly incorporated post MFR near cording  ?PROM: to the Rt shoulder into flexion, abduction and D2 flexion to tolerance ? ?Exercise x 47min ?Pulleys 2' flexion and abduction ?Row yellow x10 with initial instruction ?Standing back againstt the ball bil ER yellow  ?Supine dowel flexion 4" x 10  ?C/cc shoulder circles  x 5 each ?Extension presses isometric 5" x 5  with cueing as needed ?Supine horizontal abduction yellow x 5 with initial cueing ? Diagonals bil yellow x 5 with initial cueing  ? ? ?Exercise:  ? ?Rt shoulder AROM: 08/02/21  08/11/21    08/18/21 ? Flexion: 145 (160 baseline) 160 - slight impingement   155 (more OA pain) ? Abduction: 158 (163 baseline) 170 - tight in cord   165 (more OA pain) ? ? ?HOME EXERCISE PROGRAM: ?Post op exercises  ? ? ?  Plan - 07/28/21 1228   ?  ?  Clinical Impression Statement Pt is ready for DC today.  Pt will be starting radiation and has returned to baseline AROM - limited a bit more today due to OA pain returning.  Pt is aware she can return at any time.    ?  Personal Factors and Comorbidities Age;Comorbidity 3+   ?  Comorbidities CIPN, fatigue, chemotherapy   ?  Examination-Activity Limitations Reach Overhead;Lift   ?  Examination-Participation Restrictions Cleaning;Yard Work;Community Activity   ?  Stability/Clinical Decision Making Stable/Uncomplicated   ?  Rehab Potential Excellent   ?  PT Frequency 1x / week   ?  PT Duration 8 weeks   ?  PT Treatment/Interventions ADLs/Self Care Home Management;Therapeutic exercise;Patient/family education;Manual techniques   ?  PT Next Visit Plan Reassess goals. Possible D/C next; Cont Rt PROM/cording release/AAROM - no scar massage or MLD over breast (cont MLD to lateral trunk avoiding breast where palpable fullness present) until at least 08/16/21 due to expander  ?  PT Home Exercise Plan post op   ?  Consulted and Agree with Plan of Care Patient   ?  ?   ?  ?  ? ? PT Long Term Goals - 07/21/21 1540   ? ?  ? PT LONG TERM GOAL #1  ? Title Pt will return to baseline AROM   ? Baseline Flex: 160  Abd: 163   ? Time 8   ? Period Weeks   ? Status MET  ?  ? PT LONG TERM GOAL #2  ? Title Pt will be scheduled for ABC class or risk reduction education   ? Status MET  ? ?  ?  ? ?LTG #3 ?Pt will be ind with final HEP  ?1 week:       MET   ? ? ? ? ? ?Stark Bray, PT ?08/18/2021, 5:07 PM ? ?  PHYSICAL THERAPY DISCHARGE SUMMARY ? ?Visits from Start of Care: 10 ? ?Current functional level related to goals / functional outcomes: ?All goals met ?  ?Remaining deficits: ?Cording and lmyphedema risk ?  ?Education / Equipment: ?Final HEP  ?Plan: ?Patient agrees to discharge.  Patient goals were not met. Patient is being discharged due to meeting the stated rehab goals.    ? ?Shan Levans, PT ? ? ?

## 2021-08-19 ENCOUNTER — Other Ambulatory Visit: Payer: Self-pay

## 2021-08-19 ENCOUNTER — Ambulatory Visit
Admission: RE | Admit: 2021-08-19 | Discharge: 2021-08-19 | Disposition: A | Discharge status: Home / Self Care | Payer: Medicare Other | Source: Ambulatory Visit | Attending: Radiation Oncology | Admitting: Radiation Oncology

## 2021-08-19 DIAGNOSIS — Z510 Encounter for antineoplastic radiation therapy: Secondary | ICD-10-CM | POA: Insufficient documentation

## 2021-08-19 DIAGNOSIS — C50411 Malignant neoplasm of upper-outer quadrant of right female breast: Secondary | ICD-10-CM | POA: Insufficient documentation

## 2021-08-23 ENCOUNTER — Other Ambulatory Visit: Payer: Self-pay | Admitting: Physician Assistant

## 2021-08-25 ENCOUNTER — Encounter: Payer: Self-pay | Admitting: *Deleted

## 2021-08-25 DIAGNOSIS — Z171 Estrogen receptor negative status [ER-]: Secondary | ICD-10-CM | POA: Diagnosis not present

## 2021-08-25 DIAGNOSIS — Z5189 Encounter for other specified aftercare: Secondary | ICD-10-CM | POA: Diagnosis not present

## 2021-08-25 DIAGNOSIS — C50411 Malignant neoplasm of upper-outer quadrant of right female breast: Secondary | ICD-10-CM | POA: Insufficient documentation

## 2021-08-25 DIAGNOSIS — Z5112 Encounter for antineoplastic immunotherapy: Secondary | ICD-10-CM | POA: Diagnosis not present

## 2021-08-25 DIAGNOSIS — Z510 Encounter for antineoplastic radiation therapy: Secondary | ICD-10-CM | POA: Insufficient documentation

## 2021-08-26 ENCOUNTER — Other Ambulatory Visit: Payer: Self-pay

## 2021-08-26 ENCOUNTER — Ambulatory Visit
Admission: RE | Admit: 2021-08-26 | Discharge: 2021-08-26 | Disposition: A | Discharge status: Home / Self Care | Payer: Medicare Other | Source: Ambulatory Visit | Attending: Radiation Oncology | Admitting: Radiation Oncology

## 2021-08-26 DIAGNOSIS — Z510 Encounter for antineoplastic radiation therapy: Secondary | ICD-10-CM | POA: Diagnosis not present

## 2021-08-27 ENCOUNTER — Ambulatory Visit
Admission: RE | Admit: 2021-08-27 | Discharge: 2021-08-27 | Disposition: A | Discharge status: Home / Self Care | Payer: Medicare Other | Source: Ambulatory Visit | Attending: Radiation Oncology | Admitting: Radiation Oncology

## 2021-08-27 DIAGNOSIS — Z171 Estrogen receptor negative status [ER-]: Secondary | ICD-10-CM

## 2021-08-27 DIAGNOSIS — Z510 Encounter for antineoplastic radiation therapy: Secondary | ICD-10-CM | POA: Diagnosis not present

## 2021-08-27 MED ORDER — RADIAPLEXRX EX GEL: Freq: Once | CUTANEOUS | Status: AC

## 2021-08-27 NOTE — Progress Notes (Signed)
Pt here for patient teaching.  Pt given Radiation and You booklet, skin care instructions, and Radiaplex.  Patient was advised to obtain over the counter aluminum free deodorant.  Reviewed areas of pertinence such as fatigue, hair loss, skin changes, breast tenderness, and breast swelling. Pt able to give teach back of to pat skin and use unscented/gentle soap,apply Radiaplex bid, avoid applying anything to skin within 4 hours of treatment, avoid wearing an under wire bra, and to use an electric razor if they must shave. Pt verbalizes understanding of information given and will contact nursing with any questions or concerns.   ? ?Gloriajean Dell. Leonie Green, BSN  ?

## 2021-08-30 ENCOUNTER — Other Ambulatory Visit: Payer: Self-pay

## 2021-08-30 ENCOUNTER — Ambulatory Visit
Admission: RE | Admit: 2021-08-30 | Discharge: 2021-08-30 | Disposition: A | Discharge status: Home / Self Care | Payer: Medicare Other | Source: Ambulatory Visit | Attending: Radiation Oncology | Admitting: Radiation Oncology

## 2021-08-30 DIAGNOSIS — Z510 Encounter for antineoplastic radiation therapy: Secondary | ICD-10-CM | POA: Diagnosis not present

## 2021-08-31 ENCOUNTER — Ambulatory Visit
Admission: RE | Admit: 2021-08-31 | Discharge: 2021-08-31 | Disposition: A | Discharge status: Home / Self Care | Payer: Medicare Other | Source: Ambulatory Visit | Attending: Radiation Oncology | Admitting: Radiation Oncology

## 2021-08-31 DIAGNOSIS — Z510 Encounter for antineoplastic radiation therapy: Secondary | ICD-10-CM | POA: Diagnosis not present

## 2021-09-01 ENCOUNTER — Other Ambulatory Visit: Payer: Self-pay

## 2021-09-01 ENCOUNTER — Ambulatory Visit
Admission: RE | Admit: 2021-09-01 | Discharge: 2021-09-01 | Disposition: A | Discharge status: Home / Self Care | Payer: Medicare Other | Source: Ambulatory Visit | Attending: Radiation Oncology | Admitting: Radiation Oncology

## 2021-09-01 DIAGNOSIS — Z510 Encounter for antineoplastic radiation therapy: Secondary | ICD-10-CM | POA: Diagnosis not present

## 2021-09-02 ENCOUNTER — Ambulatory Visit
Admission: RE | Admit: 2021-09-02 | Discharge: 2021-09-02 | Disposition: A | Discharge status: Home / Self Care | Payer: Medicare Other | Source: Ambulatory Visit | Attending: Radiation Oncology | Admitting: Radiation Oncology

## 2021-09-02 DIAGNOSIS — Z510 Encounter for antineoplastic radiation therapy: Secondary | ICD-10-CM | POA: Diagnosis not present

## 2021-09-03 ENCOUNTER — Other Ambulatory Visit: Payer: Self-pay

## 2021-09-03 ENCOUNTER — Ambulatory Visit
Admission: RE | Admit: 2021-09-03 | Discharge: 2021-09-03 | Disposition: A | Discharge status: Home / Self Care | Payer: Medicare Other | Source: Ambulatory Visit | Attending: Radiation Oncology | Admitting: Radiation Oncology

## 2021-09-03 DIAGNOSIS — Z510 Encounter for antineoplastic radiation therapy: Secondary | ICD-10-CM | POA: Diagnosis not present

## 2021-09-06 ENCOUNTER — Ambulatory Visit
Admission: RE | Admit: 2021-09-06 | Discharge: 2021-09-06 | Disposition: A | Discharge status: Home / Self Care | Payer: Medicare Other | Source: Ambulatory Visit | Attending: Radiation Oncology | Admitting: Radiation Oncology

## 2021-09-06 ENCOUNTER — Other Ambulatory Visit: Payer: Self-pay

## 2021-09-06 DIAGNOSIS — Z510 Encounter for antineoplastic radiation therapy: Secondary | ICD-10-CM | POA: Diagnosis not present

## 2021-09-07 ENCOUNTER — Other Ambulatory Visit: Payer: Medicare Other

## 2021-09-07 ENCOUNTER — Other Ambulatory Visit: Payer: Self-pay | Admitting: *Deleted

## 2021-09-07 ENCOUNTER — Ambulatory Visit: Payer: Medicare Other | Admitting: Hematology and Oncology

## 2021-09-07 ENCOUNTER — Inpatient Hospital Stay: Payer: Medicare Other

## 2021-09-07 ENCOUNTER — Other Ambulatory Visit: Payer: Self-pay

## 2021-09-07 ENCOUNTER — Ambulatory Visit
Admission: RE | Admit: 2021-09-07 | Discharge: 2021-09-07 | Disposition: A | Discharge status: Home / Self Care | Payer: Medicare Other | Source: Ambulatory Visit | Attending: Radiation Oncology | Admitting: Radiation Oncology

## 2021-09-07 VITALS — BP 130/73 | HR 78 | Temp 98.1°F | Resp 18 | Wt 141.8 lb

## 2021-09-07 DIAGNOSIS — Z5112 Encounter for antineoplastic immunotherapy: Secondary | ICD-10-CM | POA: Insufficient documentation

## 2021-09-07 DIAGNOSIS — Z5181 Encounter for therapeutic drug level monitoring: Secondary | ICD-10-CM

## 2021-09-07 DIAGNOSIS — Z5189 Encounter for other specified aftercare: Secondary | ICD-10-CM | POA: Insufficient documentation

## 2021-09-07 DIAGNOSIS — C50411 Malignant neoplasm of upper-outer quadrant of right female breast: Secondary | ICD-10-CM | POA: Insufficient documentation

## 2021-09-07 DIAGNOSIS — Z171 Estrogen receptor negative status [ER-]: Secondary | ICD-10-CM | POA: Insufficient documentation

## 2021-09-07 DIAGNOSIS — Z510 Encounter for antineoplastic radiation therapy: Secondary | ICD-10-CM | POA: Diagnosis not present

## 2021-09-07 LAB — RAD ONC ARIA SESSION SUMMARY
Course Elapsed Days: 12
Plan Fractions Treated to Date: 5
Plan Fractions Treated to Date: 9
Plan Prescribed Dose Per Fraction: 1.8 Gy
Plan Prescribed Dose Per Fraction: 1.8 Gy
Plan Total Fractions Prescribed: 14
Plan Total Fractions Prescribed: 28
Plan Total Prescribed Dose: 25.2 Gy
Plan Total Prescribed Dose: 50.4 Gy
Reference Point Dosage Given to Date: 16.2 Gy
Reference Point Dosage Given to Date: 16.2 Gy
Reference Point Session Dosage Given: 1.8 Gy
Reference Point Session Dosage Given: 1.8 Gy
Session Number: 9

## 2021-09-07 MED ORDER — SODIUM CHLORIDE 0.9% FLUSH
10.0000 mL | INTRAVENOUS | Status: DC | PRN
  Administered 2021-09-07: 10 mL

## 2021-09-07 MED ORDER — ACETAMINOPHEN 325 MG PO TABS
650.0000 mg | ORAL_TABLET | Freq: Once | ORAL | Status: AC
  Administered 2021-09-07: 650 mg via ORAL
  Filled 2021-09-07: qty 2

## 2021-09-07 MED ORDER — SODIUM CHLORIDE 0.9 % IV SOLN: Freq: Once | INTRAVENOUS | Status: AC

## 2021-09-07 MED ORDER — SODIUM CHLORIDE 0.9 % IV SOLN
420.0000 mg | Freq: Once | INTRAVENOUS | Status: AC
  Administered 2021-09-07: 420 mg via INTRAVENOUS
  Filled 2021-09-07: qty 14

## 2021-09-07 MED ORDER — HEPARIN SOD (PORK) LOCK FLUSH 100 UNIT/ML IV SOLN
500.0000 [IU] | Freq: Once | INTRAVENOUS | Status: AC | PRN
  Administered 2021-09-07: 500 [IU]

## 2021-09-07 MED ORDER — DIPHENHYDRAMINE HCL 25 MG PO CAPS
50.0000 mg | ORAL_CAPSULE | Freq: Once | ORAL | Status: AC
  Administered 2021-09-07: 50 mg via ORAL
  Filled 2021-09-07: qty 2

## 2021-09-07 MED ORDER — TRASTUZUMAB-DKST CHEMO 150 MG IV SOLR
400.0000 mg | Freq: Once | INTRAVENOUS | Status: AC
  Administered 2021-09-07: 400 mg via INTRAVENOUS
  Filled 2021-09-07: qty 19.05

## 2021-09-07 NOTE — Patient Instructions (Signed)
Marie Day  Discharge Instructions: ?Thank you for choosing [facility] to provide your oncology and hematology care.  ? ?If you have a lab appointment with the [facility], please go directly to the [facility] and check in at the registration area. ?  ?Wear comfortable clothing and clothing appropriate for easy access to any Portacath or PICC line.  ? ?We strive to give you quality time with your provider. You may need to reschedule your appointment if you arrive late (15 or more minutes).  Arriving late affects you and other patients whose appointments are after yours.  Also, if you miss three or more appointments without notifying the office, you may be dismissed from the clinic at the provider?s discretion.    ?  ?For prescription refill requests, have your pharmacy contact our office and allow 72 hours for refills to be completed.   ? ?Today you received the following chemotherapy and/or immunotherapy agents: Og[place]ri, Pertuzumab.     ?  ?To help prevent nausea and vomiting after your treatment, we encourage you to take your nausea medication as directed. ? ?BELOW ARE SYMPTOMS THAT SHOULD BE REPORTED IMMEDIATELY: ?*FEVER GREATER THAN 100.4 F (38 ?C) OR HIGHER ?*CHILLS OR SWEATING ?*NAUSEA AND VOMITING THAT IS NOT CONTROLLED WITH YOUR NAUSEA MEDICATION ?*UNUSUAL SHORTNESS OF BREATH ?*UNUSUAL BRUISING OR BLEEDING ?*URINARY PROBLEMS (pain or burning when urinating, or frequent urination) ?*BOWEL PROBLEMS (unusual diarrhea, constipation, pain near the anus) ?TENDERNESS IN MOUTH AND THROAT WITH OR WITHOUT PRESENCE OF ULCERS (sore throat, sores in mouth, or a toothache) ?UNUSUAL RASH, SWELLING OR PAIN  ?UNUSUAL VAGINAL DISCHARGE OR ITCHING  ? ?Items with * indicate a potential emergency and should be followed up as soon as possible or go to the Emergency Department if any problems should occur. ? ?Please show the CHEMOTHERAPY ALERT CARD or IMMUNOTHERAPY ALERT CARD at  check-in to the Emergency Department and triage nurse. ? ?Should you have questions after your visit or need to cancel or reschedule your appointment, please contact [facility]  Dept: [phone]  and follow the prompts.  Office hours are 8:00 a.m. to 4:30 p.m. Monday - Friday. Please note that voicemails left after 4:00 p.m. may not be returned until the following business day.  We are closed weekends and major holidays. You have access to a nurse at all times for urgent questions. Please call the main number to the clinic Dept: [phone] and follow the prompts. ? ? ?For any non-urgent questions, you may also contact your provider using MyChart. We now offer e-Visits for anyone 4 and older to request care online for non-urgent symptoms. For details visit [email or web address]. ?  ?Also download the MyChart app! Go to the app store, search "MyChart", open the app, select [facility], and log in with your MyChart username and password. ? ?Due to Covid, a mask is required upon entering the hospital/clinic. If you do not have a mask, one will be given to you upon arrival. For doctor visits, patients may have 1 support person aged 52 or older with them. For treatment visits, patients cannot have anyone with them due to current Covid guidelines and our immunocompromised population.  ? ?

## 2021-09-07 NOTE — Progress Notes (Signed)
Per Lindi Adie MD, ok to treat today with Echo from 04/30/21. EF 55-60%. ?

## 2021-09-08 ENCOUNTER — Other Ambulatory Visit: Payer: Self-pay

## 2021-09-08 ENCOUNTER — Ambulatory Visit
Admission: RE | Admit: 2021-09-08 | Discharge: 2021-09-08 | Disposition: A | Discharge status: Home / Self Care | Payer: Medicare Other | Source: Ambulatory Visit | Attending: Radiation Oncology | Admitting: Radiation Oncology

## 2021-09-08 DIAGNOSIS — Z510 Encounter for antineoplastic radiation therapy: Secondary | ICD-10-CM | POA: Diagnosis not present

## 2021-09-08 LAB — RAD ONC ARIA SESSION SUMMARY
Course Elapsed Days: 13
Plan Fractions Treated to Date: 10
Plan Fractions Treated to Date: 5
Plan Prescribed Dose Per Fraction: 1.8 Gy
Plan Prescribed Dose Per Fraction: 1.8 Gy
Plan Total Fractions Prescribed: 14
Plan Total Fractions Prescribed: 28
Plan Total Prescribed Dose: 25.2 Gy
Plan Total Prescribed Dose: 50.4 Gy
Reference Point Dosage Given to Date: 18 Gy
Reference Point Dosage Given to Date: 18 Gy
Reference Point Session Dosage Given: 1.8 Gy
Reference Point Session Dosage Given: 1.8 Gy
Session Number: 10

## 2021-09-09 ENCOUNTER — Ambulatory Visit
Admission: RE | Admit: 2021-09-09 | Discharge: 2021-09-09 | Disposition: A | Discharge status: Home / Self Care | Payer: Medicare Other | Source: Ambulatory Visit | Attending: Radiation Oncology | Admitting: Radiation Oncology

## 2021-09-09 ENCOUNTER — Other Ambulatory Visit: Payer: Self-pay

## 2021-09-09 DIAGNOSIS — Z510 Encounter for antineoplastic radiation therapy: Secondary | ICD-10-CM | POA: Diagnosis not present

## 2021-09-09 LAB — RAD ONC ARIA SESSION SUMMARY
Course Elapsed Days: 14
Plan Fractions Treated to Date: 11
Plan Fractions Treated to Date: 6
Plan Prescribed Dose Per Fraction: 1.8 Gy
Plan Prescribed Dose Per Fraction: 1.8 Gy
Plan Total Fractions Prescribed: 14
Plan Total Fractions Prescribed: 28
Plan Total Prescribed Dose: 25.2 Gy
Plan Total Prescribed Dose: 50.4 Gy
Reference Point Dosage Given to Date: 19.8 Gy
Reference Point Dosage Given to Date: 19.8 Gy
Reference Point Session Dosage Given: 1.8 Gy
Reference Point Session Dosage Given: 1.8 Gy
Session Number: 11

## 2021-09-10 ENCOUNTER — Other Ambulatory Visit: Payer: Self-pay

## 2021-09-10 ENCOUNTER — Ambulatory Visit
Admission: RE | Admit: 2021-09-10 | Discharge: 2021-09-10 | Disposition: A | Discharge status: Home / Self Care | Payer: Medicare Other | Source: Ambulatory Visit | Attending: Radiation Oncology | Admitting: Radiation Oncology

## 2021-09-10 DIAGNOSIS — Z510 Encounter for antineoplastic radiation therapy: Secondary | ICD-10-CM | POA: Diagnosis not present

## 2021-09-10 LAB — RAD ONC ARIA SESSION SUMMARY
Course Elapsed Days: 15
Plan Fractions Treated to Date: 12
Plan Fractions Treated to Date: 6
Plan Prescribed Dose Per Fraction: 1.8 Gy
Plan Prescribed Dose Per Fraction: 1.8 Gy
Plan Total Fractions Prescribed: 14
Plan Total Fractions Prescribed: 28
Plan Total Prescribed Dose: 25.2 Gy
Plan Total Prescribed Dose: 50.4 Gy
Reference Point Dosage Given to Date: 21.6 Gy
Reference Point Dosage Given to Date: 21.6 Gy
Reference Point Session Dosage Given: 1.8 Gy
Reference Point Session Dosage Given: 1.8 Gy
Session Number: 12

## 2021-09-13 ENCOUNTER — Ambulatory Visit
Admission: RE | Admit: 2021-09-13 | Discharge: 2021-09-13 | Disposition: A | Discharge status: Home / Self Care | Payer: Medicare Other | Source: Ambulatory Visit | Attending: Radiation Oncology | Admitting: Radiation Oncology

## 2021-09-13 ENCOUNTER — Other Ambulatory Visit: Payer: Self-pay

## 2021-09-13 DIAGNOSIS — Z510 Encounter for antineoplastic radiation therapy: Secondary | ICD-10-CM | POA: Diagnosis not present

## 2021-09-13 LAB — RAD ONC ARIA SESSION SUMMARY
Course Elapsed Days: 18
Plan Fractions Treated to Date: 13
Plan Fractions Treated to Date: 7
Plan Prescribed Dose Per Fraction: 1.8 Gy
Plan Prescribed Dose Per Fraction: 1.8 Gy
Plan Total Fractions Prescribed: 14
Plan Total Fractions Prescribed: 28
Plan Total Prescribed Dose: 25.2 Gy
Plan Total Prescribed Dose: 50.4 Gy
Reference Point Dosage Given to Date: 23.4 Gy
Reference Point Dosage Given to Date: 23.4 Gy
Reference Point Session Dosage Given: 1.8 Gy
Reference Point Session Dosage Given: 1.8 Gy
Session Number: 13

## 2021-09-14 ENCOUNTER — Ambulatory Visit
Admission: RE | Admit: 2021-09-14 | Discharge: 2021-09-14 | Disposition: A | Discharge status: Home / Self Care | Payer: Medicare Other | Source: Ambulatory Visit | Attending: Radiation Oncology | Admitting: Radiation Oncology

## 2021-09-14 ENCOUNTER — Other Ambulatory Visit: Payer: Self-pay

## 2021-09-14 DIAGNOSIS — Z510 Encounter for antineoplastic radiation therapy: Secondary | ICD-10-CM | POA: Diagnosis not present

## 2021-09-14 LAB — RAD ONC ARIA SESSION SUMMARY
Course Elapsed Days: 19
Plan Fractions Treated to Date: 14
Plan Fractions Treated to Date: 7
Plan Prescribed Dose Per Fraction: 1.8 Gy
Plan Prescribed Dose Per Fraction: 1.8 Gy
Plan Total Fractions Prescribed: 14
Plan Total Fractions Prescribed: 28
Plan Total Prescribed Dose: 25.2 Gy
Plan Total Prescribed Dose: 50.4 Gy
Reference Point Dosage Given to Date: 25.2 Gy
Reference Point Dosage Given to Date: 25.2 Gy
Reference Point Session Dosage Given: 1.8 Gy
Reference Point Session Dosage Given: 1.8 Gy
Session Number: 14

## 2021-09-14 NOTE — Progress Notes (Signed)
+ ? ?Patient Care Team: ?Pcp, No as PCP - General ?Mauro Kaufmann, RN as Oncology Nurse Navigator ?Rockwell Germany, RN as Oncology Nurse Navigator ? ?DIAGNOSIS:  ?Encounter Diagnosis  ?Name Primary?  ? Malignant neoplasm of upper-outer quadrant of right breast in female, estrogen receptor negative ([place])   ? ? ?SUMMARY OF ONCOLOGIC HISTORY: ?Oncology History  ?Malignant neoplasm of upper-outer quadrant of right breast in female, estrogen receptor negative ([place])  ?11/17/2020 Initial Diagnosis  ? Work-up performed at [facility]: Palpable right breast mass.  Mammogram and ultrasound 11/10/2020: Spiculated 4.9 cm mass UOQ right breast with microcalcifications, biopsy revealed IDC ER/PR negative, HER2 positive 3+ by IHC, axillary lymph node positive (2 lymph nodes were detected) ?  ?12/03/2020 PET scan  ? Right breast malignancy SUV 4.6, subcentimeter right level 1 and 2 axillary lymph nodes.  No distant metastatic disease. ?  ?12/21/2020 Cancer Staging  ? Staging form: Breast, AJCC 8th Edition ?- Clinical stage from 12/21/2020: Stage IIB (cT2, cN1, cM0, G3, ER-, PR-, HER2+) - Signed by Nicholas Lose, MD on 12/21/2020 ?Stage prefix: Initial diagnosis ?Histologic grading system: 3 grade system ? ?  ?12/29/2020 -  Chemotherapy  ? Patient is on Treatment Plan : BREAST  Docetaxel + Carboplatin + Trastuzumab + Pertuzumab  (TCHP) q21d   ? ?  ?  ?06/29/2021 Surgery  ? Right mastectomy: No residual cancer identified, 0/4 lymph nodes negative, complete pathologic response ?  ?08/27/2021 - 10/12/2021 Radiation Therapy  ? Adjuvant radiation ?  ? ? ?CHIEF COMPLIANT: Herceptin and Perjeta ?  ? ?INTERVAL HISTORY: Marie Day is a 76 y.o. with above-mentioned history of invasive ductal carcinoma of the right breast, completed chemotherapy with [facility]. She presents to the clinic today for Herceptin and Perjeta maintenance. ?She still complain of joints and aches. She states that she has been trying to walk. She states that she haven't  been able to sleep from the radiation. She states that she is really tired today. ? ?ALLERGIES:  is allergic to sulfa antibiotics. ? ?MEDICATIONS:  ?Current Outpatient Medications  ?Medication Sig Dispense Refill  ? Acetaminophen (TYLENOL 8 HOUR ARTHRITIS PAIN PO) Take by mouth.    ? hydrOXYzine (VISTARIL) 25 MG capsule Take 1 capsule (25 mg total) by mouth at bedtime as needed for up to 30 doses (Difficulty sleeping). (Patient not taking: Reported on 09/28/2021) 30 capsule 0  ? lidocaine-prilocaine (EMLA) cream Apply to affected area once 30 g 3  ? ondansetron (ZOFRAN) 8 MG tablet Take 1 tablet (8 mg total) by mouth 2 (two) times daily as needed (Nausea or vomiting). Start on the third day after chemotherapy. 30 tablet 1  ? prochlorperazine (COMPAZINE) 10 MG tablet TAKE 1 TABLET (10 MG TOTAL) BY MOUTH EVERY 6 (SIX) HOURS AS NEEDED (NAUSEA OR VOMITING). 30 tablet 1  ? ?No current facility-administered medications for this visit.  ? ?Facility-Administered Medications Ordered in Other Visits  ?Medication Dose Route Frequency Provider Last Rate Last Admin  ? sodium chloride flush (NS) 0.9 % injection 10 mL  10 mL Intracatheter PRN Nicholas Lose, MD   10 mL at 09/28/21 1524  ? ? ?PHYSICAL EXAMINATION: ?ECOG PERFORMANCE STATUS: 1 - Symptomatic but completely ambulatory ? ?Vitals:  ? 09/28/21 1141  ?BP: (!) 109/54  ?Pulse: 85  ?Resp: 18  ?Temp: 97.9 ?F (36.6 ?C)  ?SpO2: 100%  ? ?Filed Weights  ? 09/28/21 1141  ?Weight: 143 lb 11.2 oz (65.2 kg)  ?  ? ?LABORATORY DATA:  ?I  have reviewed the data as listed ? ?  Latest Ref Rng & Units 09/28/2021  ? 11:26 AM 08/17/2021  ? 11:21 AM 07/06/2021  ? 10:49 AM  ?CMP  ?Glucose 70 - 99 mg/dL 117   99   123    ?BUN 8 - 23 mg/dL $Remove'10   10   12    'SnztQvr$ ?Creatinine 0.44 - 1.00 mg/dL 0.54   0.78   0.74    ?Sodium 135 - 145 mmol/L 141   140   139    ?Potassium 3.5 - 5.1 mmol/L 3.8   4.3   3.4    ?Chloride 98 - 111 mmol/L 105   106   106    ?CO2 22 - 32 mmol/L $RemoveB'26   28   27    'xCfyHxOP$ ?Calcium 8.9 - 10.3 mg/dL 9.4    9.4   9.2    ?Total Protein 6.5 - 8.1 g/dL 6.9   6.6   6.7    ?Total Bilirubin 0.3 - 1.2 mg/dL 0.5   0.4   0.2    ?Alkaline Phos 38 - 126 U/L 93   96   90    ?AST 15 - 41 U/L $Remo'15   14   17    'dIJPa$ ?ALT 0 - 44 U/L $Remo'13   11   14    'mqXKn$ ? ? ?Lab Results  ?Component Value Date  ? WBC 5.6 09/28/2021  ? HGB 10.2 (L) 09/28/2021  ? HCT 31.2 (L) 09/28/2021  ? MCV 87.4 09/28/2021  ? PLT 247 09/28/2021  ? NEUTROABS 3.3 09/28/2021  ? ? ?ASSESSMENT & PLAN:  ?Malignant neoplasm of upper-outer quadrant of right breast in female, estrogen receptor negative ([place]) ?11/17/2020:Work-up performed at [facility]: Palpable right breast mass.  Mammogram and ultrasound 11/10/2020: Spiculated 4.9 cm mass UOQ right breast with microcalcifications, biopsy revealed IDC ER/PR negative, HER2 positive 3+ by IHC, axillary lymph node positive (2 lymph nodes were detected) ?  ?Genetics were negative ?  ?Treatment Plan based on multidisciplinary tumor board: ?1. Neoadjuvant chemotherapy with [place] Perjeta ?6 cycles (started 12/29/2020-04/13/2021) followed by Herceptin Perjeta maintenance  for 1 year ?2. 06/29/2021:Right mastectomy: No residual cancer identified, 0/4 lymph nodes negative, complete pathologic response ?3. Followed by adjuvant radiation therapy 08/27/2021-10/12/2021 ?------------------------------------------------------------------------------------------------------------------------- ?Current treatment: Herceptin Perjeta maintenance (until 12/21/2021) ?Toxicities: None  ?Ongoing radiation: Until 10/12/2021 ?  ?Diffuse arthralgias and myalgias: The symptoms have come back since chemotherapy has been discontinued ?Return to clinic every 3 weeks for Herceptin Perjeta and every 6 weeks to follow-up with me. ? ? ? ?No orders of the defined types were placed in this encounter. ? ?The patient has a good understanding of the overall plan. she agrees with it. she will call with any problems that may develop before the next visit here. ?Total time spent: 30  mins including face to face time and time spent for planning, charting and co-ordination of care ? ? Harriette Ohara, MD ?09/28/21 ? ? ? I Gardiner Coins am scribing for Dr. Lindi Adie ? ?I have reviewed the above documentation for accuracy and completeness, and I agree with the above. ?  ?

## 2021-09-15 ENCOUNTER — Other Ambulatory Visit: Payer: Self-pay

## 2021-09-15 ENCOUNTER — Ambulatory Visit
Admission: RE | Admit: 2021-09-15 | Discharge: 2021-09-15 | Disposition: A | Discharge status: Home / Self Care | Payer: Medicare Other | Source: Ambulatory Visit | Attending: Radiation Oncology | Admitting: Radiation Oncology

## 2021-09-15 DIAGNOSIS — Z510 Encounter for antineoplastic radiation therapy: Secondary | ICD-10-CM | POA: Diagnosis not present

## 2021-09-15 LAB — RAD ONC ARIA SESSION SUMMARY
Course Elapsed Days: 20
Plan Fractions Treated to Date: 15
Plan Fractions Treated to Date: 8
Plan Prescribed Dose Per Fraction: 1.8 Gy
Plan Prescribed Dose Per Fraction: 1.8 Gy
Plan Total Fractions Prescribed: 14
Plan Total Fractions Prescribed: 28
Plan Total Prescribed Dose: 25.2 Gy
Plan Total Prescribed Dose: 50.4 Gy
Reference Point Dosage Given to Date: 27 Gy
Reference Point Dosage Given to Date: 27 Gy
Reference Point Session Dosage Given: 1.8 Gy
Reference Point Session Dosage Given: 1.8 Gy
Session Number: 15

## 2021-09-16 ENCOUNTER — Other Ambulatory Visit: Payer: Self-pay

## 2021-09-16 ENCOUNTER — Ambulatory Visit (HOSPITAL_COMMUNITY)
Admission: RE | Admit: 2021-09-16 | Discharge: 2021-09-16 | Disposition: A | Discharge status: Home / Self Care | Payer: Medicare Other | Source: Ambulatory Visit | Attending: Hematology and Oncology | Admitting: Hematology and Oncology

## 2021-09-16 ENCOUNTER — Ambulatory Visit
Admission: RE | Admit: 2021-09-16 | Discharge: 2021-09-16 | Disposition: A | Discharge status: Home / Self Care | Payer: Medicare Other | Source: Ambulatory Visit | Attending: Radiation Oncology | Admitting: Radiation Oncology

## 2021-09-16 DIAGNOSIS — Z0189 Encounter for other specified special examinations: Secondary | ICD-10-CM | POA: Diagnosis not present

## 2021-09-16 DIAGNOSIS — Z79899 Other long term (current) drug therapy: Secondary | ICD-10-CM | POA: Diagnosis not present

## 2021-09-16 DIAGNOSIS — Z510 Encounter for antineoplastic radiation therapy: Secondary | ICD-10-CM | POA: Diagnosis not present

## 2021-09-16 DIAGNOSIS — Z5181 Encounter for therapeutic drug level monitoring: Secondary | ICD-10-CM | POA: Diagnosis present

## 2021-09-16 LAB — RAD ONC ARIA SESSION SUMMARY
Course Elapsed Days: 21
Plan Fractions Treated to Date: 16
Plan Fractions Treated to Date: 8
Plan Prescribed Dose Per Fraction: 1.8 Gy
Plan Prescribed Dose Per Fraction: 1.8 Gy
Plan Total Fractions Prescribed: 14
Plan Total Fractions Prescribed: 28
Plan Total Prescribed Dose: 25.2 Gy
Plan Total Prescribed Dose: 50.4 Gy
Reference Point Dosage Given to Date: 28.8 Gy
Reference Point Dosage Given to Date: 28.8 Gy
Reference Point Session Dosage Given: 1.8 Gy
Reference Point Session Dosage Given: 1.8 Gy
Session Number: 16

## 2021-09-16 LAB — ECHOCARDIOGRAM COMPLETE
AR max vel: 2.11 cm2
AV Peak grad: 7.4 mmHg
Ao pk vel: 1.36 m/s
Area-P 1/2: 3.58 cm2
Calc EF: 57.1 %
S' Lateral: 3.2 cm
Single Plane A2C EF: 58 %
Single Plane A4C EF: 57.4 %

## 2021-09-17 ENCOUNTER — Ambulatory Visit
Admission: RE | Admit: 2021-09-17 | Discharge: 2021-09-17 | Disposition: A | Discharge status: Home / Self Care | Payer: Medicare Other | Source: Ambulatory Visit | Attending: Radiation Oncology | Admitting: Radiation Oncology

## 2021-09-17 ENCOUNTER — Other Ambulatory Visit: Payer: Self-pay

## 2021-09-17 DIAGNOSIS — Z510 Encounter for antineoplastic radiation therapy: Secondary | ICD-10-CM | POA: Diagnosis not present

## 2021-09-17 LAB — RAD ONC ARIA SESSION SUMMARY
Course Elapsed Days: 22
Plan Fractions Treated to Date: 17
Plan Fractions Treated to Date: 9
Plan Prescribed Dose Per Fraction: 1.8 Gy
Plan Prescribed Dose Per Fraction: 1.8 Gy
Plan Total Fractions Prescribed: 14
Plan Total Fractions Prescribed: 28
Plan Total Prescribed Dose: 25.2 Gy
Plan Total Prescribed Dose: 50.4 Gy
Reference Point Dosage Given to Date: 30.6 Gy
Reference Point Dosage Given to Date: 30.6 Gy
Reference Point Session Dosage Given: 1.8 Gy
Reference Point Session Dosage Given: 1.8 Gy
Session Number: 17

## 2021-09-20 ENCOUNTER — Other Ambulatory Visit: Payer: Self-pay

## 2021-09-20 ENCOUNTER — Ambulatory Visit
Admission: RE | Admit: 2021-09-20 | Discharge: 2021-09-20 | Disposition: A | Discharge status: Home / Self Care | Payer: Medicare (Managed Care) | Source: Ambulatory Visit | Attending: Radiation Oncology | Admitting: Radiation Oncology

## 2021-09-20 DIAGNOSIS — Z510 Encounter for antineoplastic radiation therapy: Secondary | ICD-10-CM | POA: Insufficient documentation

## 2021-09-20 DIAGNOSIS — Z5112 Encounter for antineoplastic immunotherapy: Secondary | ICD-10-CM | POA: Insufficient documentation

## 2021-09-20 DIAGNOSIS — Z5189 Encounter for other specified aftercare: Secondary | ICD-10-CM | POA: Insufficient documentation

## 2021-09-20 DIAGNOSIS — Z9011 Acquired absence of right breast and nipple: Secondary | ICD-10-CM | POA: Diagnosis not present

## 2021-09-20 DIAGNOSIS — Z171 Estrogen receptor negative status [ER-]: Secondary | ICD-10-CM | POA: Insufficient documentation

## 2021-09-20 DIAGNOSIS — C50411 Malignant neoplasm of upper-outer quadrant of right female breast: Secondary | ICD-10-CM | POA: Diagnosis not present

## 2021-09-20 DIAGNOSIS — M797 Fibromyalgia: Secondary | ICD-10-CM | POA: Diagnosis not present

## 2021-09-20 DIAGNOSIS — Z923 Personal history of irradiation: Secondary | ICD-10-CM | POA: Diagnosis not present

## 2021-09-20 LAB — RAD ONC ARIA SESSION SUMMARY
Course Elapsed Days: 25
Plan Fractions Treated to Date: 18
Plan Fractions Treated to Date: 9
Plan Prescribed Dose Per Fraction: 1.8 Gy
Plan Prescribed Dose Per Fraction: 1.8 Gy
Plan Total Fractions Prescribed: 14
Plan Total Fractions Prescribed: 28
Plan Total Prescribed Dose: 25.2 Gy
Plan Total Prescribed Dose: 50.4 Gy
Reference Point Dosage Given to Date: 32.4 Gy
Reference Point Dosage Given to Date: 32.4 Gy
Reference Point Session Dosage Given: 1.8 Gy
Reference Point Session Dosage Given: 1.8 Gy
Session Number: 18

## 2021-09-21 ENCOUNTER — Ambulatory Visit
Admission: RE | Admit: 2021-09-21 | Discharge: 2021-09-21 | Disposition: A | Discharge status: Home / Self Care | Payer: Medicare (Managed Care) | Source: Ambulatory Visit | Attending: Radiation Oncology | Admitting: Radiation Oncology

## 2021-09-21 ENCOUNTER — Other Ambulatory Visit: Payer: Self-pay

## 2021-09-21 DIAGNOSIS — Z5112 Encounter for antineoplastic immunotherapy: Secondary | ICD-10-CM | POA: Diagnosis not present

## 2021-09-21 LAB — RAD ONC ARIA SESSION SUMMARY
Course Elapsed Days: 26
Plan Fractions Treated to Date: 10
Plan Fractions Treated to Date: 19
Plan Prescribed Dose Per Fraction: 1.8 Gy
Plan Prescribed Dose Per Fraction: 1.8 Gy
Plan Total Fractions Prescribed: 14
Plan Total Fractions Prescribed: 28
Plan Total Prescribed Dose: 25.2 Gy
Plan Total Prescribed Dose: 50.4 Gy
Reference Point Dosage Given to Date: 34.2 Gy
Reference Point Dosage Given to Date: 34.2 Gy
Reference Point Session Dosage Given: 1.8 Gy
Reference Point Session Dosage Given: 1.8 Gy
Session Number: 19

## 2021-09-22 ENCOUNTER — Ambulatory Visit
Admission: RE | Admit: 2021-09-22 | Discharge: 2021-09-22 | Disposition: A | Discharge status: Home / Self Care | Payer: Medicare (Managed Care) | Source: Ambulatory Visit | Attending: Radiation Oncology | Admitting: Radiation Oncology

## 2021-09-22 ENCOUNTER — Other Ambulatory Visit: Payer: Self-pay

## 2021-09-22 DIAGNOSIS — Z5112 Encounter for antineoplastic immunotherapy: Secondary | ICD-10-CM | POA: Diagnosis not present

## 2021-09-22 LAB — RAD ONC ARIA SESSION SUMMARY
Course Elapsed Days: 27
Plan Fractions Treated to Date: 10
Plan Fractions Treated to Date: 20
Plan Prescribed Dose Per Fraction: 1.8 Gy
Plan Prescribed Dose Per Fraction: 1.8 Gy
Plan Total Fractions Prescribed: 14
Plan Total Fractions Prescribed: 28
Plan Total Prescribed Dose: 25.2 Gy
Plan Total Prescribed Dose: 50.4 Gy
Reference Point Dosage Given to Date: 36 Gy
Reference Point Dosage Given to Date: 36 Gy
Reference Point Session Dosage Given: 1.8 Gy
Reference Point Session Dosage Given: 1.8 Gy
Session Number: 20

## 2021-09-23 ENCOUNTER — Ambulatory Visit
Admission: RE | Admit: 2021-09-23 | Discharge: 2021-09-23 | Disposition: A | Discharge status: Home / Self Care | Payer: Medicare (Managed Care) | Source: Ambulatory Visit | Attending: Radiation Oncology | Admitting: Radiation Oncology

## 2021-09-23 ENCOUNTER — Other Ambulatory Visit: Payer: Self-pay

## 2021-09-23 DIAGNOSIS — Z5112 Encounter for antineoplastic immunotherapy: Secondary | ICD-10-CM | POA: Diagnosis not present

## 2021-09-23 LAB — RAD ONC ARIA SESSION SUMMARY
Course Elapsed Days: 28
Plan Fractions Treated to Date: 11
Plan Fractions Treated to Date: 21
Plan Prescribed Dose Per Fraction: 1.8 Gy
Plan Prescribed Dose Per Fraction: 1.8 Gy
Plan Total Fractions Prescribed: 14
Plan Total Fractions Prescribed: 28
Plan Total Prescribed Dose: 25.2 Gy
Plan Total Prescribed Dose: 50.4 Gy
Reference Point Dosage Given to Date: 37.8 Gy
Reference Point Dosage Given to Date: 37.8 Gy
Reference Point Session Dosage Given: 1.8 Gy
Reference Point Session Dosage Given: 1.8 Gy
Session Number: 21

## 2021-09-24 ENCOUNTER — Ambulatory Visit: Payer: Medicare (Managed Care)

## 2021-09-24 ENCOUNTER — Ambulatory Visit: Payer: Medicare (Managed Care) | Admitting: Radiation Oncology

## 2021-09-27 ENCOUNTER — Ambulatory Visit
Admission: RE | Admit: 2021-09-27 | Discharge: 2021-09-27 | Disposition: A | Discharge status: Home / Self Care | Payer: Medicare (Managed Care) | Source: Ambulatory Visit | Attending: Radiation Oncology | Admitting: Radiation Oncology

## 2021-09-27 ENCOUNTER — Ambulatory Visit: Payer: PRIVATE HEALTH INSURANCE | Attending: Surgery

## 2021-09-27 ENCOUNTER — Other Ambulatory Visit: Payer: Self-pay

## 2021-09-27 VITALS — Wt 144.5 lb

## 2021-09-27 DIAGNOSIS — Z5112 Encounter for antineoplastic immunotherapy: Secondary | ICD-10-CM | POA: Diagnosis not present

## 2021-09-27 DIAGNOSIS — Z483 Aftercare following surgery for neoplasm: Secondary | ICD-10-CM | POA: Insufficient documentation

## 2021-09-27 LAB — RAD ONC ARIA SESSION SUMMARY
Course Elapsed Days: 32
Plan Fractions Treated to Date: 11
Plan Fractions Treated to Date: 22
Plan Prescribed Dose Per Fraction: 1.8 Gy
Plan Prescribed Dose Per Fraction: 1.8 Gy
Plan Total Fractions Prescribed: 14
Plan Total Fractions Prescribed: 28
Plan Total Prescribed Dose: 25.2 Gy
Plan Total Prescribed Dose: 50.4 Gy
Reference Point Dosage Given to Date: 39.6 Gy
Reference Point Dosage Given to Date: 39.6 Gy
Reference Point Session Dosage Given: 1.8 Gy
Reference Point Session Dosage Given: 1.8 Gy
Session Number: 22

## 2021-09-27 NOTE — Therapy (Signed)
?  OUTPATIENT PHYSICAL THERAPY SOZO SCREENING NOTE ? ? ?Patient Name: Marie Day ?MRN: [id number] ?DOB:1945-09-14, 76 y.o., female ?Today's Date: 09/27/2021 ? ?PCP: Pcp, No ?REFERRING PROVIDER: Erroll Luna, MD ? ? PT End of Session - 09/27/21 1330   ? ? Visit Number 10   # unchanged due to screen only  ? PT Start Time 1328   ? PT Stop Time 3383   ? PT Time Calculation (min) 11 min   ? Activity Tolerance Patient tolerated treatment well   ? Behavior During Therapy [facility] for tasks assessed/performed   ? ?  ?  ? ?  ? ? ?Past Medical History:  ?Diagnosis Date  ? Arthritis   ? Cancer [facility])   ? ?Past Surgical History:  ?Procedure Laterality Date  ? BREAST RECONSTRUCTION WITH PLACEMENT OF TISSUE EXPANDER AND FLEX HD (ACELLULAR HYDRATED DERMIS) Right 06/29/2021  ? Procedure: RIGHT BREAST RECONSTRUCTION WITH PLACEMENT OF TISSUE EXPANDER AND FLEX HD (ACELLULAR HYDRATED DERMIS);  Surgeon: Cindra Presume, MD;  Location: [facility];  Service: Plastics;  Laterality: Right;  ? MASTECTOMY W/ SENTINEL NODE BIOPSY Right 06/29/2021  ? Procedure: RIGHT MASTECTOMY WITH SENTINEL LYMPH NODE BIOPSY;  Surgeon: Erroll Luna, MD;  Location: [place];  Service: General;  Laterality: Right;  ? WISDOM TOOTH EXTRACTION    ? ?Patient Active Problem List  ? Diagnosis Date Noted  ? S/P breast reconstruction 06/29/2021  ? Port-A-Cath in place 01/18/2021  ? Malignant neoplasm of upper-outer quadrant of right breast in female, estrogen receptor negative ([place]) 12/21/2020  ? Anemia 10/27/2020  ? Prediabetes 10/27/2020  ? 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  ? Unspecified age-related cataract 12/14/2015  ? ? ?REFERRING DIAG: right breast cancer at risk for lymphedema ? ?THERAPY DIAG:  ?Aftercare following surgery for neoplasm ? ?PERTINENT HISTORY: [name] with expander on 06/29/21 with 0/4 LN positive. Drains removed 07/15/21. Will be having radiation consult 07/27/21  and will continue HP maintenance  every 3 weeks.  ? ?PRECAUTIONS: right UE Lymphedema risk, None ? ?SUBJECTIVE: Pt returns for her 3 month L-Dex screen. ? ?PAIN:  ?Are you having pain? No ? ?SOZO SCREENING: ?Patient was assessed today using the SOZO machine to determine the lymphedema index score. This was compared to her baseline score. It was determined that she is within the recommended range when compared to her baseline and no further action is needed at this time. She will continue SOZO screenings. These are done every 3 months for 2 years post operatively followed by every 6 months for 2 years, and then annually. ? ? ? ?Otelia Limes, PTA ?09/27/2021, 1:39 PM ? ?  ? ?

## 2021-09-28 ENCOUNTER — Other Ambulatory Visit: Payer: Self-pay

## 2021-09-28 ENCOUNTER — Other Ambulatory Visit: Payer: Self-pay | Admitting: *Deleted

## 2021-09-28 ENCOUNTER — Inpatient Hospital Stay: Payer: Medicare (Managed Care) | Attending: Hematology and Oncology

## 2021-09-28 ENCOUNTER — Inpatient Hospital Stay: Payer: Medicare (Managed Care)

## 2021-09-28 ENCOUNTER — Ambulatory Visit
Admission: RE | Admit: 2021-09-28 | Discharge: 2021-09-28 | Disposition: A | Discharge status: Home / Self Care | Payer: Medicare (Managed Care) | Source: Ambulatory Visit | Attending: Radiation Oncology | Admitting: Radiation Oncology

## 2021-09-28 ENCOUNTER — Inpatient Hospital Stay (HOSPITAL_BASED_OUTPATIENT_CLINIC_OR_DEPARTMENT_OTHER): Payer: Medicare (Managed Care) | Admitting: Hematology and Oncology

## 2021-09-28 DIAGNOSIS — Z923 Personal history of irradiation: Secondary | ICD-10-CM | POA: Insufficient documentation

## 2021-09-28 DIAGNOSIS — Z5112 Encounter for antineoplastic immunotherapy: Secondary | ICD-10-CM | POA: Diagnosis not present

## 2021-09-28 DIAGNOSIS — Z79899 Other long term (current) drug therapy: Secondary | ICD-10-CM

## 2021-09-28 DIAGNOSIS — Z171 Estrogen receptor negative status [ER-]: Secondary | ICD-10-CM

## 2021-09-28 DIAGNOSIS — Z95828 Presence of other vascular implants and grafts: Secondary | ICD-10-CM

## 2021-09-28 DIAGNOSIS — Z5189 Encounter for other specified aftercare: Secondary | ICD-10-CM | POA: Insufficient documentation

## 2021-09-28 DIAGNOSIS — M797 Fibromyalgia: Secondary | ICD-10-CM | POA: Insufficient documentation

## 2021-09-28 DIAGNOSIS — C50411 Malignant neoplasm of upper-outer quadrant of right female breast: Secondary | ICD-10-CM

## 2021-09-28 DIAGNOSIS — Z510 Encounter for antineoplastic radiation therapy: Secondary | ICD-10-CM | POA: Insufficient documentation

## 2021-09-28 DIAGNOSIS — Z9011 Acquired absence of right breast and nipple: Secondary | ICD-10-CM | POA: Insufficient documentation

## 2021-09-28 LAB — CBC WITH DIFFERENTIAL (CANCER CENTER ONLY)
Abs Immature Granulocytes: 0.02 10*3/uL (ref 0.00–0.07)
Basophils Absolute: 0 10*3/uL (ref 0.0–0.1)
Basophils Relative: 1 %
Eosinophils Absolute: 0.2 10*3/uL (ref 0.0–0.5)
Eosinophils Relative: 4 %
HCT: 31.2 % — ABNORMAL LOW (ref 36.0–46.0)
Hemoglobin: 10.2 g/dL — ABNORMAL LOW (ref 12.0–15.0)
Immature Granulocytes: 0 %
Lymphocytes Relative: 24 %
Lymphs Abs: 1.4 10*3/uL (ref 0.7–4.0)
MCH: 28.6 pg (ref 26.0–34.0)
MCHC: 32.7 g/dL (ref 30.0–36.0)
MCV: 87.4 fL (ref 80.0–100.0)
Monocytes Absolute: 0.6 10*3/uL (ref 0.1–1.0)
Monocytes Relative: 11 %
Neutro Abs: 3.3 10*3/uL (ref 1.7–7.7)
Neutrophils Relative %: 60 %
Platelet Count: 247 10*3/uL (ref 150–400)
RBC: 3.57 MIL/uL — ABNORMAL LOW (ref 3.87–5.11)
RDW: 14.6 % (ref 11.5–15.5)
WBC Count: 5.6 10*3/uL (ref 4.0–10.5)
nRBC: 0 % (ref 0.0–0.2)

## 2021-09-28 LAB — CMP (CANCER CENTER ONLY)
ALT: 13 U/L (ref 0–44)
AST: 15 U/L (ref 15–41)
Albumin: 3.8 g/dL (ref 3.5–5.0)
Alkaline Phosphatase: 93 U/L (ref 38–126)
Anion gap: 10 (ref 5–15)
BUN: 10 mg/dL (ref 8–23)
CO2: 26 mmol/L (ref 22–32)
Calcium: 9.4 mg/dL (ref 8.9–10.3)
Chloride: 105 mmol/L (ref 98–111)
Creatinine: 0.54 mg/dL (ref 0.44–1.00)
GFR, Estimated: >60 mL/min (ref >60)
Glucose, Bld: 117 mg/dL — ABNORMAL HIGH (ref 70–99)
Potassium: 3.8 mmol/L (ref 3.5–5.1)
Sodium: 141 mmol/L (ref 135–145)
Total Bilirubin: 0.5 mg/dL (ref 0.3–1.2)
Total Protein: 6.9 g/dL (ref 6.5–8.1)

## 2021-09-28 LAB — RAD ONC ARIA SESSION SUMMARY
Course Elapsed Days: 33
Plan Fractions Treated to Date: 12
Plan Fractions Treated to Date: 23
Plan Prescribed Dose Per Fraction: 1.8 Gy
Plan Prescribed Dose Per Fraction: 1.8 Gy
Plan Total Fractions Prescribed: 14
Plan Total Fractions Prescribed: 28
Plan Total Prescribed Dose: 25.2 Gy
Plan Total Prescribed Dose: 50.4 Gy
Reference Point Dosage Given to Date: 41.4 Gy
Reference Point Dosage Given to Date: 41.4 Gy
Reference Point Session Dosage Given: 1.8 Gy
Reference Point Session Dosage Given: 1.8 Gy
Session Number: 23

## 2021-09-28 MED ORDER — SODIUM CHLORIDE 0.9% FLUSH
10.0000 mL | Freq: Once | INTRAVENOUS | Status: AC
  Administered 2021-09-28: 10 mL

## 2021-09-28 MED ORDER — TRASTUZUMAB-DKST CHEMO 150 MG IV SOLR
400.0000 mg | Freq: Once | INTRAVENOUS | Status: AC
  Administered 2021-09-28: 400 mg via INTRAVENOUS
  Filled 2021-09-28: qty 19.05

## 2021-09-28 MED ORDER — DIPHENHYDRAMINE HCL 25 MG PO CAPS
50.0000 mg | ORAL_CAPSULE | Freq: Once | ORAL | Status: AC
  Administered 2021-09-28: 50 mg via ORAL
  Filled 2021-09-28: qty 2

## 2021-09-28 MED ORDER — HEPARIN SOD (PORK) LOCK FLUSH 100 UNIT/ML IV SOLN
500.0000 [IU] | Freq: Once | INTRAVENOUS | Status: AC | PRN
  Administered 2021-09-28: 500 [IU]

## 2021-09-28 MED ORDER — SODIUM CHLORIDE 0.9 % IV SOLN: Freq: Once | INTRAVENOUS | Status: AC

## 2021-09-28 MED ORDER — ACETAMINOPHEN 325 MG PO TABS
650.0000 mg | ORAL_TABLET | Freq: Once | ORAL | Status: AC
  Administered 2021-09-28: 650 mg via ORAL
  Filled 2021-09-28: qty 2

## 2021-09-28 MED ORDER — SODIUM CHLORIDE 0.9% FLUSH
10.0000 mL | INTRAVENOUS | Status: DC | PRN
  Administered 2021-09-28: 10 mL

## 2021-09-28 MED ORDER — SODIUM CHLORIDE 0.9 % IV SOLN
420.0000 mg | Freq: Once | INTRAVENOUS | Status: AC
  Administered 2021-09-28: 420 mg via INTRAVENOUS
  Filled 2021-09-28: qty 14

## 2021-09-28 NOTE — Assessment & Plan Note (Signed)
11/17/2020:Work-up performed at [facility]: Palpable right breast mass. ?Mammogram and ultrasound 11/10/2020: Spiculated 4.9 cm mass UOQ right breast with microcalcifications, biopsy revealed IDC ER/PR negative, HER2 positive 3+ by IHC, axillary lymph node positive (2 lymph nodes were detected) ?? ?Genetics were negative ?? ?Treatment Plan?based on multidisciplinary tumor board: ?1. Neoadjuvant chemotherapy with [place] Perjeta ?6 cycles?(started 12/29/2020-04/13/2021)?followed by Herceptin Perjeta maintenance ?for 1 year ?2.?06/29/2021:Right mastectomy: No residual cancer identified, 0/4 lymph nodes negative, complete pathologic response ?3. Followed by adjuvant radiation therapy 08/27/2021-10/12/2021 ?------------------------------------------------------------------------------------------------------------------------- ?Current treatment: Herceptin Perjeta maintenance ?Toxicities: None  ?Ongoing radiation ?? ?Diffuse arthralgias and myalgias: The symptoms have come back since chemotherapy has been discontinued ?Return to clinic every 3 weeks for Herceptin Perjeta and every 6 weeks to follow-up with me. ?

## 2021-09-28 NOTE — Patient Instructions (Signed)
[place] [place]  Discharge Instructions: ?Thank you for choosing [facility] to provide your oncology and hematology care.  ? ?If you have a lab appointment with the [place] [facility], please go directly to the [facility] and check in at the registration area. ?  ?Wear comfortable clothing and clothing appropriate for easy access to any Portacath or PICC line.  ? ?We strive to give you quality time with your provider. You may need to reschedule your appointment if you arrive late (15 or more minutes).  Arriving late affects you and other patients whose appointments are after yours.  Also, if you miss three or more appointments without notifying the office, you may be dismissed from the clinic at the provider?s discretion.    ?  ?For prescription refill requests, have your pharmacy contact our office and allow 72 hours for refills to be completed.   ? ?Today you received the following chemotherapy and/or immunotherapy agents: Ogivri, Pertuzumab.     ?  ?To help prevent nausea and vomiting after your treatment, we encourage you to take your nausea medication as directed. ? ?BELOW ARE SYMPTOMS THAT SHOULD BE REPORTED IMMEDIATELY: ?*FEVER GREATER THAN 100.4 F (38 ?C) OR HIGHER ?*CHILLS OR SWEATING ?*NAUSEA AND VOMITING THAT IS NOT CONTROLLED WITH YOUR NAUSEA MEDICATION ?*UNUSUAL SHORTNESS OF BREATH ?*UNUSUAL BRUISING OR BLEEDING ?*URINARY PROBLEMS (pain or burning when urinating, or frequent urination) ?*BOWEL PROBLEMS (unusual diarrhea, constipation, pain near the anus) ?TENDERNESS IN MOUTH AND THROAT WITH OR WITHOUT PRESENCE OF ULCERS (sore throat, sores in mouth, or a toothache) ?UNUSUAL RASH, SWELLING OR PAIN  ?UNUSUAL VAGINAL DISCHARGE OR ITCHING  ? ?Items with * indicate a potential emergency and should be followed up as soon as possible or go to the Emergency Department if any problems should occur. ? ?Please show the CHEMOTHERAPY ALERT CARD or IMMUNOTHERAPY ALERT CARD at  check-in to the Emergency Department and triage nurse. ? ?Should you have questions after your visit or need to cancel or reschedule your appointment, please contact [facility]  Dept: [phone]  and follow the prompts.  Office hours are 8:00 a.m. to 4:30 p.m. Monday - Friday. Please note that voicemails left after 4:00 p.m. may not be returned until the following business day.  We are closed weekends and major holidays. You have access to a nurse at all times for urgent questions. Please call the main number to the clinic Dept: [phone] and follow the prompts. ? ? ?For any non-urgent questions, you may also contact your provider using MyChart. We now offer e-Visits for anyone 12 and older to request care online for non-urgent symptoms. For details visit [email or web address]. ?  ?Also download the MyChart app! Go to the app store, search "MyChart", open the app, select [facility], and log in with your MyChart username and password. ? ?Due to Covid, a mask is required upon entering the hospital/clinic. If you do not have a mask, one will be given to you upon arrival. For doctor visits, patients may have 1 support person aged 62 or older with them. For treatment visits, patients cannot have anyone with them due to current Covid guidelines and our immunocompromised population.  ? ?

## 2021-09-28 NOTE — Progress Notes (Signed)
OK to treat with ECHO of 55-60%- Dr. Lindi Adie ? ?Patient stayed for her 30 minute observation- tolerated well. No issues ?

## 2021-09-29 ENCOUNTER — Ambulatory Visit
Admission: RE | Admit: 2021-09-29 | Discharge: 2021-09-29 | Disposition: A | Discharge status: Home / Self Care | Payer: Medicare (Managed Care) | Source: Ambulatory Visit | Attending: Radiation Oncology | Admitting: Radiation Oncology

## 2021-09-29 ENCOUNTER — Ambulatory Visit: Payer: Medicare (Managed Care) | Admitting: Radiation Oncology

## 2021-09-29 ENCOUNTER — Other Ambulatory Visit: Payer: Self-pay

## 2021-09-29 DIAGNOSIS — Z5112 Encounter for antineoplastic immunotherapy: Secondary | ICD-10-CM | POA: Diagnosis not present

## 2021-09-29 LAB — RAD ONC ARIA SESSION SUMMARY
Course Elapsed Days: 34
Plan Fractions Treated to Date: 12
Plan Fractions Treated to Date: 24
Plan Prescribed Dose Per Fraction: 1.8 Gy
Plan Prescribed Dose Per Fraction: 1.8 Gy
Plan Total Fractions Prescribed: 14
Plan Total Fractions Prescribed: 28
Plan Total Prescribed Dose: 25.2 Gy
Plan Total Prescribed Dose: 50.4 Gy
Reference Point Dosage Given to Date: 43.2 Gy
Reference Point Dosage Given to Date: 43.2 Gy
Reference Point Session Dosage Given: 1.8 Gy
Reference Point Session Dosage Given: 1.8 Gy
Session Number: 24

## 2021-09-30 ENCOUNTER — Ambulatory Visit: Payer: Medicare (Managed Care) | Admitting: Radiation Oncology

## 2021-09-30 ENCOUNTER — Ambulatory Visit
Admission: RE | Admit: 2021-09-30 | Discharge: 2021-09-30 | Disposition: A | Discharge status: Home / Self Care | Payer: Medicare (Managed Care) | Source: Ambulatory Visit | Attending: Radiation Oncology | Admitting: Radiation Oncology

## 2021-09-30 ENCOUNTER — Other Ambulatory Visit: Payer: Self-pay

## 2021-09-30 ENCOUNTER — Telehealth: Payer: Self-pay | Admitting: Hematology and Oncology

## 2021-09-30 DIAGNOSIS — Z5112 Encounter for antineoplastic immunotherapy: Secondary | ICD-10-CM | POA: Diagnosis not present

## 2021-09-30 LAB — RAD ONC ARIA SESSION SUMMARY
Course Elapsed Days: 35
Plan Fractions Treated to Date: 13
Plan Fractions Treated to Date: 25
Plan Prescribed Dose Per Fraction: 1.8 Gy
Plan Prescribed Dose Per Fraction: 1.8 Gy
Plan Total Fractions Prescribed: 14
Plan Total Fractions Prescribed: 28
Plan Total Prescribed Dose: 25.2 Gy
Plan Total Prescribed Dose: 50.4 Gy
Reference Point Dosage Given to Date: 45 Gy
Reference Point Dosage Given to Date: 45 Gy
Reference Point Session Dosage Given: 1.8 Gy
Reference Point Session Dosage Given: 1.8 Gy
Session Number: 25

## 2021-09-30 NOTE — Telephone Encounter (Signed)
Scheduled appointment per 5/10 los. Left message. ?

## 2021-10-01 ENCOUNTER — Ambulatory Visit: Payer: Medicare (Managed Care) | Admitting: Radiation Oncology

## 2021-10-01 ENCOUNTER — Ambulatory Visit
Admission: RE | Admit: 2021-10-01 | Discharge: 2021-10-01 | Disposition: A | Discharge status: Home / Self Care | Payer: Medicare (Managed Care) | Source: Ambulatory Visit | Attending: Radiation Oncology | Admitting: Radiation Oncology

## 2021-10-01 ENCOUNTER — Other Ambulatory Visit: Payer: Self-pay

## 2021-10-01 DIAGNOSIS — Z5112 Encounter for antineoplastic immunotherapy: Secondary | ICD-10-CM | POA: Diagnosis not present

## 2021-10-01 LAB — RAD ONC ARIA SESSION SUMMARY
Course Elapsed Days: 36
Plan Fractions Treated to Date: 13
Plan Fractions Treated to Date: 26
Plan Prescribed Dose Per Fraction: 1.8 Gy
Plan Prescribed Dose Per Fraction: 1.8 Gy
Plan Total Fractions Prescribed: 14
Plan Total Fractions Prescribed: 28
Plan Total Prescribed Dose: 25.2 Gy
Plan Total Prescribed Dose: 50.4 Gy
Reference Point Dosage Given to Date: 46.8 Gy
Reference Point Dosage Given to Date: 46.8 Gy
Reference Point Session Dosage Given: 1.8 Gy
Reference Point Session Dosage Given: 1.8 Gy
Session Number: 26

## 2021-10-02 ENCOUNTER — Encounter: Payer: Self-pay | Admitting: Hematology and Oncology

## 2021-10-04 ENCOUNTER — Ambulatory Visit
Admission: RE | Admit: 2021-10-04 | Discharge: 2021-10-04 | Disposition: A | Discharge status: Home / Self Care | Payer: Medicare (Managed Care) | Source: Ambulatory Visit | Attending: Radiation Oncology | Admitting: Radiation Oncology

## 2021-10-04 ENCOUNTER — Other Ambulatory Visit: Payer: Self-pay

## 2021-10-04 ENCOUNTER — Telehealth: Payer: Self-pay | Admitting: *Deleted

## 2021-10-04 DIAGNOSIS — Z5112 Encounter for antineoplastic immunotherapy: Secondary | ICD-10-CM | POA: Diagnosis not present

## 2021-10-04 LAB — RAD ONC ARIA SESSION SUMMARY
Course Elapsed Days: 39
Plan Fractions Treated to Date: 14
Plan Fractions Treated to Date: 27
Plan Prescribed Dose Per Fraction: 1.8 Gy
Plan Prescribed Dose Per Fraction: 1.8 Gy
Plan Total Fractions Prescribed: 14
Plan Total Fractions Prescribed: 28
Plan Total Prescribed Dose: 25.2 Gy
Plan Total Prescribed Dose: 50.4 Gy
Reference Point Dosage Given to Date: 48.6 Gy
Reference Point Dosage Given to Date: 48.6 Gy
Reference Point Session Dosage Given: 1.8 Gy
Reference Point Session Dosage Given: 1.8 Gy
Session Number: 27

## 2021-10-04 NOTE — Telephone Encounter (Signed)
Received VM from pt.  RN attempt x1 to return call.  No answer, LVM for pt to return call to the office.  °

## 2021-10-05 ENCOUNTER — Ambulatory Visit
Admission: RE | Admit: 2021-10-05 | Payer: Medicare (Managed Care) | Source: Ambulatory Visit | Admitting: Radiation Oncology

## 2021-10-05 ENCOUNTER — Other Ambulatory Visit: Payer: Self-pay

## 2021-10-05 ENCOUNTER — Ambulatory Visit
Admission: RE | Admit: 2021-10-05 | Discharge: 2021-10-05 | Disposition: A | Discharge status: Home / Self Care | Payer: Medicare (Managed Care) | Source: Ambulatory Visit | Attending: Radiation Oncology | Admitting: Radiation Oncology

## 2021-10-05 ENCOUNTER — Ambulatory Visit: Payer: Medicare (Managed Care)

## 2021-10-05 DIAGNOSIS — Z5112 Encounter for antineoplastic immunotherapy: Secondary | ICD-10-CM | POA: Diagnosis not present

## 2021-10-05 LAB — RAD ONC ARIA SESSION SUMMARY
Course Elapsed Days: 40
Plan Fractions Treated to Date: 14
Plan Fractions Treated to Date: 28
Plan Prescribed Dose Per Fraction: 1.8 Gy
Plan Prescribed Dose Per Fraction: 1.8 Gy
Plan Total Fractions Prescribed: 14
Plan Total Fractions Prescribed: 28
Plan Total Prescribed Dose: 25.2 Gy
Plan Total Prescribed Dose: 50.4 Gy
Reference Point Dosage Given to Date: 50.4 Gy
Reference Point Dosage Given to Date: 50.4 Gy
Reference Point Session Dosage Given: 1.8 Gy
Reference Point Session Dosage Given: 1.8 Gy
Session Number: 28

## 2021-10-06 ENCOUNTER — Ambulatory Visit: Admission: RE | Admit: 2021-10-06 | Payer: Medicare (Managed Care) | Source: Ambulatory Visit

## 2021-10-06 ENCOUNTER — Ambulatory Visit
Admission: RE | Admit: 2021-10-06 | Payer: Medicare (Managed Care) | Source: Ambulatory Visit | Admitting: Radiation Oncology

## 2021-10-06 ENCOUNTER — Ambulatory Visit: Payer: Medicare (Managed Care) | Admitting: Radiation Oncology

## 2021-10-06 ENCOUNTER — Other Ambulatory Visit: Payer: Self-pay

## 2021-10-06 ENCOUNTER — Ambulatory Visit: Payer: Medicare (Managed Care)

## 2021-10-07 ENCOUNTER — Ambulatory Visit (INDEPENDENT_AMBULATORY_CARE_PROVIDER_SITE_OTHER): Payer: Medicare (Managed Care) | Admitting: Plastic Surgery

## 2021-10-07 ENCOUNTER — Other Ambulatory Visit: Payer: Self-pay

## 2021-10-07 ENCOUNTER — Ambulatory Visit
Admission: RE | Admit: 2021-10-07 | Discharge: 2021-10-07 | Disposition: A | Discharge status: Home / Self Care | Payer: Medicare (Managed Care) | Source: Ambulatory Visit | Attending: Radiation Oncology | Admitting: Radiation Oncology

## 2021-10-07 ENCOUNTER — Ambulatory Visit: Payer: Medicare (Managed Care) | Admitting: Radiation Oncology

## 2021-10-07 DIAGNOSIS — C50411 Malignant neoplasm of upper-outer quadrant of right female breast: Secondary | ICD-10-CM

## 2021-10-07 DIAGNOSIS — Z171 Estrogen receptor negative status [ER-]: Secondary | ICD-10-CM

## 2021-10-07 DIAGNOSIS — Z5112 Encounter for antineoplastic immunotherapy: Secondary | ICD-10-CM | POA: Diagnosis not present

## 2021-10-07 DIAGNOSIS — Z9011 Acquired absence of right breast and nipple: Secondary | ICD-10-CM

## 2021-10-07 LAB — RAD ONC ARIA SESSION SUMMARY
Course Elapsed Days: 42
Plan Fractions Treated to Date: 1
Plan Prescribed Dose Per Fraction: 2 Gy
Plan Total Fractions Prescribed: 5
Plan Total Prescribed Dose: 10 Gy
Reference Point Dosage Given to Date: 52.4 Gy
Reference Point Session Dosage Given: 2 Gy
Session Number: 29

## 2021-10-07 NOTE — Progress Notes (Signed)
Patient presents for follow-up after right breast reconstruction.  She had skin sparing mastectomy with tissue expander placement and was inflated to the size that she wanted.  She has been undergoing radiation and has a few treatments left.  She reports some pain and redness from those treatments and is anxious for them to be finished.  On exam she has some erythema around the right expander consistent with her current treatment regimen.  Port is still in the left chest.  Volume on the left side sits lower than the right side.  We reviewed the plan for tissue expander exchange to gel implant at least 3 months after radiation is finished.  She is thinking about whether or not she wants lift on the left side which I do think would help with symmetry.  I will plan to touch base with her in August to go over these points again and will come up with a more definitive surgical plan.  All of her questions were answered.

## 2021-10-08 ENCOUNTER — Ambulatory Visit
Admission: RE | Admit: 2021-10-08 | Discharge: 2021-10-08 | Disposition: A | Discharge status: Home / Self Care | Payer: Medicare (Managed Care) | Source: Ambulatory Visit | Attending: Radiation Oncology | Admitting: Radiation Oncology

## 2021-10-08 ENCOUNTER — Other Ambulatory Visit: Payer: Self-pay

## 2021-10-08 DIAGNOSIS — Z5112 Encounter for antineoplastic immunotherapy: Secondary | ICD-10-CM | POA: Diagnosis not present

## 2021-10-08 LAB — RAD ONC ARIA SESSION SUMMARY
Course Elapsed Days: 43
Plan Fractions Treated to Date: 2
Plan Prescribed Dose Per Fraction: 2 Gy
Plan Total Fractions Prescribed: 5
Plan Total Prescribed Dose: 10 Gy
Reference Point Dosage Given to Date: 54.4 Gy
Reference Point Session Dosage Given: 2 Gy
Session Number: 30

## 2021-10-11 ENCOUNTER — Encounter: Payer: Self-pay | Admitting: *Deleted

## 2021-10-11 ENCOUNTER — Other Ambulatory Visit: Payer: Self-pay

## 2021-10-11 ENCOUNTER — Ambulatory Visit: Payer: Medicare (Managed Care)

## 2021-10-11 ENCOUNTER — Ambulatory Visit
Admission: RE | Admit: 2021-10-11 | Discharge: 2021-10-11 | Disposition: A | Discharge status: Home / Self Care | Payer: Medicare (Managed Care) | Source: Ambulatory Visit | Attending: Radiation Oncology | Admitting: Radiation Oncology

## 2021-10-11 DIAGNOSIS — Z5112 Encounter for antineoplastic immunotherapy: Secondary | ICD-10-CM | POA: Diagnosis not present

## 2021-10-11 LAB — RAD ONC ARIA SESSION SUMMARY
Course Elapsed Days: 46
Plan Fractions Treated to Date: 3
Plan Prescribed Dose Per Fraction: 2 Gy
Plan Total Fractions Prescribed: 5
Plan Total Prescribed Dose: 10 Gy
Reference Point Dosage Given to Date: 56.4 Gy
Reference Point Session Dosage Given: 2 Gy
Session Number: 31

## 2021-10-12 ENCOUNTER — Ambulatory Visit: Payer: Medicare (Managed Care)

## 2021-10-12 ENCOUNTER — Ambulatory Visit
Admission: RE | Admit: 2021-10-12 | Discharge: 2021-10-12 | Disposition: A | Discharge status: Home / Self Care | Payer: Medicare (Managed Care) | Source: Ambulatory Visit | Attending: Radiation Oncology | Admitting: Radiation Oncology

## 2021-10-12 ENCOUNTER — Other Ambulatory Visit: Payer: Self-pay

## 2021-10-12 DIAGNOSIS — Z5112 Encounter for antineoplastic immunotherapy: Secondary | ICD-10-CM | POA: Diagnosis not present

## 2021-10-12 LAB — RAD ONC ARIA SESSION SUMMARY
Course Elapsed Days: 47
Plan Fractions Treated to Date: 4
Plan Prescribed Dose Per Fraction: 2 Gy
Plan Total Fractions Prescribed: 5
Plan Total Prescribed Dose: 10 Gy
Reference Point Dosage Given to Date: 58.4 Gy
Reference Point Session Dosage Given: 2 Gy
Session Number: 32

## 2021-10-13 ENCOUNTER — Other Ambulatory Visit: Payer: Self-pay

## 2021-10-13 ENCOUNTER — Encounter: Payer: Self-pay | Admitting: Radiation Oncology

## 2021-10-13 ENCOUNTER — Ambulatory Visit
Admission: RE | Admit: 2021-10-13 | Discharge: 2021-10-13 | Disposition: A | Discharge status: Home / Self Care | Payer: Medicare (Managed Care) | Source: Ambulatory Visit | Attending: Radiation Oncology | Admitting: Radiation Oncology

## 2021-10-13 DIAGNOSIS — Z5112 Encounter for antineoplastic immunotherapy: Secondary | ICD-10-CM | POA: Diagnosis not present

## 2021-10-13 LAB — RAD ONC ARIA SESSION SUMMARY
Course Elapsed Days: 48
Plan Fractions Treated to Date: 5
Plan Prescribed Dose Per Fraction: 2 Gy
Plan Total Fractions Prescribed: 5
Plan Total Prescribed Dose: 10 Gy
Reference Point Dosage Given to Date: 60.4 Gy
Reference Point Session Dosage Given: 2 Gy
Session Number: 33

## 2021-10-14 ENCOUNTER — Encounter: Payer: Self-pay | Admitting: Hematology and Oncology

## 2021-10-14 NOTE — Progress Notes (Signed)
                                                                                                                                                             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 She developed fatigue and anticipated skin changes in the treatment field but she did develop dry desquamation along the axilla.   Plan: The patient will receive a call in about one month from the radiation oncology department. She will continue follow up with Dr. Lindi Adie as well.   ________________________________________________    [name], [facility]

## 2021-10-19 ENCOUNTER — Other Ambulatory Visit: Payer: Medicare Other

## 2021-10-19 ENCOUNTER — Inpatient Hospital Stay: Payer: Medicare (Managed Care)

## 2021-10-19 ENCOUNTER — Other Ambulatory Visit: Payer: Self-pay

## 2021-10-19 ENCOUNTER — Ambulatory Visit: Payer: Medicare Other | Admitting: Hematology and Oncology

## 2021-10-19 VITALS — BP 132/68 | HR 82 | Temp 98.4°F | Resp 17 | Wt 143.2 lb

## 2021-10-19 DIAGNOSIS — Z5112 Encounter for antineoplastic immunotherapy: Secondary | ICD-10-CM | POA: Diagnosis not present

## 2021-10-19 DIAGNOSIS — C50411 Malignant neoplasm of upper-outer quadrant of right female breast: Secondary | ICD-10-CM

## 2021-10-19 MED ORDER — SODIUM CHLORIDE 0.9 % IV SOLN: Freq: Once | INTRAVENOUS | Status: AC

## 2021-10-19 MED ORDER — ACETAMINOPHEN 325 MG PO TABS
650.0000 mg | ORAL_TABLET | Freq: Once | ORAL | Status: AC
  Administered 2021-10-19: 650 mg via ORAL
  Filled 2021-10-19: qty 2

## 2021-10-19 MED ORDER — TRASTUZUMAB-DKST CHEMO 150 MG IV SOLR
400.0000 mg | Freq: Once | INTRAVENOUS | Status: AC
  Administered 2021-10-19: 400 mg via INTRAVENOUS
  Filled 2021-10-19: qty 19.05

## 2021-10-19 MED ORDER — SODIUM CHLORIDE 0.9 % IV SOLN
420.0000 mg | Freq: Once | INTRAVENOUS | Status: AC
  Administered 2021-10-19: 420 mg via INTRAVENOUS
  Filled 2021-10-19: qty 14

## 2021-10-19 MED ORDER — SODIUM CHLORIDE 0.9% FLUSH
10.0000 mL | INTRAVENOUS | Status: DC | PRN
  Administered 2021-10-19: 10 mL

## 2021-10-19 MED ORDER — DIPHENHYDRAMINE HCL 25 MG PO CAPS
50.0000 mg | ORAL_CAPSULE | Freq: Once | ORAL | Status: AC
  Administered 2021-10-19: 50 mg via ORAL
  Filled 2021-10-19: qty 2

## 2021-10-19 MED ORDER — HEPARIN SOD (PORK) LOCK FLUSH 100 UNIT/ML IV SOLN
500.0000 [IU] | Freq: Once | INTRAVENOUS | Status: AC | PRN
  Administered 2021-10-19: 500 [IU]

## 2021-10-20 ENCOUNTER — Ambulatory Visit: Payer: PRIVATE HEALTH INSURANCE | Admitting: Nurse Practitioner

## 2021-10-26 NOTE — Progress Notes (Signed)
Patient Care Team: Pcp, No as PCP - General Mauro Kaufmann, RN as Oncology Nurse Navigator Rockwell Germany, RN as Oncology Nurse Navigator  DIAGNOSIS:  Encounter Diagnosis  Name Primary?   Malignant neoplasm of upper-outer quadrant of right breast in female, estrogen receptor negative ([place])     SUMMARY OF ONCOLOGIC HISTORY: Oncology History  Malignant neoplasm of upper-outer quadrant of right breast in female, estrogen receptor negative ([place])  11/17/2020 Initial Diagnosis   Work-up performed at [facility]: Palpable right breast mass.  Mammogram and ultrasound 11/10/2020: Spiculated 4.9 cm mass UOQ right breast with microcalcifications, biopsy revealed IDC ER/PR negative, HER2 positive 3+ by IHC, axillary lymph node positive (2 lymph nodes were detected)   12/03/2020 PET scan   Right breast malignancy SUV 4.6, subcentimeter right level 1 and 2 axillary lymph nodes.  No distant metastatic disease.   12/21/2020 Cancer Staging   Staging form: Breast, AJCC 8th Edition - Clinical stage from 12/21/2020: Stage IIB (cT2, cN1, cM0, G3, ER-, PR-, HER2+) - Signed by Nicholas Lose, MD on 12/21/2020 Stage prefix: Initial diagnosis Histologic grading system: 3 grade system   12/29/2020 -  Chemotherapy   Patient is on Treatment Plan : BREAST  Docetaxel + Carboplatin + Trastuzumab + Pertuzumab  (TCHP) q21d      06/29/2021 Surgery   Right mastectomy: No residual cancer identified, 0/4 lymph nodes negative, complete pathologic response   08/27/2021 - 10/12/2021 Radiation Therapy   Adjuvant radiation     CHIEF COMPLIANT: Herceptin and Perjeta  INTERVAL HISTORY: Marie Day is a 76 y.o. with above-mentioned history of invasive ductal carcinoma of the right breast, completed chemotherapy with [place]. She presents to the clinic today for Herceptin and Perjeta maintenance. States that she has some mild diarrhea a couple times a week. States that she no longer is using the imodium. Overall its  manageable. Denies nausea.   ALLERGIES:  is allergic to sulfa antibiotics.  MEDICATIONS:  Current Outpatient Medications  Medication Sig Dispense Refill   Acetaminophen (TYLENOL 8 HOUR ARTHRITIS PAIN PO) Take by mouth.     lidocaine-prilocaine (EMLA) cream Apply to affected area once 30 g 3   naproxen (NAPROSYN) 250 MG tablet Take 250 mg by mouth at bedtime.     ondansetron (ZOFRAN) 8 MG tablet Take 1 tablet (8 mg total) by mouth 2 (two) times daily as needed (Nausea or vomiting). Start on the third day after chemotherapy. 30 tablet 1   prochlorperazine (COMPAZINE) 10 MG tablet TAKE 1 TABLET (10 MG TOTAL) BY MOUTH EVERY 6 (SIX) HOURS AS NEEDED (NAUSEA OR VOMITING). 30 tablet 1   No current facility-administered medications for this visit.    PHYSICAL EXAMINATION: ECOG PERFORMANCE STATUS: 1 - Symptomatic but completely ambulatory  Vitals:   11/09/21 1104  BP: (!) 129/51  Pulse: 82  Resp: 17  Temp: (!) 97.3 F (36.3 C)  SpO2: 99%   Filed Weights   11/09/21 1104  Weight: 143 lb (64.9 kg)      LABORATORY DATA:  I have reviewed the data as listed    Latest Ref Rng & Units 11/09/2021   10:11 AM 09/28/2021   11:26 AM 08/17/2021   11:21 AM  CMP  Glucose 70 - 99 mg/dL 121  117  99   BUN 8 - 23 mg/dL $Remove'11  10  10   'DdrHqKO$ Creatinine 0.44 - 1.00 mg/dL 0.70  0.54  0.78   Sodium 135 - 145 mmol/L 141  141  140  Potassium 3.5 - 5.1 mmol/L 3.8  3.8  4.3   Chloride 98 - 111 mmol/L 106  105  106   CO2 22 - 32 mmol/L $RemoveB'29  26  28   'KRLExofS$ Calcium 8.9 - 10.3 mg/dL 9.7  9.4  9.4   Total Protein 6.5 - 8.1 g/dL 6.8  6.9  6.6   Total Bilirubin 0.3 - 1.2 mg/dL 0.4  0.5  0.4   Alkaline Phos 38 - 126 U/L 102  93  96   AST 15 - 41 U/L $Remo'16  15  14   'BABPB$ ALT 0 - 44 U/L $Remo'13  13  11     'ADElp$ Lab Results  Component Value Date   WBC 5.3 11/09/2021   HGB 10.5 (L) 11/09/2021   HCT 31.9 (L) 11/09/2021   MCV 88.1 11/09/2021   PLT 257 11/09/2021   NEUTROABS 3.2 11/09/2021    ASSESSMENT & PLAN:  Malignant neoplasm of  upper-outer quadrant of right breast in female, estrogen receptor negative ([place]) 11/17/2020:Work-up performed at [name]: Palpable right breast mass.  Mammogram and ultrasound 11/10/2020: Spiculated 4.9 cm mass UOQ right breast with microcalcifications, biopsy revealed IDC ER/PR negative, HER2 positive 3+ by IHC, axillary lymph node positive (2 lymph nodes were detected)   Genetics were negative   Treatment Plan based on multidisciplinary tumor board: 1. Neoadjuvant chemotherapy with TCH Perjeta 6 cycles (started 12/29/2020-04/13/2021) followed by Herceptin Perjeta maintenance  for 1 year 2. 06/29/2021:Right mastectomy: No residual cancer identified, 0/4 lymph nodes negative, complete pathologic response 3. Followed by adjuvant radiation therapy 08/27/2021-10/12/2021 ------------------------------------------------------------------------------------------------------------------------- Current treatment: Herceptin Perjeta maintenance (until 12/21/2021) Toxicities: None  Ongoing radiation: Until 10/12/2021   Diffuse arthralgias and myalgias: The symptoms have come back since chemotherapy has been discontinued Return to clinic every 3 weeks for Herceptin Perjeta and every 6 weeks to follow-up with me. We will request port removal after 12/21/2021.    No orders of the defined types were placed in this encounter.  The patient has a good understanding of the overall plan. she agrees with it. she will call with any problems that may develop before the next visit here. Total time spent: 30 mins including face to face time and time spent for planning, charting and co-ordination of care   Harriette Ohara, MD 11/09/21  I Gardiner Coins am scribing for Dr. Lindi Adie  I have reviewed the above documentation for accuracy and completeness, and I agree with the above.

## 2021-11-09 ENCOUNTER — Inpatient Hospital Stay (HOSPITAL_BASED_OUTPATIENT_CLINIC_OR_DEPARTMENT_OTHER): Payer: Medicare (Managed Care) | Admitting: Hematology and Oncology

## 2021-11-09 ENCOUNTER — Inpatient Hospital Stay: Payer: Medicare (Managed Care)

## 2021-11-09 ENCOUNTER — Other Ambulatory Visit: Payer: Self-pay

## 2021-11-09 ENCOUNTER — Inpatient Hospital Stay: Payer: Medicare (Managed Care) | Attending: Hematology and Oncology

## 2021-11-09 VITALS — BP 118/59 | HR 74 | Temp 98.1°F | Resp 18

## 2021-11-09 DIAGNOSIS — Z5111 Encounter for antineoplastic chemotherapy: Secondary | ICD-10-CM | POA: Insufficient documentation

## 2021-11-09 DIAGNOSIS — C50411 Malignant neoplasm of upper-outer quadrant of right female breast: Secondary | ICD-10-CM

## 2021-11-09 DIAGNOSIS — Z171 Estrogen receptor negative status [ER-]: Secondary | ICD-10-CM

## 2021-11-09 DIAGNOSIS — Z5112 Encounter for antineoplastic immunotherapy: Secondary | ICD-10-CM | POA: Insufficient documentation

## 2021-11-09 DIAGNOSIS — Z95828 Presence of other vascular implants and grafts: Secondary | ICD-10-CM

## 2021-11-09 LAB — CMP (CANCER CENTER ONLY)
ALT: 13 U/L (ref 0–44)
AST: 16 U/L (ref 15–41)
Albumin: 4.1 g/dL (ref 3.5–5.0)
Alkaline Phosphatase: 102 U/L (ref 38–126)
Anion gap: 6 (ref 5–15)
BUN: 11 mg/dL (ref 8–23)
CO2: 29 mmol/L (ref 22–32)
Calcium: 9.7 mg/dL (ref 8.9–10.3)
Chloride: 106 mmol/L (ref 98–111)
Creatinine: 0.7 mg/dL (ref 0.44–1.00)
GFR, Estimated: >60 mL/min (ref >60)
Glucose, Bld: 121 mg/dL — ABNORMAL HIGH (ref 70–99)
Potassium: 3.8 mmol/L (ref 3.5–5.1)
Sodium: 141 mmol/L (ref 135–145)
Total Bilirubin: 0.4 mg/dL (ref 0.3–1.2)
Total Protein: 6.8 g/dL (ref 6.5–8.1)

## 2021-11-09 LAB — CBC WITH DIFFERENTIAL (CANCER CENTER ONLY)
Abs Immature Granulocytes: 0.02 10*3/uL (ref 0.00–0.07)
Basophils Absolute: 0.1 10*3/uL (ref 0.0–0.1)
Basophils Relative: 1 %
Eosinophils Absolute: 0.2 10*3/uL (ref 0.0–0.5)
Eosinophils Relative: 4 %
HCT: 31.9 % — ABNORMAL LOW (ref 36.0–46.0)
Hemoglobin: 10.5 g/dL — ABNORMAL LOW (ref 12.0–15.0)
Immature Granulocytes: 0 %
Lymphocytes Relative: 25 %
Lymphs Abs: 1.3 10*3/uL (ref 0.7–4.0)
MCH: 29 pg (ref 26.0–34.0)
MCHC: 32.9 g/dL (ref 30.0–36.0)
MCV: 88.1 fL (ref 80.0–100.0)
Monocytes Absolute: 0.5 10*3/uL (ref 0.1–1.0)
Monocytes Relative: 9 %
Neutro Abs: 3.2 10*3/uL (ref 1.7–7.7)
Neutrophils Relative %: 61 %
Platelet Count: 257 10*3/uL (ref 150–400)
RBC: 3.62 MIL/uL — ABNORMAL LOW (ref 3.87–5.11)
RDW: 13.5 % (ref 11.5–15.5)
WBC Count: 5.3 10*3/uL (ref 4.0–10.5)
nRBC: 0 % (ref 0.0–0.2)

## 2021-11-09 MED ORDER — DIPHENHYDRAMINE HCL 25 MG PO CAPS
50.0000 mg | ORAL_CAPSULE | Freq: Once | ORAL | Status: AC
  Administered 2021-11-09: 50 mg via ORAL
  Filled 2021-11-09: qty 2

## 2021-11-09 MED ORDER — SODIUM CHLORIDE 0.9% FLUSH
10.0000 mL | Freq: Once | INTRAVENOUS | Status: AC
  Administered 2021-11-09: 10 mL

## 2021-11-09 MED ORDER — HEPARIN SOD (PORK) LOCK FLUSH 100 UNIT/ML IV SOLN
500.0000 [IU] | Freq: Once | INTRAVENOUS | Status: AC | PRN
  Administered 2021-11-09: 500 [IU]

## 2021-11-09 MED ORDER — SODIUM CHLORIDE 0.9 % IV SOLN
420.0000 mg | Freq: Once | INTRAVENOUS | Status: AC
  Administered 2021-11-09: 420 mg via INTRAVENOUS
  Filled 2021-11-09: qty 14

## 2021-11-09 MED ORDER — TRASTUZUMAB-DKST CHEMO 150 MG IV SOLR
400.0000 mg | Freq: Once | INTRAVENOUS | Status: AC
  Administered 2021-11-09: 400 mg via INTRAVENOUS
  Filled 2021-11-09: qty 19.05

## 2021-11-09 MED ORDER — ACETAMINOPHEN 325 MG PO TABS
650.0000 mg | ORAL_TABLET | Freq: Once | ORAL | Status: AC
  Administered 2021-11-09: 650 mg via ORAL
  Filled 2021-11-09: qty 2

## 2021-11-09 MED ORDER — SODIUM CHLORIDE 0.9% FLUSH
10.0000 mL | INTRAVENOUS | Status: DC | PRN
  Administered 2021-11-09: 10 mL

## 2021-11-09 MED ORDER — SODIUM CHLORIDE 0.9 % IV SOLN: Freq: Once | INTRAVENOUS | Status: AC

## 2021-11-09 NOTE — Assessment & Plan Note (Signed)
11/17/2020:Work-up performed at [facility]: Palpable right breast mass. Mammogram and ultrasound 11/10/2020: Spiculated 4.9 cm mass UOQ right breast with microcalcifications, biopsy revealed IDC ER/PR negative, HER2 positive 3+ by IHC, axillary lymph node positive (2 lymph nodes were detected)  Genetics were negative  Treatment Planbased on multidisciplinary tumor board: 1. Neoadjuvant chemotherapy with [place] Perjeta 6 cycles(started 12/29/2020-04/13/2021)followed by Herceptin Perjeta maintenance for 1 year 2.06/29/2021:Right mastectomy: No residual cancer identified, 0/4 lymph nodes negative, complete pathologic response 3. Followed by adjuvant radiation therapy 08/27/2021-10/12/2021 ------------------------------------------------------------------------------------------------------------------------- Current treatment: Herceptin Perjeta maintenance (until 12/21/2021) Toxicities: None  Ongoing radiation: Until 10/12/2021  Diffuse arthralgias and myalgias: The symptoms have come back since chemotherapy has been discontinued Return to clinic every 3 weeks for Herceptin Perjeta and every 6 weeks to follow-up with me.

## 2021-11-09 NOTE — Patient Instructions (Signed)
[place] [facility] ONCOLOGY  Discharge Instructions: Thank you for choosing [facility] to provide your oncology and hematology care.   If you have a lab appointment with the [facility], please go directly to the [facility] and check in at the registration area.   Wear comfortable clothing and clothing appropriate for easy access to any Portacath or PICC line.   We strive to give you quality time with your provider. You may need to reschedule your appointment if you arrive late (15 or more minutes).  Arriving late affects you and other patients whose appointments are after yours.  Also, if you miss three or more appointments without notifying the office, you may be dismissed from the clinic at the provider's discretion.      For prescription refill requests, have your pharmacy contact our office and allow 72 hours for refills to be completed.    Today you received the following chemotherapy and/or immunotherapy agents: Ogivri, Pertuzumab.       To help prevent nausea and vomiting after your treatment, we encourage you to take your nausea medication as directed.  BELOW ARE SYMPTOMS THAT SHOULD BE REPORTED IMMEDIATELY: *FEVER GREATER THAN 100.4 F (38 C) OR HIGHER *CHILLS OR SWEATING *NAUSEA AND VOMITING THAT IS NOT CONTROLLED WITH YOUR NAUSEA MEDICATION *UNUSUAL SHORTNESS OF BREATH *UNUSUAL BRUISING OR BLEEDING *URINARY PROBLEMS (pain or burning when urinating, or frequent urination) *BOWEL PROBLEMS (unusual diarrhea, constipation, pain near the anus) TENDERNESS IN MOUTH AND THROAT WITH OR WITHOUT PRESENCE OF ULCERS (sore throat, sores in mouth, or a toothache) UNUSUAL RASH, SWELLING OR PAIN  UNUSUAL VAGINAL DISCHARGE OR ITCHING   Items with * indicate a potential emergency and should be followed up as soon as possible or go to the Emergency Department if any problems should occur.  Please show the CHEMOTHERAPY ALERT CARD or IMMUNOTHERAPY ALERT CARD at  check-in to the Emergency Department and triage nurse.  Should you have questions after your visit or need to cancel or reschedule your appointment, please contact [facility]  Dept: [phone]  and follow the prompts.  Office hours are 8:00 a.m. to 4:30 p.m. Monday - Friday. Please note that voicemails left after 4:00 p.m. may not be returned until the following business day.  We are closed weekends and major holidays. You have access to a nurse at all times for urgent questions. Please call the main number to the clinic Dept: [phone] and follow the prompts.   For any non-urgent questions, you may also contact your provider using MyChart. We now offer e-Visits for anyone 40 and older to request care online for non-urgent symptoms. For details visit [email or web address].   Also download the MyChart app! Go to the app store, search "MyChart", open the app, select [facility], and log in with your MyChart username and password.  Due to Covid, a mask is required upon entering the hospital/clinic. If you do not have a mask, one will be given to you upon arrival. For doctor visits, patients may have 1 support person aged 63 or older with them. For treatment visits, patients cannot have anyone with them due to current Covid guidelines and our immunocompromised population.

## 2021-11-09 NOTE — Progress Notes (Signed)
Patient stayed for her 30 minute observation period. No issues today. VSS- BP (!) 118/59 (BP Location: Left Arm, Patient Position: Sitting)   Pulse 74   Temp 98.1 F (36.7 C) (Oral)   Resp 18   SpO2 99%

## 2021-11-10 ENCOUNTER — Other Ambulatory Visit: Payer: Self-pay | Admitting: *Deleted

## 2021-11-10 DIAGNOSIS — C50411 Malignant neoplasm of upper-outer quadrant of right female breast: Secondary | ICD-10-CM

## 2021-11-17 ENCOUNTER — Other Ambulatory Visit: Payer: Self-pay | Admitting: Physician Assistant

## 2021-11-17 DIAGNOSIS — Z13820 Encounter for screening for osteoporosis: Secondary | ICD-10-CM

## 2021-11-29 ENCOUNTER — Ambulatory Visit
Admission: RE | Admit: 2021-11-29 | Discharge: 2021-11-29 | Disposition: A | Discharge status: Home / Self Care | Payer: PRIVATE HEALTH INSURANCE | Source: Ambulatory Visit | Attending: Radiation Oncology | Admitting: Radiation Oncology

## 2021-11-29 ENCOUNTER — Ambulatory Visit (INDEPENDENT_AMBULATORY_CARE_PROVIDER_SITE_OTHER): Payer: Medicare (Managed Care) | Admitting: Physician Assistant

## 2021-11-29 DIAGNOSIS — C50411 Malignant neoplasm of upper-outer quadrant of right female breast: Secondary | ICD-10-CM

## 2021-11-29 DIAGNOSIS — Z171 Estrogen receptor negative status [ER-]: Secondary | ICD-10-CM

## 2021-11-29 NOTE — Progress Notes (Signed)
  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 She developed fatigue and anticipated skin changes in the treatment field but she did develop dry desquamation along the axilla. She is having persistent discomfort deep in her right axilla but the skin is healed at this time.   Impression/Plan: 1. 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 The patient has been doing well since completion of radiotherapy. We discussed that we would be happy to continue to follow her as needed, but she will also continue to follow up with Dr. Lindi Adie in medical oncology. She was counseled on skin care as well as measures to avoid sun exposure to this area.  2. Survivorship. We discussed the importance of survivorship evaluation and encouraged her to attend her upcoming visit with that clinic. 3. Right axillary discomfort. I encouraged her to discuss her concerns with Dr. Claudia Desanctis and possibly see physical therapy.      [name], PAC

## 2021-11-29 NOTE — Progress Notes (Signed)
Patient is a 76 year old female with PMH of right-sided breast cancer s/p right-sided mastectomy with immediate reconstruction performed 06/29/2021 by Dr. Claudia Desanctis who presents to clinic for postoperative follow-up.  She was last seen here in clinic on 10/07/2021 by Dr. Claudia Desanctis.  At that time, she was reporting some pain and redness from radiation treatments.  Port remained on left-sided chest.  Reviewed plan for implant exchange at least 3 months after radiation has concluded.  She was considering whether or not she wanted a lift on the left side to help with the symmetry as it was slightly lower than the right side.  Plan was to go over surgical plan in more detail in August at a follow-up appointment.  Today, she presents because she would like to proceed with implant exchange sooner.  She also is inquiring about whether or not her port can be removed at time of surgery.

## 2021-11-29 NOTE — Progress Notes (Signed)
  Patient is a 76 year old female with PMH of right-sided breast cancer s/p right-sided mastectomy with immediate reconstruction performed 06/29/2021 by Dr. Claudia Desanctis who presents to clinic for postoperative follow-up.  She was last seen here in clinic on 10/07/2021 by Dr. Claudia Desanctis.  At that time, she was reporting some pain and redness from radiation treatments.  Port remained on left-sided chest.  Since that time she has been doing very well, she completed her radiation therapy on 10/13/2021 with no plan for any future radiation.  She remains on Herceptin and Perjeta maintenance therapy.   Last visit the reviewed plan for implant exchange at least 3 months after radiation has concluded.  She was considering whether or not she wanted a lift on the left side to help with the symmetry as it was slightly lower than the right side.  Plan was to go over surgical plan in more detail in August at a follow-up appointment.  Today, she presents to discuss implant exchange and care moving forward.  She would like to have this surgery completed as soon as safely possible.  She notes that her incision has healed without complications, she notes some minor discomfort in her right axilla, she denies any redness swelling warmth or any issues.  She notes that she has spoken with her oncologist who has given clearance for removal of port at the time of surgical management.  Since her last office visit Dr. Claudia Desanctis is no longer part of her practice, the patient is okay with proceeding with either of our other surgeons.  Given her last radiation therapy on Oct 13, 2021 we will need to wait for implant exchange.  I will reach out to Dr. Marla Roe to discuss her plan moving forward as far as surgical management.  The patient would like to schedule a follow-up visit with the surgeon to discuss her care.  The patient had no further questions or concerns at today's visit.

## 2021-11-30 ENCOUNTER — Other Ambulatory Visit: Payer: Self-pay

## 2021-11-30 ENCOUNTER — Inpatient Hospital Stay: Payer: Medicare (Managed Care) | Attending: Hematology and Oncology

## 2021-11-30 VITALS — BP 140/66 | HR 70 | Temp 98.1°F | Resp 17 | Wt 146.2 lb

## 2021-11-30 DIAGNOSIS — C50411 Malignant neoplasm of upper-outer quadrant of right female breast: Secondary | ICD-10-CM | POA: Diagnosis not present

## 2021-11-30 DIAGNOSIS — Z5111 Encounter for antineoplastic chemotherapy: Secondary | ICD-10-CM | POA: Insufficient documentation

## 2021-11-30 DIAGNOSIS — Z171 Estrogen receptor negative status [ER-]: Secondary | ICD-10-CM | POA: Insufficient documentation

## 2021-11-30 MED ORDER — SODIUM CHLORIDE 0.9 % IV SOLN: Freq: Once | INTRAVENOUS | Status: AC

## 2021-11-30 MED ORDER — SODIUM CHLORIDE 0.9% FLUSH
10.0000 mL | INTRAVENOUS | Status: DC | PRN
  Administered 2021-11-30: 10 mL

## 2021-11-30 MED ORDER — TRASTUZUMAB-DKST CHEMO 150 MG IV SOLR
400.0000 mg | Freq: Once | INTRAVENOUS | Status: AC
  Administered 2021-11-30: 400 mg via INTRAVENOUS
  Filled 2021-11-30: qty 19.05

## 2021-11-30 MED ORDER — SODIUM CHLORIDE 0.9 % IV SOLN
420.0000 mg | Freq: Once | INTRAVENOUS | Status: AC
  Administered 2021-11-30: 420 mg via INTRAVENOUS
  Filled 2021-11-30: qty 14

## 2021-11-30 MED ORDER — DIPHENHYDRAMINE HCL 25 MG PO CAPS
50.0000 mg | ORAL_CAPSULE | Freq: Once | ORAL | Status: AC
  Administered 2021-11-30: 50 mg via ORAL
  Filled 2021-11-30: qty 2

## 2021-11-30 MED ORDER — HEPARIN SOD (PORK) LOCK FLUSH 100 UNIT/ML IV SOLN
500.0000 [IU] | Freq: Once | INTRAVENOUS | Status: AC | PRN
  Administered 2021-11-30: 500 [IU]

## 2021-11-30 NOTE — Patient Instructions (Signed)
[facility]COL[facility]  Discharge Instructions: Thank you for choosing [facility] to provide your oncology and hematology care.   If you have a lab appointment with the [facility], please go directly to the [facility] and check in at the registration area.   Wear comfortable clothing and clothing appropriate for easy access to any Portacath or PICC line.   We strive to give you quality time with your provider. You may need to reschedule your appointment if you arrive late (15 or more minutes).  Arriving late affects you and other patients whose appointments are after yours.  Also, if you miss three or more appointments without notifying the office, you may be dismissed from the clinic at the provider's discretion.      For prescription refill requests, have your pharmacy contact our office and allow 72 hours for refills to be completed.    Today you received the following chemotherapy and/or immunotherapy agents: trastuzumab and pertuzumab      To help prevent nausea and vomiting after your treatment, we encourage you to take your nausea medication as directed.  BELOW ARE SYMPTOMS THAT SHOULD BE REPORTED IMMEDIATELY: *FEVER GREATER THAN 100.4 F (38 C) OR HIGHER *CHILLS OR SWEATING *NAUSEA AND VOMITING THAT IS NOT CONTROLLED WITH YOUR NAUSEA MEDICATION *UNUSUAL SHORTNESS OF BREATH *UNUSUAL BRUISING OR BLEEDING *URINARY PROBLEMS (pain or burning when urinating, or frequent urination) *BOWEL PROBLEMS (unusual diarrhea, constipation, pain near the anus) TENDERNESS IN MOUTH AND THROAT WITH OR WITHOUT PRESENCE OF ULCERS (sore throat, sores in mouth, or a toothache) UNUSUAL RASH, SWELLING OR PAIN  UNUSUAL VAGINAL DISCHARGE OR ITCHING   Items with * indicate a potential emergency and should be followed up as soon as possible or go to the Emergency Department if any problems should occur.  Please show the CHEMOTHERAPY ALERT CARD or IMMUNOTHERAPY ALERT  CARD at check-in to the Emergency Department and triage nurse.  Should you have questions after your visit or need to cancel or reschedule your appointment, please contact [facility]  Dept: [phone]  and follow the prompts.  Office hours are 8:00 a.m. to 4:30 p.m. Monday - Friday. Please note that voicemails left after 4:00 p.m. may not be returned until the following business day.  We are closed weekends and major holidays. You have access to a nurse at all times for urgent questions. Please call the main number to the clinic Dept: [phone] and follow the prompts.   For any non-urgent questions, you may also contact your provider using MyChart. We now offer e-Visits for anyone 58 and older to request care online for non-urgent symptoms. For details visit [email or web address].   Also download the MyChart app! Go to the app store, search "MyChart", open the app, select [facility], and log in with your MyChart username and password.  Masks are optional in the cancer centers. If you would like for your care team to wear a mask while they are taking care of you, please let them know. For doctor visits, patients may have with them one support person who is at least 75 years old. At this time, visitors are not allowed in the infusion area.

## 2021-11-30 NOTE — Progress Notes (Signed)
Pt states she took 650 mg of tylenol at home before appt

## 2021-12-13 ENCOUNTER — Other Ambulatory Visit: Payer: Self-pay

## 2021-12-17 NOTE — Progress Notes (Signed)
Patient Care Team: Pcp, No as PCP - General Mauro Kaufmann, RN as Oncology Nurse Navigator Rockwell Germany, RN as Oncology Nurse Navigator  DIAGNOSIS: No diagnosis found.  SUMMARY OF ONCOLOGIC HISTORY: Oncology History  Malignant neoplasm of upper-outer quadrant of right breast in female, estrogen receptor negative ([place])  11/17/2020 Initial Diagnosis   Work-up performed at [facility]: Palpable right breast mass.  Mammogram and ultrasound 11/10/2020: Spiculated 4.9 cm mass UOQ right breast with microcalcifications, biopsy revealed IDC ER/PR negative, HER2 positive 3+ by IHC, axillary lymph node positive (2 lymph nodes were detected)   12/03/2020 PET scan   Right breast malignancy SUV 4.6, subcentimeter right level 1 and 2 axillary lymph nodes.  No distant metastatic disease.   12/21/2020 Cancer Staging   Staging form: Breast, AJCC 8th Edition - Clinical stage from 12/21/2020: Stage IIB (cT2, cN1, cM0, G3, ER-, PR-, HER2+) - Signed by Nicholas Lose, MD on 12/21/2020 Stage prefix: Initial diagnosis Histologic grading system: 3 grade system   12/29/2020 -  Chemotherapy   Patient is on Treatment Plan : BREAST  Docetaxel + Carboplatin + Trastuzumab + Pertuzumab  (TCHP) q21d      06/29/2021 Surgery   Right mastectomy: No residual cancer identified, 0/4 lymph nodes negative, complete pathologic response   08/27/2021 - 10/12/2021 Radiation Therapy   Adjuvant radiation     CHIEF COMPLIANT: Herceptin and Perjeta    INTERVAL HISTORY: Marie Day is a  76 y.o. with above-mentioned history of invasive ductal carcinoma of the right breast. She presents to the clinic today for a follow-up    ALLERGIES:  is allergic to sulfa antibiotics.  MEDICATIONS:  Current Outpatient Medications  Medication Sig Dispense Refill   Acetaminophen (TYLENOL 8 HOUR ARTHRITIS PAIN PO) Take by mouth.     lidocaine-prilocaine (EMLA) cream Apply to affected area once 30 g 3   No current facility-administered  medications for this visit.    PHYSICAL EXAMINATION: ECOG PERFORMANCE STATUS: {CHL ONC ECOG PS:[phone]}  There were no vitals filed for this visit. There were no vitals filed for this visit.  BREAST:*** No palpable masses or nodules in either right or left breasts. No palpable axillary supraclavicular or infraclavicular adenopathy no breast tenderness or nipple discharge. (exam performed in the presence of a chaperone)  LABORATORY DATA:  I have reviewed the data as listed    Latest Ref Rng & Units 11/09/2021   10:11 AM 09/28/2021   11:26 AM 08/17/2021   11:21 AM  CMP  Glucose 70 - 99 mg/dL 121  117  99   BUN 8 - 23 mg/dL $Remove'11  10  10   'ADdXkWl$ Creatinine 0.44 - 1.00 mg/dL 0.70  0.54  0.78   Sodium 135 - 145 mmol/L 141  141  140   Potassium 3.5 - 5.1 mmol/L 3.8  3.8  4.3   Chloride 98 - 111 mmol/L 106  105  106   CO2 22 - 32 mmol/L $RemoveB'29  26  28   'PyNyLxkB$ Calcium 8.9 - 10.3 mg/dL 9.7  9.4  9.4   Total Protein 6.5 - 8.1 g/dL 6.8  6.9  6.6   Total Bilirubin 0.3 - 1.2 mg/dL 0.4  0.5  0.4   Alkaline Phos 38 - 126 U/L 102  93  96   AST 15 - 41 U/L $Remo'16  15  14   'lEHKY$ ALT 0 - 44 U/L $Remo'13  13  11     'RzAyU$ Lab Results  Component Value Date  WBC 5.3 11/09/2021   HGB 10.5 (L) 11/09/2021   HCT 31.9 (L) 11/09/2021   MCV 88.1 11/09/2021   PLT 257 11/09/2021   NEUTROABS 3.2 11/09/2021    ASSESSMENT & PLAN:  No problem-specific Assessment & Plan notes found for this encounter.    No orders of the defined types were placed in this encounter.  The patient has a good understanding of the overall plan. she agrees with it. she will call with any problems that may develop before the next visit here. Total time spent: 30 mins including face to face time and time spent for planning, charting and co-ordination of care   Suzzette Righter, [place] 12/17/21    I Gardiner Coins am scribing for Dr. Lindi Adie  ***

## 2021-12-18 IMAGING — MR MR BREAST BILAT WO/W CM
8 of 13 series · 31 of 48 positions shown · IV contrast (8 ml gadavist)
Comparison: None.

CLINICAL DATA: 74-year-old female with newly diagnosed invasive
right breast cancer. Pretreatment staging exam.

LABS:  None.
EXAM:
BILATERAL BREAST MRI WITH AND WITHOUT CONTRAST
TECHNIQUE: Multiplanar, multisequence MR images of both breasts were obtained
prior to and following the intravenous administration of 8 ml of
Gadavist

[Series 2: t2_tirm_tra ipat (a-p) · axial · 3.0mm · 0.68mm/px · 1 of 55 slices shown]
[im 1/55]
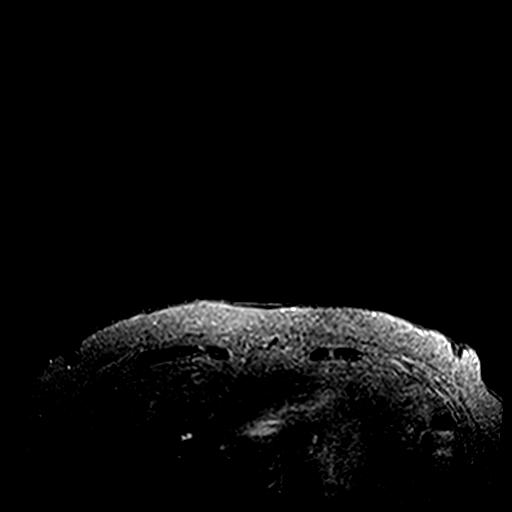

[Series 3: fl3d pre-cm no · axial · non-contrast · 1.2mm · 0.91mm/px · z∈[-86,+85]mm · 4 of 144 slices shown]
[im 1/144]
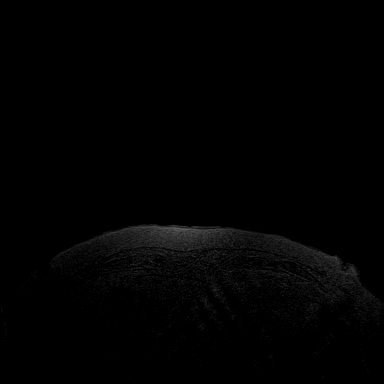
[im 48/144]
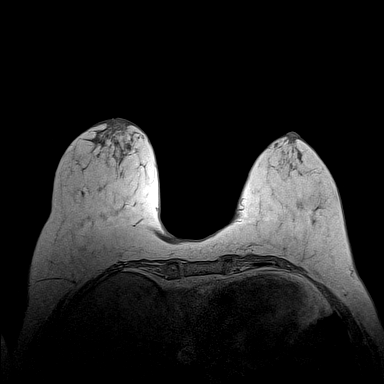
[im 96/144]
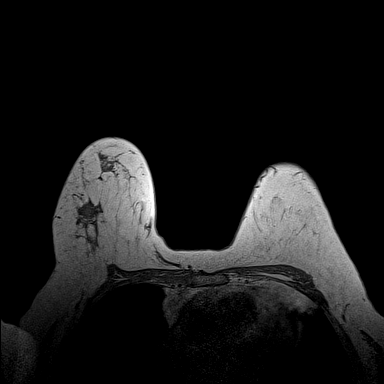
[im 144/144]
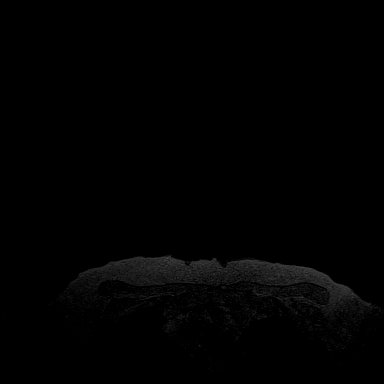

[Series 4: fl3d pre-cm · axial · non-contrast · 1.2mm · 0.91mm/px · z∈[-86,+85]mm · 5 of 144 slices shown (1 of 2)]
[im 1/144]
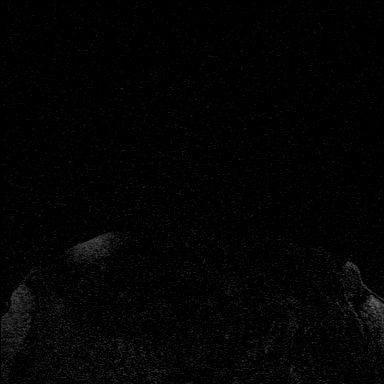
[im 36/144]
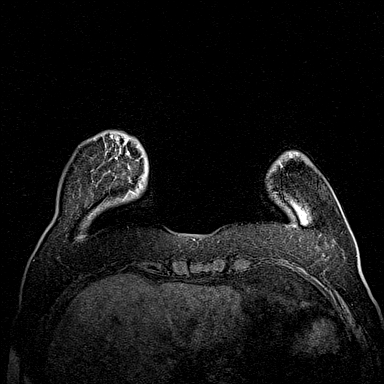
[im 72/144]
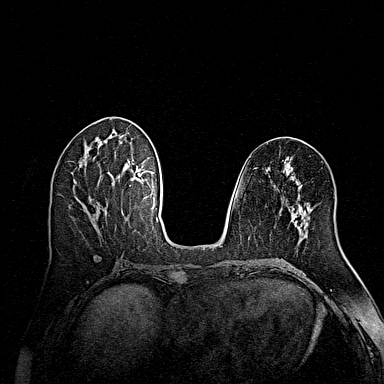
[im 108/144]
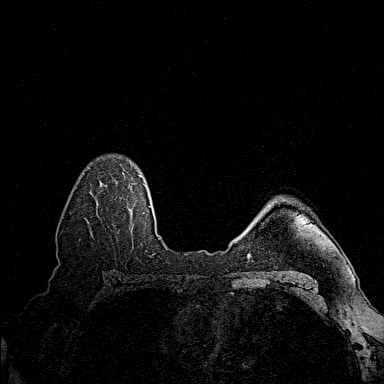
[im 144/144]
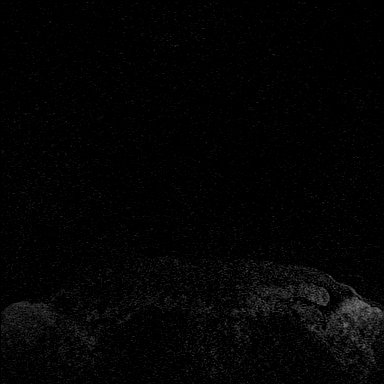

[Series 5: fl3d pre-cm · axial · non-contrast · 1.2mm · 0.94mm/px · z∈[-86,+85]mm · 5 of 144 slices shown (2 of 2)]
[im 1/144]
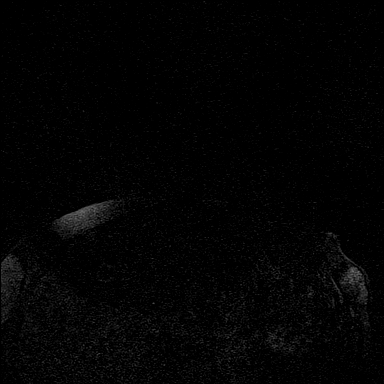
[im 36/144]
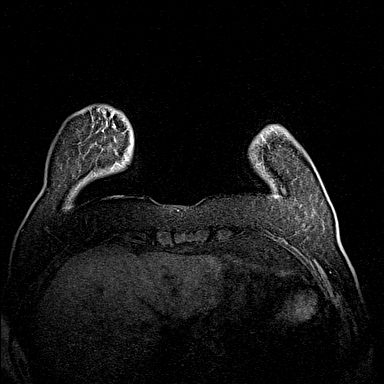
[im 72/144]
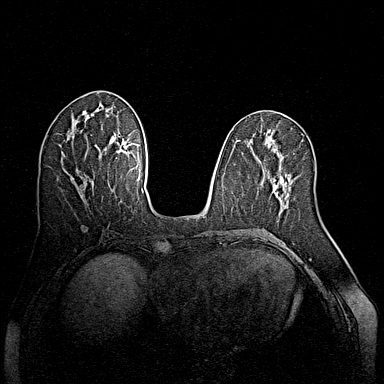
[im 108/144]
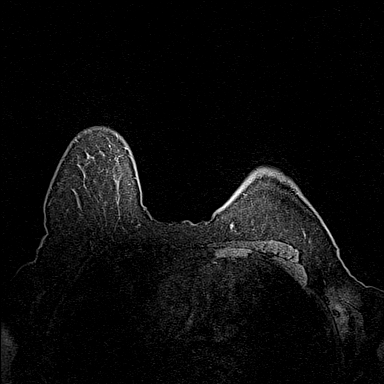
[im 144/144]
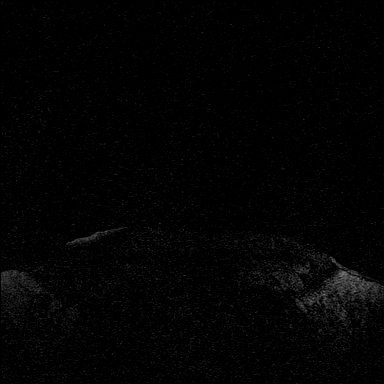

[Series 6: fl3d post-cm 20 · axial · 1.2mm · 0.94mm/px · z∈[-86,+85]mm · 5 of 144 slices shown (1 of 3)]
[im 1/144]
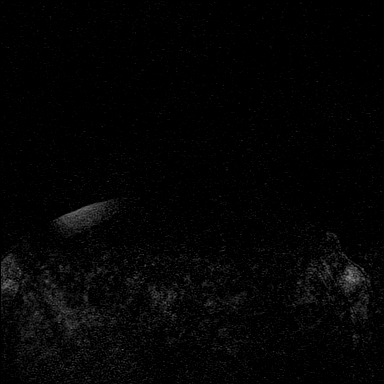
[im 36/144]
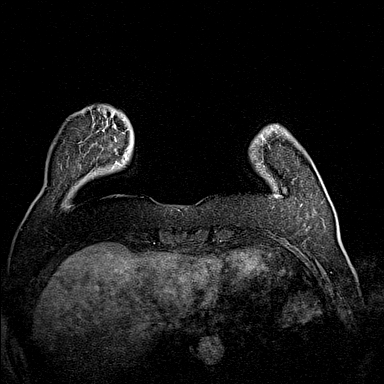
[im 72/144]
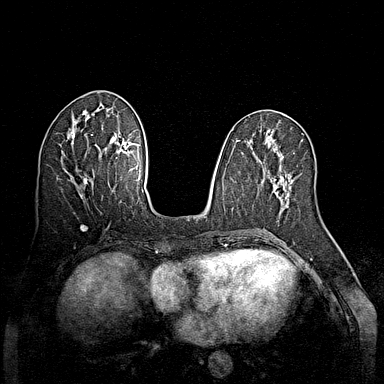
[im 108/144]
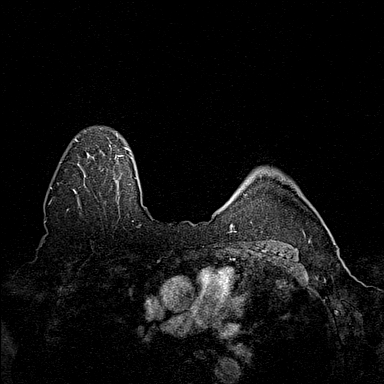
[im 144/144]
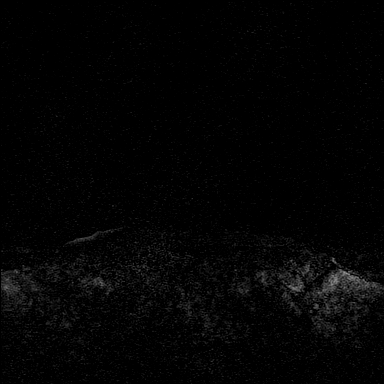

[Series 7: fl3d post-cm 20 · axial · 1.2mm · 0.94mm/px · z∈[-86,+85]mm · 5 of 144 slices shown (2 of 3)]
[im 1/144]
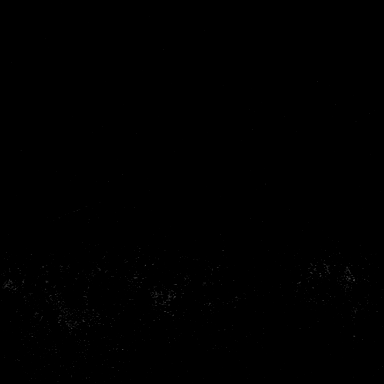
[im 36/144]
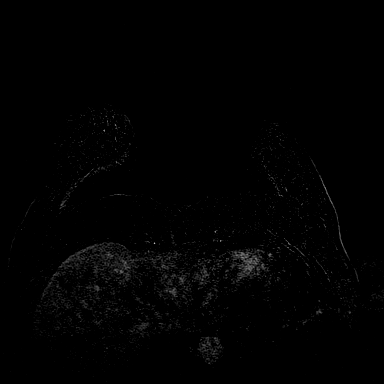
[im 72/144]
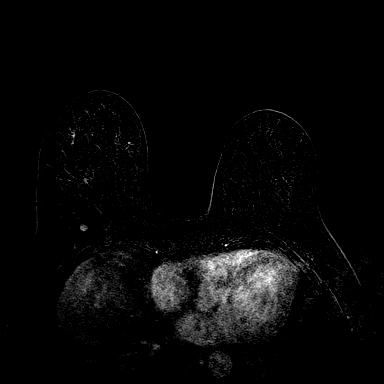
[im 108/144]
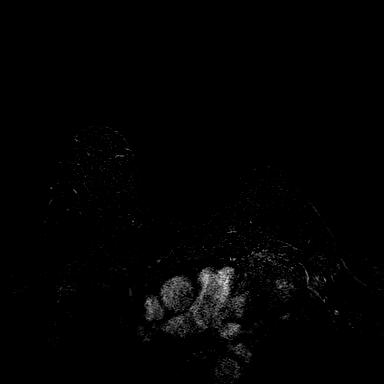
[im 144/144]
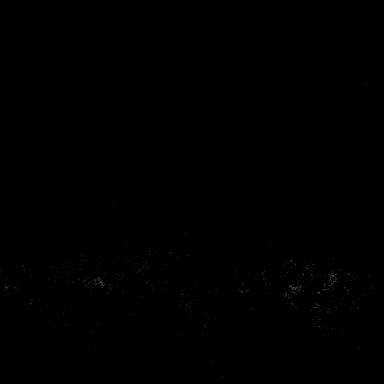

[Series 8: fl3d post-cm 20 · axial · 172.8mm · 0.94mm/px · 1 of 1 slices shown (3 of 3)]
[im 1/1]
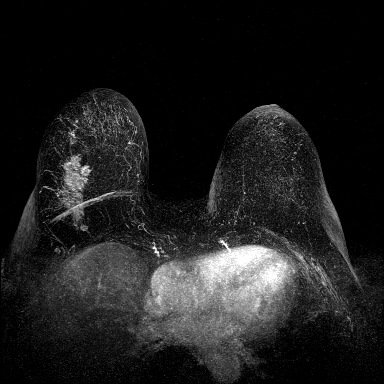

[Series 9: fl3d post-cm 3min · axial · 1.2mm · 0.94mm/px · z∈[-86,+85]mm · 5 of 144 slices shown]
[im 1/144]
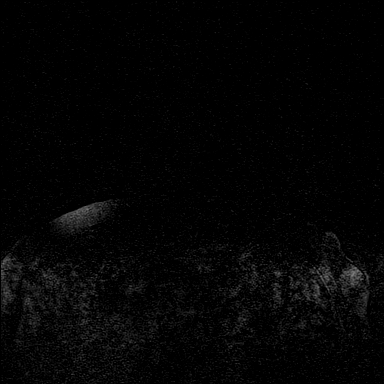
[im 36/144]
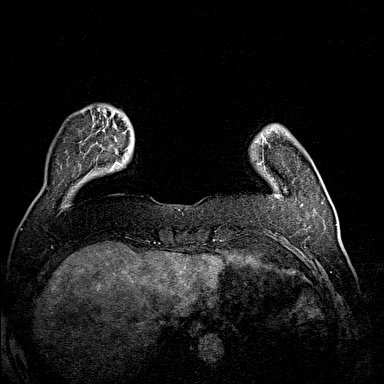
[im 72/144]
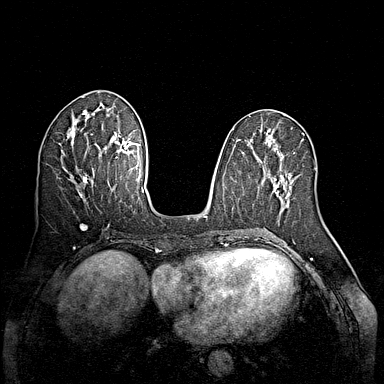
[im 108/144]
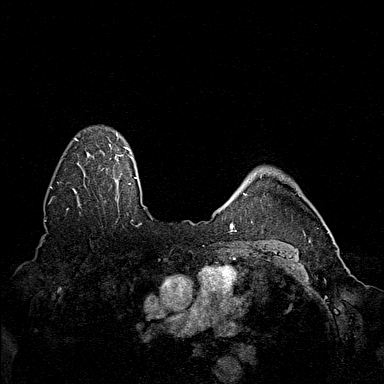
[im 144/144]
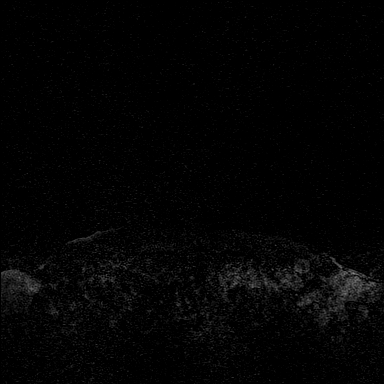

[31 of 48 positions shown; findings below may reference images not displayed]

Three-dimensional MR images were rendered by post-processing of the
original MR data on an independent workstation. The
three-dimensional MR images were interpreted, and findings are
reported in the following complete MRI report for this study. Three
dimensional images were evaluated at the independent interpreting
workstation using the DynaCAD thin client.
FINDINGS: Breast composition: b. Scattered fibroglandular tissue.

Background parenchymal enhancement: Mild

Right breast: There is susceptibility artifact indicating a biopsy
marking clip in the upper outer right breast at the site of recent
biopsy. Associated with the biopsy marking clip there is suspicious
non mass enhancement measuring 7.0 x 2.2 x 3.4 cm, consistent with
biopsy proven malignancy (series 7, image 54). There is a similar
appearing but smaller area inferior and slightly medial in the
central breast measuring 2.5 x 0.9 x 1.2 cm (series 7, image 77).
There is a third subtle area of linear non mass enhancement in the
anterior outer right breast spanning 1.7 cm (series 7, image 74).

Left breast: No mass or abnormal enhancement.

Lymph nodes:

There are 2 small intramammary lymph nodes in the upper central and
upper outer right breast (series 7, images 72 and 62), which are
benign in appearance though asymmetric compared to the left side and
the superior central lymph node is not far from the patient's known
malignancy.

There is susceptibility artifact in the right axilla at the site of
prior biopsy. There are no abnormal axillary lymph nodes
bilaterally.

Ancillary findings:  None.
IMPRESSION: 1. Large area of non mass enhancement in the upper outer right
breast measuring 7.0 x 2.2 x 3.4 cm, consistent with biopsy proven
malignancy.
2. There is a similar appearing suspicious area of non mass
enhancement in the central breast measuring 2.5 x 0.9 x 1.2 cm.
There is a third area of suspicious linear non mass enhancement in
the anterior outer right breast spanning 1.7 cm.
3. Two small intramammary lymph nodes in the upper central and upper
outer right breast. These are benign appearance though asymmetric
compared to the left side and somewhat suspicious given proximity to
the patient's known malignancy.
4. No MRI evidence of malignancy in the left breast. MRI guided
biopsy of the linear non mass enhancement in the anterior outer
right breast.

RECOMMENDATION:
1. If breast conservation is being considered recommend MRI guided
biopsy of the non mass enhancement in the central and anterolateral
right breast.

[DATE] consider second-look ultrasound and possible biopsy of the
intramammary lymph node in the superior central right breast, if
this will alter management.

BI-RADS CATEGORY  4: Suspicious.

## 2021-12-20 ENCOUNTER — Encounter: Payer: Self-pay | Admitting: Hematology and Oncology

## 2021-12-21 ENCOUNTER — Other Ambulatory Visit: Payer: Self-pay

## 2021-12-21 ENCOUNTER — Inpatient Hospital Stay: Payer: Medicare (Managed Care)

## 2021-12-21 ENCOUNTER — Inpatient Hospital Stay: Payer: Medicare (Managed Care) | Attending: Hematology and Oncology

## 2021-12-21 ENCOUNTER — Other Ambulatory Visit: Payer: Self-pay | Admitting: *Deleted

## 2021-12-21 ENCOUNTER — Inpatient Hospital Stay (HOSPITAL_BASED_OUTPATIENT_CLINIC_OR_DEPARTMENT_OTHER): Payer: Medicare (Managed Care) | Admitting: Hematology and Oncology

## 2021-12-21 VITALS — BP 120/91 | HR 80 | Temp 97.7°F | Resp 16

## 2021-12-21 VITALS — BP 133/53 | HR 81 | Temp 97.2°F | Resp 18 | Ht 65.5 in | Wt 144.9 lb

## 2021-12-21 DIAGNOSIS — C50411 Malignant neoplasm of upper-outer quadrant of right female breast: Secondary | ICD-10-CM

## 2021-12-21 DIAGNOSIS — Z9011 Acquired absence of right breast and nipple: Secondary | ICD-10-CM | POA: Diagnosis not present

## 2021-12-21 DIAGNOSIS — Z171 Estrogen receptor negative status [ER-]: Secondary | ICD-10-CM

## 2021-12-21 DIAGNOSIS — Z9221 Personal history of antineoplastic chemotherapy: Secondary | ICD-10-CM | POA: Insufficient documentation

## 2021-12-21 DIAGNOSIS — Z923 Personal history of irradiation: Secondary | ICD-10-CM | POA: Diagnosis not present

## 2021-12-21 DIAGNOSIS — Z79899 Other long term (current) drug therapy: Secondary | ICD-10-CM | POA: Diagnosis not present

## 2021-12-21 DIAGNOSIS — Z5111 Encounter for antineoplastic chemotherapy: Secondary | ICD-10-CM | POA: Diagnosis present

## 2021-12-21 DIAGNOSIS — Z95828 Presence of other vascular implants and grafts: Secondary | ICD-10-CM

## 2021-12-21 LAB — CBC WITH DIFFERENTIAL (CANCER CENTER ONLY)
Abs Immature Granulocytes: 0.04 10*3/uL (ref 0.00–0.07)
Basophils Absolute: 0.1 10*3/uL (ref 0.0–0.1)
Basophils Relative: 1 %
Eosinophils Absolute: 0.5 10*3/uL (ref 0.0–0.5)
Eosinophils Relative: 7 %
HCT: 30.7 % — ABNORMAL LOW (ref 36.0–46.0)
Hemoglobin: 10 g/dL — ABNORMAL LOW (ref 12.0–15.0)
Immature Granulocytes: 1 %
Lymphocytes Relative: 21 %
Lymphs Abs: 1.3 10*3/uL (ref 0.7–4.0)
MCH: 28.2 pg (ref 26.0–34.0)
MCHC: 32.6 g/dL (ref 30.0–36.0)
MCV: 86.7 fL (ref 80.0–100.0)
Monocytes Absolute: 0.6 10*3/uL (ref 0.1–1.0)
Monocytes Relative: 10 %
Neutro Abs: 3.7 10*3/uL (ref 1.7–7.7)
Neutrophils Relative %: 60 %
Platelet Count: 305 10*3/uL (ref 150–400)
RBC: 3.54 MIL/uL — ABNORMAL LOW (ref 3.87–5.11)
RDW: 12.8 % (ref 11.5–15.5)
WBC Count: 6.2 10*3/uL (ref 4.0–10.5)
nRBC: 0 % (ref 0.0–0.2)

## 2021-12-21 LAB — CMP (CANCER CENTER ONLY)
ALT: 11 U/L (ref 0–44)
AST: 13 U/L — ABNORMAL LOW (ref 15–41)
Albumin: 3.9 g/dL (ref 3.5–5.0)
Alkaline Phosphatase: 120 U/L (ref 38–126)
Anion gap: 9 (ref 5–15)
BUN: 13 mg/dL (ref 8–23)
CO2: 26 mmol/L (ref 22–32)
Calcium: 9.1 mg/dL (ref 8.9–10.3)
Chloride: 106 mmol/L (ref 98–111)
Creatinine: 0.73 mg/dL (ref 0.44–1.00)
GFR, Estimated: >60 mL/min (ref >60)
Glucose, Bld: 147 mg/dL — ABNORMAL HIGH (ref 70–99)
Potassium: 3.5 mmol/L (ref 3.5–5.1)
Sodium: 141 mmol/L (ref 135–145)
Total Bilirubin: 0.3 mg/dL (ref 0.3–1.2)
Total Protein: 6.8 g/dL (ref 6.5–8.1)

## 2021-12-21 MED ORDER — SODIUM CHLORIDE 0.9 % IV SOLN
420.0000 mg | Freq: Once | INTRAVENOUS | Status: AC
  Administered 2021-12-21: 420 mg via INTRAVENOUS
  Filled 2021-12-21: qty 14

## 2021-12-21 MED ORDER — SODIUM CHLORIDE 0.9 % IV SOLN: Freq: Once | INTRAVENOUS | Status: AC

## 2021-12-21 MED ORDER — DIPHENHYDRAMINE HCL 25 MG PO CAPS
50.0000 mg | ORAL_CAPSULE | Freq: Once | ORAL | Status: AC
  Administered 2021-12-21: 50 mg via ORAL
  Filled 2021-12-21: qty 2

## 2021-12-21 MED ORDER — SODIUM CHLORIDE 0.9% FLUSH
10.0000 mL | Freq: Once | INTRAVENOUS | Status: AC
  Administered 2021-12-21: 10 mL

## 2021-12-21 MED ORDER — HEPARIN SOD (PORK) LOCK FLUSH 100 UNIT/ML IV SOLN
500.0000 [IU] | Freq: Once | INTRAVENOUS | Status: AC | PRN
  Administered 2021-12-21: 500 [IU]

## 2021-12-21 MED ORDER — SODIUM CHLORIDE 0.9% FLUSH
10.0000 mL | INTRAVENOUS | Status: DC | PRN
  Administered 2021-12-21: 10 mL

## 2021-12-21 MED ORDER — TRASTUZUMAB-DKST CHEMO 150 MG IV SOLR
400.0000 mg | Freq: Once | INTRAVENOUS | Status: AC
  Administered 2021-12-21: 400 mg via INTRAVENOUS
  Filled 2021-12-21: qty 19.05

## 2021-12-21 MED ORDER — ACETAMINOPHEN 325 MG PO TABS: 650.0000 mg | ORAL_TABLET | Freq: Once | ORAL | Status: DC

## 2021-12-21 NOTE — Assessment & Plan Note (Addendum)
11/17/2020:Work-up performed at [facility]: Palpable right breast mass. Mammogram and ultrasound 11/10/2020: Spiculated 4.9 cm mass UOQ right breast with microcalcifications, biopsy revealed IDC ER/PR negative, HER2 positive 3+ by IHC, axillary lymph node positive (2 lymph nodes were detected)  Genetics were negative  Treatment Planbased on multidisciplinary tumor board: 1. Neoadjuvant chemotherapy with Kevil Perjeta 6 cycles(started 12/29/2020-04/13/2021)followed by Herceptin Perjeta maintenance for 1 year 2.06/29/2021:Right mastectomy: No residual cancer identified, 0/4 lymph nodes negative, complete pathologic response 3. Followed by adjuvant radiation therapy4/11/2021-10/12/2021 ------------------------------------------------------------------------------------------------------------------------- Current treatment:Herceptin Perjeta maintenance(today is her last treatment) Toxicities: None Ongoing radiation: Until 10/12/2021  Diffuse arthralgias and myalgias: The symptoms have come back since chemotherapy has been discontinued Requested port removal  Breast cancer surveillance: Mammograms in the left breast will need to be ordered  Return to clinic in 6 months for follow-up

## 2021-12-21 NOTE — Patient Instructions (Signed)
[facility] ONCOLOGY  Discharge Instructions: Thank you for choosing [facility] to provide your oncology and hematology care.   If you have a lab appointment with the [facility], please go directly to the [facility] and check in at the registration area.   Wear comfortable clothing and clothing appropriate for easy access to any Portacath or PICC line.   We strive to give you quality time with your provider. You may need to reschedule your appointment if you arrive late (15 or more minutes).  Arriving late affects you and other patients whose appointments are after yours.  Also, if you miss three or more appointments without notifying the office, you may be dismissed from the clinic at the provider's discretion.      For prescription refill requests, have your pharmacy contact our office and allow 72 hours for refills to be completed.    Today you received the following chemotherapy and/or immunotherapy agents: trastuzumab and pertuzumab      To help prevent nausea and vomiting after your treatment, we encourage you to take your nausea medication as directed.  BELOW ARE SYMPTOMS THAT SHOULD BE REPORTED IMMEDIATELY: *FEVER GREATER THAN 100.4 F (38 C) OR HIGHER *CHILLS OR SWEATING *NAUSEA AND VOMITING THAT IS NOT CONTROLLED WITH YOUR NAUSEA MEDICATION *UNUSUAL SHORTNESS OF BREATH *UNUSUAL BRUISING OR BLEEDING *URINARY PROBLEMS (pain or burning when urinating, or frequent urination) *BOWEL PROBLEMS (unusual diarrhea, constipation, pain near the anus) TENDERNESS IN MOUTH AND THROAT WITH OR WITHOUT PRESENCE OF ULCERS (sore throat, sores in mouth, or a toothache) UNUSUAL RASH, SWELLING OR PAIN  UNUSUAL VAGINAL DISCHARGE OR ITCHING   Items with * indicate a potential emergency and should be followed up as soon as possible or go to the Emergency Department if any problems should occur.  Please show the CHEMOTHERAPY ALERT CARD or IMMUNOTHERAPY ALERT  CARD at check-in to the Emergency Department and triage nurse.  Should you have questions after your visit or need to cancel or reschedule your appointment, please contact [facility]  Dept: [phone]  and follow the prompts.  Office hours are 8:00 a.m. to 4:30 p.m. Monday - Friday. Please note that voicemails left after 4:00 p.m. may not be returned until the following business day.  We are closed weekends and major holidays. You have access to a nurse at all times for urgent questions. Please call the main number to the clinic Dept: [phone] and follow the prompts.   For any non-urgent questions, you may also contact your provider using MyChart. We now offer e-Visits for anyone 26 and older to request care online for non-urgent symptoms. For details visit [email or web address].   Also download the MyChart app! Go to the app store, search "MyChart", open the app, select [facility], and log in with your MyChart username and password.  Masks are optional in the cancer centers. If you would like for your care team to wear a mask while they are taking care of you, please let them know. For doctor visits, patients may have with them one support person who is at least 75 years old. At this time, visitors are not allowed in the infusion area.

## 2021-12-22 ENCOUNTER — Telehealth: Payer: Self-pay | Admitting: Hematology and Oncology

## 2021-12-22 ENCOUNTER — Other Ambulatory Visit (HOSPITAL_COMMUNITY): Payer: Self-pay | Admitting: *Deleted

## 2021-12-22 NOTE — Telephone Encounter (Signed)
Scheduled appointment per 8/1 los. Patient is aware.

## 2021-12-23 ENCOUNTER — Other Ambulatory Visit: Payer: Self-pay

## 2021-12-24 ENCOUNTER — Encounter: Payer: Self-pay | Admitting: *Deleted

## 2021-12-24 ENCOUNTER — Ambulatory Visit (INDEPENDENT_AMBULATORY_CARE_PROVIDER_SITE_OTHER): Payer: Medicare (Managed Care) | Admitting: Plastic Surgery

## 2021-12-24 ENCOUNTER — Other Ambulatory Visit (HOSPITAL_COMMUNITY): Payer: Medicare Other

## 2021-12-24 ENCOUNTER — Encounter: Payer: Self-pay | Admitting: Plastic Surgery

## 2021-12-24 DIAGNOSIS — N651 Disproportion of reconstructed breast: Secondary | ICD-10-CM | POA: Insufficient documentation

## 2021-12-24 DIAGNOSIS — Z9889 Other specified postprocedural states: Secondary | ICD-10-CM

## 2021-12-24 DIAGNOSIS — Z171 Estrogen receptor negative status [ER-]: Secondary | ICD-10-CM | POA: Diagnosis not present

## 2021-12-24 DIAGNOSIS — C50411 Malignant neoplasm of upper-outer quadrant of right female breast: Secondary | ICD-10-CM

## 2021-12-24 NOTE — Progress Notes (Signed)
Patient ID: Marie Day, female    DOB: 06-Jul-1945, 76 y.o.   MRN: [id number]   Chief Complaint  Patient presents with   Follow-up   Breast Problem    The patient is a 76 year old female with about her breast reconstruction.  She was diagnosed with right breast cancer in 2022.  She was treated by Dr. Brantley Stage.  She underwent a right-sided mastectomy with expander placement above the pectoralis muscle.  In February 2023.  In March 2023 she had a total of 320 cc in the 375 cc expander.  She went on to have radiation which ended in May.  She is pleased with the size and would like since better symmetry with a lift on the left side.  She understands that waiting a minimum of 6 months is the safest for exchange to her implant.  She has some activities planned between now and the end of the year most importantly her dad's birthday who lives in [place].    Review of Systems  Constitutional: Negative.   Eyes: Negative.   Respiratory: Negative.    Cardiovascular: Negative.  Negative for leg swelling.  Gastrointestinal: Negative.   Endocrine: Negative.   Genitourinary: Negative.   Musculoskeletal: Negative.     Past Medical History:  Diagnosis Date   Arthritis    Cancer [facility])     Past Surgical History:  Procedure Laterality Date   BREAST RECONSTRUCTION WITH PLACEMENT OF TISSUE EXPANDER AND FLEX HD (ACELLULAR HYDRATED DERMIS) Right 06/29/2021   Procedure: RIGHT BREAST RECONSTRUCTION WITH PLACEMENT OF TISSUE EXPANDER AND FLEX HD (ACELLULAR HYDRATED DERMIS);  Surgeon: Cindra Presume, MD;  Location: [facility];  Service: Plastics;  Laterality: Right;   MASTECTOMY W/ SENTINEL NODE BIOPSY Right 06/29/2021   Procedure: RIGHT MASTECTOMY WITH SENTINEL LYMPH NODE BIOPSY;  Surgeon: Erroll Luna, MD;  Location: [facility];  Service: General;  Laterality: Right;   WISDOM TOOTH EXTRACTION        Current Outpatient Medications:    aspirin EC 325 MG tablet, Take 325 mg  by mouth daily as needed., Disp: , Rfl:    famotidine (PEPCID) 20 MG tablet, Take 20 mg by mouth 2 (two) times daily., Disp: , Rfl:    hydrOXYzine (ATARAX) 25 MG tablet, Take 25 mg by mouth 3 (three) times daily as needed., Disp: , Rfl:    lidocaine-prilocaine (EMLA) cream, Apply to affected area once, Disp: 30 g, Rfl: 3   Objective:   There were no vitals filed for this visit.  Physical Exam Vitals and nursing note reviewed.  Constitutional:      Appearance: Normal appearance.  HENT:     Head: Normocephalic and atraumatic.  Cardiovascular:     Rate and Rhythm: Normal rate.     Pulses: Normal pulses.  Pulmonary:     Effort: Pulmonary effort is normal.  Abdominal:     Palpations: Abdomen is soft.  Skin:    Capillary Refill: Capillary refill takes less than 2 seconds.     Coloration: Skin is not jaundiced or pale.     Findings: No bruising or erythema.  Neurological:     Mental Status: She is alert and oriented to person, place, and time.  Psychiatric:        Mood and Affect: Mood normal.        Behavior: Behavior normal.        Thought Content: Thought content normal.        Judgment: Judgment  normal.     Assessment & Plan:  S/P breast reconstruction  Malignant neoplasm of upper-outer quadrant of right breast in female, estrogen receptor negative ([place])  Breast asymmetry following reconstructive surgery  Plan for right expander removal with implant placement approximately 320 cc.  A left mastopexy and removal of the Port-A-Cath.  We will have a televisit in December with the plan for January or February surgery.  Pictures were obtained of the patient and placed in the chart with the patient's or guardian's permission.   [place], DO

## 2021-12-27 ENCOUNTER — Other Ambulatory Visit: Payer: Self-pay | Admitting: Hematology and Oncology

## 2021-12-27 DIAGNOSIS — Z171 Estrogen receptor negative status [ER-]: Secondary | ICD-10-CM

## 2021-12-28 ENCOUNTER — Telehealth: Payer: Self-pay | Admitting: *Deleted

## 2021-12-28 NOTE — Telephone Encounter (Signed)
Received VM from pt.  RN attempt x1 to return call.  No answer, LVM for pt to return call to the office.  °

## 2021-12-30 ENCOUNTER — Encounter: Payer: Self-pay | Admitting: Hematology and Oncology

## 2022-01-18 ENCOUNTER — Encounter: Payer: Self-pay | Admitting: Hematology and Oncology

## 2022-01-25 ENCOUNTER — Other Ambulatory Visit: Payer: Self-pay | Admitting: Hematology and Oncology

## 2022-01-25 ENCOUNTER — Other Ambulatory Visit: Payer: PRIVATE HEALTH INSURANCE

## 2022-01-25 ENCOUNTER — Ambulatory Visit: Payer: Medicare Other

## 2022-01-25 DIAGNOSIS — Z13820 Encounter for screening for osteoporosis: Secondary | ICD-10-CM

## 2022-01-26 ENCOUNTER — Ambulatory Visit: Payer: PRIVATE HEALTH INSURANCE | Admitting: Plastic Surgery

## 2022-01-26 ENCOUNTER — Ambulatory Visit: Payer: Medicare Other

## 2022-01-27 ENCOUNTER — Ambulatory Visit: Payer: Medicare Other | Admitting: Physician Assistant

## 2022-01-29 ENCOUNTER — Other Ambulatory Visit: Payer: Self-pay

## 2022-01-31 ENCOUNTER — Ambulatory Visit: Payer: PRIVATE HEALTH INSURANCE

## 2022-02-01 ENCOUNTER — Ambulatory Visit: Payer: PRIVATE HEALTH INSURANCE | Admitting: Plastic Surgery

## 2022-02-07 ENCOUNTER — Ambulatory Visit (INDEPENDENT_AMBULATORY_CARE_PROVIDER_SITE_OTHER): Payer: Medicare (Managed Care) | Admitting: Surgical

## 2022-02-07 DIAGNOSIS — N651 Disproportion of reconstructed breast: Secondary | ICD-10-CM

## 2022-02-07 DIAGNOSIS — C50411 Malignant neoplasm of upper-outer quadrant of right female breast: Secondary | ICD-10-CM

## 2022-02-07 DIAGNOSIS — Z171 Estrogen receptor negative status [ER-]: Secondary | ICD-10-CM

## 2022-02-07 DIAGNOSIS — Z9889 Other specified postprocedural states: Secondary | ICD-10-CM

## 2022-02-07 NOTE — Progress Notes (Signed)
Patient is a 76 year old female status post right breast reconstruction with current tissue expander in place on the right side.  She presented today for left chest wall port removal, she previously discussed with Dr. Marla Roe plans for removing this in the office.  She was scheduled to be here last week, however was exposed to COVID potentially, reports that she ultimately did not test positive for this or have any symptoms but did not come to her appointment at that time due to the exposure.  She presented today thinking that the port was going to be removed.  We discussed that I do not remove ports and that is something that Dr. Marla Roe would do.  I will reach out to Dr. Marla Roe and plan for patient to be reevaluated in the clinic.  Patient also stated that if the port needs to be removed in the operating room that she is willing to wait until January February for her exchange surgery.

## 2022-02-08 ENCOUNTER — Encounter: Payer: Self-pay | Admitting: Physician Assistant

## 2022-02-08 ENCOUNTER — Ambulatory Visit (INDEPENDENT_AMBULATORY_CARE_PROVIDER_SITE_OTHER): Payer: Medicare (Managed Care) | Admitting: Physician Assistant

## 2022-02-08 DIAGNOSIS — M791 Myalgia, unspecified site: Secondary | ICD-10-CM

## 2022-02-08 DIAGNOSIS — M2550 Pain in unspecified joint: Secondary | ICD-10-CM | POA: Insufficient documentation

## 2022-02-08 NOTE — Addendum Note (Signed)
Addended byYong Channel, Jewelene Mairena L on: 02/08/2022 11:10 AM   Modules accepted: Orders

## 2022-02-08 NOTE — Progress Notes (Signed)
Office Visit Note   Patient: Marie Day           Date of Birth: 04/30/1946           MRN: [id number] Visit Date: 02/08/2022              Requested by: Marie Ny, PA-C [place],  [place] [address] PCP: Marie Ny, PA-C  Mult joint muscle pain    HPI: Mrs. 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  In January 2022 she began noticing multiple muscle aches that were significant enough that sometimes it would be difficult to stand or walk.  It was in many of her joints including her hips knees and shoulders.  She denies any illness at the time or any fever or chills.  She is on very few medications and does not take any statin drugs.  She does have a family history of polymyalgia rheumatica and her father about the symptoms at about her age.  Her father still alive and doing well.  She says that her PA tested for her for this though I cannot find the results and she was told it was negative.  She presented to a primary care provider with these complaints but had also noticed a lump in her breast.  Attention was paid to this as it was deemed the more imminent issue.  She was diagnosed with breast cancer and underwent mastectomy and reconstruction.  A PET scan done in 2022 did show multiple inflammation in her sternoclavicular joints her shoulders her hips.  She did complete chemotherapy after her mastectomy and interestingly as soon as she started chemotherapy treatment the myalgias disappeared.  Her oncologist was thought that this was due to the chemotherapeutic agents being significantly anti-inflammatory.  When she finished chemotherapy the myalgias returned.   Assessment & Plan: Visit Diagnoses:  1. Polyarthralgia   2. Myalgia     Plan: Patient referred for myalgia.  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  She does  have a history of poly myalgia rheumatica in her father that started at about her age.  Tests were negative, though I do not have this on file.  She has no other family history of lupus or inflammatory arthropathy.  I will get inflammatory labs and I think it would be prudent to refer her to a rheumatologist to see if there is a systemic cause for her pain and to treat it.  Certainly this could be fibromyalgia but important to rule other things out first.  She is in agreement with this plan.  Follow-Up Instructions: No follow-ups on file.   Ortho Exam  Patient is alert, oriented, no adenopathy, well-dressed, normal affect, normal respiratory effort. Patient is pleasant to exam.  She has full range of motion of all of her joints.  Strength is 5 out of 5.  She does have some altered sensation in the tips of her fingers and her feet secondary to her chemotherapy.  She did not have this prior to chemotherapy she otherwise appears well pleasant to examination  Imaging: No results found. No images are attached to the encounter.  Labs: No results found for: "HGBA1C", "ESRSEDRATE", "CRP", "LABURIC", "REPTSTATUS", "GRAMSTAIN", "CULT", "LABORGA"   Lab Results  Component Value Date   ALBUMIN 3.9 12/21/2021   ALBUMIN 4.1 11/09/2021   ALBUMIN 3.8 09/28/2021    No results  found for: "MG" No results found for: "VD25OH"  No results found for: "PREALBUMIN"    Latest Ref Rng & Units 12/21/2021   10:42 AM 11/09/2021   10:11 AM 09/28/2021   11:26 AM  CBC EXTENDED  WBC 4.0 - 10.5 K/uL 6.2  5.3  5.6   RBC 3.87 - 5.11 MIL/uL 3.54  3.62  3.57   Hemoglobin 12.0 - 15.0 g/dL 10.0  10.5  10.2   HCT 36.0 - 46.0 % 30.7  31.9  31.2   Platelets 150 - 400 K/uL 305  257  247   NEUT# 1.7 - 7.7 K/uL 3.7  3.2  3.3   Lymph# 0.7 - 4.0 K/uL 1.3  1.3  1.4      There is no height or weight on file to calculate BMI.  Orders:  Orders Placed This Encounter  Procedures   Ambulatory referral to Rheumatology   No  orders of the defined types were placed in this encounter.    Procedures: No procedures performed  Clinical Data: No additional findings.  ROS:  All other systems negative, except as noted in the HPI. Review of Systems  Objective: Vital Signs: There were no vitals taken for this visit.  Specialty Comments:  No specialty comments available.  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 No pertinent family history.  Past Surgical History:  Procedure Laterality Date   BREAST RECONSTRUCTION WITH PLACEMENT OF TISSUE EXPANDER AND FLEX HD (ACELLULAR HYDRATED DERMIS) Right 06/29/2021   Procedure: RIGHT BREAST RECONSTRUCTION WITH PLACEMENT OF TISSUE EXPANDER AND FLEX HD (ACELLULAR HYDRATED DERMIS);  Surgeon: Cindra Presume, MD;  Location: [place];  Service: Plastics;  Laterality: Right;   MASTECTOMY W/ SENTINEL NODE BIOPSY Right 06/29/2021   Procedure: RIGHT MASTECTOMY WITH SENTINEL LYMPH NODE BIOPSY;  Surgeon: Erroll Luna, MD;  Location: [place];  Service: General;  Laterality: Right;   WISDOM TOOTH EXTRACTION     Social History   Occupational History   Not on file  Tobacco Use   Smoking status: Never   Smokeless tobacco: Never  Vaping Use   Vaping Use: Not on file  Substance and Sexual Activity   Alcohol use: Never   Drug use: Never   Sexual activity: Not on file

## 2022-02-12 LAB — ANA: Anti Nuclear Antibody (ANA): POSITIVE — AB

## 2022-02-12 LAB — SEDIMENTATION RATE: Sed Rate: 41 mm/h — ABNORMAL HIGH (ref 0–30)

## 2022-02-12 LAB — ANTI-NUCLEAR AB-TITER (ANA TITER): ANA Titer 1: 1:40 {titer} — ABNORMAL HIGH

## 2022-02-12 LAB — URIC ACID: Uric Acid, Serum: 4.3 mg/dL (ref 2.5–7.0)

## 2022-02-12 LAB — C-REACTIVE PROTEIN: CRP: 21.9 mg/L — ABNORMAL HIGH (ref <8.0)

## 2022-02-12 LAB — RHEUMATOID FACTOR: Rheumatoid fact SerPl-aCnc: <14 IU/mL (ref <14)

## 2022-02-14 ENCOUNTER — Telehealth: Payer: Self-pay | Admitting: Physician Assistant

## 2022-02-14 NOTE — Telephone Encounter (Signed)
I contacted the patient today.  Her inflammatory panel is positive and many areas.  Given her history and her family history of polymyalgia rheumatica I did refer her to [facility] rheumatology.

## 2022-02-15 ENCOUNTER — Encounter: Payer: Self-pay | Admitting: Hematology and Oncology

## 2022-02-17 ENCOUNTER — Other Ambulatory Visit: Payer: PRIVATE HEALTH INSURANCE

## 2022-02-17 ENCOUNTER — Encounter: Payer: Self-pay | Admitting: Hematology and Oncology

## 2022-02-22 ENCOUNTER — Ambulatory Visit: Payer: PRIVATE HEALTH INSURANCE

## 2022-02-23 ENCOUNTER — Ambulatory Visit: Payer: Medicare (Managed Care) | Attending: Surgery

## 2022-02-23 VITALS — Wt 146.1 lb

## 2022-02-23 DIAGNOSIS — Z483 Aftercare following surgery for neoplasm: Secondary | ICD-10-CM | POA: Insufficient documentation

## 2022-02-23 NOTE — Therapy (Signed)
  OUTPATIENT PHYSICAL THERAPY SOZO SCREENING NOTE   Patient Name: Marie Day MRN: [id number] DOB:May 26, 1945, 76 y.o., female Today's Date: 02/23/2022  PCP: Jorge Ny, PA-C REFERRING PROVIDER: Erroll Luna, MD   PT End of Session - 02/23/22 1111     Visit Number 10   # unchanged due to screen only   PT Start Time 1106    PT Stop Time 1120    PT Time Calculation (min) 14 min    Activity Tolerance Patient tolerated treatment well    Behavior During Therapy WFL for tasks assessed/performed             Past Medical History:  Diagnosis Date   Arthritis    Cancer ([place])    Past Surgical History:  Procedure Laterality Date   BREAST RECONSTRUCTION WITH PLACEMENT OF TISSUE EXPANDER AND FLEX HD (ACELLULAR HYDRATED DERMIS) Right 06/29/2021   Procedure: RIGHT BREAST RECONSTRUCTION WITH PLACEMENT OF TISSUE EXPANDER AND FLEX HD (ACELLULAR HYDRATED DERMIS);  Surgeon: Cindra Presume, MD;  Location: [place];  Service: Plastics;  Laterality: Right;   MASTECTOMY W/ SENTINEL NODE BIOPSY Right 06/29/2021   Procedure: RIGHT MASTECTOMY WITH SENTINEL LYMPH NODE BIOPSY;  Surgeon: Erroll Luna, MD;  Location: [place];  Service: General;  Laterality: Right;   [place] EXTRACTION     Patient Active Problem List   Diagnosis Date Noted   Myalgia 02/08/2022   Breast asymmetry following reconstructive surgery 12/24/2021   S/P breast reconstruction 06/29/2021   Port-A-Cath in place 01/18/2021   Malignant neoplasm of upper-outer quadrant of right breast in female, estrogen receptor negative ([place]) 12/21/2020   Anemia 10/27/2020   Prediabetes 10/27/2020   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   Unspecified age-related cataract 12/14/2015    REFERRING DIAG: right breast cancer at risk for lymphedema  THERAPY DIAG: Aftercare following surgery for neoplasm  PERTINENT HISTORY: [name] with expander on 06/29/21 with 0/4 LN positive.  Drains removed 07/15/21. Will be having radiation consult 07/27/21  and will continue HP maintenance every 3 weeks.   PRECAUTIONS: right UE Lymphedema risk, None  SUBJECTIVE: Pt returns for her 3 month L-Dex screen.  PAIN:  Are you having pain? No  SOZO SCREENING: Patient was assessed today using the SOZO machine to determine the lymphedema index score. This was compared to her baseline score. It was determined that she is within the recommended range when compared to her baseline and no further action is needed at this time. She will continue SOZO screenings. These are done every 3 months for 2 years post operatively followed by every 6 months for 2 years, and then annually.  Spent time answering pts questions within scope of practice regarding why the plastic surgeon will 6-12 months after radiation to do her implant exchange surgery due to the amount of healing that needs to occur (from radiation) before undergoing more trauma for surgery.   L-DEX FLOWSHEETS - 02/23/22 1100       L-DEX LYMPHEDEMA SCREENING   Measurement Type Unilateral    L-DEX MEASUREMENT EXTREMITY Upper Extremity    POSITION  Standing    DOMINANT SIDE Right    At Risk Side Right    BASELINE SCORE (UNILATERAL) -3.8    L-DEX SCORE (UNILATERAL) 1.3    VALUE CHANGE (UNILAT) 5.1              Otelia Limes, PTA 02/23/2022, 11:30 AM

## 2022-03-03 ENCOUNTER — Telehealth: Payer: Self-pay

## 2022-03-03 NOTE — Telephone Encounter (Signed)
S[facility] at [place] would like lab results faxed to [phone].  Please advise.  Thank you.

## 2022-03-03 NOTE — Telephone Encounter (Signed)
Faxed

## 2022-04-26 ENCOUNTER — Encounter: Payer: Self-pay | Admitting: Plastic Surgery

## 2022-04-26 ENCOUNTER — Ambulatory Visit (INDEPENDENT_AMBULATORY_CARE_PROVIDER_SITE_OTHER): Payer: Medicare (Managed Care) | Admitting: Plastic Surgery

## 2022-04-26 DIAGNOSIS — Z171 Estrogen receptor negative status [ER-]: Secondary | ICD-10-CM | POA: Diagnosis not present

## 2022-04-26 DIAGNOSIS — C50411 Malignant neoplasm of upper-outer quadrant of right female breast: Secondary | ICD-10-CM

## 2022-04-26 DIAGNOSIS — N651 Disproportion of reconstructed breast: Secondary | ICD-10-CM

## 2022-04-26 DIAGNOSIS — Z9889 Other specified postprocedural states: Secondary | ICD-10-CM

## 2022-04-26 DIAGNOSIS — Z923 Personal history of irradiation: Secondary | ICD-10-CM | POA: Diagnosis not present

## 2022-04-26 NOTE — Progress Notes (Signed)
   Subjective:    Patient ID: Marie Day, female    DOB: 1945-08-17, 76 y.o.   MRN: [id number]  The patient is a 76 year old female joining me by phone for further discussion about her breast reconstruction.  She was diagnosed with right-sided breast cancer in 2022 and originally treated by Dr. Brantley Stage and Dr. Claudia Desanctis.  She had a right sided mastectomy with expander placement above the muscle in February 2023.  She has 320 cc and a 375 cc expander.  Her radiation ended the end of May.  She is pleased with her size and feels like she does not really need to be any larger.  She would like her Port-A-Cath removed at the same time.  She would also like a left mastopexy for symmetry.  She is still pleased with moving forward for February.    Review of Systems  Constitutional: Negative.   HENT: Negative.    Eyes: Negative.   Respiratory: Negative.    Cardiovascular: Negative.   Gastrointestinal: Negative.   Endocrine: Negative.   Genitourinary: Negative.   Musculoskeletal: Negative.        Objective:   Physical Exam        Assessment & Plan:     ICD-10-CM   1. Breast asymmetry following reconstructive surgery  N65.1     2. S/P breast reconstruction  Z98.890     3. Malignant neoplasm of upper-outer quadrant of right breast in female, estrogen receptor negative ([place])  C50.411    Z17.1        I connected with  Marie Day on 04/26/22 by phone and verified that I am speaking with the correct person using two identifiers.  The patient was at home and I was at the office.  We spent 10 minutes in discussion. The plan is for removal of the right breast expander and placement of a silicone implant.  We will also do a left mastopexy at the same time.  We will shoot for around February or March.   I discussed the limitations of evaluation and management by telemedicine. The patient expressed understanding and agreed to proceed.

## 2022-05-23 ENCOUNTER — Encounter: Payer: Self-pay | Admitting: Hematology and Oncology

## 2022-05-27 ENCOUNTER — Other Ambulatory Visit: Payer: Self-pay | Admitting: *Deleted

## 2022-05-27 DIAGNOSIS — Z171 Estrogen receptor negative status [ER-]: Secondary | ICD-10-CM

## 2022-05-28 ENCOUNTER — Telehealth: Payer: Self-pay | Admitting: *Deleted

## 2022-05-28 NOTE — Telephone Encounter (Signed)
Auth submitted for [id number]. No auth req for [place]: [id number]

## 2022-05-30 ENCOUNTER — Ambulatory Visit: Payer: No Typology Code available for payment source | Attending: Surgery

## 2022-05-30 ENCOUNTER — Telehealth: Payer: Self-pay | Admitting: *Deleted

## 2022-05-30 VITALS — Wt 152.4 lb

## 2022-05-30 DIAGNOSIS — Z483 Aftercare following surgery for neoplasm: Secondary | ICD-10-CM

## 2022-05-30 NOTE — Telephone Encounter (Signed)
Call received from Marie Day regarding her right side expander She had a tele visit with Dr. Keturah Barre on 12/5 and is routed for surgery "removal of right breast expander, placement of silicone implant on right and left mastopexy for symmetry". She states that she is having tenderness and sensitivity under her right arm. She had the right side expander placed 06/29/2021. She asked if if this is normal or any cause for concern.

## 2022-05-30 NOTE — Therapy (Signed)
  OUTPATIENT PHYSICAL THERAPY SOZO SCREENING NOTE   Patient Name: Marie Day MRN: [id number] DOB:29-Apr-1946, 77 y.o., female Today's Date: 05/30/2022  PCP: Jorge Ny, PA-C REFERRING PROVIDER: Erroll Luna, MD   PT End of Session - 05/30/22 1020     Visit Number 10   # unchanged due to screen only   PT Start Time 1017    PT Stop Time 1025    PT Time Calculation (min) 8 min    Activity Tolerance Patient tolerated treatment well    Behavior During Therapy WFL for tasks assessed/performed             Past Medical History:  Diagnosis Date   Arthritis    Cancer ([place])    Past Surgical History:  Procedure Laterality Date   BREAST RECONSTRUCTION WITH PLACEMENT OF TISSUE EXPANDER AND FLEX HD (ACELLULAR HYDRATED DERMIS) Right 06/29/2021   Procedure: RIGHT BREAST RECONSTRUCTION WITH PLACEMENT OF TISSUE EXPANDER AND FLEX HD (ACELLULAR HYDRATED DERMIS);  Surgeon: Cindra Presume, MD;  Location: [place];  Service: Plastics;  Laterality: Right;   MASTECTOMY W/ SENTINEL NODE BIOPSY Right 06/29/2021   Procedure: RIGHT MASTECTOMY WITH SENTINEL LYMPH NODE BIOPSY;  Surgeon: Erroll Luna, MD;  Location: [place];  Service: General;  Laterality: Right;   [place] EXTRACTION     Patient Active Problem List   Diagnosis Date Noted   Myalgia 02/08/2022   Breast asymmetry following reconstructive surgery 12/24/2021   S/P breast reconstruction 06/29/2021   Port-A-Cath in place 01/18/2021   Malignant neoplasm of upper-outer quadrant of right breast in female, estrogen receptor negative ([place]) 12/21/2020   Anemia 10/27/2020   Prediabetes 10/27/2020   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   Unspecified age-related cataract 12/14/2015    REFERRING DIAG: right breast cancer at risk for lymphedema  THERAPY DIAG: Aftercare following surgery for neoplasm  PERTINENT HISTORY: [name] with expander on 06/29/21 with 0/4 LN positive.  Drains removed 07/15/21. Will be having radiation consult 07/27/21  and will continue HP maintenance every 3 weeks.   PRECAUTIONS: right UE Lymphedema risk, None  SUBJECTIVE: Pt returns for her 3 month L-Dex screen.  PAIN:  Are you having pain? No  SOZO SCREENING: Patient was assessed today using the SOZO machine to determine the lymphedema index score. This was compared to her baseline score. It was determined that she is within the recommended range when compared to her baseline and no further action is needed at this time. She will continue SOZO screenings. These are done every 3 months for 2 years post operatively followed by every 6 months for 2 years, and then annually.  Spent time answering pts questions within scope of practice regarding why the plastic surgeon will 6-12 months after radiation to do her implant exchange surgery due to the amount of healing that needs to occur (from radiation) before undergoing more trauma for surgery.   L-DEX FLOWSHEETS - 05/30/22 1000       L-DEX LYMPHEDEMA SCREENING   Measurement Type Unilateral    L-DEX MEASUREMENT EXTREMITY Upper Extremity    POSITION  Standing    DOMINANT SIDE Right    At Risk Side Right    BASELINE SCORE (UNILATERAL) -3.8    L-DEX SCORE (UNILATERAL) 0.3    VALUE CHANGE (UNILAT) 4.1              Otelia Limes, PTA 05/30/2022, 10:26 AM

## 2022-05-31 ENCOUNTER — Other Ambulatory Visit: Payer: Self-pay

## 2022-06-01 ENCOUNTER — Telehealth: Payer: Self-pay | Admitting: *Deleted

## 2022-06-01 NOTE — Telephone Encounter (Signed)
Auth for CPT [id number] approved from devoted No auth req [id number]  [id number] valid 06/23/22 - 08/21/22

## 2022-06-03 ENCOUNTER — Ambulatory Visit
Admission: RE | Admit: 2022-06-03 | Discharge: 2022-06-03 | Disposition: A | Discharge status: Home / Self Care | Payer: No Typology Code available for payment source | Source: Ambulatory Visit | Attending: Hematology and Oncology

## 2022-06-03 ENCOUNTER — Encounter: Payer: Self-pay | Admitting: Surgical

## 2022-06-03 ENCOUNTER — Ambulatory Visit (INDEPENDENT_AMBULATORY_CARE_PROVIDER_SITE_OTHER): Payer: No Typology Code available for payment source | Admitting: Surgical

## 2022-06-03 VITALS — BP 122/72 | HR 82

## 2022-06-03 DIAGNOSIS — M810 Age-related osteoporosis without current pathological fracture: Secondary | ICD-10-CM | POA: Diagnosis not present

## 2022-06-03 DIAGNOSIS — N651 Disproportion of reconstructed breast: Secondary | ICD-10-CM

## 2022-06-03 DIAGNOSIS — Z780 Asymptomatic menopausal state: Secondary | ICD-10-CM | POA: Diagnosis not present

## 2022-06-03 DIAGNOSIS — Z9889 Other specified postprocedural states: Secondary | ICD-10-CM

## 2022-06-03 DIAGNOSIS — Z171 Estrogen receptor negative status [ER-]: Secondary | ICD-10-CM

## 2022-06-03 DIAGNOSIS — Z1231 Encounter for screening mammogram for malignant neoplasm of breast: Secondary | ICD-10-CM | POA: Diagnosis not present

## 2022-06-03 DIAGNOSIS — Z923 Personal history of irradiation: Secondary | ICD-10-CM | POA: Diagnosis not present

## 2022-06-03 DIAGNOSIS — C50411 Malignant neoplasm of upper-outer quadrant of right female breast: Secondary | ICD-10-CM

## 2022-06-03 DIAGNOSIS — M8588 Other specified disorders of bone density and structure, other site: Secondary | ICD-10-CM | POA: Diagnosis not present

## 2022-06-03 HISTORY — DX: Malignant neoplasm of unspecified site of unspecified female breast: C50.919

## 2022-06-03 NOTE — Progress Notes (Signed)
   Referring Provider Marie Ny, PA-C [address] [place],  [place] [address]   CC:  Chief Complaint  Patient presents with   Follow-up      Marie Day is an 77 y.o. female.  HPI: Patient is a 77 year old female here for follow-up on her right breast reconstruction.  She has a subdermal expander in place at this time, currently has 320 cc out of 375 cc expander.  She had radiation which ended in May.  She presents today for concerns of tenderness of the right lateral breast and the right breast.  She reports that she has always have some level of discomfort with the expanders due to the firmness, however it seems to be slightly worse as the skin has tightened after radiation.  She has not noticed any redness, warmth or any other skin changes that concern her at this point.  She presented today to be sure that there was no abnormality noted on exam.  She reports she is scheduled for exchange of the right breast expander for an implant, mastopexy and Port-A-Cath removal with Dr. Marla Day in March.  She reports that she has completed physical therapy for tightness of the right arm and chest which had been very helpful, she feels as if her range of motion is very good.  She does feel as though she has some arthritis in her joints which cause her some discomfort.  She would not like to do any additional therapy at this time.  Review of Systems General: No fevers or chills  Physical Exam    06/03/2022   10:37 AM 05/30/2022   10:19 AM 02/23/2022   11:10 AM  Vitals with BMI  Weight  152 lbs 6 oz 193 lbs 2 oz  Systolic 790    Diastolic 72    Pulse 82      General:  No acute distress,  Alert and oriented, Non-Toxic, Normal speech and affect Right breast: Right breast incision is intact, mastectomy flaps are viable.  She does have some radiation skin changes noted and some firmness of the mastectomy flaps from the radiation.  She has some mild tenderness with palpation of the  lateral breast and axilla.  I do not appreciate any erythema, subcutaneous fluid collections or cellulitic changes on exam.  Full range of motion of right shoulder  Assessment/Plan 77 year old female currently undergoing right breast reconstruction.  She has no signs of infection on exam.  The expander is in place, the overlying radiated skin is firm.  I do not appreciate any abnormal lumps or masses on physical exam.  I discussed with the patient that discomfort with the expanders is not uncommon and sometimes it does worsen after radiation due to the increased tightness of the skin.  I offered additional physical therapy.  She does not feel as if that is necessary.  She is scheduled for preoperative appointment for her upcoming surgery in February, we will plan to check in on her pain at that point.  We also discussed that removal of some fluid from the expander is also possible.  She will call if she has any changes or concerns prior to her preop.  Marie Day Marie Day 06/03/2022, 11:26 AM

## 2022-06-07 ENCOUNTER — Telehealth: Payer: Self-pay | Admitting: *Deleted

## 2022-06-07 NOTE — Telephone Encounter (Addendum)
-----  Message from Gardenia Phlegm, NP sent at 06/07/2022  4:24 PM EST ----- Bone denisty indicates osteoporosis. Can you please call patient and let her know the results and we will discuss further in February?  Contacted patient as directed. Left information in message above on patient's named voice mail. Encouraged patient to contact office for questions or concerns.

## 2022-06-09 NOTE — H&P (View-Only) (Signed)
Patient ID: Marie Day, female    DOB: 04-Jan-1946, 77 y.o.   MRN: [id number]  Chief Complaint  Patient presents with   Pre-op Exam      ICD-10-CM   1. Breast asymmetry following reconstructive surgery  N65.1     2. S/P breast reconstruction  Z98.890     3. Malignant neoplasm of upper-outer quadrant of right breast in female, estrogen receptor negative ([place])  C50.411    Z17.1       History of Present Illness: Marie Day is a 77 y.o.  female  with a history of right breast cancer.  She presents for preoperative evaluation for upcoming procedure, removal of right breast tissue expander and placement of silicone breast implant, left breast reduction mastopexy and removal of left breast Port-A-Cath, scheduled for 06/30/2022 with Dr. Marla Roe.  The patient has not had problems with anesthesia. No history of DVT/PE.  No family history of DVT/PE.  No family or personal history of bleeding or clotting disorders.  No history of CVA/MI.   Summary of Previous Visit: History of radiation to the right breast which ended in May*  Currently has 320 cc out of 375 cc in her right breast tissue expander.  Of note, her expander is in the subdermal plane.  PMH Significant for: Right breast cancer, radiation to right breast, joint pain, positive ANA  She is currently taking: Fish oil, ASA 325 mg (for joint pain)  Past Medical History: Allergies: Allergies  Allergen Reactions   Sulfa Antibiotics Hives and Other (See Comments)    Pt reports "foggy brain, Rashy/ hives, Red spots on Chest"    Current Medications:  Current Outpatient Medications:    acetaminophen (TYLENOL 8 HOUR) 650 MG CR tablet, 2 tablets as needed Orally every 8 hrs, Disp: , Rfl:    aspirin EC 325 MG tablet, Take 325 mg by mouth daily as needed., Disp: , Rfl:    cephALEXin (KEFLEX) 500 MG capsule, Take 1 capsule (500 mg total) by mouth 4 (four) times daily for 5 days., Disp: 20 capsule, Rfl: 0    Glucosamine-Chondroitin (OSTEO BI-FLEX REGULAR STRENGTH PO), Take by mouth in the morning and at bedtime., Disp: , Rfl:    Omega 3 1000 MG CAPS, 1 capsule Orally Once a day, Disp: , Rfl:    ondansetron (ZOFRAN) 4 MG tablet, Take 1 tablet (4 mg total) by mouth every 8 (eight) hours as needed for nausea or vomiting., Disp: 20 tablet, Rfl: 0   oxyCODONE (OXY IR/ROXICODONE) 5 MG immediate release tablet, Take 1 tablet (5 mg total) by mouth every 6 (six) hours as needed for up to 5 days for severe pain., Disp: 20 tablet, Rfl: 0   Turmeric 500 MG CAPS, as directed Orally, Disp: , Rfl:   Past Medical Problems: Past Medical History:  Diagnosis Date   Arthritis    Breast cancer ([place])    Cancer ([place])     Past Surgical History: Past Surgical History:  Procedure Laterality Date   BREAST RECONSTRUCTION WITH PLACEMENT OF TISSUE EXPANDER AND FLEX HD (ACELLULAR HYDRATED DERMIS) Right 06/29/2021   Procedure: RIGHT BREAST RECONSTRUCTION WITH PLACEMENT OF TISSUE EXPANDER AND FLEX HD (ACELLULAR HYDRATED DERMIS);  Surgeon: Cindra Presume, MD;  Location: [facility];  Service: Plastics;  Laterality: Right;   MASTECTOMY Right 06/29/2021   MASTECTOMY W/ SENTINEL NODE BIOPSY Right 06/29/2021   Procedure: RIGHT MASTECTOMY WITH SENTINEL LYMPH NODE BIOPSY;  Surgeon: Erroll Luna, MD;  Location:  [facility];  Service: General;  Laterality: Right;   WISDOM TOOTH EXTRACTION      Social History: Social History   Socioeconomic History   Marital status: Married    Spouse name: Not on file   Number of children: Not on file   Years of education: Not on file   Highest education level: Not on file  Occupational History   Not on file  Tobacco Use   Smoking status: Never   Smokeless tobacco: Never  Vaping Use   Vaping Use: Not on file  Substance and Sexual Activity   Alcohol use: Never   Drug use: Never   Sexual activity: Not on file  Other Topics Concern   Not on file  Social  History Narrative   Not on file   Social Determinants of Health   Financial Resource Strain: Not on file  Food Insecurity: Not on file  Transportation Needs: Not on file  Physical Activity: Not on file  Stress: Not on file  Social Connections: Not on file  Intimate Partner Violence: Not on file    Family History: History reviewed. No pertinent family history.  Review of Systems: Review of Systems  Constitutional: Negative.   Respiratory: Negative.    Cardiovascular: Negative.   Gastrointestinal: Negative.   Musculoskeletal:  Positive for joint pain and myalgias.  Neurological: Negative.     Physical Exam: Vital Signs BP 119/74 (BP Location: Left Arm, Patient Position: Sitting, Cuff Size: Normal)   Pulse 80   SpO2 97%   Physical Exam Constitutional:      General: Not in acute distress.    Appearance: Normal appearance. Not ill-appearing.  HENT:     Head: Normocephalic and atraumatic.  Eyes:     Pupils: Pupils are equal, round Neck:     Musculoskeletal: Normal range of motion.  Cardiovascular:     Rate and Rhythm: Normal rate    Pulses: Normal pulses.  Pulmonary:     Effort: Pulmonary effort is normal. No respiratory distress.  Abdominal:     General: Abdomen is flat. There is no distension.  Musculoskeletal: Normal range of motion.  Skin:    General: Skin is warm and dry.     Findings: No erythema or rash.  Neurological:     General: No focal deficit present.     Mental Status: Alert and oriented to person, place, and time. Mental status is at baseline.     Motor: No weakness.  Psychiatric:        Mood and Affect: Mood normal.        Behavior: Behavior normal.    Assessment/Plan: The patient is scheduled for exchange of right breast tissue expander for right breast implant, left breast reduction/mastopexy for symmetry and removal of left breast Port-A-Cath with Dr. Marla Roe.  Risks, benefits, and alternatives of procedure discussed, questions answered  and consent obtained.    Smoking Status: non smoker; Counseling Given? N/a Last Mammogram: Mammogram of left breast on 06/03/2022, BI-RADS 1 negative  Caprini Score: 10, highest; Risk Factors include: Age, BMI greater than 25, Port-A-Cath in place, history of breast cancer  and length of planned surgery. Recommendation for mechanical and possible chemoprophylaxis. Encourage early ambulation.   Pictures obtained: Pictures were taken on 12/24/2021  Post-op Rx sent to pharmacy: Oxycodone, Zofran, Keflex  Patient was provided with the General Surgical Risk consent document and Pain Medication Agreement prior to their appointment.  They had adequate time to read through the risk consent documents  and Pain Medication Agreement. We also discussed them in person together during this preop appointment. All of their questions were answered to their satisfaction.  Recommended calling if they have any further questions.  Risk consent form and Pain Medication Agreement to be scanned into patient's chart.  Patient was provided with the Mentor implant patient decision checklist and this was completed during today's preoperative evaluation. Patient had time to read through the information and any questions were answered to their content. Form will be scanned into patient's chart.  The risks that can be encountered with and after placement of a breast implant were discussed and include the following but not limited to these: bleeding, infection, delayed healing, anesthesia risks, skin sensation changes, injury to structures including nerves, blood vessels, and muscles which may be temporary or permanent, allergies to tape, suture materials and glues, blood products, topical preparations or injected agents, skin contour irregularities, skin discoloration and swelling, deep vein thrombosis, cardiac and pulmonary complications, pain, which may persist, fluid accumulation, wrinkling of the skin over the implanmt, changes in  nipple or breast sensation, implant leakage or rupture, faulty position of the implant, persistent pain, formation of tight scar tissue around the implant (capsular contracture).  The risk that can be encountered with mastopexy/breast lift were discussed and include the following but not limited to these:  Breast asymmetry, fluid accumulation, firmness of the breast, inability to breast feed, loss of nipple or areola, skin loss, decrease or no nipple sensation, fat necrosis of the breast tissue, bleeding, infection, healing delay.  There are risks of anesthesia, changes to skin sensation and injury to nerves or blood vessels.  The muscle can be temporarily or permanently injured.  You may have an allergic reaction to tape, suture, glue, blood products which can result in skin discoloration, swelling, pain, skin lesions, poor healing.  Any of these can lead to the need for revisonal surgery or stage procedures.  A mastopexy has potential to interfere with diagnostic procedures.  Nipple or breast piercing can increase risks of infection.  This procedure is best done when the breast is fully developed.  Changes in the breast will continue to occur over time.  Pregnancy can alter the outcomes of previous breast surgery, weight gain and weigh loss can also effect the long term appearance.   Recommend holding ASA '325mg'$  and fish oil 1 week prior to surgery.  Electronically signed by: [name] Cincere Deprey, PA-C 06/10/2022 11:59 AM

## 2022-06-09 NOTE — Progress Notes (Signed)
Patient ID: Marie Day, female    DOB: 11/10/1945, 77 y.o.   MRN: [id number]  Chief Complaint  Patient presents with   Pre-op Exam      ICD-10-CM   1. Breast asymmetry following reconstructive surgery  N65.1     2. S/P breast reconstruction  Z98.890     3. Malignant neoplasm of upper-outer quadrant of right breast in female, estrogen receptor negative ([place])  C50.411    Z17.1       History of Present Illness: Marie Day is a 77 y.o.  female  with a history of right breast cancer.  She presents for preoperative evaluation for upcoming procedure, removal of right breast tissue expander and placement of silicone breast implant, left breast reduction mastopexy and removal of left breast Port-A-Cath, scheduled for 06/30/2022 with Dr. Marla Roe.  The patient has not had problems with anesthesia. No history of DVT/PE.  No family history of DVT/PE.  No family or personal history of bleeding or clotting disorders.  No history of CVA/MI.   Summary of Previous Visit: History of radiation to the right breast which ended in May*  Currently has 320 cc out of 375 cc in her right breast tissue expander.  Of note, her expander is in the subdermal plane.  PMH Significant for: Right breast cancer, radiation to right breast, joint pain, positive ANA  She is currently taking: Fish oil, ASA 325 mg (for joint pain)  Past Medical History: Allergies: Allergies  Allergen Reactions   Sulfa Antibiotics Hives and Other (See Comments)    Pt reports "foggy brain, Rashy/ hives, Red spots on Chest"    Current Medications:  Current Outpatient Medications:    acetaminophen (TYLENOL 8 HOUR) 650 MG CR tablet, 2 tablets as needed Orally every 8 hrs, Disp: , Rfl:    aspirin EC 325 MG tablet, Take 325 mg by mouth daily as needed., Disp: , Rfl:    cephALEXin (KEFLEX) 500 MG capsule, Take 1 capsule (500 mg total) by mouth 4 (four) times daily for 5 days., Disp: 20 capsule, Rfl: 0    Glucosamine-Chondroitin (OSTEO BI-FLEX REGULAR STRENGTH PO), Take by mouth in the morning and at bedtime., Disp: , Rfl:    Omega 3 1000 MG CAPS, 1 capsule Orally Once a day, Disp: , Rfl:    ondansetron (ZOFRAN) 4 MG tablet, Take 1 tablet (4 mg total) by mouth every 8 (eight) hours as needed for nausea or vomiting., Disp: 20 tablet, Rfl: 0   oxyCODONE (OXY IR/ROXICODONE) 5 MG immediate release tablet, Take 1 tablet (5 mg total) by mouth every 6 (six) hours as needed for up to 5 days for severe pain., Disp: 20 tablet, Rfl: 0   Turmeric 500 MG CAPS, as directed Orally, Disp: , Rfl:   Past Medical Problems: Past Medical History:  Diagnosis Date   Arthritis    Breast cancer ([place])    Cancer ([place])     Past Surgical History: Past Surgical History:  Procedure Laterality Date   BREAST RECONSTRUCTION WITH PLACEMENT OF TISSUE EXPANDER AND FLEX HD (ACELLULAR HYDRATED DERMIS) Right 06/29/2021   Procedure: RIGHT BREAST RECONSTRUCTION WITH PLACEMENT OF TISSUE EXPANDER AND FLEX HD (ACELLULAR HYDRATED DERMIS);  Surgeon: Cindra Presume, MD;  Location: [facility];  Service: Plastics;  Laterality: Right;   MASTECTOMY Right 06/29/2021   MASTECTOMY W/ SENTINEL NODE BIOPSY Right 06/29/2021   Procedure: RIGHT MASTECTOMY WITH SENTINEL LYMPH NODE BIOPSY;  Surgeon: Erroll Luna, MD;  Location:  [facility];  Service: General;  Laterality: Right;   WISDOM TOOTH EXTRACTION      Social History: Social History   Socioeconomic History   Marital status: Married    Spouse name: Not on file   Number of children: Not on file   Years of education: Not on file   Highest education level: Not on file  Occupational History   Not on file  Tobacco Use   Smoking status: Never   Smokeless tobacco: Never  Vaping Use   Vaping Use: Not on file  Substance and Sexual Activity   Alcohol use: Never   Drug use: Never   Sexual activity: Not on file  Other Topics Concern   Not on file  Social  History Narrative   Not on file   Social Determinants of Health   Financial Resource Strain: Not on file  Food Insecurity: Not on file  Transportation Needs: Not on file  Physical Activity: Not on file  Stress: Not on file  Social Connections: Not on file  Intimate Partner Violence: Not on file    Family History: History reviewed. No pertinent family history.  Review of Systems: Review of Systems  Constitutional: Negative.   Respiratory: Negative.    Cardiovascular: Negative.   Gastrointestinal: Negative.   Musculoskeletal:  Positive for joint pain and myalgias.  Neurological: Negative.     Physical Exam: Vital Signs BP 119/74 (BP Location: Left Arm, Patient Position: Sitting, Cuff Size: Normal)   Pulse 80   SpO2 97%   Physical Exam Constitutional:      General: Not in acute distress.    Appearance: Normal appearance. Not ill-appearing.  HENT:     Head: Normocephalic and atraumatic.  Eyes:     Pupils: Pupils are equal, round Neck:     Musculoskeletal: Normal range of motion.  Cardiovascular:     Rate and Rhythm: Normal rate    Pulses: Normal pulses.  Pulmonary:     Effort: Pulmonary effort is normal. No respiratory distress.  Abdominal:     General: Abdomen is flat. There is no distension.  Musculoskeletal: Normal range of motion.  Skin:    General: Skin is warm and dry.     Findings: No erythema or rash.  Neurological:     General: No focal deficit present.     Mental Status: Alert and oriented to person, place, and time. Mental status is at baseline.     Motor: No weakness.  Psychiatric:        Mood and Affect: Mood normal.        Behavior: Behavior normal.    Assessment/Plan: The patient is scheduled for exchange of right breast tissue expander for right breast implant, left breast reduction/mastopexy for symmetry and removal of left breast Port-A-Cath with Dr. Marla Roe.  Risks, benefits, and alternatives of procedure discussed, questions answered  and consent obtained.    Smoking Status: non smoker; Counseling Given? N/a Last Mammogram: Mammogram of left breast on 06/03/2022, BI-RADS 1 negative  Caprini Score: 10, highest; Risk Factors include: Age, BMI greater than 25, Port-A-Cath in place, history of breast cancer  and length of planned surgery. Recommendation for mechanical and possible chemoprophylaxis. Encourage early ambulation.   Pictures obtained: Pictures were taken on 12/24/2021  Post-op Rx sent to pharmacy: Oxycodone, Zofran, Keflex  Patient was provided with the General Surgical Risk consent document and Pain Medication Agreement prior to their appointment.  They had adequate time to read through the risk consent documents  and Pain Medication Agreement. We also discussed them in person together during this preop appointment. All of their questions were answered to their satisfaction.  Recommended calling if they have any further questions.  Risk consent form and Pain Medication Agreement to be scanned into patient's chart.  Patient was provided with the [employer]or implant patient decision checklist and this was completed during today's preoperative evaluation. Patient had time to read through the information and any questions were answered to their content. Form will be scanned into patient's chart.  The risks that can be encountered with and after placement of a breast implant were discussed and include the following but not limited to these: bleeding, infection, delayed healing, anesthesia risks, skin sensation changes, injury to structures including nerves, blood vessels, and muscles which may be temporary or permanent, allergies to tape, suture materials and glues, blood products, topical preparations or injected agents, skin contour irregularities, skin discoloration and swelling, deep vein thrombosis, cardiac and pulmonary complications, pain, which may persist, fluid accumulation, wrinkling of the skin over the implanmt, changes in  nipple or breast sensation, implant leakage or rupture, faulty position of the implant, persistent pain, formation of tight scar tissue around the implant (capsular contracture).  The risk that can be encountered with mastopexy/breast lift were discussed and include the following but not limited to these:  Breast asymmetry, fluid accumulation, firmness of the breast, inability to breast feed, loss of nipple or areola, skin loss, decrease or no nipple sensation, fat necrosis of the breast tissue, bleeding, infection, healing delay.  There are risks of anesthesia, changes to skin sensation and injury to nerves or blood vessels.  The muscle can be temporarily or permanently injured.  You may have an allergic reaction to tape, suture, glue, blood products which can result in skin discoloration, swelling, pain, skin lesions, poor healing.  Any of these can lead to the need for revisonal surgery or stage procedures.  A mastopexy has potential to interfere with diagnostic procedures.  Nipple or breast piercing can increase risks of infection.  This procedure is best done when the breast is fully developed.  Changes in the breast will continue to occur over time.  Pregnancy can alter the outcomes of previous breast surgery, weight gain and weigh loss can also effect the long term appearance.   Recommend holding ASA '325mg'$  and fish oil 1 week prior to surgery.  Electronically signed by: [name] Idalee Foxworthy, PA-C 06/10/2022 11:59 AM

## 2022-06-10 ENCOUNTER — Ambulatory Visit (INDEPENDENT_AMBULATORY_CARE_PROVIDER_SITE_OTHER): Payer: No Typology Code available for payment source | Admitting: Surgical

## 2022-06-10 ENCOUNTER — Encounter: Payer: Self-pay | Admitting: Surgical

## 2022-06-10 VITALS — BP 119/74 | HR 80 | Wt 152.6 lb

## 2022-06-10 DIAGNOSIS — Z171 Estrogen receptor negative status [ER-]: Secondary | ICD-10-CM

## 2022-06-10 DIAGNOSIS — Z9889 Other specified postprocedural states: Secondary | ICD-10-CM

## 2022-06-10 DIAGNOSIS — C50411 Malignant neoplasm of upper-outer quadrant of right female breast: Secondary | ICD-10-CM

## 2022-06-10 DIAGNOSIS — N651 Disproportion of reconstructed breast: Secondary | ICD-10-CM

## 2022-06-10 MED ORDER — CEPHALEXIN 500 MG PO CAPS: 500.0000 mg | ORAL_CAPSULE | Freq: Four times a day (QID) | ORAL | 0 refills | Status: AC

## 2022-06-10 MED ORDER — ONDANSETRON HCL 4 MG PO TABS: 4.0000 mg | ORAL_TABLET | Freq: Three times a day (TID) | ORAL | 0 refills | Status: DC | PRN

## 2022-06-10 MED ORDER — OXYCODONE HCL 5 MG PO TABS: 5.0000 mg | ORAL_TABLET | Freq: Four times a day (QID) | ORAL | 0 refills | Status: AC | PRN

## 2022-06-23 ENCOUNTER — Other Ambulatory Visit: Payer: Self-pay

## 2022-06-23 ENCOUNTER — Encounter (HOSPITAL_BASED_OUTPATIENT_CLINIC_OR_DEPARTMENT_OTHER): Payer: Self-pay | Admitting: Plastic Surgery

## 2022-06-27 ENCOUNTER — Inpatient Hospital Stay: Payer: No Typology Code available for payment source | Attending: Hematology and Oncology | Admitting: Adult Health

## 2022-06-27 ENCOUNTER — Ambulatory Visit: Payer: Medicare Other | Admitting: Hematology and Oncology

## 2022-06-27 ENCOUNTER — Other Ambulatory Visit: Payer: Self-pay

## 2022-06-27 ENCOUNTER — Encounter: Payer: Self-pay | Admitting: Adult Health

## 2022-06-27 VITALS — BP 118/56 | HR 99 | Resp 18 | Ht 65.0 in | Wt 153.0 lb

## 2022-06-27 DIAGNOSIS — Z170 Estrogen receptor positive status [ER+]: Secondary | ICD-10-CM | POA: Diagnosis not present

## 2022-06-27 DIAGNOSIS — G620 Drug-induced polyneuropathy: Secondary | ICD-10-CM | POA: Diagnosis not present

## 2022-06-27 DIAGNOSIS — Z9011 Acquired absence of right breast and nipple: Secondary | ICD-10-CM | POA: Insufficient documentation

## 2022-06-27 DIAGNOSIS — Z171 Estrogen receptor negative status [ER-]: Secondary | ICD-10-CM | POA: Diagnosis not present

## 2022-06-27 DIAGNOSIS — C50411 Malignant neoplasm of upper-outer quadrant of right female breast: Secondary | ICD-10-CM | POA: Diagnosis not present

## 2022-06-27 DIAGNOSIS — T451X5A Adverse effect of antineoplastic and immunosuppressive drugs, initial encounter: Secondary | ICD-10-CM | POA: Diagnosis not present

## 2022-06-27 DIAGNOSIS — Z923 Personal history of irradiation: Secondary | ICD-10-CM | POA: Insufficient documentation

## 2022-06-27 DIAGNOSIS — Z9221 Personal history of antineoplastic chemotherapy: Secondary | ICD-10-CM | POA: Diagnosis not present

## 2022-06-27 NOTE — Progress Notes (Signed)
SURVIVORSHIP VISIT:   BRIEF ONCOLOGIC HISTORY:  Oncology History  Malignant neoplasm of upper-outer quadrant of right breast in female, estrogen receptor negative ([place])  11/17/2020 Initial Diagnosis   Work-up performed at [facility]: Palpable right breast mass.  Mammogram and ultrasound 11/10/2020: Spiculated 4.9 cm mass UOQ right breast with microcalcifications, biopsy revealed IDC ER/PR negative, HER2 positive 3+ by IHC, axillary lymph node positive (2 lymph nodes were detected)   12/03/2020 PET scan   Right breast malignancy SUV 4.6, subcentimeter right level 1 and 2 axillary lymph nodes.  No distant metastatic disease.   12/21/2020 Cancer Staging   Staging form: Breast, AJCC 8th Edition - Clinical stage from 12/21/2020: Stage IIB (cT2, cN1, cM0, G3, ER-, PR-, HER2+) - Signed by Nicholas Lose, MD on 12/21/2020 Stage prefix: Initial diagnosis Histologic grading system: 3 grade system   12/29/2020 -  Chemotherapy   Patient is on Treatment Plan : BREAST  Docetaxel + Carboplatin + Trastuzumab + Pertuzumab  (TCHP) q21d      06/29/2021 Surgery   Right mastectomy: No residual cancer identified, 0/4 lymph nodes negative, complete pathologic response   08/27/2021 - 10/12/2021 Radiation Therapy   Site Technique Total Dose (Gy) Dose per Fx (Gy) Completed Fx Beam Energies  Chest Wall, Right: CW_R 3D 50.4/50.4 1.8 28/28 6X  Chest Wall, Right: CW_R_SCLV 3D 50.4/50.4 1.8 28/28 6X, 10X  Chest Wall, Right: CW_R_Bst Electron 10/10 2 5/5 6E       INTERVAL HISTORY:  Marie Day to review her survivorship care plan detailing her treatment course for breast cancer, as well as monitoring long-term side effects of that treatment, education regarding health maintenance, screening, and overall wellness and health promotion.     Overall, Marie Day reports feeling quite well.  She is scheduled for implant placement and port removal later this week and has not been sleeping well this week because she has been off of  all her arthritis medicine other than Tylenol.  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  She notes that it is worse in her toes and has improved a lot in her fingertips.  REVIEW OF SYSTEMS:  Review of Systems  Constitutional:  Negative for appetite change, chills, fatigue, fever and unexpected weight change.  HENT:   Negative for hearing loss, lump/mass and trouble swallowing.   Eyes:  Negative for eye problems and icterus.  Respiratory:  Negative for chest tightness, cough and shortness of breath.   Cardiovascular:  Negative for chest pain, leg swelling and palpitations.  Gastrointestinal:  Negative for abdominal distention, abdominal pain, constipation, diarrhea, nausea and vomiting.  Endocrine: Negative for hot flashes.  Genitourinary:  Negative for difficulty urinating.   Musculoskeletal:  Negative for arthralgias.  Skin:  Negative for itching and rash.  Neurological:  Negative for dizziness, extremity weakness, headaches and numbness.  Hematological:  Negative for adenopathy. Does not bruise/bleed easily.  Psychiatric/Behavioral:  Negative for depression. The patient is not nervous/anxious.      PAST MEDICAL/SURGICAL HISTORY:  Past Medical History:  Diagnosis Date   Arthritis    Breast cancer ([place])    Cancer ([place])    Past Surgical History:  Procedure Laterality Date   BREAST RECONSTRUCTION WITH PLACEMENT OF TISSUE EXPANDER AND FLEX HD (ACELLULAR HYDRATED DERMIS) Right 06/29/2021   Procedure: RIGHT BREAST RECONSTRUCTION WITH PLACEMENT OF TISSUE EXPANDER AND FLEX HD (ACELLULAR HYDRATED DERMIS);  Surgeon: Cindra Presume, MD;  Location: [place];  Service: Plastics;  Laterality: Right;   MASTECTOMY Right 06/29/2021   MASTECTOMY W/ SENTINEL NODE BIOPSY Right 06/29/2021   Procedure: RIGHT MASTECTOMY WITH SENTINEL LYMPH NODE BIOPSY;  Surgeon: Erroll Luna, MD;  Location: [place];  Service: General;  Laterality: Right;   WISDOM TOOTH EXTRACTION       ALLERGIES:  Allergies  Allergen Reactions   Sulfa Antibiotics Hives and Other (See Comments)    Pt reports "foggy brain, Rashy/ hives, Red spots on Chest"     CURRENT MEDICATIONS:  Outpatient Encounter Medications as of 06/27/2022  Medication Sig   acetaminophen (TYLENOL 8 HOUR) 650 MG CR tablet 2 tablets as needed Orally every 8 hrs   aspirin EC 325 MG tablet Take 325 mg by mouth daily as needed. (Patient not taking: Reported on 06/27/2022)   Glucosamine-Chondroitin (OSTEO BI-FLEX REGULAR STRENGTH PO) Take by mouth in the morning and at bedtime. (Patient not taking: Reported on 06/27/2022)   Omega 3 1000 MG CAPS 1 capsule Orally Once a day (Patient not taking: Reported on 06/27/2022)   ondansetron (ZOFRAN) 4 MG tablet Take 1 tablet (4 mg total) by mouth every 8 (eight) hours as needed for nausea or vomiting. (Patient not taking: Reported on 06/27/2022)   Turmeric 500 MG CAPS as directed Orally (Patient not taking: Reported on 06/27/2022)   No facility-administered encounter medications on file as of 06/27/2022.     ONCOLOGIC FAMILY HISTORY:  Non-contributory  SOCIAL HISTORY:  Social History   Socioeconomic History   Marital status: Married    Spouse name: Not on file   Number of children: Not on file   Years of education: Not on file   Highest education level: Not on file  Occupational History   Not on file  Tobacco Use   Smoking status: Never   Smokeless tobacco: Never  Vaping Use   Vaping Use: Not on file  Substance and Sexual Activity   Alcohol use: Never   Drug use: Never   Sexual activity: Not on file  Other Topics Concern   Not on file  Social History Narrative   Not on file   Social Determinants of Health   Financial Resource Strain: Not on file  Food Insecurity: Not on file  Transportation Needs: Not on file  Physical Activity: Not on file  Stress: Not on file  Social Connections:  Not on file  Intimate Partner Violence: Not on file     OBSERVATIONS/OBJECTIVE:  BP (!) 118/56 (BP Location: Left Arm, Patient Position: Sitting)   Pulse 99   Resp 18   Ht '5\' 5"'$  (1.651 m)   Wt 153 lb (69.4 kg)   SpO2 98%   BMI 25.46 kg/m  GENERAL: Patient is a well appearing female in no acute distress HEENT:  Sclerae anicteric.  Oropharynx clear and moist. No ulcerations or evidence of oropharyngeal candidiasis. Neck is supple.  NODES:  No cervical, supraclavicular, or axillary lymphadenopathy palpated.  BREAST EXAM: Right breast status postmastectomy and radiation with reconstruction no sign of local recurrence, left breast is benign LUNGS:  Clear to auscultation bilaterally.  No wheezes or rhonchi. HEART:  Regular rate and rhythm. No murmur appreciated. ABDOMEN:  Soft, nontender.  Positive, normoactive bowel sounds. No organomegaly palpated. MSK:  No focal spinal tenderness to palpation. Full range of motion bilaterally in the upper extremities. EXTREMITIES:  No peripheral edema.   SKIN:  Clear with no obvious rashes or skin changes. No nail dyscrasia. NEURO:  Nonfocal. Well oriented.  Appropriate affect.  LABORATORY DATA:  None for this visit.  DIAGNOSTIC IMAGING:  None for this visit.      ASSESSMENT AND PLAN:  Marie Day is a pleasant 77 y.o. female with Stage IIB right breast invasive ductal carcinoma, ER-/PR-/HER2+, diagnosed in 10/2020, treated with neoadjuvant chemotherapy, mastectomy, adjuvant radiation therapy..  She presents to the Survivorship Clinic for our initial meeting and routine follow-up post-completion of treatment for breast cancer.    1. Stage 2B right breast cancer:  Marie Day is continuing to recover from definitive treatment for breast cancer. She will follow-up with her medical oncologist, Dr. Lindi Adie in 6 months with history and physical exam per surveillance protocol.  She is undergoing port removal later this week.  Her most recent mammogram was  in January 2024 and was negative for malignancy she will repeat mammography in January 2025.  Today, a comprehensive survivorship care plan and treatment summary was reviewed with the patient today detailing her breast cancer diagnosis, treatment course, potential late/long-term effects of treatment, appropriate follow-up care with recommendations for the future, and patient education resources.  A copy of this summary, along with a letter will be sent to the patient's primary care provider via mail/fax/In Basket message after today's visit.    2. Bone health: She underwent bone density testing in January 2024 that demonstrated osteoporosis with a T-score -2.9 in the left femur.  We discussed bone health including calcium, vitamin D, and weightbearing exercises.  We also briefly discussed bisphosphonate therapy.  She is recommended to repeat bone density testing in January 2026.  3. Cancer screening:  Due to Marie Day history and her age, she should receive screening for skin cancers, colon cancer, and gynecologic cancers.  The information and recommendations are listed on the patient's comprehensive care plan/treatment summary and were reviewed in detail with the patient.    4. Health maintenance and wellness promotion: Marie Day was encouraged to consume 5-7 servings of fruits and vegetables per day. We reviewed the "Nutrition Rainbow" handout.  She was also encouraged to engage in moderate to vigorous exercise for 30 minutes per day most days of the week. She was instructed to limit her alcohol consumption and continue to abstain from tobacco use.     5. Support services/counseling: It is not uncommon for this period of the patient's cancer care trajectory to be one of many emotions and stressors.  She was given information regarding our available services and encouraged to contact me with any questions or for help enrolling in any of our support group/programs.   6.  Chemotherapy-induced peripheral  neuropathy: This is improving with time.  We will continue to monitor this and I am hopeful that will continue to improve.  It is not causing significant discomfort or pain.  She is going to continue trying to apply heat to see if it will help her toes as it did with her fingers.   Follow up instructions:    -Return to cancer center in 6 months for f/u with Dr. Lindi Adie  -Mammogram due in January 2025 -Bone density testing due in January 2026 -She is welcome to return back to the Survivorship Clinic at any time; no additional follow-up needed at this time.  -Consider referral back to survivorship as a long-term survivor for continued surveillance  The patient was provided an opportunity to ask questions and all were answered. The patient agreed with the plan and demonstrated an understanding of the instructions.   Total encounter time:40 minutes*in face-to-face visit time, chart  review, lab review, care coordination, order entry, and documentation of the encounter time.    Wilber Bihari, NP 06/27/22 12:15 PM Medical Oncology and Hematology [facility] [place], [place] [address] Tel. [phone]    Fax. [phone]  *Total Encounter Time as defined by the Centers for Medicare and Medicaid Services includes, in addition to the face-to-face time of a patient visit (documented in the note above) non-face-to-face time: obtaining and reviewing outside history, ordering and reviewing medications, tests or procedures, care coordination (communications with other health care professionals or caregivers) and documentation in the medical record.

## 2022-06-30 ENCOUNTER — Ambulatory Visit (HOSPITAL_BASED_OUTPATIENT_CLINIC_OR_DEPARTMENT_OTHER): Payer: No Typology Code available for payment source | Admitting: Anesthesiology

## 2022-06-30 ENCOUNTER — Other Ambulatory Visit: Payer: Self-pay

## 2022-06-30 ENCOUNTER — Encounter (HOSPITAL_BASED_OUTPATIENT_CLINIC_OR_DEPARTMENT_OTHER): Payer: Self-pay | Admitting: Plastic Surgery

## 2022-06-30 ENCOUNTER — Encounter (HOSPITAL_BASED_OUTPATIENT_CLINIC_OR_DEPARTMENT_OTHER)
Admission: RE | Disposition: A | Discharge status: Home / Self Care | Payer: Self-pay | Source: Home / Self Care | Attending: Plastic Surgery

## 2022-06-30 ENCOUNTER — Ambulatory Visit (HOSPITAL_BASED_OUTPATIENT_CLINIC_OR_DEPARTMENT_OTHER)
Admission: RE | Admit: 2022-06-30 | Discharge: 2022-06-30 | Disposition: A | Discharge status: Home / Self Care | Payer: No Typology Code available for payment source | Attending: Plastic Surgery | Admitting: Plastic Surgery

## 2022-06-30 DIAGNOSIS — Z853 Personal history of malignant neoplasm of breast: Secondary | ICD-10-CM

## 2022-06-30 DIAGNOSIS — N650 Deformity of reconstructed breast: Secondary | ICD-10-CM

## 2022-06-30 DIAGNOSIS — Z7982 Long term (current) use of aspirin: Secondary | ICD-10-CM | POA: Diagnosis not present

## 2022-06-30 DIAGNOSIS — Z9011 Acquired absence of right breast and nipple: Secondary | ICD-10-CM | POA: Diagnosis not present

## 2022-06-30 DIAGNOSIS — Z171 Estrogen receptor negative status [ER-]: Secondary | ICD-10-CM | POA: Insufficient documentation

## 2022-06-30 DIAGNOSIS — Z9889 Other specified postprocedural states: Secondary | ICD-10-CM | POA: Insufficient documentation

## 2022-06-30 DIAGNOSIS — Z452 Encounter for adjustment and management of vascular access device: Secondary | ICD-10-CM | POA: Diagnosis not present

## 2022-06-30 DIAGNOSIS — Z01818 Encounter for other preprocedural examination: Secondary | ICD-10-CM

## 2022-06-30 DIAGNOSIS — C50411 Malignant neoplasm of upper-outer quadrant of right female breast: Secondary | ICD-10-CM | POA: Insufficient documentation

## 2022-06-30 DIAGNOSIS — Z923 Personal history of irradiation: Secondary | ICD-10-CM | POA: Insufficient documentation

## 2022-06-30 DIAGNOSIS — M2550 Pain in unspecified joint: Secondary | ICD-10-CM | POA: Diagnosis not present

## 2022-06-30 DIAGNOSIS — N651 Disproportion of reconstructed breast: Secondary | ICD-10-CM | POA: Insufficient documentation

## 2022-06-30 HISTORY — PX: REMOVAL OF TISSUE EXPANDER AND PLACEMENT OF IMPLANT: SHX6457

## 2022-06-30 HISTORY — PX: PORT-A-CATH REMOVAL: SHX5289

## 2022-06-30 HISTORY — PX: BREAST REDUCTION WITH MASTOPEXY: SHX6465

## 2022-06-30 SURGERY — REMOVAL, TISSUE EXPANDER, BREAST, WITH IMPLANT INSERTION
Anesthesia: General | Site: Chest | Laterality: Right

## 2022-06-30 MED ORDER — ACETAMINOPHEN 500 MG PO TABS
ORAL_TABLET | ORAL | Status: AC
  Filled 2022-06-30: qty 2

## 2022-06-30 MED ORDER — PROPOFOL 10 MG/ML IV BOLUS
INTRAVENOUS | Status: DC | PRN
  Administered 2022-06-30: 120 mg via INTRAVENOUS

## 2022-06-30 MED ORDER — SODIUM CHLORIDE 0.9 % IV SOLN
INTRAVENOUS | Status: AC
  Filled 2022-06-30: qty 10

## 2022-06-30 MED ORDER — ONDANSETRON HCL 4 MG/2ML IJ SOLN
INTRAMUSCULAR | Status: DC | PRN
  Administered 2022-06-30: 4 mg via INTRAVENOUS

## 2022-06-30 MED ORDER — FENTANYL CITRATE (PF) 100 MCG/2ML IJ SOLN
INTRAMUSCULAR | Status: DC | PRN
  Administered 2022-06-30: 50 ug via INTRAVENOUS

## 2022-06-30 MED ORDER — ACETAMINOPHEN 500 MG PO TABS
1000.0000 mg | ORAL_TABLET | Freq: Once | ORAL | Status: AC
  Administered 2022-06-30: 1000 mg via ORAL

## 2022-06-30 MED ORDER — FENTANYL CITRATE (PF) 100 MCG/2ML IJ SOLN: 25.0000 ug | INTRAMUSCULAR | Status: DC | PRN

## 2022-06-30 MED ORDER — MIDAZOLAM HCL 2 MG/2ML IJ SOLN
INTRAMUSCULAR | Status: AC
  Filled 2022-06-30: qty 2

## 2022-06-30 MED ORDER — DEXAMETHASONE SODIUM PHOSPHATE 4 MG/ML IJ SOLN
INTRAMUSCULAR | Status: DC | PRN
  Administered 2022-06-30: 4 mg via INTRAVENOUS

## 2022-06-30 MED ORDER — HYDROMORPHONE HCL 1 MG/ML IJ SOLN
INTRAMUSCULAR | Status: DC | PRN
  Administered 2022-06-30: .5 mg via INTRAVENOUS

## 2022-06-30 MED ORDER — LIDOCAINE-EPINEPHRINE 1 %-1:100000 IJ SOLN
INTRAMUSCULAR | Status: DC | PRN
  Administered 2022-06-30: 20 mL

## 2022-06-30 MED ORDER — SODIUM CHLORIDE 0.9 % IV SOLN
INTRAVENOUS | Status: DC | PRN
  Administered 2022-06-30: 500 mL

## 2022-06-30 MED ORDER — EPHEDRINE SULFATE (PRESSORS) 50 MG/ML IJ SOLN
INTRAMUSCULAR | Status: DC | PRN
  Administered 2022-06-30: 10 mg via INTRAVENOUS

## 2022-06-30 MED ORDER — ATROPINE SULFATE 0.4 MG/ML IV SOLN
INTRAVENOUS | Status: AC
  Filled 2022-06-30: qty 1

## 2022-06-30 MED ORDER — PHENYLEPHRINE 80 MCG/ML (10ML) SYRINGE FOR IV PUSH (FOR BLOOD PRESSURE SUPPORT)
PREFILLED_SYRINGE | INTRAVENOUS | Status: AC
  Filled 2022-06-30: qty 10

## 2022-06-30 MED ORDER — CEFAZOLIN SODIUM-DEXTROSE 2-4 GM/100ML-% IV SOLN
2.0000 g | INTRAVENOUS | Status: AC
  Administered 2022-06-30: 2 g via INTRAVENOUS

## 2022-06-30 MED ORDER — LIDOCAINE 2% (20 MG/ML) 5 ML SYRINGE
INTRAMUSCULAR | Status: AC
  Filled 2022-06-30: qty 5

## 2022-06-30 MED ORDER — EPHEDRINE 5 MG/ML INJ
INTRAVENOUS | Status: AC
  Filled 2022-06-30: qty 5

## 2022-06-30 MED ORDER — SUCCINYLCHOLINE CHLORIDE 200 MG/10ML IV SOSY
PREFILLED_SYRINGE | INTRAVENOUS | Status: AC
  Filled 2022-06-30: qty 10

## 2022-06-30 MED ORDER — MIDAZOLAM HCL 5 MG/5ML IJ SOLN
INTRAMUSCULAR | Status: DC | PRN
  Administered 2022-06-30: 1 mg via INTRAVENOUS

## 2022-06-30 MED ORDER — CHLORHEXIDINE GLUCONATE CLOTH 2 % EX PADS: 6.0000 | MEDICATED_PAD | Freq: Once | CUTANEOUS | Status: DC

## 2022-06-30 MED ORDER — CEFAZOLIN SODIUM-DEXTROSE 2-4 GM/100ML-% IV SOLN
INTRAVENOUS | Status: AC
  Filled 2022-06-30: qty 100

## 2022-06-30 MED ORDER — DEXAMETHASONE SODIUM PHOSPHATE 10 MG/ML IJ SOLN
INTRAMUSCULAR | Status: AC
  Filled 2022-06-30: qty 1

## 2022-06-30 MED ORDER — LIDOCAINE HCL (CARDIAC) PF 100 MG/5ML IV SOSY
PREFILLED_SYRINGE | INTRAVENOUS | Status: DC | PRN
  Administered 2022-06-30: 40 mg via INTRAVENOUS

## 2022-06-30 MED ORDER — FENTANYL CITRATE (PF) 100 MCG/2ML IJ SOLN
INTRAMUSCULAR | Status: AC
  Filled 2022-06-30: qty 2

## 2022-06-30 MED ORDER — LACTATED RINGERS IV SOLN: INTRAVENOUS | Status: DC

## 2022-06-30 MED ORDER — HYDROMORPHONE HCL 1 MG/ML IJ SOLN
INTRAMUSCULAR | Status: AC
  Filled 2022-06-30: qty 1

## 2022-06-30 MED ORDER — PHENYLEPHRINE 80 MCG/ML (10ML) SYRINGE FOR IV PUSH (FOR BLOOD PRESSURE SUPPORT)
PREFILLED_SYRINGE | INTRAVENOUS | Status: DC | PRN
  Administered 2022-06-30: 80 ug via INTRAVENOUS

## 2022-06-30 MED ORDER — ONDANSETRON HCL 4 MG/2ML IJ SOLN
INTRAMUSCULAR | Status: AC
  Filled 2022-06-30: qty 2

## 2022-06-30 SURGICAL SUPPLY — 89 items
ADH SKN CLS APL DERMABOND .7 (GAUZE/BANDAGES/DRESSINGS) ×6
BAG DECANTER FOR FLEXI CONT (MISCELLANEOUS) ×3 IMPLANT
BINDER BREAST LRG (GAUZE/BANDAGES/DRESSINGS) IMPLANT
BINDER BREAST MEDIUM (GAUZE/BANDAGES/DRESSINGS) IMPLANT
BINDER BREAST XLRG (GAUZE/BANDAGES/DRESSINGS) IMPLANT
BINDER BREAST XXLRG (GAUZE/BANDAGES/DRESSINGS) IMPLANT
BIOPATCH RED 1 DISK 7.0 (GAUZE/BANDAGES/DRESSINGS) IMPLANT
BLADE CLIPPER SURG (BLADE) IMPLANT
BLADE HEX COATED 2.75 (ELECTRODE) ×3 IMPLANT
BLADE SURG 10 STRL SS (BLADE) ×3 IMPLANT
BLADE SURG 15 STRL LF DISP TIS (BLADE) ×3 IMPLANT
BLADE SURG 15 STRL SS (BLADE) ×3
BNDG GAUZE DERMACEA FLUFF 4 (GAUZE/BANDAGES/DRESSINGS) IMPLANT
BNDG GZE DERMACEA 4 6PLY (GAUZE/BANDAGES/DRESSINGS)
CANISTER SUCT 1200ML W/VALVE (MISCELLANEOUS) ×3 IMPLANT
COVER BACK TABLE 60X90IN (DRAPES) ×3 IMPLANT
COVER MAYO STAND STRL (DRAPES) ×3 IMPLANT
DERMABOND ADVANCED .7 DNX12 (GAUZE/BANDAGES/DRESSINGS) IMPLANT
DRAIN CHANNEL 19F RND (DRAIN) IMPLANT
DRAPE LAPAROSCOPIC ABDOMINAL (DRAPES) ×3 IMPLANT
DRAPE LAPAROTOMY 100X72 PEDS (DRAPES) ×3 IMPLANT
DRESSING MEPILEX FLEX 4X4 (GAUZE/BANDAGES/DRESSINGS) IMPLANT
DRSG MEPILEX FLEX 4X4 (GAUZE/BANDAGES/DRESSINGS) ×3
DRSG MEPILEX POST OP 4X8 (GAUZE/BANDAGES/DRESSINGS) ×6 IMPLANT
DRSG TEGADERM 2-3/8X2-3/4 SM (GAUZE/BANDAGES/DRESSINGS) IMPLANT
ELECT BLADE 4.0 EZ CLEAN MEGAD (MISCELLANEOUS) ×3
ELECT COATED BLADE 2.86 ST (ELECTRODE) ×3 IMPLANT
ELECT REM PT RETURN 9FT ADLT (ELECTROSURGICAL) ×3
ELECTRODE BLDE 4.0 EZ CLN MEGD (MISCELLANEOUS) ×3 IMPLANT
ELECTRODE REM PT RTRN 9FT ADLT (ELECTROSURGICAL) ×3 IMPLANT
EVACUATOR SILICONE 100CC (DRAIN) IMPLANT
FUNNEL KELLER 2 DISP (MISCELLANEOUS) IMPLANT
GAUZE PAD ABD 8X10 STRL (GAUZE/BANDAGES/DRESSINGS) ×6 IMPLANT
GAUZE SPONGE 4X4 12PLY STRL (GAUZE/BANDAGES/DRESSINGS) IMPLANT
GAUZE SPONGE 4X4 12PLY STRL LF (GAUZE/BANDAGES/DRESSINGS) IMPLANT
GLOVE BIO SURGEON STRL SZ 6.5 (GLOVE) ×6 IMPLANT
GLOVE BIO SURGEON STRL SZ7.5 (GLOVE) ×3 IMPLANT
GLOVE BIOGEL PI IND STRL 7.0 (GLOVE) IMPLANT
GLOVE BIOGEL PI IND STRL 7.5 (GLOVE) IMPLANT
GLOVE SURG SYN 7.5  E (GLOVE) ×3
GLOVE SURG SYN 7.5 E (GLOVE) ×3 IMPLANT
GLOVE SURG SYN 7.5 PF PI (GLOVE) IMPLANT
GOWN STRL REUS W/ TWL LRG LVL3 (GOWN DISPOSABLE) ×9 IMPLANT
GOWN STRL REUS W/ TWL XL LVL3 (GOWN DISPOSABLE) IMPLANT
GOWN STRL REUS W/TWL LRG LVL3 (GOWN DISPOSABLE) ×9
GOWN STRL REUS W/TWL XL LVL3 (GOWN DISPOSABLE) ×3
IMPL BREAST HP GEL 400CC (Breast) IMPLANT
IMPLANT BREAST HP GEL 400CC (Breast) ×3 IMPLANT
IV NS 1000ML (IV SOLUTION)
IV NS 1000ML BAXH (IV SOLUTION) IMPLANT
NDL HYPO 25X1 1.5 SAFETY (NEEDLE) ×3 IMPLANT
NDL SAFETY ECLIP 18X1.5 (MISCELLANEOUS) ×3 IMPLANT
NEEDLE HYPO 25X1 1.5 SAFETY (NEEDLE) ×3 IMPLANT
NS IRRIG 1000ML POUR BTL (IV SOLUTION) IMPLANT
PACK BASIN DAY SURGERY FS (CUSTOM PROCEDURE TRAY) ×3 IMPLANT
PENCIL SMOKE EVACUATOR (MISCELLANEOUS) ×3 IMPLANT
PIN SAFETY STERILE (MISCELLANEOUS) IMPLANT
SIZER BREAST REUSE 375CC (SIZER) ×3
SIZER BREAST REUSE 400CC (SIZER) ×3
SIZER BRST REUSE 12.2 400CC (SIZER) IMPLANT
SIZER BRST REUSE P4.8 12 375CC (SIZER) IMPLANT
SLEEVE SCD COMPRESS KNEE MED (STOCKING) ×3 IMPLANT
SPIKE FLUID TRANSFER (MISCELLANEOUS) IMPLANT
SPONGE GAUZE 2X2 8PLY STRL LF (GAUZE/BANDAGES/DRESSINGS) IMPLANT
SPONGE T-LAP 18X18 ~~LOC~~+RFID (SPONGE) ×6 IMPLANT
SPONGE T-LAP 4X18 ~~LOC~~+RFID (SPONGE) IMPLANT
STRIP CLOSURE SKIN 1/2X4 (GAUZE/BANDAGES/DRESSINGS) ×3 IMPLANT
STRIP SUTURE WOUND CLOSURE 1/2 (MISCELLANEOUS) ×3 IMPLANT
SUT MNCRL AB 3-0 PS2 18 (SUTURE) IMPLANT
SUT MNCRL AB 4-0 PS2 18 (SUTURE) ×3 IMPLANT
SUT MON AB 3-0 SH 27 (SUTURE) ×3
SUT MON AB 3-0 SH27 (SUTURE) ×3 IMPLANT
SUT MON AB 4-0 PS1 27 (SUTURE) ×3 IMPLANT
SUT MON AB 5-0 PS2 18 (SUTURE) ×3 IMPLANT
SUT PDS 3-0 CT2 (SUTURE) ×6
SUT PDS AB 2-0 CT2 27 (SUTURE) IMPLANT
SUT PDS II 3-0 CT2 27 ABS (SUTURE) IMPLANT
SUT SILK 3 0 PS 1 (SUTURE) IMPLANT
SUT VIC AB 3-0 SH 27 (SUTURE) ×3
SUT VIC AB 3-0 SH 27X BRD (SUTURE) ×3 IMPLANT
SUT VICRYL 4-0 PS2 18IN ABS (SUTURE) IMPLANT
SYR BULB IRRIG 60ML STRL (SYRINGE) ×3 IMPLANT
SYR CONTROL 10ML LL (SYRINGE) ×3 IMPLANT
TAPE MEASURE VINYL STERILE (MISCELLANEOUS) ×3 IMPLANT
TOWEL GREEN STERILE FF (TOWEL DISPOSABLE) ×6 IMPLANT
TRAY DSU PREP LF (CUSTOM PROCEDURE TRAY) ×3 IMPLANT
TUBE CONNECTING 20X1/4 (TUBING) ×3 IMPLANT
UNDERPAD 30X36 HEAVY ABSORB (UNDERPADS AND DIAPERS) ×6 IMPLANT
YANKAUER SUCT BULB TIP NO VENT (SUCTIONS) ×3 IMPLANT

## 2022-06-30 NOTE — Transfer of Care (Signed)
Immediate Anesthesia Transfer of Care Note  Patient: Marie Day  Procedure(s) Performed: REMOVAL OF TISSUE EXPANDER AND PLACEMENT OF IMPLANT (Right: Breast) LEFT BREAST REDUCTION WITH MASTOPEXY (Left: Breast) REMOVAL PORT-A-CATH (Left: Chest)  Patient Location: PACU  Anesthesia Type:General  Level of Consciousness: sedated  Airway & Oxygen Therapy: Patient Spontanous Breathing and Patient connected to face mask oxygen  Post-op Assessment: Report given to RN and Post -op Vital signs reviewed and stable  Post vital signs: Reviewed and stable  Last Vitals:  Vitals Value Taken Time  BP    Temp    Pulse 82 06/30/22 1158  Resp    SpO2 91 % 06/30/22 1158  Vitals shown include unvalidated device data.  Last Pain:  Vitals:   06/30/22 0924  TempSrc: Oral  PainSc: 4          Complications: No notable events documented.

## 2022-06-30 NOTE — Interval H&P Note (Signed)
History and Physical Interval Note:  06/30/2022 9:49 AM  Marie Day  has presented today for surgery, with the diagnosis of Breast asymmetry following reconstructive surgery.  The various methods of treatment have been discussed with the patient and family. After consideration of risks, benefits and other options for treatment, the patient has consented to  Procedure(s): REMOVAL OF TISSUE EXPANDER AND PLACEMENT OF IMPLANT (Right) BREAST REDUCTION WITH MASTOPEXY (Left) REMOVAL PORT-A-CATH (Left) as a surgical intervention.  The patient's history has been reviewed, patient examined, no change in status, stable for surgery.  I have reviewed the patient's chart and labs.  Questions were answered to the patient's satisfaction.     Loel Lofty Apryll Hinkle

## 2022-06-30 NOTE — Anesthesia Preprocedure Evaluation (Addendum)
Anesthesia Evaluation  Patient identified by MRN, date of birth, ID band Patient awake    Reviewed: Allergy & Precautions, NPO status , Patient's Chart, lab work & pertinent test results  Airway Mallampati: II  TM Distance: >3 FB Neck ROM: Full    Dental no notable dental hx. (+) Teeth Intact, Dental Advisory Given   Pulmonary neg pulmonary ROS   Pulmonary exam normal breath sounds clear to auscultation       Cardiovascular negative cardio ROS Normal cardiovascular exam Rhythm:Regular Rate:Normal  TTE 2023  1. Left ventricular ejection fraction, by estimation, is 55 to 60%. The  left ventricle has normal function. The left ventricle has no regional  wall motion abnormalities. Left ventricular diastolic parameters are  consistent with Grade I diastolic  dysfunction (impaired relaxation). The average left ventricular global  longitudinal strain is -18.8 %. The global longitudinal strain is normal.   2. Right ventricular systolic function is normal. The right ventricular  size is normal. There is normal pulmonary artery systolic pressure. The  estimated right ventricular systolic pressure is 78.4 mmHg.   3. The mitral valve is normal in structure. No evidence of mitral valve  regurgitation. No evidence of mitral stenosis.   4. The aortic valve is tricuspid. Aortic valve regurgitation is not  visualized. Aortic valve sclerosis is present, with no evidence of aortic  valve stenosis. Aortic valve Vmax measures 1.36 m/s.   5. The inferior vena cava is normal in size with greater than 50%  respiratory variability, suggesting right atrial pressure of 3 mmHg.   6. Compared to prior echo dated 04/30/2021, LV strain has increased from  -21.3% to -18.8% but still in normal range.     Neuro/Psych negative neurological ROS  negative psych ROS   GI/Hepatic negative GI ROS, Neg liver ROS,,,  Endo/Other  negative endocrine ROS     Renal/GU negative Renal ROS  negative genitourinary   Musculoskeletal  (+) Arthritis ,    Abdominal   Peds  Hematology  (+) Blood dyscrasia, anemia   Anesthesia Other Findings Right breast CA  Reproductive/Obstetrics                             Anesthesia Physical Anesthesia Plan  ASA: 2  Anesthesia Plan: General   Post-op Pain Management: Tylenol PO (pre-op)* and Dilaudid IV   Induction: Intravenous  PONV Risk Score and Plan: 3 and Dexamethasone, Ondansetron and Treatment may vary due to age or medical condition  Airway Management Planned: Oral ETT  Additional Equipment:   Intra-op Plan:   Post-operative Plan: Extubation in OR  Informed Consent: I have reviewed the patients History and Physical, chart, labs and discussed the procedure including the risks, benefits and alternatives for the proposed anesthesia with the patient or authorized representative who has indicated his/her understanding and acceptance.     Dental advisory given  Plan Discussed with: CRNA  Anesthesia Plan Comments:        Anesthesia Quick Evaluation

## 2022-06-30 NOTE — Anesthesia Procedure Notes (Signed)
Procedure Name: LMA Insertion Date/Time: 06/30/2022 10:41 AM  Performed by: Willa Frater, CRNAPre-anesthesia Checklist: Patient identified, Emergency Drugs available, Suction available and Patient being monitored Patient Re-evaluated:Patient Re-evaluated prior to induction Oxygen Delivery Method: Circle system utilized Preoxygenation: Pre-oxygenation with 100% oxygen Induction Type: IV induction Ventilation: Mask ventilation without difficulty LMA: LMA inserted LMA Size: 4.0 Number of attempts: 1 Airway Equipment and Method: Bite block Placement Confirmation: positive ETCO2 Tube secured with: Tape Dental Injury: Teeth and Oropharynx as per pre-operative assessment

## 2022-06-30 NOTE — Op Note (Signed)
Op report Unilateral Breast Exchange   DATE OF OPERATION:  06/30/2022  LOCATION: [place]  SURGICAL DIVISION: Plastic Surgery  PREOPERATIVE DIAGNOSES:  1. History of right upper outer receptor negative breast cancer.  2. Acquired absence of right breast.  3. Left breast asymmetry after reconstruction. 4. Port-a-cath in place.  POSTOPERATIVE DIAGNOSES:  Same as preoperative diagnosis  PROCEDURE:  1. Exchange of tissue expander for implant. 2. Capsulotomies for implant respositioning. 3. Left breast mastopexy for symmetry. 4. Removal of port-a-cath.  SURGEON: Quinnley Colasurdo Sanger Tsutomu Barfoot, DO  ASSISTANT: Lenn Sink, PA  ANESTHESIA:  General.   COMPLICATIONS: None.   IMPLANTS: [place] Smooth Round  High Profile Gel 400 cc. Ref #[id number].  Serial Number [id number]  INDICATIONS FOR PROCEDURE:  The patient, Marie Day, is a 77 y.o. female born on 1945-09-29, is here for further treatment after a mastectomy and placement of a tissue expander. She now presents for exchange of her expander for an implant.  She requires capsulotomies to better position the implant. She also requested the port-a-cath to be removed which was cleared with Dr. Lindi Adie.  She would like a left sided mastopexy for symmetry.  MRN: [id number]  CONSENT:  Informed consent was obtained directly from the patient. Risks, benefits and alternatives were fully discussed. Specific risks including but not limited to bleeding, infection, hematoma, seroma, scarring, pain, implant infection, implant extrusion, capsular contracture, asymmetry, wound healing problems, and need for further surgery were all discussed. The patient did have an ample opportunity to have her questions answered to her satisfaction.   DESCRIPTION OF PROCEDURE:  The patient was taken to the operating room. SCDs were placed and IV antibiotics were given. The patient's chest was prepped and draped in a sterile fashion. A time out  was performed and the implants to be used were identified.  Local with epinephrine was used to infiltrate the areas marked on each breast and the port-a-cath site.   Right:  The right breast had radiation so the inframammary fold was utilized.  The incision was made at the fold.  The Bovie was used to dissect to the ADM.  The ADM was split to expose the tissue expander which was removed. Inspection of the pocket showed a normal healthy capsule and good integration of the biologic matrix. All areas but the fold were released with capsulotomies using the Bovie to allow for breast pocket expansion.  Measurements were made to confirm adequate pocket size for the implant dimensions.  Hemostasis was ensured with electrocautery.  The pocket was irrigated with antibiotic solution.  New gloves were placed.  The implant was placed in the pocket and oriented appropriately using the keller funnel.. The capsule and ADM were closed with the 3-0 PDS suture. The remaining skin was closed with 3-0 PDS deep dermal and 4-0 Monocryl subcuticular stitches.  Dermabond was applied.   Left side: Preoperative markings were confirmed. After waiting for vasoconstriction, the marked lines were incised with a #15 blade.  A Lollipop type incision was performed with de-epithelializing the vertical limb and around the areola to be removed. Hemostasis was achieved.  The nipple was gently raised into position and the soft tissue was closed with 3-0 Monocryl followed by the 4-0 Monocryl.  The deep tissues were approximated with 3-0 PDS sutures. The skin was closed with deep dermal 3-0 Monocryl and subcuticular 4-0 Monocryl sutures.  Dermabond was applied.  A breast binder and ABDs were placed.  The nipple and skin flaps had good capillary  refill at the end of the procedure.   Port-a-cath:  The previous incision was used to open the site of the catheter and port.  The port was removed and the patient was placed in the Trendelenburg position.   The catheter was removed and intact.  The 3-0 Vicryl was used to cinch around the site.  The deep layers were closed with the 4-0 Monocryl followed by a subcuticular 4-0 Monocryl closure. The patient tolerated the procedure well. The patient was allowed to wake from anesthesia and taken to the recovery room in satisfactory condition.  The advanced practice practitioner (APP) assisted throughout the case.  The APP was essential in retraction and counter traction when needed to make the case progress smoothly.  This retraction and assistance made it possible to see the tissue plans for the procedure.  The assistance was needed for blood control, tissue re-approximation and assisted with closure of the incision site.

## 2022-06-30 NOTE — Discharge Instructions (Addendum)
INSTRUCTIONS FOR AFTER BREAST SURGERY   You will likely have some questions about what to expect following your operation.  The following information will help you and your family understand what to expect when you are discharged from the hospital.  It is important to follow these guidelines to help ensure a smooth recovery and reduce complication.  Postoperative instructions include information on: diet, wound care, medications and physical activity.  AFTER SURGERY Expect to go home after the procedure.  In some cases, you may need to spend one night in the hospital for observation.  DIET Breast surgery does not require a specific diet.  However, the healthier you eat the better your body will heal. It is important to increasing your protein intake.  This means limiting the foods with sugar and carbohydrates.  Focus on vegetables and some meat.  If you have liposuction during your procedure be sure to drink water.  If your urine is bright yellow, then it is concentrated, and you need to drink more water.  As a general rule after surgery, you should have 8 ounces of water every hour while awake.  If you find you are persistently nauseated or unable to take in liquids let us know.  NO TOBACCO USE or EXPOSURE.  This will slow your healing process and lead to a wound.  WOUND CARE Leave the binder on for 3 days . Use fragrance free soap like Dial, Dove or [place].   After 3 days you can remove the binder to shower. Once dry apply binder or sports bra. If you have liposuction you will have a soft and spongy dressing (Lipofoam) that helps prevent creases in your skin.  Remove before you shower and then replace it.  It is also available on [employer]. If you have steri-strips / tape directly attached to your skin leave them in place. It is OK to get these wet.   No baths, pools or hot tubs for four weeks. We close your incision to leave the smallest and best-looking scar. No ointment or creams on your incisions  for four weeks.  No Neosporin (Too many skin reactions).  A few weeks after surgery you can use Mederma and start massaging the scar. We ask you to wear your binder or sports bra for the first 6 weeks around the clock, including while sleeping. This provides added comfort and helps reduce the fluid accumulation at the surgery site. NO Ice or heating pads to the operative site.  You have a very high risk of a BURN before you feel the temperature change.  ACTIVITY No heavy lifting until cleared by the doctor.  This usually means no more than a half-gallon of milk.  It is OK to walk and climb stairs. Moving your legs is very important to decrease your risk of a blood clot.  It will also help keep you from getting deconditioned.  Every 1 to 2 hours get up and walk for 5 minutes. This will help with a quicker recovery back to normal.  Let pain be your guide so you don't do too much.  This time is for you to recover.  You will be more comfortable if you sleep and rest with your head elevated either with a few pillows under you or in a recliner.  No stomach sleeping for a three months.  WORK Everyone returns to work at different times. As a rough guide, most people take at least 1 - 2 weeks off prior to returning to work. If  you need documentation for your job, give the forms to the front staff at the clinic.  DRIVING Arrange for someone to bring you home from the hospital after your surgery.  You may be able to drive a few days after surgery but not while taking any narcotics or valium.  BOWEL MOVEMENTS Constipation can occur after anesthesia and while taking pain medication.  It is important to stay ahead for your comfort.  We recommend taking Milk of Magnesia (2 tablespoons; twice a day) while taking the pain pills.  MEDICATIONS You may be prescribed should start after surgery At your preoperative visit for you history and physical you may have been given the following medications: An antibiotic: Start  this medication when you get home and take according to the instructions on the bottle. Zofran 4 mg:  This is to treat nausea and vomiting.  You can take this every 6 hours as needed and only if needed. Valium 2 mg for breast cancer patients: This is for muscle tightness if you have an implant or expander. This will help relax your muscle which also helps with pain control.  This can be taken every 12 hours as needed. Don't drive after taking this medication. Norco (hydrocodone/acetaminophen) 5/325 mg:  This is only to be used after you have taken the Motrin or the Tylenol. Every 8 hours as needed.   Over the counter Medication to take: Ibuprofen (Motrin) 600 mg:  Take this every 6 hours.  If you have additional pain then take 500 mg of the Tylenol every 8 hours.  Only take the Norco after you have tried these two. MiraLAX or Milk of Magnesia: Take this according to the bottle if you take the [place] Call your surgeon's office if any of the following occur: Fever 101 degrees F or greater Excessive bleeding or fluid from the incision site. Pain that increases over time without aid from the medications Redness, warmth, or pus draining from incision sites Persistent nausea or inability to take in liquids Severe misshapen area that underwent the operation.  Here are some resources for breast cancer patients:  Plastic surgery website: https://www.plasticsurgery.org/for-medical-professionals/education-and-resources/publications/breast-reconstruction-magazine Breast Reconstruction Awareness Campaign:  [employer]nz Plastic surgery Implant information:  https://www.plasticsurgery.org/patient-safety/breast-implant-safety       Post Anesthesia Home Care Instructions  Activity: Get plenty of rest for the remainder of the day. A responsible individual must stay with you for 24 hours following the procedure.  For the next 24 hours, DO NOT: -Drive a car -Conservation officer, nature -Drink alcoholic beverages -Take any medication unless instructed by your physician -Make any legal decisions or sign important papers.  Meals: Start with liquid foods such as gelatin or soup. Progress to regular foods as tolerated. Avoid greasy, spicy, heavy foods. If nausea and/or vomiting occur, drink only clear liquids until the nausea and/or vomiting subsides. Call your physician if vomiting continues.  Special Instructions/Symptoms: Your throat may feel dry or sore from the anesthesia or the breathing tube placed in your throat during surgery. If this causes discomfort, gargle with warm salt water. The discomfort should disappear within 24 hours.  If you had a scopolamine patch placed behind your ear for the management of post- operative nausea and/or vomiting:  1. The medication in the patch is effective for 72 hours, after which it should be removed.  Wrap patch in a tissue and discard in the trash. Wash hands thoroughly with soap and water. 2. You may remove the patch earlier than 72 hours if  you experience unpleasant side effects which may include dry mouth, dizziness or visual disturbances. 3. Avoid touching the patch. Wash your hands with soap and water after contact with the patch.

## 2022-06-30 NOTE — Anesthesia Postprocedure Evaluation (Signed)
Anesthesia Post Note  Patient: Marie Day  Procedure(s) Performed: REMOVAL OF TISSUE EXPANDER AND PLACEMENT OF IMPLANT (Right: Breast) LEFT BREAST REDUCTION WITH MASTOPEXY (Left: Breast) REMOVAL PORT-A-CATH (Left: Chest)     Patient location during evaluation: PACU Anesthesia Type: General Level of consciousness: awake and alert Pain management: pain level controlled Vital Signs Assessment: post-procedure vital signs reviewed and stable Respiratory status: spontaneous breathing, nonlabored ventilation, respiratory function stable and patient connected to nasal cannula oxygen Cardiovascular status: blood pressure returned to baseline and stable Postop Assessment: no apparent nausea or vomiting Anesthetic complications: no  No notable events documented.  Last Vitals:  Vitals:   06/30/22 1246 06/30/22 1333  BP:  (!) 110/51  Pulse: 73 72  Resp: 13 16  Temp:  36.5 C  SpO2: 95% 94%    Last Pain:  Vitals:   06/30/22 1333  TempSrc:   PainSc: 2                  Marie Day

## 2022-07-01 ENCOUNTER — Ambulatory Visit (INDEPENDENT_AMBULATORY_CARE_PROVIDER_SITE_OTHER): Payer: No Typology Code available for payment source | Admitting: Physician Assistant

## 2022-07-01 ENCOUNTER — Encounter (HOSPITAL_BASED_OUTPATIENT_CLINIC_OR_DEPARTMENT_OTHER): Payer: Self-pay | Admitting: Plastic Surgery

## 2022-07-01 DIAGNOSIS — N651 Disproportion of reconstructed breast: Secondary | ICD-10-CM

## 2022-07-01 NOTE — Progress Notes (Signed)
Marie Day is a 77 year old female evaluated via televisit today status post left-sided mastopexy and right-sided expander to implant by Dr. Marla Roe yesterday 06/30/2022.  [name] evaluated via telehealth, she is in [place] as am I .  Overall she notes she is doing very well.  She notes [name] able to eat yesterday after the surgery without difficulty, she has been up ambulating.  She used 1 ibuprofen and 1 prescribed pain medicine last night that helped her sleep.  She denies any significant discomfort or issues related to her breast.  Overall she is doing well with no significant complaints or concerns, I would like to see her in office at her scheduled follow-up visit.  She understands to reach out to our office immediately if she develops any new or worsening signs or symptoms.  The patient verbalized understanding and agreement to today's plan had no further questions or concerns at today's visit.

## 2022-07-05 ENCOUNTER — Encounter: Payer: No Typology Code available for payment source | Admitting: Surgical

## 2022-07-08 ENCOUNTER — Encounter: Payer: Self-pay | Admitting: Plastic Surgery

## 2022-07-08 ENCOUNTER — Telehealth: Payer: Self-pay | Admitting: Surgical

## 2022-07-08 ENCOUNTER — Ambulatory Visit (INDEPENDENT_AMBULATORY_CARE_PROVIDER_SITE_OTHER): Payer: No Typology Code available for payment source | Admitting: Plastic Surgery

## 2022-07-08 DIAGNOSIS — N651 Disproportion of reconstructed breast: Secondary | ICD-10-CM

## 2022-07-08 MED ORDER — KETOPROFEN 50 MG PO CAPS: 50.0000 mg | ORAL_CAPSULE | Freq: Three times a day (TID) | ORAL | 0 refills | Status: DC

## 2022-07-08 MED ORDER — TRAMADOL HCL 50 MG PO TABS: 50.0000 mg | ORAL_TABLET | Freq: Two times a day (BID) | ORAL | 0 refills | Status: AC | PRN

## 2022-07-08 MED ORDER — KETOPROFEN 50 MG PO CAPS: 50.0000 mg | ORAL_CAPSULE | Freq: Four times a day (QID) | ORAL | 0 refills | Status: DC | PRN

## 2022-07-08 NOTE — Progress Notes (Signed)
   Subjective:    Patient ID: Marie Day, female    DOB: [birth year], 77 y.o.   MRN: [id number]  The patient is a 77 old female here for follow-up after undergoing removal of expander and placement of right breast implant and left breast mastopexy reduction.  We also removed her Port-A-Cath.  She is doing really well and has incredible symmetry in comparison to her preop picture.  She is having still a little bit of pain and discomfort.  This may be related to her arthritis.  She would like to try some Toradol.  I think that is reasonable.  She is also had some episodes of diarrhea which seem to be settling out.  There is no sign of infection in her breasts and she has a little bit of swelling as expected.    Review of Systems  Constitutional: Negative.   Eyes: Negative.   Respiratory: Negative.    Cardiovascular: Negative.   Gastrointestinal: Negative.   Endocrine: Negative.   Genitourinary: Negative.        Objective:   Physical Exam Constitutional:      Appearance: Normal appearance.  Cardiovascular:     Rate and Rhythm: Normal rate.     Pulses: Normal pulses.  Skin:    Capillary Refill: Capillary refill takes less than 2 seconds.  Neurological:     Mental Status: She is alert and oriented to person, place, and time.  Psychiatric:        Mood and Affect: Mood normal.        Behavior: Behavior normal.        Assessment & Plan:     ICD-10-CM   1. Breast asymmetry following reconstructive surgery  N65.1        I would like the patient to go ahead and get some live probiotics for her bowels.  I am also going to send in prescription for Toradol.  Continue with the sports bra and follow-up in 2 weeks.

## 2022-07-08 NOTE — Addendum Note (Signed)
Addended byRoetta Sessions on: 07/08/2022 02:08 PM   Modules accepted: Orders

## 2022-07-08 NOTE — Telephone Encounter (Signed)
Called patient to discuss her recent visit as she had medications called into her pharmacy, however order needed to be updated due to an error.  I called the patient to discuss, she reports that she is still having some pain.  We discussed that she is on aspirin and she reports that she was going to stop this before starting the Toradol.  I discussed with her that that is definitely recommended.  We discussed this further and elected to provide her with a short course of tramadol for pain control.  All of her questions were answered to her content.  PDMP reviewed.  She has had prescriptions for oxycodone in the past for post surgical pain.

## 2022-07-22 ENCOUNTER — Ambulatory Visit: Payer: No Typology Code available for payment source

## 2022-07-22 ENCOUNTER — Ambulatory Visit (INDEPENDENT_AMBULATORY_CARE_PROVIDER_SITE_OTHER): Payer: No Typology Code available for payment source | Admitting: Surgical

## 2022-07-22 DIAGNOSIS — C50411 Malignant neoplasm of upper-outer quadrant of right female breast: Secondary | ICD-10-CM

## 2022-07-22 DIAGNOSIS — Z9889 Other specified postprocedural states: Secondary | ICD-10-CM

## 2022-07-22 DIAGNOSIS — N651 Disproportion of reconstructed breast: Secondary | ICD-10-CM

## 2022-07-22 NOTE — Progress Notes (Signed)
Patient is a very pleasant 77 year old female here for follow-up after right breast expander removal and placement of implant, left breast mastopexy for symmetry and removal of Port-A-Cath on 06/30/2022.  She is 3 weeks postop.  She reports overall she is doing well in regards to her recovery after surgery, reports some ongoing mild pain in the right lateral breast.  She reports that she noticed that the oxycodone has been very helpful for her for sleeping at night, reports that she has noticed that it helped a lot with her breast pain, but also help with her arthritic pain.  She reports that she took the tramadol which was prescribed to her after her last appointment, but reports that it did not work well for her and she did not use it after a single dose.  Chaperone present on exam On exam right breast incision is intact, healing well, Steri-Strip in place.  There is no erythema or cellulitic change of the right breast, no subcutaneous fluid collection noted.  Right NAC surgically absent  Left NAC is viable, left breast incisions are intact, no subcutaneous fluid collection noted palpation. No erythema or cellulitic changes  Left chest wall Port-A-Cath incision is intact, healing well, Steri-Strip was removed.  She has some surrounding irritation from the Steri-Strip, does not appear cellulitic, there is no subcutaneous fluid collection noted.  A/P:  Continue with compressive garments and avoid face activities or heavy lifting until 6 weeks postoperatively.  Recommend monitoring the left chest wall Port-A-Cath incision site, appears irritated to me/developing a rash from the Steri-Strip, however encouraged patient to notify our office if redness spreads or she notices concerning symptoms for infection.  In regard to her request for additional oxycodone, I do recommend she discusses this further with her rheumatologist, if it has been very helpful for her arthritic pain then I would defer treatment  for that to them.  I do not feel as if any additional narcotics related to her surgery would be warranted at this time.  Patient was understanding of this and is going to plan to discuss further with rheumatology.  Recommend following up in 3 weeks for reevaluation. Pictures were obtained of the patient and placed in the chart with the patient's or guardian's permission.

## 2022-08-05 ENCOUNTER — Encounter: Payer: No Typology Code available for payment source | Admitting: Surgical

## 2022-08-05 ENCOUNTER — Encounter: Payer: No Typology Code available for payment source | Admitting: Plastic Surgery

## 2022-08-11 NOTE — Progress Notes (Signed)
Patient is a 77 year old female here for follow-up after removal of right breast tissue expander and placement of implant, left breast mastopexy for symmetry removal of Port-A-Cath on 06/30/2022.  Patient reports overall in regards to her bilateral breast surgery she is doing well.  She endorses dull aching pain to bilateral breast, but reports it is very tolerable.  She does complain of a lot of pain, particularly joint pain and reports a history of OA.  At her last appointment we discussed discussing pain medications with her rheumatologist as she stated she is having joint pain and not necessarily breast pain.  She reports that she discussed this with them and they do not manage pain.  She would like to know additional options  Chaperone present on exam On exam right breast incision is intact and healing well.  Right NAC is surgically absent.  Left chest wall Port-A-Cath incision is intact and healing well.  Left NAC is viable, left breast incision CDI.  There is no erythema or cellulitic changes noted of either breast.  No subcutaneous fluid collection noted.  A/P:  Recommend following up in 6 months for reevaluation of her bilateral breast, we discussed options for fat grafting if she notices volume loss in the superior pole after implant settling as well as tattooing to the right breast for 3D nipple areolar tattoo.  She did not seem interested in this at this time but is aware it is available to her.  We discussed referral to pain management given her ongoing pain, seems to be related to arthritic pain as she points out specific joints such as her wrists, fingers and knee joints.  There is no signs of infection or concern related to her breast surgery, all of her questions were answered to her content.

## 2022-08-12 ENCOUNTER — Ambulatory Visit (INDEPENDENT_AMBULATORY_CARE_PROVIDER_SITE_OTHER): Payer: No Typology Code available for payment source | Admitting: Surgical

## 2022-08-12 VITALS — BP 143/75 | HR 73

## 2022-08-12 DIAGNOSIS — R52 Pain, unspecified: Secondary | ICD-10-CM

## 2022-08-17 ENCOUNTER — Encounter: Payer: Self-pay | Admitting: Physical Medicine and Rehabilitation

## 2022-08-19 ENCOUNTER — Encounter: Payer: No Typology Code available for payment source | Admitting: Surgical

## 2022-08-29 ENCOUNTER — Ambulatory Visit: Payer: No Typology Code available for payment source | Attending: Surgery

## 2022-08-29 VITALS — Wt 157.4 lb

## 2022-08-29 DIAGNOSIS — Z483 Aftercare following surgery for neoplasm: Secondary | ICD-10-CM | POA: Insufficient documentation

## 2022-08-29 NOTE — Therapy (Signed)
OUTPATIENT PHYSICAL THERAPY SOZO SCREENING NOTE   Patient Name: Marie Day MRN: [id number] DOB:08/08/1945, 77 y.o., female Today's Date: 08/29/2022  PCP: Quita Skye, PA-C REFERRING PROVIDER: Harriette Bouillon, MD   PT End of Session - 08/29/22 1553     Visit Number 10   unchanged due to screen only   PT Start Time 1551    PT Stop Time 1555    PT Time Calculation (min) 4 min    Activity Tolerance Patient tolerated treatment well    Behavior During Therapy [facility] for tasks assessed/performed             Past Medical History:  Diagnosis Date   Arthritis    Breast cancer (HCC)    Cancer (HCC)    Past Surgical History:  Procedure Laterality Date   BREAST RECONSTRUCTION WITH PLACEMENT OF TISSUE EXPANDER AND FLEX HD (ACELLULAR HYDRATED DERMIS) Right 06/29/2021   Procedure: RIGHT BREAST RECONSTRUCTION WITH PLACEMENT OF TISSUE EXPANDER AND FLEX HD (ACELLULAR HYDRATED DERMIS);  Surgeon: Allena Napoleon, MD;  Location: [facility];  Service: Plastics;  Laterality: Right;   BREAST REDUCTION WITH MASTOPEXY Left 06/30/2022   Procedure: LEFT BREAST REDUCTION WITH MASTOPEXY;  Surgeon: Peggye Form, DO;  Location: [facility];  Service: Plastics;  Laterality: Left;   MASTECTOMY Right 06/29/2021   MASTECTOMY W/ SENTINEL NODE BIOPSY Right 06/29/2021   Procedure: RIGHT MASTECTOMY WITH SENTINEL LYMPH NODE BIOPSY;  Surgeon: Harriette Bouillon, MD;  Location: [facility];  Service: General;  Laterality: Right;   PORT-A-CATH REMOVAL Left 06/30/2022   Procedure: REMOVAL PORT-A-CATH;  Surgeon: Peggye Form, DO;  Location: [facility];  Service: Plastics;  Laterality: Left;   REMOVAL OF TISSUE EXPANDER AND PLACEMENT OF IMPLANT Right 06/30/2022   Procedure: REMOVAL OF TISSUE EXPANDER AND PLACEMENT OF IMPLANT;  Surgeon: Peggye Form, DO;  Location: [facility];  Service: Plastics;  Laterality: Right;   WISDOM TOOTH  EXTRACTION     Patient Active Problem List   Diagnosis Date Noted   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   Myalgia 02/08/2022   Breast asymmetry following reconstructive surgery 12/24/2021   S/P breast reconstruction 06/29/2021   Port-A-Cath in place 01/18/2021   Malignant neoplasm of upper-outer quadrant of right breast in female, estrogen receptor negative 12/21/2020   Anemia 10/27/2020   Prediabetes 10/27/2020   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   Unspecified age-related cataract 12/14/2015    REFERRING DIAG: right breast cancer at risk for lymphedema  THERAPY DIAG: Aftercare following surgery for neoplasm  PERTINENT HISTORY: [name] with expander on 06/29/21 with 0/4 LN positive. Drains removed 07/15/21. Will be having radiation consult 07/27/21  and will continue HP maintenance every 3 weeks.   PRECAUTIONS: right UE Lymphedema risk, None  SUBJECTIVE: Pt returns for her 3 month L-Dex screen.  PAIN:  Are you having pain? No  SOZO SCREENING: Patient was assessed today using the SOZO machine to determine the lymphedema index score. This was compared to her baseline score. It was determined that she is within the recommended range when compared to her baseline and no further action is needed at this time. She will continue SOZO screenings. These are done every 3 months for 2 years post operatively followed by every 6 months for 2 years, and then annually.  Spent time answering pts questions within scope of practice regarding why the plastic surgeon will 6-12 months after radiation to do her implant  exchange surgery due to the amount of healing that needs to occur (from radiation) before undergoing more trauma for surgery.   L-DEX FLOWSHEETS - 08/29/22 1500       L-DEX LYMPHEDEMA SCREENING   Measurement Type Unilateral    L-DEX MEASUREMENT EXTREMITY Upper Extremity    POSITION  Standing    DOMINANT SIDE Right    At Risk Side Right     BASELINE SCORE (UNILATERAL) -3.8    L-DEX SCORE (UNILATERAL) -0.5    VALUE CHANGE (UNILAT) 3.3              Hermenia Bers, PTA 08/29/2022, 3:54 PM

## 2022-09-01 DIAGNOSIS — M2560 Stiffness of unspecified joint, not elsewhere classified: Secondary | ICD-10-CM | POA: Insufficient documentation

## 2022-09-30 ENCOUNTER — Encounter: Payer: No Typology Code available for payment source | Admitting: Physical Medicine and Rehabilitation

## 2022-11-11 ENCOUNTER — Ambulatory Visit (INDEPENDENT_AMBULATORY_CARE_PROVIDER_SITE_OTHER): Payer: No Typology Code available for payment source | Admitting: Student

## 2022-11-11 VITALS — BP 129/79 | HR 85 | Temp 98.3°F | Resp 18

## 2022-11-11 DIAGNOSIS — Z9889 Other specified postprocedural states: Secondary | ICD-10-CM

## 2022-11-11 DIAGNOSIS — N651 Disproportion of reconstructed breast: Secondary | ICD-10-CM | POA: Diagnosis not present

## 2022-11-11 DIAGNOSIS — Z853 Personal history of malignant neoplasm of breast: Secondary | ICD-10-CM | POA: Diagnosis not present

## 2022-11-11 NOTE — Progress Notes (Signed)
Referring Provider Quita Skye, PA-C [address] [place],  [place] [address]   CC:  Chief Complaint  Patient presents with   Post-op Follow-up      Marie Day is an 77 y.o. female.  HPI: Patient is a 77 year old female with history of right breast cancer status post implant-based reconstruction.  She most recently underwent exchange of tissue expander for implant, capsulotomies for implant repositioning, left breast mastopexy for symmetry and removal of Port-A-Cath with Dr. Ulice Bold on 06/30/2022.  Intraoperatively, she had a [place] smooth round high-profile gel 400 cc implant placed.  She presents to the clinic today with concerns about some discomfort she is having.  Today, patient reports she is overall doing well.  She states that she sometimes has some discomfort to the superior aspect of her right breast.  She describes it as a soreness.  She states it is not constant, but sometimes when she palpates the area or is in a certain position for she feels the soreness.  She also states she is having some achiness to her right axillary region.  She denies any fevers or chills.  She states that she was taking baclofen for separate muscle pain she was having, but is no longer taking that.  She denies any issues with her left breast.  Review of Systems General: Denies any fevers or chills  Physical Exam    11/11/2022    2:27 PM 08/29/2022    3:52 PM 08/12/2022    1:47 PM  Vitals with BMI  Weight  157 lbs 6 oz   Systolic 129  143  Diastolic 79  75  Pulse 85  73    General:  No acute distress,  Alert and oriented, Non-Toxic, Normal speech and affect Chaperone present on exam.  On exam, patient is sitting upright in no acute distress.  Right breast is soft.  Implant is in place.  There is no overlying erythema or ecchymosis.  There are no fluid collections palpated on exam.  Incision appears to be well-healed.  There is no tenderness to palpation in the axilla.  There are no signs  of infection on exam.  Left breast is soft.  NAC appears to be healthy.  Incisions appear to be intact.  There is no overlying erythema or fluid collections on exam.  Assessment/Plan  S/P breast reconstruction - Plan: Ambulatory referral to Physical Therapy  Breast asymmetry following reconstructive surgery   I discussed with the patient that it appears she is healing as expected.  There are no concerning signs on exam for infection or fluid collections.  I discussed with her that she may still be feeling some soreness from the lymph node dissection she underwent as well as may be some soreness from the surgery and/or her capsule.  I discussed with the patient she may try physical therapy as this may help with some of her soreness and pain as well as her range of motion.  Patient stated that she would like to try this.  Order has been placed for referral to physical therapy.  I also discussed with the patient that she may take Tylenol or ibuprofen if she is having soreness.  I discussed with her that she should only take this as needed and should not take it for long periods of time.  Patient expressed understanding.  Patient already has an appointment scheduled with Dr. Ulice Bold in September.  We will plan to have the patient follow-up at that time.  All  of the patient's questions were answered to her satisfaction.  I instructed the patient in the meantime to call if she has any questions or concerns about anything.  Pictures were obtained of the patient and placed in the chart with the patient's or guardian's permission.   Laurena Spies 11/11/2022, 2:53 PM

## 2022-11-14 NOTE — Therapy (Signed)
OUTPATIENT PHYSICAL THERAPY  UPPER EXTREMITY ONCOLOGY EVALUATION  Patient Name: Marie Day MRN: [id number] DOB:11/29/1945, 77 y.o., female Today's Date: 11/15/2022  END OF SESSION:  PT End of Session - 11/15/22 1048     Visit Number 1    Number of Visits 9    Date for PT Re-Evaluation 12/20/22    PT Start Time 1000    PT Stop Time 1048    PT Time Calculation (min) 48 min    Activity Tolerance Patient tolerated treatment well    Behavior During Therapy WFL for tasks assessed/performed             Past Medical History:  Diagnosis Date   Arthritis    Breast cancer (HCC)    Cancer (HCC)    Past Surgical History:  Procedure Laterality Date   BREAST RECONSTRUCTION WITH PLACEMENT OF TISSUE EXPANDER AND FLEX HD (ACELLULAR HYDRATED DERMIS) Right 06/29/2021   Procedure: RIGHT BREAST RECONSTRUCTION WITH PLACEMENT OF TISSUE EXPANDER AND FLEX HD (ACELLULAR HYDRATED DERMIS);  Surgeon: Allena Napoleon, MD;  Location: [facility];  Service: Plastics;  Laterality: Right;   BREAST REDUCTION WITH MASTOPEXY Left 06/30/2022   Procedure: LEFT BREAST REDUCTION WITH MASTOPEXY;  Surgeon: Peggye Form, DO;  Location: [facility];  Service: Plastics;  Laterality: Left;   MASTECTOMY Right 06/29/2021   MASTECTOMY W/ SENTINEL NODE BIOPSY Right 06/29/2021   Procedure: RIGHT MASTECTOMY WITH SENTINEL LYMPH NODE BIOPSY;  Surgeon: Harriette Bouillon, MD;  Location: [facility];  Service: General;  Laterality: Right;   PORT-A-CATH REMOVAL Left 06/30/2022   Procedure: REMOVAL PORT-A-CATH;  Surgeon: Peggye Form, DO;  Location: [facility];  Service: Plastics;  Laterality: Left;   REMOVAL OF TISSUE EXPANDER AND PLACEMENT OF IMPLANT Right 06/30/2022   Procedure: REMOVAL OF TISSUE EXPANDER AND PLACEMENT OF IMPLANT;  Surgeon: Peggye Form, DO;  Location:  [facility];  Service: Plastics;  Laterality: Right;   WISDOM TOOTH  EXTRACTION     Patient Active Problem List   Diagnosis Date Noted   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   Myalgia 02/08/2022   Breast asymmetry following reconstructive surgery 12/24/2021   S/P breast reconstruction 06/29/2021   Port-A-Cath in place 01/18/2021   Malignant neoplasm of upper-outer quadrant of right breast in female, estrogen receptor negative (HCC) 12/21/2020   Anemia 10/27/2020   Prediabetes 10/27/2020   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   Unspecified age-related cataract 12/14/2015    PCP: Quita Skye PA-C  REFERRING PROVIDER: Caroline More PA-C  REFERRING DIAG:  Diagnosis  Z98.890 (ICD-10-CM) - S/P breast reconstruction    THERAPY DIAG:  Aftercare following surgery for neoplasm  Malignant neoplasm of upper-outer quadrant of right breast in female, estrogen receptor negative (HCC)  Muscle weakness (generalized)  Abnormal posture  Breast pain  ONSET DATE: 06/29/21  Rationale for Evaluation and Treatment: Rehabilitation  SUBJECTIVE:  SUBJECTIVE STATEMENT:  EVAL: I have my implant now and it can be tender when I touch it and it aches in the armpit.  It has been like that since the first surgery but maybe a little better.   PERTINENT HISTORY: [name] with expander on 06/29/21 with 0/4 LN positive and switch to implants on 06/30/22. Completed radiation and chemo.  Left shoulder is not good.   PAIN:  Are you having pain? No not at rest only when I raise it up or touch it  NPRS scale: 3/10 Pain location: Rt upper breast and armpit  Pain orientation: Right  PAIN TYPE: aching Pain description: intermittent  Aggravating factors: using it and reaching up  Relieving factors: I have been going for massages every 2 weeks for  neck, shoulders, and back  PRECAUTIONS: Rt lymphedema risk  WEIGHT BEARING RESTRICTIONS: No  FALLS:  Has patient fallen in last 6 months? No  LIVING ENVIRONMENT: Lives with: lives with their family and lives with their spouse  OCCUPATION: Retired  LEISURE: nothing currently - I have silver sneakers but haven't gone   HAND DOMINANCE: right   PRIOR LEVEL OF FUNCTION: Independent  PATIENT GOALS: see if I can improve the pain   OBJECTIVE:  COGNITION: Overall cognitive status: Within functional limits for tasks assessed   PALPATION: +1 ttp Rt latissimus and pectoralis in stretched positions, fatty lipoma near drain site,   OBSERVATIONS / OTHER ASSESSMENTS: well healed, tightness evident in pectoralis, thinner skin axillary/lateral trunk area, one tight cord from lateral incision (not cording just scar)  POSTURE: stiff cervical spine   UPPER EXTREMITY AROM/PROM:  A/PROM RIGHT   Baseline  11/15/22  Shoulder extension 56 60  Shoulder flexion 160 150 - tight more like a pinch on the top of the shoulder  Shoulder abduction 163 155 - tight pectoralis  Shoulder internal rotation    Shoulder external rotation 95 95    (Blank rows = not tested)  CERVICAL AROM: All within normal limits:   UPPER EXTREMITY STRENGTH: 4+/5 no pain Rt arm  QUICK DASH SURVEY: 50% from 37% on last visits     TODAY'S TREATMENT:                                                                                                                                          DATE: 11/15/22  Eval Supine PROM Rt shoulder; education on prevalence of shoulder pain after breast surgery and radiation, radiation changes to skin and muscle.   Brief STM Rt pectoralis Education on HEP to start with demo of each and performance of doorway stretch x 2   PATIENT EDUCATION:  Education details: POC, HEP Person educated: Patient Education method: Explanation, Demonstration, Actor cues, Verbal cues, and  Handouts Education comprehension: verbalized understanding  HOME EXERCISE PROGRAM: Access Code: [email or web address] URL: [email or web address] Date: 11/15/2022 Prepared by: Gwenevere Abbot  Exercises - Supine Chest  Stretch with Elbows Bent  - 1 x daily - 7 x weekly - 1 sets - 3 reps - 30-60seconds hold - Supine Pectoralis Stretch  - 1 x daily - 7 x weekly - 1-3 sets - 3 reps - 20-30 seconds hold - Single Arm Doorway Pec Stretch at 90 Degrees Abduction  - 1 x daily - 7 x weekly - 3 reps - 20-30 second hold - Seated Lateral Trunk Stretch on Swiss Ball  - 1 x daily - 7 x weekly - 1-3 sets - 3 reps - 5-6  seconds hold  ASSESSMENT:  CLINICAL IMPRESSION: Patient is a 77 y.o. female who was seen today for physical therapy evaluation and treatment for her chronic Rt upper quadrant pain after breast surgery.  She has completed radiation, chemotherapy, and has her final implant now placed.  She has not been very active and is not stretching.  She was educated on the importance of stretching and given some stretches to start doing.  We will start PT 1-2x per week x 4 weeks to decrease Rt arm pain and improve mobility.    OBJECTIVE IMPAIRMENTS: decreased knowledge of condition, decreased ROM, and increased fascial restrictions.   ACTIVITY LIMITATIONS: lifting and reach over head  PARTICIPATION LIMITATIONS: none  PERSONAL FACTORS: Time since onset of injury/illness/exacerbation and 1-2 comorbidities: radiation hx, SLNB  are also affecting patient's functional outcome.   REHAB POTENTIAL: Excellent  CLINICAL DECISION MAKING: Stable/uncomplicated  EVALUATION COMPLEXITY: Low  GOALS: Goals reviewed with patient? Yes  SHORT TERM GOALS=LTGs: Target date: 12/20/22  Pt will improve ADL pain levels to 0/10 Baseline: 3/10 with reaching Goal status: INITIAL  2.  Pt will improve Rt shoulder AROM to baseline from last PT visits here Baseline:  Goal status: INITIAL  3.  Pt will be ind with final  HEP Baseline:  Goal status: INITIAL   PLAN:  PT FREQUENCY: 1-2x/week  PT DURATION: 5 weeks   PLANNED INTERVENTIONS: Therapeutic exercises, Patient/Family education, Self Care, Manual therapy, and Re-evaluation  PLAN FOR NEXT SESSION: Review stretches, Rt upper quadrant PROM/MFR  Idamae Lusher, PT 11/15/2022, 10:49 AM

## 2022-11-15 ENCOUNTER — Other Ambulatory Visit: Payer: Self-pay

## 2022-11-15 ENCOUNTER — Encounter: Payer: Self-pay | Admitting: Rehabilitation

## 2022-11-15 ENCOUNTER — Ambulatory Visit: Payer: No Typology Code available for payment source | Attending: Student | Admitting: Rehabilitation

## 2022-11-15 DIAGNOSIS — N644 Mastodynia: Secondary | ICD-10-CM | POA: Diagnosis not present

## 2022-11-15 DIAGNOSIS — Z9889 Other specified postprocedural states: Secondary | ICD-10-CM | POA: Diagnosis not present

## 2022-11-15 DIAGNOSIS — C50411 Malignant neoplasm of upper-outer quadrant of right female breast: Secondary | ICD-10-CM | POA: Diagnosis not present

## 2022-11-15 DIAGNOSIS — R293 Abnormal posture: Secondary | ICD-10-CM | POA: Diagnosis not present

## 2022-11-15 DIAGNOSIS — Z171 Estrogen receptor negative status [ER-]: Secondary | ICD-10-CM

## 2022-11-15 DIAGNOSIS — Z483 Aftercare following surgery for neoplasm: Secondary | ICD-10-CM

## 2022-11-15 DIAGNOSIS — M6281 Muscle weakness (generalized): Secondary | ICD-10-CM | POA: Diagnosis not present

## 2022-11-17 ENCOUNTER — Ambulatory Visit: Payer: No Typology Code available for payment source

## 2022-11-17 DIAGNOSIS — M6281 Muscle weakness (generalized): Secondary | ICD-10-CM

## 2022-11-17 DIAGNOSIS — Z483 Aftercare following surgery for neoplasm: Secondary | ICD-10-CM | POA: Diagnosis not present

## 2022-11-17 DIAGNOSIS — N644 Mastodynia: Secondary | ICD-10-CM

## 2022-11-17 DIAGNOSIS — Z171 Estrogen receptor negative status [ER-]: Secondary | ICD-10-CM

## 2022-11-17 DIAGNOSIS — R293 Abnormal posture: Secondary | ICD-10-CM

## 2022-11-17 NOTE — Therapy (Signed)
OUTPATIENT PHYSICAL THERAPY  UPPER EXTREMITY ONCOLOGY TREATMENT  Patient Name: MAISLEY HAINSWORTH MRN: [id number] DOB:1945-08-22, 77 y.o., female Today's Date: 11/17/2022  END OF SESSION:  PT End of Session - 11/17/22 1000     Visit Number 2    Number of Visits 9    Date for PT Re-Evaluation 12/20/22    PT Start Time 1002    PT Stop Time 1102    PT Time Calculation (min) 60 min    Activity Tolerance Patient tolerated treatment well    Behavior During Therapy WFL for tasks assessed/performed             Past Medical History:  Diagnosis Date   Arthritis    Breast cancer (HCC)    Cancer (HCC)    Past Surgical History:  Procedure Laterality Date   BREAST RECONSTRUCTION WITH PLACEMENT OF TISSUE EXPANDER AND FLEX HD (ACELLULAR HYDRATED DERMIS) Right 06/29/2021   Procedure: RIGHT BREAST RECONSTRUCTION WITH PLACEMENT OF TISSUE EXPANDER AND FLEX HD (ACELLULAR HYDRATED DERMIS);  Surgeon: Allena Napoleon, MD;  Location: [facility];  Service: Plastics;  Laterality: Right;   BREAST REDUCTION WITH MASTOPEXY Left 06/30/2022   Procedure: LEFT BREAST REDUCTION WITH MASTOPEXY;  Surgeon: Peggye Form, DO;  Location: [facility];  Service: Plastics;  Laterality: Left;   MASTECTOMY Right 06/29/2021   MASTECTOMY W/ SENTINEL NODE BIOPSY Right 06/29/2021   Procedure: RIGHT MASTECTOMY WITH SENTINEL LYMPH NODE BIOPSY;  Surgeon: Harriette Bouillon, MD;  Location: [facility];  Service: General;  Laterality: Right;   PORT-A-CATH REMOVAL Left 06/30/2022   Procedure: REMOVAL PORT-A-CATH;  Surgeon: Peggye Form, DO;  Location: [facility];  Service: Plastics;  Laterality: Left;   REMOVAL OF TISSUE EXPANDER AND PLACEMENT OF IMPLANT Right 06/30/2022   Procedure: REMOVAL OF TISSUE EXPANDER AND PLACEMENT OF IMPLANT;  Surgeon: Peggye Form, DO;  Location: [facility];  Service: Plastics;  Laterality: Right;   WISDOM TOOTH  EXTRACTION     Patient Active Problem List   Diagnosis Date Noted   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   Myalgia 02/08/2022   Breast asymmetry following reconstructive surgery 12/24/2021   S/P breast reconstruction 06/29/2021   Port-A-Cath in place 01/18/2021   Malignant neoplasm of upper-outer quadrant of right breast in female, estrogen receptor negative (HCC) 12/21/2020   Anemia 10/27/2020   Prediabetes 10/27/2020   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   Unspecified age-related cataract 12/14/2015    PCP: Quita Skye PA-C  REFERRING PROVIDER: Caroline More PA-C  REFERRING DIAG:  Diagnosis  Z98.890 (ICD-10-CM) - S/P breast reconstruction    THERAPY DIAG:  Aftercare following surgery for neoplasm  Malignant neoplasm of upper-outer quadrant of right breast in female, estrogen receptor negative (HCC)  Muscle weakness (generalized)  Abnormal posture  Breast pain  ONSET DATE: 06/29/21  Rationale for Evaluation and Treatment: Rehabilitation  SUBJECTIVE:  SUBJECTIVE STATEMENT:  My Rt pectoralis has always felt tight since the first surgery but I was getting by. This last surgery though really made it feel tighter.   PERTINENT HISTORY: [name] with expander on 06/29/21 with 0/4 LN positive and switch to implants on 06/30/22. Completed radiation and chemo.  Left shoulder is not good.   PAIN:  Are you having pain? No , mostly just feel tight and uncomfortable at Rt axilla  PRECAUTIONS: Rt lymphedema risk  WEIGHT BEARING RESTRICTIONS: No  FALLS:  Has patient fallen in last 6 months? No  LIVING ENVIRONMENT: Lives with: lives with their family and lives with their spouse  OCCUPATION: Retired  LEISURE: nothing currently - I have silver  sneakers but haven't gone   HAND DOMINANCE: right   PRIOR LEVEL OF FUNCTION: Independent  PATIENT GOALS: see if I can improve the pain   OBJECTIVE:  COGNITION: Overall cognitive status: Within functional limits for tasks assessed   PALPATION: +1 ttp Rt latissimus and pectoralis in stretched positions, fatty lipoma near drain site,   OBSERVATIONS / OTHER ASSESSMENTS: well healed, tightness evident in pectoralis, thinner skin axillary/lateral trunk area, one tight cord from lateral incision (not cording just scar)  POSTURE: stiff cervical spine   UPPER EXTREMITY AROM/PROM:  A/PROM RIGHT   Baseline  11/15/22  Shoulder extension 56 60  Shoulder flexion 160 150 - tight more like a pinch on the top of the shoulder  Shoulder abduction 163 155 - tight pectoralis  Shoulder internal rotation    Shoulder external rotation 95 95    (Blank rows = not tested)  CERVICAL AROM: All within normal limits:   UPPER EXTREMITY STRENGTH: 4+/5 no pain Rt arm  QUICK DASH SURVEY: 50% from 37% on last visits     TODAY'S TREATMENT:                                                                                                                                          DATE:  11/17/22: Manual Therapy P/ROM to Rt shoulder into flexion, scaption and D2 to pts tolerance and with scapular depression by therapist during.  STM to Rt pectoralis insertion and lateral trunk, pt very tight initially but this softened very well during session   11/15/22  Eval Supine PROM Rt shoulder; education on prevalence of shoulder pain after breast surgery and radiation, radiation changes to skin and muscle.   Brief STM Rt pectoralis Education on HEP to start with demo of each and performance of doorway stretch x 2   PATIENT EDUCATION:  Education details: POC, HEP Person educated: Patient Education method: Explanation, Demonstration, Actor cues, Verbal cues, and Handouts Education comprehension: verbalized  understanding  HOME EXERCISE PROGRAM: Access Code: [email or web address] URL: [email or web address] Date: 11/15/2022 Prepared by: Gwenevere Abbot  Exercises - Supine Chest Stretch with Elbows Bent  - 1 x daily - 7 x weekly - 1  sets - 3 reps - 30-60seconds hold - Supine Pectoralis Stretch  - 1 x daily - 7 x weekly - 1-3 sets - 3 reps - 20-30 seconds hold - Single Arm Doorway Pec Stretch at 90 Degrees Abduction  - 1 x daily - 7 x weekly - 3 reps - 20-30 second hold - Seated Lateral Trunk Stretch on Swiss Ball  - 1 x daily - 7 x weekly - 1-3 sets - 3 reps - 5-6  seconds hold  ASSESSMENT:  CLINICAL IMPRESSION: Continued with manual therapy today focusing on manual therapy to decrease Rt upper quadrant tightness. Her Rt lateral trunk softened well during session allowing for improved P/ROM.     OBJECTIVE IMPAIRMENTS: decreased knowledge of condition, decreased ROM, and increased fascial restrictions.   ACTIVITY LIMITATIONS: lifting and reach over head  PARTICIPATION LIMITATIONS: none  PERSONAL FACTORS: Time since onset of injury/illness/exacerbation and 1-2 comorbidities: radiation hx, SLNB  are also affecting patient's functional outcome.   REHAB POTENTIAL: Excellent  CLINICAL DECISION MAKING: Stable/uncomplicated  EVALUATION COMPLEXITY: Low  GOALS: Goals reviewed with patient? Yes  SHORT TERM GOALS=LTGs: Target date: 12/20/22  Pt will improve ADL pain levels to 0/10 Baseline: 3/10 with reaching Goal status: INITIAL  2.  Pt will improve Rt shoulder AROM to baseline from last PT visits here Baseline:  Goal status: INITIAL  3.  Pt will be ind with final HEP Baseline:  Goal status: INITIAL   PLAN:  PT FREQUENCY: 1-2x/week  PT DURATION: 5 weeks   PLANNED INTERVENTIONS: Therapeutic exercises, Patient/Family education, Self Care, Manual therapy, and Re-evaluation  PLAN FOR NEXT SESSION: Review stretches, cont Rt upper quadrant PROM/MFR and STM  Hermenia Bers,  PTA 11/17/2022, 11:22 AM

## 2022-11-21 ENCOUNTER — Ambulatory Visit: Payer: No Typology Code available for payment source | Attending: Student

## 2022-11-21 DIAGNOSIS — N644 Mastodynia: Secondary | ICD-10-CM | POA: Diagnosis not present

## 2022-11-21 DIAGNOSIS — R293 Abnormal posture: Secondary | ICD-10-CM | POA: Diagnosis not present

## 2022-11-21 DIAGNOSIS — C50411 Malignant neoplasm of upper-outer quadrant of right female breast: Secondary | ICD-10-CM | POA: Diagnosis not present

## 2022-11-21 DIAGNOSIS — M6281 Muscle weakness (generalized): Secondary | ICD-10-CM | POA: Insufficient documentation

## 2022-11-21 DIAGNOSIS — Z483 Aftercare following surgery for neoplasm: Secondary | ICD-10-CM | POA: Insufficient documentation

## 2022-11-21 DIAGNOSIS — Z171 Estrogen receptor negative status [ER-]: Secondary | ICD-10-CM | POA: Insufficient documentation

## 2022-11-21 NOTE — Therapy (Signed)
OUTPATIENT PHYSICAL THERAPY  UPPER EXTREMITY ONCOLOGY TREATMENT  Patient Name: Marie Day MRN: [id number] DOB:January 17, 1946, 77 y.o., female Today's Date: 11/21/2022  END OF SESSION:  PT End of Session - 11/21/22 1407     Visit Number 3    Number of Visits 9    Date for PT Re-Evaluation 12/20/22    PT Start Time 1403    PT Stop Time 1505    PT Time Calculation (min) 62 min    Activity Tolerance Patient tolerated treatment well    Behavior During Therapy WFL for tasks assessed/performed             Past Medical History:  Diagnosis Date   Arthritis    Breast cancer (HCC)    Cancer (HCC)    Past Surgical History:  Procedure Laterality Date   BREAST RECONSTRUCTION WITH PLACEMENT OF TISSUE EXPANDER AND FLEX HD (ACELLULAR HYDRATED DERMIS) Right 06/29/2021   Procedure: RIGHT BREAST RECONSTRUCTION WITH PLACEMENT OF TISSUE EXPANDER AND FLEX HD (ACELLULAR HYDRATED DERMIS);  Surgeon: Allena Napoleon, MD;  Location: [facility];  Service: Plastics;  Laterality: Right;   BREAST REDUCTION WITH MASTOPEXY Left 06/30/2022   Procedure: LEFT BREAST REDUCTION WITH MASTOPEXY;  Surgeon: Peggye Form, DO;  Location: [facility];  Service: Plastics;  Laterality: Left;   MASTECTOMY Right 06/29/2021   MASTECTOMY W/ SENTINEL NODE BIOPSY Right 06/29/2021   Procedure: RIGHT MASTECTOMY WITH SENTINEL LYMPH NODE BIOPSY;  Surgeon: Harriette Bouillon, MD;  Location: [facility];  Service: General;  Laterality: Right;   PORT-A-CATH REMOVAL Left 06/30/2022   Procedure: REMOVAL PORT-A-CATH;  Surgeon: Peggye Form, DO;  Location: [facility];  Service: Plastics;  Laterality: Left;   REMOVAL OF TISSUE EXPANDER AND PLACEMENT OF IMPLANT Right 06/30/2022   Procedure: REMOVAL OF TISSUE EXPANDER AND PLACEMENT OF IMPLANT;  Surgeon: Peggye Form, DO;  Location:  S[facility];  Service: Plastics;  Laterality: Right;   WISDOM TOOTH  EXTRACTION     Patient Active Problem List   Diagnosis Date Noted   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   Myalgia 02/08/2022   Breast asymmetry following reconstructive surgery 12/24/2021   S/P breast reconstruction 06/29/2021   Port-A-Cath in place 01/18/2021   Malignant neoplasm of upper-outer quadrant of right breast in female, estrogen receptor negative (HCC) 12/21/2020   Anemia 10/27/2020   Prediabetes 10/27/2020   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   Unspecified age-related cataract 12/14/2015    PCP: Quita Skye PA-C  REFERRING PROVIDER: Caroline More PA-C  REFERRING DIAG:  Diagnosis  Z98.890 (ICD-10-CM) - S/P breast reconstruction    THERAPY DIAG:  Aftercare following surgery for neoplasm  Malignant neoplasm of upper-outer quadrant of right breast in female, estrogen receptor negative (HCC)  Muscle weakness (generalized)  Abnormal posture  Breast pain  ONSET DATE: 06/29/21  Rationale for Evaluation and Treatment: Rehabilitation  SUBJECTIVE:  SUBJECTIVE STATEMENT:  I am getting better. My shoulder pain is slowly improving along with my reach.   PERTINENT HISTORY: [name] with expander on 06/29/21 with 0/4 LN positive and switch to implants on 06/30/22. Completed radiation and chemo.  Left shoulder is not good.   PAIN:  Are you having pain? PAIN:  Are you having pain? Yes NPRS scale: 1/10 Pain location: bil shoulders Pain orientation: Right and Left  PAIN TYPE: aching Pain description: intermittent  Aggravating factors: rolling to Rt side increases Lt shoulder pain  Relieving factors: physical therapy is helping some    PRECAUTIONS: Rt lymphedema risk  WEIGHT BEARING RESTRICTIONS: No  FALLS:  Has patient fallen in  last 6 months? No  LIVING ENVIRONMENT: Lives with: lives with their family and lives with their spouse  OCCUPATION: Retired  LEISURE: nothing currently - I have silver sneakers but haven't gone   HAND DOMINANCE: right   PRIOR LEVEL OF FUNCTION: Independent  PATIENT GOALS: see if I can improve the pain   OBJECTIVE:  COGNITION: Overall cognitive status: Within functional limits for tasks assessed   PALPATION: +1 ttp Rt latissimus and pectoralis in stretched positions, fatty lipoma near drain site,   OBSERVATIONS / OTHER ASSESSMENTS: well healed, tightness evident in pectoralis, thinner skin axillary/lateral trunk area, one tight cord from lateral incision (not cording just scar)  POSTURE: stiff cervical spine   UPPER EXTREMITY AROM/PROM:  A/PROM RIGHT   Baseline  11/15/22  Shoulder extension 56 60  Shoulder flexion 160 150 - tight more like a pinch on the top of the shoulder  Shoulder abduction 163 155 - tight pectoralis  Shoulder internal rotation    Shoulder external rotation 95 95    (Blank rows = not tested)  CERVICAL AROM: All within normal limits:   UPPER EXTREMITY STRENGTH: 4+/5 no pain Rt arm  QUICK DASH SURVEY: 50% from 37% on last visits     TODAY'S TREATMENT:                                                                                                                                          DATE:  11/21/22: Therapeutic Exercises Pulleys into flexion and abd x 2 mins each returning therapist demo; tactile and VC's to decrease Lt scapular compensation throughout Roll yellow ball up wall into flexion x 10 and then bil UE abd x 5 each with VC's during to decrease Lt scapular compensation Supine over half foam roll for following: Bil UE horz abd x 10, then bil UE scaption into a "V" x 10, and then bil UE abd into a "snow angel" x5 for 5 sec holds Manual Therapy P/ROM to Rt shoulder into flexion, scaption and D2 to pts tolerance and with scapular depression  by therapist during STM to Rt pectoralis insertion and lateral trunk, this was less tight today than last week  11/17/22: Manual Therapy  P/ROM to Rt shoulder into flexion, scaption and D2 to pts tolerance and with scapular depression by therapist during.  STM to Rt pectoralis insertion and lateral trunk, pt very tight initially but this softened very well during session   11/15/22  Eval Supine PROM Rt shoulder; education on prevalence of shoulder pain after breast surgery and radiation, radiation changes to skin and muscle.   Brief STM Rt pectoralis Education on HEP to start with demo of each and performance of doorway stretch x 2   PATIENT EDUCATION:  Education details: POC, HEP Person educated: Patient Education method: Explanation, Demonstration, Actor cues, Verbal cues, and Handouts Education comprehension: verbalized understanding  HOME EXERCISE PROGRAM: Access Code: [email or web address] URL: [email or web address] Date: 11/15/2022 Prepared by: Gwenevere Abbot  Exercises - Supine Chest Stretch with Elbows Bent  - 1 x daily - 7 x weekly - 1 sets - 3 reps - 30-60seconds hold - Supine Pectoralis Stretch  - 1 x daily - 7 x weekly - 1-3 sets - 3 reps - 20-30 seconds hold - Single Arm Doorway Pec Stretch at 90 Degrees Abduction  - 1 x daily - 7 x weekly - 3 reps - 20-30 second hold - Seated Lateral Trunk Stretch on Swiss Ball  - 1 x daily - 7 x weekly - 1-3 sets - 3 reps - 5-6  seconds hold  ASSESSMENT:  CLINICAL IMPRESSION: Progressed pt to include A/ and AA/ROM. She did well with this without having increased pain, but did require multiple VC's to decrease Lt scapular compensation with AA/ROM exs. Then also continued with manual therapy working to decrease Rt upper quadrant tightness and improve pain free bil shoulder P/ROM.     OBJECTIVE IMPAIRMENTS: decreased knowledge of condition, decreased ROM, and increased fascial restrictions.   ACTIVITY LIMITATIONS: lifting and reach  over head  PARTICIPATION LIMITATIONS: none  PERSONAL FACTORS: Time since onset of injury/illness/exacerbation and 1-2 comorbidities: radiation hx, SLNB  are also affecting patient's functional outcome.   REHAB POTENTIAL: Excellent  CLINICAL DECISION MAKING: Stable/uncomplicated  EVALUATION COMPLEXITY: Low  GOALS: Goals reviewed with patient? Yes  SHORT TERM GOALS=LTGs: Target date: 12/20/22  Pt will improve ADL pain levels to 0/10 Baseline: 3/10 with reaching Goal status: INITIAL  2.  Pt will improve Rt shoulder AROM to baseline from last PT visits here Baseline:  Goal status: INITIAL  3.  Pt will be ind with final HEP Baseline:  Goal status: INITIAL   PLAN:  PT FREQUENCY: 1-2x/week  PT DURATION: 5 weeks   PLANNED INTERVENTIONS: Therapeutic exercises, Patient/Family education, Self Care, Manual therapy, and Re-evaluation  PLAN FOR NEXT SESSION: Cont AA/A/ROM exs, cont Rt upper quadrant PROM/MFR and STM and progress HEP prn  Hermenia Bers, PTA 11/21/2022, 3:06 PM

## 2022-11-23 ENCOUNTER — Ambulatory Visit: Payer: No Typology Code available for payment source

## 2022-11-23 DIAGNOSIS — Z483 Aftercare following surgery for neoplasm: Secondary | ICD-10-CM | POA: Diagnosis not present

## 2022-11-23 DIAGNOSIS — R293 Abnormal posture: Secondary | ICD-10-CM

## 2022-11-23 DIAGNOSIS — C50411 Malignant neoplasm of upper-outer quadrant of right female breast: Secondary | ICD-10-CM

## 2022-11-23 DIAGNOSIS — N644 Mastodynia: Secondary | ICD-10-CM

## 2022-11-23 DIAGNOSIS — M6281 Muscle weakness (generalized): Secondary | ICD-10-CM

## 2022-11-23 NOTE — Therapy (Signed)
OUTPATIENT PHYSICAL THERAPY  UPPER EXTREMITY ONCOLOGY TREATMENT  Patient Name: Marie Day MRN: [id number] DOB:12-Jun-1945, 77 y.o., female Today's Date: 11/23/2022  END OF SESSION:  PT End of Session - 11/23/22 1000     Visit Number 4    Number of Visits 9    Date for PT Re-Evaluation 12/20/22    PT Start Time 0959    PT Stop Time 1056    PT Time Calculation (min) 57 min    Activity Tolerance Patient tolerated treatment well    Behavior During Therapy [facility] for tasks assessed/performed             Past Medical History:  Diagnosis Date   Arthritis    Breast cancer (HCC)    Cancer (HCC)    Past Surgical History:  Procedure Laterality Date   BREAST RECONSTRUCTION WITH PLACEMENT OF TISSUE EXPANDER AND FLEX HD (ACELLULAR HYDRATED DERMIS) Right 06/29/2021   Procedure: RIGHT BREAST RECONSTRUCTION WITH PLACEMENT OF TISSUE EXPANDER AND FLEX HD (ACELLULAR HYDRATED DERMIS);  Surgeon: Allena Napoleon, MD;  Location: [facility];  Service: Plastics;  Laterality: Right;   BREAST REDUCTION WITH MASTOPEXY Left 06/30/2022   Procedure: LEFT BREAST REDUCTION WITH MASTOPEXY;  Surgeon: Peggye Form, DO;  Location: [facility];  Service: Plastics;  Laterality: Left;   MASTECTOMY Right 06/29/2021   MASTECTOMY W/ SENTINEL NODE BIOPSY Right 06/29/2021   Procedure: RIGHT MASTECTOMY WITH SENTINEL LYMPH NODE BIOPSY;  Surgeon: Harriette Bouillon, MD;  Location: [facility];  Service: General;  Laterality: Right;   PORT-A-CATH REMOVAL Left 06/30/2022   Procedure: REMOVAL PORT-A-CATH;  Surgeon: Peggye Form, DO;  Location: [facility];  Service: Plastics;  Laterality: Left;   REMOVAL OF TISSUE EXPANDER AND PLACEMENT OF IMPLANT Right 06/30/2022   Procedure: REMOVAL OF TISSUE EXPANDER AND PLACEMENT OF IMPLANT;  Surgeon: Peggye Form, DO;  Location: [facility];  Service: Plastics;  Laterality: Right;   WISDOM TOOTH  EXTRACTION     Patient Active Problem List   Diagnosis Date Noted   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   Myalgia 02/08/2022   Breast asymmetry following reconstructive surgery 12/24/2021   S/P breast reconstruction 06/29/2021   Port-A-Cath in place 01/18/2021   Malignant neoplasm of upper-outer quadrant of right breast in female, estrogen receptor negative (HCC) 12/21/2020   Anemia 10/27/2020   Prediabetes 10/27/2020   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   Unspecified age-related cataract 12/14/2015    PCP: Quita Skye PA-C  REFERRING PROVIDER: Caroline More PA-C  REFERRING DIAG:  Diagnosis  Z98.890 (ICD-10-CM) - S/P breast reconstruction    THERAPY DIAG:  Aftercare following surgery for neoplasm  Malignant neoplasm of upper-outer quadrant of right breast in female, estrogen receptor negative (HCC)  Muscle weakness (generalized)  Abnormal posture  Breast pain  ONSET DATE: 06/29/21  Rationale for Evaluation and Treatment: Rehabilitation  SUBJECTIVE:  SUBJECTIVE STATEMENT:  I'm working on doing the HEP stretches and trying to use my arm more with ADL's. I tried the doorway stretch this morning that you showed me last time and that already felt better than it did here the other day. Also Monday night I slept really well as my shoulder didn't wake me up for the first time in awhile.And I got an appt at [facility] for next Wed (7/10) to be measured for a compression bra.   PERTINENT HISTORY: [name] with expander on 06/29/21 with 0/4 LN positive and switched to implants on 06/30/22. Completed radiation and chemo.  Left shoulder is not good.   PAIN:  Are you having pain? PAIN:  Are you having pain? Yes NPRS scale: 1-2/10 Pain location:  bil shoulders Pain orientation: Right and Left  PAIN TYPE: aching Pain description: intermittent  Aggravating factors: rolling to Rt side increases Lt shoulder pain  Relieving factors: physical therapy is helping some    PRECAUTIONS: Rt lymphedema risk  WEIGHT BEARING RESTRICTIONS: No  FALLS:  Has patient fallen in last 6 months? No  LIVING ENVIRONMENT: Lives with: lives with their family and lives with their spouse  OCCUPATION: Retired  LEISURE: nothing currently - I have silver sneakers but haven't gone   HAND DOMINANCE: right   PRIOR LEVEL OF FUNCTION: Independent  PATIENT GOALS: see if I can improve the pain   OBJECTIVE:  COGNITION: Overall cognitive status: Within functional limits for tasks assessed   PALPATION: +1 ttp Rt latissimus and pectoralis in stretched positions, fatty lipoma near drain site,   OBSERVATIONS / OTHER ASSESSMENTS: well healed, tightness evident in pectoralis, thinner skin axillary/lateral trunk area, one tight cord from lateral incision (not cording just scar)  POSTURE: stiff cervical spine   UPPER EXTREMITY AROM/PROM:  A/PROM RIGHT   Baseline  11/15/22  Shoulder extension 56 60  Shoulder flexion 160 150 - tight more like a pinch on the top of the shoulder  Shoulder abduction 163 155 - tight pectoralis  Shoulder internal rotation    Shoulder external rotation 95 95    (Blank rows = not tested)  CERVICAL AROM: All within normal limits:   UPPER EXTREMITY STRENGTH: 4+/5 no pain Rt arm  QUICK DASH SURVEY: 50% from 37% on last visits     TODAY'S TREATMENT:                                                                                                                                          DATE:  11/23/22: Therapeutic Exs Pulleys into flexion and abd x 2 mins each with VC's to remind to decrease Rt scapular compensation Roll yellow ball up wall into flexion x 10 and then Rt UE abd x 10 Supine over half foam roll for following: Bil  UE horz abd x 10, then bil UE scaption into a "V" x 10, and  then bil UE abd into a "snow angel" x 10 for 5 sec holds; alt UE flexion x 10 each Manual Therapy P/ROM to Rt shoulder into flexion, scaption and D2 to pts tolerance and with scapular depression by therapist during STM to Rt pectoralis insertion and lateral trunk during P/ROM; briefly over port incision for scar tissue mobs where pt reports feeling occasional pull from scar tissue with certain Lt UE A/ROM  11/21/22: Therapeutic Exercises Pulleys into flexion and abd x 2 mins each returning therapist demo; tactile and VC's to decrease Lt scapular compensation throughout Roll yellow ball up wall into flexion x 10 and then bil UE abd x 5 each with VC's during to decrease Lt scapular compensation Supine over half foam roll for following: Bil UE horz abd x 10, then bil UE scaption into a "V" x 10, and then bil UE abd into a "snow angel" x5 for 5 sec holds Manual Therapy P/ROM to Rt shoulder into flexion, scaption and D2 to pts tolerance and with scapular depression by therapist during STM to Rt pectoralis insertion and lateral trunk, this was less tight today than last week  11/17/22: Manual Therapy P/ROM to Rt shoulder into flexion, scaption and D2 to pts tolerance and with scapular depression by therapist during.  STM to Rt pectoralis insertion and lateral trunk, pt very tight initially but this softened very well during session     PATIENT EDUCATION:  Education details: POC, HEP Person educated: Patient Education method: Explanation, Demonstration, Tactile cues, Verbal cues, and Handouts Education comprehension: verbalized understanding  HOME EXERCISE PROGRAM: Access Code: [email or web address] URL: [email or web address] Date: 11/15/2022 Prepared by: Gwenevere Abbot  Exercises - Supine Chest Stretch with Elbows Bent  - 1 x daily - 7 x weekly - 1 sets - 3 reps - 30-60seconds hold - Supine Pectoralis Stretch  - 1 x daily - 7 x weekly  - 1-3 sets - 3 reps - 20-30 seconds hold - Single Arm Doorway Pec Stretch at 90 Degrees Abduction  - 1 x daily - 7 x weekly - 3 reps - 20-30 second hold - Seated Lateral Trunk Stretch on Swiss Ball  - 1 x daily - 7 x weekly - 1-3 sets - 3 reps - 5-6  seconds hold  ASSESSMENT:  CLINICAL IMPRESSION: Continued with AA/A/ROM stretches. Pt with improved technique today as her Rt scapular compensations were less. Then also continued with manual therapy working to decrease her Rt upper quadrant tightness. Her end P/ROM has improved well since start of care.    OBJECTIVE IMPAIRMENTS: decreased knowledge of condition, decreased ROM, and increased fascial restrictions.   ACTIVITY LIMITATIONS: lifting and reach over head  PARTICIPATION LIMITATIONS: none  PERSONAL FACTORS: Time since onset of injury/illness/exacerbation and 1-2 comorbidities: radiation hx, SLNB  are also affecting patient's functional outcome.   REHAB POTENTIAL: Excellent  CLINICAL DECISION MAKING: Stable/uncomplicated  EVALUATION COMPLEXITY: Low  GOALS: Goals reviewed with patient? Yes  SHORT TERM GOALS=LTGs: Target date: 12/20/22  Pt will improve ADL pain levels to 0/10 Baseline: 3/10 with reaching Goal status: INITIAL  2.  Pt will improve Rt shoulder AROM to baseline from last PT visits here Baseline:  Goal status: INITIAL  3.  Pt will be ind with final HEP Baseline:  Goal status: INITIAL   PLAN:  PT FREQUENCY: 1-2x/week  PT DURATION: 5 weeks   PLANNED INTERVENTIONS: Therapeutic exercises, Patient/Family education, Self Care, Manual therapy, and Re-evaluation  PLAN FOR NEXT SESSION: Measure A/ROM of Rt shoulder to assess current  progress; Cont AA/A/ROM exs, cont Rt upper quadrant PROM/MFR and STM and progress HEP prn  Hermenia Bers, PTA 11/23/2022, 10:57 AM

## 2022-11-28 ENCOUNTER — Ambulatory Visit: Payer: No Typology Code available for payment source

## 2022-11-28 VITALS — Wt 161.5 lb

## 2022-11-28 DIAGNOSIS — R293 Abnormal posture: Secondary | ICD-10-CM

## 2022-11-28 DIAGNOSIS — Z483 Aftercare following surgery for neoplasm: Secondary | ICD-10-CM

## 2022-11-28 DIAGNOSIS — N644 Mastodynia: Secondary | ICD-10-CM

## 2022-11-28 DIAGNOSIS — M6281 Muscle weakness (generalized): Secondary | ICD-10-CM

## 2022-11-28 DIAGNOSIS — Z171 Estrogen receptor negative status [ER-]: Secondary | ICD-10-CM

## 2022-11-28 NOTE — Therapy (Signed)
OUTPATIENT PHYSICAL THERAPY  UPPER EXTREMITY ONCOLOGY TREATMENT  Patient Name: Marie Day MRN: [id number] DOB:November 12, 1945, 77 y.o., female Today's Date: 11/28/2022  END OF SESSION:  PT End of Session - 11/28/22 1105     Visit Number 5    Number of Visits 9    Date for PT Re-Evaluation 12/20/22    PT Start Time 1100    PT Stop Time 1159    PT Time Calculation (min) 59 min    Activity Tolerance Patient tolerated treatment well    Behavior During Therapy WFL for tasks assessed/performed             Past Medical History:  Diagnosis Date   Arthritis    Breast cancer (HCC)    Cancer (HCC)    Past Surgical History:  Procedure Laterality Date   BREAST RECONSTRUCTION WITH PLACEMENT OF TISSUE EXPANDER AND FLEX HD (ACELLULAR HYDRATED DERMIS) Right 06/29/2021   Procedure: RIGHT BREAST RECONSTRUCTION WITH PLACEMENT OF TISSUE EXPANDER AND FLEX HD (ACELLULAR HYDRATED DERMIS);  Surgeon: Allena Napoleon, MD;  Location: [facility];  Service: Plastics;  Laterality: Right;   BREAST REDUCTION WITH MASTOPEXY Left 06/30/2022   Procedure: LEFT BREAST REDUCTION WITH MASTOPEXY;  Surgeon: Peggye Form, DO;  Location: [facility];  Service: Plastics;  Laterality: Left;   MASTECTOMY Right 06/29/2021   MASTECTOMY W/ SENTINEL NODE BIOPSY Right 06/29/2021   Procedure: RIGHT MASTECTOMY WITH SENTINEL LYMPH NODE BIOPSY;  Surgeon: Harriette Bouillon, MD;  Location: [facility];  Service: General;  Laterality: Right;   PORT-A-CATH REMOVAL Left 06/30/2022   Procedure: REMOVAL PORT-A-CATH;  Surgeon: Peggye Form, DO;  Location: [facility];  Service: Plastics;  Laterality: Left;   REMOVAL OF TISSUE EXPANDER AND PLACEMENT OF IMPLANT Right 06/30/2022   Procedure: REMOVAL OF TISSUE EXPANDER AND PLACEMENT OF IMPLANT;  Surgeon: Peggye Form, DO;  Location: [facility];  Service: Plastics;  Laterality: Right;   WISDOM TOOTH  EXTRACTION     Patient Active Problem List   Diagnosis Date Noted   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   Myalgia 02/08/2022   Breast asymmetry following reconstructive surgery 12/24/2021   S/P breast reconstruction 06/29/2021   Port-A-Cath in place 01/18/2021   Malignant neoplasm of upper-outer quadrant of right breast in female, estrogen receptor negative (HCC) 12/21/2020   Anemia 10/27/2020   Prediabetes 10/27/2020   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   Unspecified age-related cataract 12/14/2015    PCP: Quita Skye PA-C  REFERRING PROVIDER: Caroline More PA-C  REFERRING DIAG:  Diagnosis  Z98.890 (ICD-10-CM) - S/P breast reconstruction    THERAPY DIAG:  Aftercare following surgery for neoplasm  Malignant neoplasm of upper-outer quadrant of right breast in female, estrogen receptor negative (HCC)  Muscle weakness (generalized)  Abnormal posture  Breast pain  ONSET DATE: 06/29/21  Rationale for Evaluation and Treatment: Rehabilitation  SUBJECTIVE:  SUBJECTIVE STATEMENT:  I had some trouble sleeping the past few nights. I was having increased pain across my shoulders and neck. I also had a sharp pain in my head that I hadn't had before but it just did that once and stopped. I was going to go to the ED if it kept up. It feels better right now.   PERTINENT HISTORY: [name] with expander on 06/29/21 with 0/4 LN positive and switched to implants on 06/30/22. Completed radiation and chemo.  Left shoulder is not good.   PAIN:  Are you having pain? PAIN:  Are you having pain? No, not right now  PRECAUTIONS: Rt lymphedema risk  WEIGHT BEARING RESTRICTIONS: No  FALLS:  Has patient fallen in last 6 months? No  LIVING ENVIRONMENT: Lives  with: lives with their family and lives with their spouse  OCCUPATION: Retired  LEISURE: nothing currently - I have silver sneakers but haven't gone   HAND DOMINANCE: right   PRIOR LEVEL OF FUNCTION: Independent  PATIENT GOALS: see if I can improve the pain   OBJECTIVE:  COGNITION: Overall cognitive status: Within functional limits for tasks assessed   PALPATION: +1 ttp Rt latissimus and pectoralis in stretched positions, fatty lipoma near drain site,   OBSERVATIONS / OTHER ASSESSMENTS: well healed, tightness evident in pectoralis, thinner skin axillary/lateral trunk area, one tight cord from lateral incision (not cording just scar)  POSTURE: stiff cervical spine   UPPER EXTREMITY AROM/PROM:  A/PROM RIGHT   Baseline  11/15/22 11/28/22  Shoulder extension 56 60   Shoulder flexion 160 150 - tight more like a pinch on the top of the shoulder 135- no pinch and pt with less compensation  Shoulder abduction 163 155 - tight pectoralis 163  Shoulder internal rotation     Shoulder external rotation 95 95     (Blank rows = not tested)  CERVICAL AROM: All within normal limits:   UPPER EXTREMITY STRENGTH: 4+/5 no pain Rt arm  QUICK DASH SURVEY: 50% from 37% on last visits     TODAY'S TREATMENT:                                                                                                                                          DATE:  11/28/22: Therapeutic Exs Pulleys into flexion and abd x 2 mins each with VC's to remind to decrease Rt scapular compensation Roll yellow ball up wall into flexion x 10 and Rt UE abd x 10 SOZO redone and she is WNLs of her baseline Supine over half foam roll for following: Bil UE horz abd x 10, then bil UE scaption into a "V" x 10; improved motion noted since last week with the exs Manual Therapy P/ROM to Rt shoulder into flexion, scaption and D2 to pts tolerance and with scapular depression by therapist throughout, limited scapular mobility  noted STM to Rt pectoralis insertion  and lateral trunk during P/ROM' also along Rt medial scapular border during scap mobs Scap Mobs in Lt S/L to Rt scapula into protraction and retraction with limited mobility and tightness noted initially but this improved some  11/23/22: Therapeutic Exs Pulleys into flexion and abd x 2 mins each with VC's to remind to decrease Rt scapular compensation Roll yellow ball up wall into flexion x 10 and then Rt UE abd x 10 Supine over half foam roll for following: Bil UE horz abd x 10, then bil UE scaption into a "V" x 10, and then bil UE abd into a "snow angel" x 10 for 5 sec holds; alt UE flexion x 10 each Manual Therapy P/ROM to Rt shoulder into flexion, scaption and D2 to pts tolerance and with scapular depression by therapist during STM to Rt pectoralis insertion and lateral trunk during P/ROM; briefly over port incision for scar tissue mobs where pt reports feeling occasional pull from scar tissue with certain Lt UE A/ROM  11/21/22: Therapeutic Exercises Pulleys into flexion and abd x 2 mins each returning therapist demo; tactile and VC's to decrease Lt scapular compensation throughout Roll yellow ball up wall into flexion x 10 and then bil UE abd x 5 each with VC's during to decrease Lt scapular compensation Supine over half foam roll for following: Bil UE horz abd x 10, then bil UE scaption into a "V" x 10, and then bil UE abd into a "snow angel" x5 for 5 sec holds Manual Therapy P/ROM to Rt shoulder into flexion, scaption and D2 to pts tolerance and with scapular depression by therapist during STM to Rt pectoralis insertion and lateral trunk, this was less tight today than last week  11/17/22: Manual Therapy P/ROM to Rt shoulder into flexion, scaption and D2 to pts tolerance and with scapular depression by therapist during.  STM to Rt pectoralis insertion and lateral trunk, pt very tight initially but this softened very well during session     PATIENT  EDUCATION:  Education details: POC, HEP Person educated: Patient Education method: Explanation, Demonstration, Tactile cues, Verbal cues, and Handouts Education comprehension: verbalized understanding  HOME EXERCISE PROGRAM: Access Code: [email or web address] URL: [email or web address] Date: 11/15/2022 Prepared by: Gwenevere Abbot  Exercises - Supine Chest Stretch with Elbows Bent  - 1 x daily - 7 x weekly - 1 sets - 3 reps - 30-60seconds hold - Supine Pectoralis Stretch  - 1 x daily - 7 x weekly - 1-3 sets - 3 reps - 20-30 seconds hold - Single Arm Doorway Pec Stretch at 90 Degrees Abduction  - 1 x daily - 7 x weekly - 3 reps - 20-30 second hold - Seated Lateral Trunk Stretch on Swiss Ball  - 1 x daily - 7 x weekly - 1-3 sets - 3 reps - 5-6  seconds hold  ASSESSMENT:  CLINICAL IMPRESSION: Pt comes in reporting she had some increased pain across her shoulders and neck over the weekend but is better now. Continued with AA/A/ROM exs and improved A/ROM was noted with supine over half foam roll. Also her P/ROM of Rt shoulder was better as well though decreased scapular mobility seems to be a limiting factor to her end Rt shoulder ROM. Her Rt shoulder A/ROM flexion was less than when last measured however she is doing this with less compensation now, her abd was improved.     OBJECTIVE IMPAIRMENTS: decreased knowledge of condition, decreased ROM, and increased fascial restrictions.   ACTIVITY LIMITATIONS: lifting and reach over  head  PARTICIPATION LIMITATIONS: none  PERSONAL FACTORS: Time since onset of injury/illness/exacerbation and 1-2 comorbidities: radiation hx, SLNB  are also affecting patient's functional outcome.   REHAB POTENTIAL: Excellent  CLINICAL DECISION MAKING: Stable/uncomplicated  EVALUATION COMPLEXITY: Low  GOALS: Goals reviewed with patient? Yes  SHORT TERM GOALS=LTGs: Target date: 12/20/22  Pt will improve ADL pain levels to 0/10 Baseline: 3/10 with  reaching Goal status: INITIAL  2.  Pt will improve Rt shoulder AROM to baseline from last PT visits here Baseline:  Goal status: INITIAL  3.  Pt will be ind with final HEP Baseline:  Goal status: INITIAL   PLAN:  PT FREQUENCY: 1-2x/week  PT DURATION: 5 weeks   PLANNED INTERVENTIONS: Therapeutic exercises, Patient/Family education, Self Care, Manual therapy, and Re-evaluation  PLAN FOR NEXT SESSION: Cont AA/A/ROM exs, cont Rt upper quadrant PROM/MFR and STM, ready to progress to postural strength? and progress HEP prn  Hermenia Bers, PTA 11/28/2022, 12:44 PM

## 2022-11-29 ENCOUNTER — Encounter
Payer: No Typology Code available for payment source | Attending: Physical Medicine and Rehabilitation | Admitting: Physical Medicine and Rehabilitation

## 2022-11-29 VITALS — BP 155/89 | HR 77 | Ht 65.0 in | Wt 162.0 lb

## 2022-11-29 DIAGNOSIS — G4701 Insomnia due to medical condition: Secondary | ICD-10-CM | POA: Diagnosis not present

## 2022-11-29 DIAGNOSIS — Z171 Estrogen receptor negative status [ER-]: Secondary | ICD-10-CM | POA: Diagnosis not present

## 2022-11-29 DIAGNOSIS — G894 Chronic pain syndrome: Secondary | ICD-10-CM | POA: Diagnosis not present

## 2022-11-29 DIAGNOSIS — C50411 Malignant neoplasm of upper-outer quadrant of right female breast: Secondary | ICD-10-CM | POA: Diagnosis not present

## 2022-11-29 NOTE — Patient Instructions (Signed)

## 2022-11-29 NOTE — Progress Notes (Signed)
Subjective:    Patient ID: Marie Day, female    DOB: [birth year], 77 y.o.   MRN: [id number]  HPI [name] is a 77 year old woman who presents to establish care for pain.  1) Chronic pain: -in January she started to have pain all over the body -her pain has improved -chemo stopped her debilitating pain but it restarted afterward but not at the same level of severity -she takes aspirin or ibuprofen -mostly the pain is still present but at 1-2  2) Lump in breast: -within a month of finding this she had 5 procedures, and MRI, a blood test, a port -got chemo and radiation  3) Insomnia -die to pain  Pain Inventory Average Pain 5 Pain Right Now 2 My pain is constant, sharp, dull, tingling, and aching  In the last 24 hours, has pain interfered with the following? General activity 6 Relation with others 2 Enjoyment of life 3 What TIME of day is your pain at its worst? evening and night Sleep (in general) Poor  Pain is worse with: bending, inactivity, standing, and some activites Pain improves with: therapy/exercise and medication Relief from Meds: 2  walk without assistance how many minutes can you walk? 30 ability to climb steps?  yes do you drive?  yes transfers alone Do you have any goals in this area?  yes  retired  bladder control problems tingling  none  New patient    Family History  Problem Relation Age of Onset   Bone cancer Mother    Pancreatic cancer Maternal Grandmother    Pancreatic cancer Paternal Grandmother    Colon cancer Neg Hx    Breast cancer Neg Hx    Social History   Socioeconomic History   Marital status: Married    Spouse name: Not on file   Number of children: Not on file   Years of education: Not on file   Highest education level: Not on file  Occupational History   Not on file  Tobacco Use   Smoking status: Never   Smokeless tobacco: Never  Vaping Use   Vaping Use: Not on file  Substance and Sexual Activity    Alcohol use: Never   Drug use: Never   Sexual activity: Not on file  Other Topics Concern   Not on file  Social History Narrative   Not on file   Social Determinants of Health   Financial Resource Strain: Not on file  Food Insecurity: Not on file  Transportation Needs: Not on file  Physical Activity: Not on file  Stress: Not on file  Social Connections: Not on file   Past Surgical History:  Procedure Laterality Date   BREAST RECONSTRUCTION WITH PLACEMENT OF TISSUE EXPANDER AND FLEX HD (ACELLULAR HYDRATED DERMIS) Right 06/29/2021   Procedure: RIGHT BREAST RECONSTRUCTION WITH PLACEMENT OF TISSUE EXPANDER AND FLEX HD (ACELLULAR HYDRATED DERMIS);  Surgeon: Allena Napoleon, MD;  Location: [facility];  Service: Plastics;  Laterality: Right;   BREAST REDUCTION WITH MASTOPEXY Left 06/30/2022   Procedure: LEFT BREAST REDUCTION WITH MASTOPEXY;  Surgeon: Peggye Form, DO;  Location: [facility];  Service: Plastics;  Laterality: Left;   MASTECTOMY Right 06/29/2021   MASTECTOMY W/ SENTINEL NODE BIOPSY Right 06/29/2021   Procedure: RIGHT MASTECTOMY WITH SENTINEL LYMPH NODE BIOPSY;  Surgeon: Harriette Bouillon, MD;  Location: [facility];  Service: General;  Laterality: Right;   PORT-A-CATH REMOVAL Left 06/30/2022   Procedure: REMOVAL PORT-A-CATH;  Surgeon: Peggye Form, DO;  Location: [facility];  Service: Plastics;  Laterality: Left;   REMOVAL OF TISSUE EXPANDER AND PLACEMENT OF IMPLANT Right 06/30/2022   Procedure: REMOVAL OF TISSUE EXPANDER AND PLACEMENT OF IMPLANT;  Surgeon: Peggye Form, DO;  Location: [facility];  Service: Plastics;  Laterality: Right;   WISDOM TOOTH EXTRACTION     Past Medical History:  Diagnosis Date   Arthritis    Breast cancer (HCC)    Cancer (HCC)    Ht 5\' 5"  (1.651 m)   Wt 162 lb (73.5 kg)   BMI 26.96 kg/m   Opioid Risk Score:   Fall Risk Score:  `1  Depression screen  [facility] 2/9     11/29/2022   10:15 AM  Depression screen PHQ 2/9  Decreased Interest 1  Down, Depressed, Hopeless 0  PHQ - 2 Score 1  Altered sleeping 3  Tired, decreased energy 2  Feeling bad or failure about yourself  1  Trouble concentrating 0  Moving slowly or fidgety/restless 1  Suicidal thoughts 0  PHQ-9 Score 8      Review of Systems  Constitutional:        Night sweats weight gain  Gastrointestinal:  Positive for constipation and diarrhea.  Genitourinary:        Urine retention  All other systems reviewed and are negative.      Objective:   Physical Exam Gen: no distress, normal appearing HEENT: oral mucosa pink and moist, NCAT Cardio: Reg rate Chest: normal effort, normal rate of breathing Abd: soft, non-distended Ext: no edema Psych: pleasant, normal affect Skin: intact Neuro: Alert and oriented x3 Musculoskeletal: Tight cervical myofascia     Assessment & Plan:   1) s/p mastectomy for breast cancer: -continue PT -discussed pain below her surgical site  2) Cervical myofascial pain syndrome -Provided with a pain relief journal and discussed that it contains foods and lifestyle tips to naturally help to improve pain. Discussed that these lifestyle strategies are also very good for health unlike some medications which can have negative side effects. Discussed that the act of keeping a journal can be therapeutic and helpful to realize patterns what helps to trigger and alleviate pain.    -discussed trigger point injections  -discussed heat therapy  3) Insomnia: -discussed topamax but we will try tart cherry juice first -Try to go outside near sunrise -Get exercise during the day.  -Turn off all devices an hour before bedtime.  -Teas that can benefit: chamomile, valerian root, Brahmi (Bacopa) -Can consider over the counter melatonin, magnesium, and/or L-theanine. Melatonin is an anti-oxidant with multiple health benefits. Magnesium is involved in greater  than 300 enzymatic reactions in the body and most of [place] are deficient as our soil is often depleted. There are 7 different types of magnesium- Bioptemizer's is a supplement with all 7 types, and each has unique benefits. Magnesium can also help with constipation and anxiety.  -Pistachios naturally increase the production of melatonin -[employer] bamboo bed sheets are free from toxic chemicals.  -Tart cherry juice or a tart cherry supplement can improve sleep and soreness post-workout

## 2022-11-30 ENCOUNTER — Encounter: Payer: Self-pay | Admitting: Rehabilitation

## 2022-11-30 ENCOUNTER — Ambulatory Visit: Payer: No Typology Code available for payment source | Attending: Student | Admitting: Rehabilitation

## 2022-11-30 DIAGNOSIS — Z171 Estrogen receptor negative status [ER-]: Secondary | ICD-10-CM | POA: Insufficient documentation

## 2022-11-30 DIAGNOSIS — N644 Mastodynia: Secondary | ICD-10-CM | POA: Diagnosis not present

## 2022-11-30 DIAGNOSIS — R293 Abnormal posture: Secondary | ICD-10-CM | POA: Diagnosis not present

## 2022-11-30 DIAGNOSIS — Z483 Aftercare following surgery for neoplasm: Secondary | ICD-10-CM | POA: Insufficient documentation

## 2022-11-30 DIAGNOSIS — C50411 Malignant neoplasm of upper-outer quadrant of right female breast: Secondary | ICD-10-CM | POA: Insufficient documentation

## 2022-11-30 DIAGNOSIS — M6281 Muscle weakness (generalized): Secondary | ICD-10-CM | POA: Insufficient documentation

## 2022-11-30 DIAGNOSIS — Z853 Personal history of malignant neoplasm of breast: Secondary | ICD-10-CM | POA: Diagnosis not present

## 2022-11-30 NOTE — Therapy (Signed)
OUTPATIENT PHYSICAL THERAPY  UPPER EXTREMITY ONCOLOGY TREATMENT  Patient Name: Marie Day MRN: [id number] DOB:1945/12/16, 77 y.o., female Today's Date: 11/30/2022  END OF SESSION:  PT End of Session - 11/30/22 1203     Visit Number 6    Number of Visits 9    Date for PT Re-Evaluation 12/20/22    PT Start Time 1205    PT Stop Time 1255    PT Time Calculation (min) 50 min    Activity Tolerance Patient tolerated treatment well    Behavior During Therapy [facility] for tasks assessed/performed             Past Medical History:  Diagnosis Date   Arthritis    Breast cancer (HCC)    Cancer (HCC)    Past Surgical History:  Procedure Laterality Date   BREAST RECONSTRUCTION WITH PLACEMENT OF TISSUE EXPANDER AND FLEX HD (ACELLULAR HYDRATED DERMIS) Right 06/29/2021   Procedure: RIGHT BREAST RECONSTRUCTION WITH PLACEMENT OF TISSUE EXPANDER AND FLEX HD (ACELLULAR HYDRATED DERMIS);  Surgeon: Allena Napoleon, MD;  Location: [facility];  Service: Plastics;  Laterality: Right;   BREAST REDUCTION WITH MASTOPEXY Left 06/30/2022   Procedure: LEFT BREAST REDUCTION WITH MASTOPEXY;  Surgeon: Peggye Form, DO;  Location: [facility];  Service: Plastics;  Laterality: Left;   MASTECTOMY Right 06/29/2021   MASTECTOMY W/ SENTINEL NODE BIOPSY Right 06/29/2021   Procedure: RIGHT MASTECTOMY WITH SENTINEL LYMPH NODE BIOPSY;  Surgeon: Harriette Bouillon, MD;  Location: [facility];  Service: General;  Laterality: Right;   PORT-A-CATH REMOVAL Left 06/30/2022   Procedure: REMOVAL PORT-A-CATH;  Surgeon: Peggye Form, DO;  Location: [facility];  Service: Plastics;  Laterality: Left;   REMOVAL OF TISSUE EXPANDER AND PLACEMENT OF IMPLANT Right 06/30/2022   Procedure: REMOVAL OF TISSUE EXPANDER AND PLACEMENT OF IMPLANT;  Surgeon: Peggye Form, DO;  Location:  [facility];  Service: Plastics;  Laterality: Right;   WISDOM TOOTH  EXTRACTION     Patient Active Problem List   Diagnosis Date Noted   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   Myalgia 02/08/2022   Breast asymmetry following reconstructive surgery 12/24/2021   S/P breast reconstruction 06/29/2021   Port-A-Cath in place 01/18/2021   Malignant neoplasm of upper-outer quadrant of right breast in female, estrogen receptor negative (HCC) 12/21/2020   Anemia 10/27/2020   Prediabetes 10/27/2020   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   Unspecified age-related cataract 12/14/2015    PCP: Quita Skye PA-C  REFERRING PROVIDER: Caroline More PA-C  REFERRING DIAG:  Diagnosis  Z98.890 (ICD-10-CM) - S/P breast reconstruction    THERAPY DIAG:  Aftercare following surgery for neoplasm  Malignant neoplasm of upper-outer quadrant of right breast in female, estrogen receptor negative (HCC)  Muscle weakness (generalized)  Abnormal posture  Breast pain  ONSET DATE: 06/29/21  Rationale for Evaluation and Treatment: Rehabilitation  SUBJECTIVE:  SUBJECTIVE STATEMENT:  It was pretty good except for yesterday and today.  The neck and shoulders are just achy,  The implant is achy, and the lateral trunk actually hurts to press on it.  I saw a pain doctor yesterday   PERTINENT HISTORY: [name] with expander on 06/29/21 with 0/4 LN positive and switched to implants on 06/30/22. Completed radiation and chemo.  Left shoulder is not good.   PAIN:  Are you having pain? It doesn't hurt at rest but it is around 5-6 with movement.  Rt chest, axilla,  neck and shoulders    PRECAUTIONS: Rt lymphedema risk  WEIGHT BEARING RESTRICTIONS: No  FALLS:  Has patient fallen in last 6 months? No  LIVING ENVIRONMENT: Lives with: lives with their  family and lives with their spouse  OCCUPATION: Retired  LEISURE: nothing currently - I have silver sneakers but haven't gone   HAND DOMINANCE: right   PRIOR LEVEL OF FUNCTION: Independent  PATIENT GOALS: see if I can improve the pain   OBJECTIVE:  COGNITION: Overall cognitive status: Within functional limits for tasks assessed   PALPATION: +1 ttp Rt latissimus and pectoralis in stretched positions, fatty lipoma near drain site,   OBSERVATIONS / OTHER ASSESSMENTS: well healed, tightness evident in pectoralis, thinner skin axillary/lateral trunk area, one tight cord from lateral incision (not cording just scar)  POSTURE: stiff cervical spine   UPPER EXTREMITY AROM/PROM:  A/PROM RIGHT   Baseline  11/15/22 11/28/22  Shoulder extension 56 60   Shoulder flexion 160 150 - tight more like a pinch on the top of the shoulder 135- no pinch and pt with less compensation  Shoulder abduction 163 155 - tight pectoralis 163  Shoulder internal rotation     Shoulder external rotation 95 95     (Blank rows = not tested)  CERVICAL AROM: All within normal limits:   UPPER EXTREMITY STRENGTH: 4+/5 no pain Rt arm  QUICK DASH SURVEY: 50% from 37% on last visits     TODAY'S TREATMENT:                                                                                                                                          DATE:  11/30/22: Therapeutic Exs Pulleys into flexion and abd x 2 mins each with VC's to remind to decrease Rt scapular compensation Roll yellow ball up wall into flexion x 10 and Rt UE abd x 10 Supine with no roll today due to increased pain Bil UE horz abd x 10, then bil UE scaption into a "V" x 10 Manual Therapy P/ROM to Rt shoulder into flexion, scaption and D2 to pts tolerance and with scapular depression by therapist throughout, limited scapular mobility noted STM to Rt pectoralis insertion and lateral trunk during P/ROM  11/28/22: Therapeutic Exs Pulleys into flexion  and abd x 2 mins each with VC's to remind to  decrease Rt scapular compensation Roll yellow ball up wall into flexion x 10 and Rt UE abd x 10 SOZO redone and she is WNLs of her baseline Supine over half foam roll for following: Bil UE horz abd x 10, then bil UE scaption into a "V" x 10; improved motion noted since last week with the exs Manual Therapy P/ROM to Rt shoulder into flexion, scaption and D2 to pts tolerance and with scapular depression by therapist throughout, limited scapular mobility noted STM to Rt pectoralis insertion and lateral trunk during P/ROM' also along Rt medial scapular border during scap mobs Scap Mobs in Lt S/L to Rt scapula into protraction and retraction with limited mobility and tightness noted initially but this improved some  11/23/22: Therapeutic Exs Pulleys into flexion and abd x 2 mins each with VC's to remind to decrease Rt scapular compensation Roll yellow ball up wall into flexion x 10 and then Rt UE abd x 10 Supine over half foam roll for following: Bil UE horz abd x 10, then bil UE scaption into a "V" x 10, and then bil UE abd into a "snow angel" x 10 for 5 sec holds; alt UE flexion x 10 each Manual Therapy P/ROM to Rt shoulder into flexion, scaption and D2 to pts tolerance and with scapular depression by therapist during STM to Rt pectoralis insertion and lateral trunk during P/ROM; briefly over port incision for scar tissue mobs where pt reports feeling occasional pull from scar tissue with certain Lt UE A/ROM  PATIENT EDUCATION:  Education details: POC, HEP Person educated: Patient Education method: Explanation, Demonstration, Actor cues, Verbal cues, and Handouts Education comprehension: verbalized understanding  HOME EXERCISE PROGRAM: Access Code: [email or web address] URL: [email or web address] Date: 11/15/2022 Prepared by: Gwenevere Abbot  Exercises - Supine Chest Stretch with Elbows Bent  - 1 x daily - 7 x weekly - 1 sets - 3 reps -  30-60seconds hold - Supine Pectoralis Stretch  - 1 x daily - 7 x weekly - 1-3 sets - 3 reps - 20-30 seconds hold - Single Arm Doorway Pec Stretch at 90 Degrees Abduction  - 1 x daily - 7 x weekly - 3 reps - 20-30 second hold - Seated Lateral Trunk Stretch on Swiss Ball  - 1 x daily - 7 x weekly - 1-3 sets - 3 reps - 5-6  seconds hold  ASSESSMENT:  CLINICAL IMPRESSION: Pt comes in reporting she had some increased pain across her shoulders and neck. Continued with AA/A/ROM exs and improved A/ROM was noted with supine over half foam roll. Noted a small round hard lump near the anterior chest near the medial edge of a scar from an old horse bite.  Pt also feels it and will let Dr. Pamelia Hoit know.   OBJECTIVE IMPAIRMENTS: decreased knowledge of condition, decreased ROM, and increased fascial restrictions.   ACTIVITY LIMITATIONS: lifting and reach over head  PARTICIPATION LIMITATIONS: none  PERSONAL FACTORS: Time since onset of injury/illness/exacerbation and 1-2 comorbidities: radiation hx, SLNB  are also affecting patient's functional outcome.   REHAB POTENTIAL: Excellent  CLINICAL DECISION MAKING: Stable/uncomplicated  EVALUATION COMPLEXITY: Low  GOALS: Goals reviewed with patient? Yes  SHORT TERM GOALS=LTGs: Target date: 12/20/22  Pt will improve ADL pain levels to 0/10 Baseline: 3/10 with reaching Goal status: INITIAL  2.  Pt will improve Rt shoulder AROM to baseline from last PT visits here Baseline:  Goal status: INITIAL  3.  Pt will be ind with final HEP Baseline:  Goal status: INITIAL  PLAN:  PT FREQUENCY: 1-2x/week  PT DURATION: 5 weeks   PLANNED INTERVENTIONS: Therapeutic exercises, Patient/Family education, Self Care, Manual therapy, and Re-evaluation  PLAN FOR NEXT SESSION: Cont AA/A/ROM exs, cont Rt upper quadrant PROM/MFR and STM, ready to progress to postural strength? and progress HEP prn  Idamae Lusher, PT 11/30/2022, 12:55 PM

## 2022-12-05 ENCOUNTER — Ambulatory Visit: Payer: No Typology Code available for payment source

## 2022-12-05 DIAGNOSIS — R293 Abnormal posture: Secondary | ICD-10-CM

## 2022-12-05 DIAGNOSIS — Z483 Aftercare following surgery for neoplasm: Secondary | ICD-10-CM

## 2022-12-05 DIAGNOSIS — C50411 Malignant neoplasm of upper-outer quadrant of right female breast: Secondary | ICD-10-CM

## 2022-12-05 DIAGNOSIS — M6281 Muscle weakness (generalized): Secondary | ICD-10-CM

## 2022-12-05 DIAGNOSIS — N644 Mastodynia: Secondary | ICD-10-CM

## 2022-12-05 NOTE — Therapy (Signed)
OUTPATIENT PHYSICAL THERAPY  UPPER EXTREMITY ONCOLOGY TREATMENT  Patient Name: Marie Day MRN: [id number] DOB:13-May-1946, 77 y.o., female Today's Date: 12/05/2022  END OF SESSION:  PT End of Session - 12/05/22 1111     Visit Number 7    Number of Visits 9    Date for PT Re-Evaluation 12/20/22    PT Start Time 1109    PT Stop Time 1202    PT Time Calculation (min) 53 min    Activity Tolerance Patient tolerated treatment well    Behavior During Therapy [facility] for tasks assessed/performed             Past Medical History:  Diagnosis Date   Arthritis    Breast cancer (HCC)    Cancer (HCC)    Past Surgical History:  Procedure Laterality Date   BREAST RECONSTRUCTION WITH PLACEMENT OF TISSUE EXPANDER AND FLEX HD (ACELLULAR HYDRATED DERMIS) Right 06/29/2021   Procedure: RIGHT BREAST RECONSTRUCTION WITH PLACEMENT OF TISSUE EXPANDER AND FLEX HD (ACELLULAR HYDRATED DERMIS);  Surgeon: Allena Napoleon, MD;  Location: [facility];  Service: Plastics;  Laterality: Right;   BREAST REDUCTION WITH MASTOPEXY Left 06/30/2022   Procedure: LEFT BREAST REDUCTION WITH MASTOPEXY;  Surgeon: Peggye Form, DO;  Location: [facility];  Service: Plastics;  Laterality: Left;   MASTECTOMY Right 06/29/2021   MASTECTOMY W/ SENTINEL NODE BIOPSY Right 06/29/2021   Procedure: RIGHT MASTECTOMY WITH SENTINEL LYMPH NODE BIOPSY;  Surgeon: Harriette Bouillon, MD;  Location: [facility];  Service: General;  Laterality: Right;   PORT-A-CATH REMOVAL Left 06/30/2022   Procedure: REMOVAL PORT-A-CATH;  Surgeon: Peggye Form, DO;  Location: [facility];  Service: Plastics;  Laterality: Left;   REMOVAL OF TISSUE EXPANDER AND PLACEMENT OF IMPLANT Right 06/30/2022   Procedure: REMOVAL OF TISSUE EXPANDER AND PLACEMENT OF IMPLANT;  Surgeon: Peggye Form, DO;  Location: [facility];  Service: Plastics;  Laterality: Right;   WISDOM TOOTH  EXTRACTION     Patient Active Problem List   Diagnosis Date Noted   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   Myalgia 02/08/2022   Breast asymmetry following reconstructive surgery 12/24/2021   S/P breast reconstruction 06/29/2021   Port-A-Cath in place 01/18/2021   Malignant neoplasm of upper-outer quadrant of right breast in female, estrogen receptor negative (HCC) 12/21/2020   Anemia 10/27/2020   Prediabetes 10/27/2020   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   Unspecified age-related cataract 12/14/2015    PCP: Quita Skye PA-C  REFERRING PROVIDER: Caroline More PA-C  REFERRING DIAG:  Diagnosis  Z98.890 (ICD-10-CM) - S/P breast reconstruction    THERAPY DIAG:  Aftercare following surgery for neoplasm  Malignant neoplasm of upper-outer quadrant of right breast in female, estrogen receptor negative (HCC)  Muscle weakness (generalized)  Abnormal posture  Breast pain  ONSET DATE: 06/29/21  Rationale for Evaluation and Treatment: Rehabilitation  SUBJECTIVE:  SUBJECTIVE STATEMENT:  I felt better after Diannia Ruder worked with me last time. I have a spot at the superior medial aspect of my breast that I have an appt to get looked at 7/31. And I got my new bras last week. They are not compression bras but even having  that much support is helping to decrease my hypersensitivity at my [name].     PERTINENT HISTORY: [name] with expander on 06/29/21 with 0/4 LN positive and switched to implants on 06/30/22. Completed radiation and chemo.  Left shoulder is not good.   PAIN:  Are you having pain? No, just occasional ache that doesn't limit activity  PRECAUTIONS: Rt lymphedema risk  WEIGHT BEARING RESTRICTIONS: No  FALLS:  Has patient fallen in last 6  months? No  LIVING ENVIRONMENT: Lives with: lives with their family and lives with their spouse  OCCUPATION: Retired  LEISURE: nothing currently - I have silver sneakers but haven't gone   HAND DOMINANCE: right   PRIOR LEVEL OF FUNCTION: Independent  PATIENT GOALS: see if I can improve the pain   OBJECTIVE:  COGNITION: Overall cognitive status: Within functional limits for tasks assessed   PALPATION: +1 ttp Rt latissimus and pectoralis in stretched positions, fatty lipoma near drain site,   OBSERVATIONS / OTHER ASSESSMENTS: well healed, tightness evident in pectoralis, thinner skin axillary/lateral trunk area, one tight cord from lateral incision (not cording just scar)  POSTURE: stiff cervical spine   UPPER EXTREMITY AROM/PROM:  A/PROM RIGHT   Baseline  11/15/22 11/28/22  Shoulder extension 56 60   Shoulder flexion 160 150 - tight more like a pinch on the top of the shoulder 135- no pinch and pt with less compensation  Shoulder abduction 163 155 - tight pectoralis 163  Shoulder internal rotation     Shoulder external rotation 95 95     (Blank rows = not tested)  CERVICAL AROM: All within normal limits:   UPPER EXTREMITY STRENGTH: 4+/5 no pain Rt arm  QUICK DASH SURVEY: 50% from 37% on last visits     TODAY'S TREATMENT:                                                                                                                                          DATE:  12/05/22: Therapeutic Exs Pulleys into flexion and abd x 2 mins each  Roll yellow ball up wall into flexion x 10 and Rt UE abd x 10 Supine scapular series with yellow theraband x 10 each, all done 10 reps except bil D2 5 reps; handout issued as well Manual Therapy P/ROM to Rt shoulder into flexion, scaption and D2 to pts tolerance and with scapular depression by therapist throughout, limited scapular mobility noted STM to Rt pectoralis insertion and lateral trunk during P/ROM  11/30/22: Therapeutic  Exs Pulleys into flexion and abd x 2 mins each with VC's to remind  to decrease Rt scapular compensation Roll yellow ball up wall into flexion x 10 and Rt UE abd x 10 Supine with no roll today due to increased pain Bil UE horz abd x 10, then bil UE scaption into a "V" x 10 Manual Therapy P/ROM to Rt shoulder into flexion, scaption and D2 to pts tolerance and with scapular depression by therapist throughout, limited scapular mobility noted STM to Rt pectoralis insertion and lateral trunk during P/ROM  11/28/22: Therapeutic Exs Pulleys into flexion and abd x 2 mins each with VC's to remind to decrease Rt scapular compensation Roll yellow ball up wall into flexion x 10 and Rt UE abd x 10 SOZO redone and she is WNLs of her baseline Supine over half foam roll for following: Bil UE horz abd x 10, then bil UE scaption into a "V" x 10; improved motion noted since last week with the exs Manual Therapy P/ROM to Rt shoulder into flexion, scaption and D2 to pts tolerance and with scapular depression by therapist throughout, limited scapular mobility noted STM to Rt pectoralis insertion and lateral trunk during P/ROM' also along Rt medial scapular border during scap mobs Scap Mobs in Lt S/L to Rt scapula into protraction and retraction with limited mobility and tightness noted initially but this improved some  11/23/22: Therapeutic Exs Pulleys into flexion and abd x 2 mins each with VC's to remind to decrease Rt scapular compensation Roll yellow ball up wall into flexion x 10 and then Rt UE abd x 10 Supine over half foam roll for following: Bil UE horz abd x 10, then bil UE scaption into a "V" x 10, and then bil UE abd into a "snow angel" x 10 for 5 sec holds; alt UE flexion x 10 each Manual Therapy P/ROM to Rt shoulder into flexion, scaption and D2 to pts tolerance and with scapular depression by therapist during STM to Rt pectoralis insertion and lateral trunk during P/ROM; briefly over port incision for  scar tissue mobs where pt reports feeling occasional pull from scar tissue with certain Lt UE A/ROM  PATIENT EDUCATION:  Education details: POC, HEP Person educated: Patient Education method: Explanation, Demonstration, Actor cues, Verbal cues, and Handouts Education comprehension: verbalized understanding  HOME EXERCISE PROGRAM: Access Code: [email or web address] URL: [email or web address] Date: 11/15/2022 Prepared by: Gwenevere Abbot  Exercises - Supine Chest Stretch with Elbows Bent  - 1 x daily - 7 x weekly - 1 sets - 3 reps - 30-60seconds hold - Supine Pectoralis Stretch  - 1 x daily - 7 x weekly - 1-3 sets - 3 reps - 20-30 seconds hold - Single Arm Doorway Pec Stretch at 90 Degrees Abduction  - 1 x daily - 7 x weekly - 3 reps - 20-30 second hold - Seated Lateral Trunk Stretch on Swiss Ball  - 1 x daily - 7 x weekly - 1-3 sets - 3 reps - 5-6  seconds hold  ASSESSMENT:  CLINICAL IMPRESSION: Pts pain was better toady and she got an appt to have small lump assessed in [name]. Continued with AA/ROM exs and added supine scapular series with yellow theraband. She tolerated these well without increased pain. Then continued with manual therapy working to decrease Rt upper quadrant tightness.   OBJECTIVE IMPAIRMENTS: decreased knowledge of condition, decreased ROM, and increased fascial restrictions.   ACTIVITY LIMITATIONS: lifting and reach over head  PARTICIPATION LIMITATIONS: none  PERSONAL FACTORS: Time since onset of injury/illness/exacerbation and 1-2 comorbidities: radiation hx, SLNB  are also  affecting patient's functional outcome.   REHAB POTENTIAL: Excellent  CLINICAL DECISION MAKING: Stable/uncomplicated  EVALUATION COMPLEXITY: Low  GOALS: Goals reviewed with patient? Yes  SHORT TERM GOALS=LTGs: Target date: 12/20/22  Pt will improve ADL pain levels to 0/10 Baseline: 3/10 with reaching Goal status: INITIAL  2.  Pt will improve Rt shoulder AROM to baseline from  last PT visits here Baseline:  Goal status: INITIAL  3.  Pt will be ind with final HEP Baseline:  Goal status: INITIAL   PLAN:  PT FREQUENCY: 1-2x/week  PT DURATION: 5 weeks   PLANNED INTERVENTIONS: Therapeutic exercises, Patient/Family education, Self Care, Manual therapy, and Re-evaluation  PLAN FOR NEXT SESSION: Cont AA/A/ROM exs, cont Rt upper quadrant PROM/MFR and STM, review supine scapular series and progress HEP prn  Hermenia Bers, PTA 12/05/2022, 12:02 PM   Over Head Pull: Narrow and Wide Grip   Cancer Rehab [phone]   On back, knees bent, feet flat, band across thighs, elbows straight but relaxed. Pull hands apart (start). Keeping elbows straight, bring arms up and over head, hands toward floor. Keep pull steady on band. Hold momentarily. Return slowly, keeping pull steady, back to start. Then do same with a wider grip on the band (past shoulder width) Repeat _5-10__ times. Band color __yellow____   Side Pull: Double Arm   On back, knees bent, feet flat. Arms perpendicular to body, shoulder level, elbows straight but relaxed. Pull arms out to sides, elbows straight. Resistance band comes across collarbones, hands toward floor. Hold momentarily. Slowly return to starting position. Repeat _5-10__ times. Band color _yellow____   Sword   On back, knees bent, feet flat, left hand on left hip, right hand above left. Pull right arm DIAGONALLY (hip to shoulder) across chest. Bring right arm along head toward floor. Hold momentarily. Slowly return to starting position. Repeat _5-10__ times. Do with left arm. Band color _yellow_____   Shoulder Rotation: Double Arm   On back, knees bent, feet flat, elbows tucked at sides, bent 90, hands palms up. Pull hands apart and down toward floor, keeping elbows near sides. Hold momentarily. Slowly return to starting position. Repeat _5-10__ times. Band color __yellow____

## 2022-12-05 NOTE — Patient Instructions (Signed)
 Over Head Pull: Narrow and Wide Grip   Cancer Rehab [phone]   On back, knees bent, feet flat, band across thighs, elbows straight but relaxed. Pull hands apart (start). Keeping elbows straight, bring arms up and over head, hands toward floor. Keep pull steady on band. Hold momentarily. Return slowly, keeping pull steady, back to start. Then do same with a wider grip on the band (past shoulder width) Repeat _5-10__ times. Band color __yellow____   Side Pull: Double Arm   On back, knees bent, feet flat. Arms perpendicular to body, shoulder level, elbows straight but relaxed. Pull arms out to sides, elbows straight. Resistance band comes across collarbones, hands toward floor. Hold momentarily. Slowly return to starting position. Repeat _5-10__ times. Band color _yellow____   Sword   On back, knees bent, feet flat, left hand on left hip, right hand above left. Pull right arm DIAGONALLY (hip to shoulder) across chest. Bring right arm along head toward floor. Hold momentarily. Slowly return to starting position. Repeat _5-10__ times. Do with left arm. Band color _yellow_____   Shoulder Rotation: Double Arm   On back, knees bent, feet flat, elbows tucked at sides, bent 90, hands palms up. Pull hands apart and down toward floor, keeping elbows near sides. Hold momentarily. Slowly return to starting position. Repeat _5-10__ times. Band color __yellow____

## 2022-12-07 ENCOUNTER — Encounter: Payer: Self-pay | Admitting: Rehabilitation

## 2022-12-07 ENCOUNTER — Ambulatory Visit: Payer: No Typology Code available for payment source | Admitting: Rehabilitation

## 2022-12-07 DIAGNOSIS — M6281 Muscle weakness (generalized): Secondary | ICD-10-CM

## 2022-12-07 DIAGNOSIS — Z483 Aftercare following surgery for neoplasm: Secondary | ICD-10-CM

## 2022-12-07 DIAGNOSIS — R293 Abnormal posture: Secondary | ICD-10-CM

## 2022-12-07 DIAGNOSIS — N644 Mastodynia: Secondary | ICD-10-CM

## 2022-12-07 DIAGNOSIS — Z171 Estrogen receptor negative status [ER-]: Secondary | ICD-10-CM

## 2022-12-07 NOTE — Therapy (Signed)
OUTPATIENT PHYSICAL THERAPY  UPPER EXTREMITY ONCOLOGY TREATMENT  Patient Name: Marie Day MRN: [id number] DOB:07-Feb-1946, 77 y.o., female Today's Date: 12/07/2022  END OF SESSION:  PT End of Session - 12/07/22 0957     Visit Number 8    Number of Visits 9    Date for PT Re-Evaluation 12/20/22    PT Start Time 1000    PT Stop Time 1053    PT Time Calculation (min) 53 min    Activity Tolerance Patient tolerated treatment well    Behavior During Therapy WFL for tasks assessed/performed             Past Medical History:  Diagnosis Date   Arthritis    Breast cancer (HCC)    Cancer (HCC)    Past Surgical History:  Procedure Laterality Date   BREAST RECONSTRUCTION WITH PLACEMENT OF TISSUE EXPANDER AND FLEX HD (ACELLULAR HYDRATED DERMIS) Right 06/29/2021   Procedure: RIGHT BREAST RECONSTRUCTION WITH PLACEMENT OF TISSUE EXPANDER AND FLEX HD (ACELLULAR HYDRATED DERMIS);  Surgeon: Allena Napoleon, MD;  Location: [facility];  Service: Plastics;  Laterality: Right;   BREAST REDUCTION WITH MASTOPEXY Left 06/30/2022   Procedure: LEFT BREAST REDUCTION WITH MASTOPEXY;  Surgeon: Peggye Form, DO;  Location: [facility];  Service: Plastics;  Laterality: Left;   MASTECTOMY Right 06/29/2021   MASTECTOMY W/ SENTINEL NODE BIOPSY Right 06/29/2021   Procedure: RIGHT MASTECTOMY WITH SENTINEL LYMPH NODE BIOPSY;  Surgeon: Harriette Bouillon, MD;  Location: [facility];  Service: General;  Laterality: Right;   PORT-A-CATH REMOVAL Left 06/30/2022   Procedure: REMOVAL PORT-A-CATH;  Surgeon: Peggye Form, DO;  Location: [facility];  Service: Plastics;  Laterality: Left;   REMOVAL OF TISSUE EXPANDER AND PLACEMENT OF IMPLANT Right 06/30/2022   Procedure: REMOVAL OF TISSUE EXPANDER AND PLACEMENT OF IMPLANT;  Surgeon: Peggye Form, DO;  Location: [facility];  Service: Plastics;  Laterality: Right;   WISDOM TOOTH  EXTRACTION     Patient Active Problem List   Diagnosis Date Noted   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   Myalgia 02/08/2022   Breast asymmetry following reconstructive surgery 12/24/2021   S/P breast reconstruction 06/29/2021   Port-A-Cath in place 01/18/2021   Malignant neoplasm of upper-outer quadrant of right breast in female, estrogen receptor negative (HCC) 12/21/2020   Anemia 10/27/2020   Prediabetes 10/27/2020   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   Unspecified age-related cataract 12/14/2015    PCP: Quita Skye PA-C  REFERRING PROVIDER: Caroline More PA-C  REFERRING DIAG:  Diagnosis  Z98.890 (ICD-10-CM) - S/P breast reconstruction    THERAPY DIAG:  Aftercare following surgery for neoplasm  Malignant neoplasm of upper-outer quadrant of right breast in female, estrogen receptor negative (HCC)  Abnormal posture  Muscle weakness (generalized)  Breast pain  ONSET DATE: 06/29/21  Rationale for Evaluation and Treatment: Rehabilitation  SUBJECTIVE:  SUBJECTIVE STATEMENT:    Nothing new. I have been able to clean a bit more lately and am just feeling better overall  I still got help with my mopping from my daughter in law.    PERTINENT HISTORY: [name] with expander on 06/29/21 with 0/4 LN positive and switched to implants on 06/30/22. Completed radiation and chemo.  Left shoulder is not good.   PAIN:  Are you having pain? No, just occasional ache that doesn't limit activity  PRECAUTIONS: Rt lymphedema risk  WEIGHT BEARING RESTRICTIONS: No  FALLS:  Has patient fallen in last 6 months? No  LIVING ENVIRONMENT: Lives with: lives with their family and lives with their spouse  OCCUPATION: Retired  LEISURE: nothing currently - I  have silver sneakers but haven't gone   HAND DOMINANCE: right   PRIOR LEVEL OF FUNCTION: Independent  PATIENT GOALS: see if I can improve the pain   OBJECTIVE:  COGNITION: Overall cognitive status: Within functional limits for tasks assessed   PALPATION: +1 ttp Rt latissimus and pectoralis in stretched positions, fatty lipoma near drain site,   OBSERVATIONS / OTHER ASSESSMENTS: well healed, tightness evident in pectoralis, thinner skin axillary/lateral trunk area, one tight cord from lateral incision (not cording just scar)  POSTURE: stiff cervical spine   UPPER EXTREMITY AROM/PROM:  A/PROM RIGHT   Baseline  11/15/22 11/28/22  Shoulder extension 56 60   Shoulder flexion 160 150 - tight more like a pinch on the top of the shoulder 135- no pinch and pt with less compensation  Shoulder abduction 163 155 - tight pectoralis 163  Shoulder internal rotation     Shoulder external rotation 95 95     (Blank rows = not tested)  CERVICAL AROM: All within normal limits:   UPPER EXTREMITY STRENGTH: 4+/5 no pain Rt arm  QUICK DASH SURVEY: 50% from 37% on last visits     TODAY'S TREATMENT:                                                                                                                                          DATE:  12/07/22: Therapeutic Exs Pulleys into flexion and abd x 2 mins each  Roll yellow ball up wall into flexion x 10 Supine scapular series with yellow theraband x 10 each, all done 10 rep Manual Therapy P/ROM to Rt shoulder into flexion, scaption and D2 to pts tolerance  STM to Rt pectoralis insertion and lateral trunk during P/ROM  DATE:  12/05/22: Therapeutic Exs Pulleys into flexion and abd x 2 mins each  Roll yellow ball up wall into flexion x 10 and Rt UE abd x 10 Supine alternating flexion x 6, snow angel palm up x 6  Supine scapular series with yellow theraband x 10 each, all done 10 reps except bil D2 5 reps Manual Therapy P/ROM to Rt shoulder  into flexion, scaption and D2  to pts tolerance and with scapular depression by therapist throughout, limited scapular mobility noted STM to Rt pectoralis insertion and lateral trunk during P/ROM  11/30/22: Therapeutic Exs Pulleys into flexion and abd x 2 mins each with VC's to remind to decrease Rt scapular compensation Roll yellow ball up wall into flexion x 10 and Rt UE abd x 10 Supine with no roll today due to increased pain Bil UE horz abd x 10, then bil UE scaption into a "V" x 10 Manual Therapy P/ROM to Rt shoulder into flexion, scaption and D2 to pts tolerance and with scapular depression by therapist throughout, limited scapular mobility noted STM to Rt pectoralis insertion and lateral trunk during P/ROM  PATIENT EDUCATION:  Education details: POC, HEP Person educated: Patient Education method: Explanation, Demonstration, Actor cues, Verbal cues, and Handouts Education comprehension: verbalized understanding  HOME EXERCISE PROGRAM: Access Code: [email or web address] URL: [email or web address] Date: 11/15/2022 Prepared by: Gwenevere Abbot  Exercises - Supine Chest Stretch with Elbows Bent  - 1 x daily - 7 x weekly - 1 sets - 3 reps - 30-60seconds hold - Supine Pectoralis Stretch  - 1 x daily - 7 x weekly - 1-3 sets - 3 reps - 20-30 seconds hold - Single Arm Doorway Pec Stretch at 90 Degrees Abduction  - 1 x daily - 7 x weekly - 3 reps - 20-30 second hold - Seated Lateral Trunk Stretch on Swiss Ball  - 1 x daily - 7 x weekly - 1-3 sets - 3 reps - 5-6  seconds hold Supine scap series added 12/05/22  ASSESSMENT:  CLINICAL IMPRESSION:   OBJECTIVE IMPAIRMENTS: decreased knowledge of condition, decreased ROM, and increased fascial restrictions.   ACTIVITY LIMITATIONS: lifting and reach over head  PARTICIPATION LIMITATIONS: none  PERSONAL FACTORS: Time since onset of injury/illness/exacerbation and 1-2 comorbidities: radiation hx, SLNB  are also affecting patient's functional  outcome.   REHAB POTENTIAL: Excellent  CLINICAL DECISION MAKING: Stable/uncomplicated  EVALUATION COMPLEXITY: Low  GOALS: Goals reviewed with patient? Yes  SHORT TERM GOALS=LTGs: Target date: 12/20/22  Pt will improve ADL pain levels to 0/10 Baseline: 3/10 with reaching Goal status: INITIAL  2.  Pt will improve Rt shoulder AROM to baseline from last PT visits here Baseline:  Goal status: INITIAL  3.  Pt will be ind with final HEP Baseline:  Goal status: INITIAL   PLAN:  PT FREQUENCY: 1-2x/week  PT DURATION: 5 weeks   PLANNED INTERVENTIONS: Therapeutic exercises, Patient/Family education, Self Care, Manual therapy, and Re-evaluation  PLAN FOR NEXT SESSION: Cont AA/A/ROM exs, cont Rt upper quadrant PROM/MFR and STM, review supine scapular series and progress HEP prn  Mckenzi Buonomo, Julieanne Manson, PT 12/07/2022, 10:55 AM   Over Head Pull: Narrow and Wide Grip   Cancer Rehab [phone]   On back, knees bent, feet flat, band across thighs, elbows straight but relaxed. Pull hands apart (start). Keeping elbows straight, bring arms up and over head, hands toward floor. Keep pull steady on band. Hold momentarily. Return slowly, keeping pull steady, back to start. Then do same with a wider grip on the band (past shoulder width) Repeat _5-10__ times. Band color __yellow____   Side Pull: Double Arm   On back, knees bent, feet flat. Arms perpendicular to body, shoulder level, elbows straight but relaxed. Pull arms out to sides, elbows straight. Resistance band comes across collarbones, hands toward floor. Hold momentarily. Slowly return to starting position. Repeat _5-10__ times. Band color _yellow____   Sword   On back, knees bent,  feet flat, left hand on left hip, right hand above left. Pull right arm DIAGONALLY (hip to shoulder) across chest. Bring right arm along head toward floor. Hold momentarily. Slowly return to starting position. Repeat _5-10__ times. Do with left arm. Band color  _yellow_____   Shoulder Rotation: Double Arm   On back, knees bent, feet flat, elbows tucked at sides, bent 90, hands palms up. Pull hands apart and down toward floor, keeping elbows near sides. Hold momentarily. Slowly return to starting position. Repeat _5-10__ times. Band color __yellow____

## 2022-12-08 DIAGNOSIS — Z853 Personal history of malignant neoplasm of breast: Secondary | ICD-10-CM | POA: Diagnosis not present

## 2022-12-08 DIAGNOSIS — C50411 Malignant neoplasm of upper-outer quadrant of right female breast: Secondary | ICD-10-CM | POA: Diagnosis not present

## 2022-12-09 DIAGNOSIS — H5203 Hypermetropia, bilateral: Secondary | ICD-10-CM | POA: Diagnosis not present

## 2022-12-12 ENCOUNTER — Ambulatory Visit: Payer: No Typology Code available for payment source | Admitting: Rehabilitation

## 2022-12-12 ENCOUNTER — Encounter: Payer: No Typology Code available for payment source | Admitting: Rehabilitation

## 2022-12-14 ENCOUNTER — Encounter: Payer: No Typology Code available for payment source | Admitting: Rehabilitation

## 2022-12-19 ENCOUNTER — Encounter: Payer: Self-pay | Admitting: Rehabilitation

## 2022-12-19 ENCOUNTER — Ambulatory Visit: Payer: No Typology Code available for payment source | Admitting: Rehabilitation

## 2022-12-19 DIAGNOSIS — R293 Abnormal posture: Secondary | ICD-10-CM

## 2022-12-19 DIAGNOSIS — M6281 Muscle weakness (generalized): Secondary | ICD-10-CM

## 2022-12-19 DIAGNOSIS — Z483 Aftercare following surgery for neoplasm: Secondary | ICD-10-CM

## 2022-12-19 DIAGNOSIS — C50411 Malignant neoplasm of upper-outer quadrant of right female breast: Secondary | ICD-10-CM

## 2022-12-19 DIAGNOSIS — N644 Mastodynia: Secondary | ICD-10-CM

## 2022-12-19 NOTE — Therapy (Signed)
OUTPATIENT PHYSICAL THERAPY  UPPER EXTREMITY ONCOLOGY TREATMENT  Patient Name: Marie Day MRN: [id number] DOB:1945-08-07, 77 y.o., female Today's Date: 12/19/2022  END OF SESSION:  PT End of Session - 12/19/22 1053     Visit Number 9    Number of Visits 9    Date for PT Re-Evaluation 12/20/22    PT Start Time 1100    Activity Tolerance Patient tolerated treatment well    Behavior During Therapy [facility] for tasks assessed/performed             Past Medical History:  Diagnosis Date   Arthritis    Breast cancer (HCC)    Cancer (HCC)    Past Surgical History:  Procedure Laterality Date   BREAST RECONSTRUCTION WITH PLACEMENT OF TISSUE EXPANDER AND FLEX HD (ACELLULAR HYDRATED DERMIS) Right 06/29/2021   Procedure: RIGHT BREAST RECONSTRUCTION WITH PLACEMENT OF TISSUE EXPANDER AND FLEX HD (ACELLULAR HYDRATED DERMIS);  Surgeon: Allena Napoleon, MD;  Location: [facility];  Service: Plastics;  Laterality: Right;   BREAST REDUCTION WITH MASTOPEXY Left 06/30/2022   Procedure: LEFT BREAST REDUCTION WITH MASTOPEXY;  Surgeon: Peggye Form, DO;  Location: [facility];  Service: Plastics;  Laterality: Left;   MASTECTOMY Right 06/29/2021   MASTECTOMY W/ SENTINEL NODE BIOPSY Right 06/29/2021   Procedure: RIGHT MASTECTOMY WITH SENTINEL LYMPH NODE BIOPSY;  Surgeon: Harriette Bouillon, MD;  Location: [facility];  Service: General;  Laterality: Right;   PORT-A-CATH REMOVAL Left 06/30/2022   Procedure: REMOVAL PORT-A-CATH;  Surgeon: Peggye Form, DO;  Location: [facility];  Service: Plastics;  Laterality: Left;   REMOVAL OF TISSUE EXPANDER AND PLACEMENT OF IMPLANT Right 06/30/2022   Procedure: REMOVAL OF TISSUE EXPANDER AND PLACEMENT OF IMPLANT;  Surgeon: Peggye Form, DO;  Location: [facility];  Service: Plastics;  Laterality: Right;   WISDOM TOOTH EXTRACTION     Patient Active Problem List   Diagnosis Date  Noted   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   Myalgia 02/08/2022   Breast asymmetry following reconstructive surgery 12/24/2021   S/P breast reconstruction 06/29/2021   Port-A-Cath in place 01/18/2021   Malignant neoplasm of upper-outer quadrant of right breast in female, estrogen receptor negative (HCC) 12/21/2020   Anemia 10/27/2020   Prediabetes 10/27/2020   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   Unspecified age-related cataract 12/14/2015    PCP: Quita Skye PA-C  REFERRING PROVIDER: Caroline More PA-C  REFERRING DIAG:  Diagnosis  Z98.890 (ICD-10-CM) - S/P breast reconstruction    THERAPY DIAG:  Aftercare following surgery for neoplasm  Malignant neoplasm of upper-outer quadrant of right breast in female, estrogen receptor negative (HCC)  Abnormal posture  Muscle weakness (generalized)  Breast pain  ONSET DATE: 06/29/21  Rationale for Evaluation and Treatment: Rehabilitation  SUBJECTIVE:  SUBJECTIVE STATEMENT:   I am really feeling much better.    PERTINENT HISTORY: [name] with expander on 06/29/21 with 0/4 LN positive and switched to implants on 06/30/22. Completed radiation and chemo.  Left shoulder is not good.   PAIN:  Are you having pain? No, just occasional ache that doesn't limit activity  PRECAUTIONS: Rt lymphedema risk  WEIGHT BEARING RESTRICTIONS: No  FALLS:  Has patient fallen in last 6 months? No  LIVING ENVIRONMENT: Lives with: lives with their family and lives with their spouse  OCCUPATION: Retired  LEISURE: nothing currently - I have silver sneakers but haven't gone   HAND DOMINANCE: right   PRIOR LEVEL OF FUNCTION: Independent  PATIENT GOALS: see if I can improve the  pain   OBJECTIVE:  COGNITION: Overall cognitive status: Within functional limits for tasks assessed   PALPATION: +1 ttp Rt latissimus and pectoralis in stretched positions, fatty lipoma near drain site,   OBSERVATIONS / OTHER ASSESSMENTS: well healed, tightness evident in pectoralis, thinner skin axillary/lateral trunk area, one tight cord from lateral incision (not cording just scar)  POSTURE: stiff cervical spine   UPPER EXTREMITY AROM/PROM:  A/PROM RIGHT   Baseline  11/15/22 11/28/22 12/19/22  Shoulder extension 56 60    Shoulder flexion 160 150 - tight more like a pinch on the top of the shoulder 135- no pinch and pt with less compensation 150  Shoulder abduction 163 155 - tight pectoralis 163 163  Shoulder internal rotation      Shoulder external rotation 95 95      (Blank rows = not tested)  CERVICAL AROM: All within normal limits:   UPPER EXTREMITY STRENGTH: 4+/5 no pain Rt arm,   QUICK DASH SURVEY: 50% from 37% on last visits , 50% on 12/19/22 but pt reports its more from other issues   TODAY'S TREATMENT:                                                                                                                                          DATE:  12/19/22: Goal assessment, final HEP review, gym plan discussion x Therapeutic Exs Pulleys into flexion and abd x 2 mins each  Roll yellow ball up wall into flexion x 10 Supine scapular series with yellow theraband x 10 each Manual Therapy P/ROM to Rt shoulder into flexion, scaption and D2 to pts tolerance  STM to Rt pectoralis insertion and lateral trunk during P/ROM - clothed  12/07/22: Therapeutic Exs Pulleys into flexion and abd x 2 mins each  Roll yellow ball up wall into flexion x 10 Supine scapular series with yellow theraband x 10 each, all done 10 rep Manual Therapy P/ROM to Rt shoulder into flexion, scaption and D2 to pts tolerance  STM to Rt pectoralis insertion and lateral trunk during P/ROM  DATE:   12/05/22: Therapeutic Exs Pulleys into flexion and abd x  2 mins each  Roll yellow ball up wall into flexion x 10 and Rt UE abd x 10 Supine alternating flexion x 6, snow angel palm up x 6  Supine scapular series with yellow theraband x 10 each, all done 10 reps except bil D2 5 reps Manual Therapy P/ROM to Rt shoulder into flexion, scaption and D2 to pts tolerance and with scapular depression by therapist throughout, limited scapular mobility noted STM to Rt pectoralis insertion and lateral trunk during P/ROM  11/30/22: Therapeutic Exs Pulleys into flexion and abd x 2 mins each with VC's to remind to decrease Rt scapular compensation Roll yellow ball up wall into flexion x 10 and Rt UE abd x 10 Supine with no roll today due to increased pain Bil UE horz abd x 10, then bil UE scaption into a "V" x 10 Manual Therapy P/ROM to Rt shoulder into flexion, scaption and D2 to pts tolerance and with scapular depression by therapist throughout, limited scapular mobility noted STM to Rt pectoralis insertion and lateral trunk during P/ROM  PATIENT EDUCATION:  Education details: POC, HEP Person educated: Patient Education method: Explanation, Demonstration, Actor cues, Verbal cues, and Handouts Education comprehension: verbalized understanding  HOME EXERCISE PROGRAM: Access Code: [email or web address] URL: [email or web address] Date: 11/15/2022 Prepared by: Gwenevere Abbot  Exercises - Supine Chest Stretch with Elbows Bent  - 1 x daily - 7 x weekly - 1 sets - 3 reps - 30-60seconds hold - Supine Pectoralis Stretch  - 1 x daily - 7 x weekly - 1-3 sets - 3 reps - 20-30 seconds hold - Single Arm Doorway Pec Stretch at 90 Degrees Abduction  - 1 x daily - 7 x weekly - 3 reps - 20-30 second hold - Seated Lateral Trunk Stretch on Swiss Ball  - 1 x daily - 7 x weekly - 1-3 sets - 3 reps - 5-6  seconds hold Supine scap series added 12/05/22  ASSESSMENT:  CLINICAL IMPRESSION: Pt is ready for DC.  Goals  are met and she is ind with self care and HEP.  Will continue [facility] screenings.    OBJECTIVE IMPAIRMENTS: decreased knowledge of condition, decreased ROM, and increased fascial restrictions.   ACTIVITY LIMITATIONS: lifting and reach over head  PARTICIPATION LIMITATIONS: none  PERSONAL FACTORS: Time since onset of injury/illness/exacerbation and 1-2 comorbidities: radiation hx, SLNB  are also affecting patient's functional outcome.   REHAB POTENTIAL: Excellent  CLINICAL DECISION MAKING: Stable/uncomplicated  EVALUATION COMPLEXITY: Low  GOALS: Goals reviewed with patient? Yes  SHORT TERM GOALS=LTGs: Target date: 12/20/22  Pt will improve ADL pain levels to 0/10 Baseline: 3/10 with reaching Goal status: MET  2.  Pt will improve Rt shoulder AROM to baseline from last PT visits here Baseline:  Goal status: MET  3.  Pt will be ind with final HEP Baseline:  Goal status: MET   PLAN:  PT FREQUENCY: 1-2x/week  PT DURATION: 5 weeks   PLANNED INTERVENTIONS: Therapeutic exercises, Patient/Family education, Self Care, Manual therapy, and Re-evaluation  PLAN FOR NEXT SESSION: Cont AA/A/ROM exs, cont Rt upper quadrant PROM/MFR and STM, review supine scapular series and progress HEP prn  Ashiyah Pavlak, Julieanne Manson, PT 12/19/2022, 10:53 AM   Over Head Pull: Narrow and Wide Grip   Cancer Rehab [phone]   On back, knees bent, feet flat, band across thighs, elbows straight but relaxed. Pull hands apart (start). Keeping elbows straight, bring arms up and over head, hands toward floor. Keep pull steady on band. Hold momentarily. Return slowly, keeping pull  steady, back to start. Then do same with a wider grip on the band (past shoulder width) Repeat _5-10__ times. Band color __yellow____   Side Pull: Double Arm   On back, knees bent, feet flat. Arms perpendicular to body, shoulder level, elbows straight but relaxed. Pull arms out to sides, elbows straight. Resistance band comes across collarbones,  hands toward floor. Hold momentarily. Slowly return to starting position. Repeat _5-10__ times. Band color _yellow____   Sword   On back, knees bent, feet flat, left hand on left hip, right hand above left. Pull right arm DIAGONALLY (hip to shoulder) across chest. Bring right arm along head toward floor. Hold momentarily. Slowly return to starting position. Repeat _5-10__ times. Do with left arm. Band color _yellow_____   Shoulder Rotation: Double Arm   On back, knees bent, feet flat, elbows tucked at sides, bent 90, hands palms up. Pull hands apart and down toward floor, keeping elbows near sides. Hold momentarily. Slowly return to starting position. Repeat _5-10__ times. Band color __yellow____   PHYSICAL THERAPY DISCHARGE SUMMARY  Visits from Start of Care: 9  Current functional level related to goals / functional outcomes: See above   Remaining deficits: Lymphedema risk, chronic pain   Education / Equipment: Final HEP  Plan: Patient agrees to discharge.  Patient is being discharged due to meeting the stated rehab goals.

## 2022-12-21 ENCOUNTER — Encounter: Payer: Self-pay | Admitting: Family Medicine

## 2022-12-21 ENCOUNTER — Ambulatory Visit (INDEPENDENT_AMBULATORY_CARE_PROVIDER_SITE_OTHER): Payer: No Typology Code available for payment source | Admitting: Family Medicine

## 2022-12-21 ENCOUNTER — Ambulatory Visit: Payer: No Typology Code available for payment source | Admitting: Family Medicine

## 2022-12-21 VITALS — BP 135/79 | HR 73 | Temp 97.5°F | Ht 65.0 in | Wt 164.6 lb

## 2022-12-21 DIAGNOSIS — H9319 Tinnitus, unspecified ear: Secondary | ICD-10-CM | POA: Diagnosis not present

## 2022-12-21 DIAGNOSIS — R7303 Prediabetes: Secondary | ICD-10-CM | POA: Diagnosis not present

## 2022-12-21 DIAGNOSIS — M2550 Pain in unspecified joint: Secondary | ICD-10-CM | POA: Diagnosis not present

## 2022-12-21 DIAGNOSIS — C50919 Malignant neoplasm of unspecified site of unspecified female breast: Secondary | ICD-10-CM | POA: Insufficient documentation

## 2022-12-21 DIAGNOSIS — Z171 Estrogen receptor negative status [ER-]: Secondary | ICD-10-CM

## 2022-12-21 NOTE — Assessment & Plan Note (Signed)
This has been going on for multiple years at this point.Failed hearing screen today.  Will refer to audiology for consideration for hearing aids.  Also discussed white noise machine.

## 2022-12-21 NOTE — Patient Instructions (Signed)
It was very nice to see you today!  We will refer you to see an audiologist.  You have hearing loss.  Hearing aids may help with your tinnitus.  We will obtain records from your previous physicians.  Return in about 3 months (around 03/23/2023) for Annual Physical.   Take care, Dr Jimmey Ralph  PLEASE NOTE:  If you had any lab tests, please let us know if you have not heard back within a few days. You may see your results on mychart before we have a chance to review them but we will give you a call once they are reviewed by [place].   If we ordered any referrals today, please let us know if you have not heard from their office within the next week.   If you had any urgent prescriptions sent in today, please check with the pharmacy within an hour of our visit to make sure the prescription was transmitted appropriately.   Please try these tips to maintain a healthy lifestyle:  Eat at least 3 REAL meals and 1-2 snacks per day.  Aim for no more than 5 hours between eating.  If you eat breakfast, please do so within one hour of getting up.   Each meal should contain half fruits/vegetables, one quarter protein, and one quarter carbs (no bigger than a computer mouse)  Cut down on sweet beverages. This includes juice, soda, and sweet tea.   Drink at least 1 glass of water with each meal and aim for at least 8 glasses per day  Exercise at least 150 minutes every week.

## 2022-12-21 NOTE — Progress Notes (Signed)
Marie Day is a 77 y.o. female who presents today for an office visit.  She is a new patient.   Assessment/Plan:  Chronic Problems Addressed Today: Tinnitus This has been going on for multiple years at this point.Failed hearing screen today.  Will refer to audiology for consideration for hearing aids.  Also discussed white noise machine.  Polyarthralgia Improving with physical therapy.  Has underlying osteoarthritis.  Following with physiatry for this.  She does occasionally take ibuprofen and Tylenol as needed which works modestly well.  Prediabetes She will come back to check A1c in a few months at CPE.  Breast cancer (HCC) Completed treatment a year ago.  Following with oncology.  She has been in remission.  Preventative health care Obtain records from previous PCP.  She will come back in a few months for annual physical and we will update labs and screenings as needed at that point.    Subjective:  HPI:  See A/P for status of chronic conditions.  Patient is here to establish care.  She has been having ongoing issues with tinnitus for several years. Used to be intermittent but now persistent. Sometimes gets a high pitched sound but sometimes hears "cicadas." She will also sometimes hear a whooshin sound.   Symptoms have been stable the last 10 years. She have never sought care for this. No vertigo spinning. She does think that she has had some decreased hearing.   She has been following with the pain clinic for chronic joint pain. She has been working with physical therapy which is working well. She does take ibuprofen and tylenol occasionally which works modestly.   She was diagnosed with breast cancer a couple of years ago. She has completed her treatment about a year ago and has been in remission. She has been following with oncology for this.   ROS: Per HPI, otherwise a complete review of systems was negative.   PMH:  The following were reviewed and entered/updated  in epic: Past Medical History:  Diagnosis Date   Arthritis    Breast cancer (HCC)    Cancer [facility])    Patient Active Problem List   Diagnosis Date Noted   Tinnitus 12/21/2022   Breast cancer (HCC) 12/21/2022   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   Polyarthralgia 02/08/2022   S/P breast reconstruction 06/29/2021   Malignant neoplasm of upper-outer quadrant of right breast in female, estrogen receptor negative (HCC) 12/21/2020   Anemia 10/27/2020   Prediabetes 10/27/2020   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   Unspecified age-related cataract 12/14/2015   Past Surgical History:  Procedure Laterality Date   BREAST RECONSTRUCTION WITH PLACEMENT OF TISSUE EXPANDER AND FLEX HD (ACELLULAR HYDRATED DERMIS) Right 06/29/2021   Procedure: RIGHT BREAST RECONSTRUCTION WITH PLACEMENT OF TISSUE EXPANDER AND FLEX HD (ACELLULAR HYDRATED DERMIS);  Surgeon: Allena Napoleon, MD;  Location: [facility];  Service: Plastics;  Laterality: Right;   BREAST REDUCTION WITH MASTOPEXY Left 06/30/2022   Procedure: LEFT BREAST REDUCTION WITH MASTOPEXY;  Surgeon: Peggye Form, DO;  Location: [facility];  Service: Plastics;  Laterality: Left;   MASTECTOMY Right 06/29/2021   MASTECTOMY W/ SENTINEL NODE BIOPSY Right 06/29/2021   Procedure: RIGHT MASTECTOMY WITH SENTINEL LYMPH NODE BIOPSY;  Surgeon: Harriette Bouillon, MD;  Location: [facility];  Service: General;  Laterality: Right;   PORT-A-CATH REMOVAL Left 06/30/2022   Procedure: REMOVAL PORT-A-CATH;  Surgeon: Peggye Form, DO;  Location: [facility];  Service: Government social research officer;  Laterality: Left;   REMOVAL OF TISSUE EXPANDER AND PLACEMENT OF IMPLANT Right 06/30/2022   Procedure: REMOVAL OF TISSUE EXPANDER AND PLACEMENT OF IMPLANT;  Surgeon: Peggye Form, DO;  Location: [facility];  Service: Plastics;  Laterality: Right;   WISDOM TOOTH  EXTRACTION      Family History  Problem Relation Age of Onset   Hyperlipidemia Mother    Bone cancer Mother    Hearing loss Mother    Hearing loss Father    Hyperlipidemia Sister    Early death Brother    Birth defects Brother    Early death Maternal Grandmother    Pancreatic cancer Maternal Grandmother    Cancer Maternal Grandfather    Pancreatic cancer Paternal Grandmother    Cancer Paternal Grandfather    Colon cancer Neg Hx    Breast cancer Neg Hx     Medications- reviewed and updated Current Outpatient Medications  Medication Sig Dispense Refill   acetaminophen (TYLENOL 8 HOUR) 650 MG CR tablet 2 tablets as needed Orally every 8 hrs     aspirin EC 325 MG tablet Take 325 mg by mouth daily as needed.     Glucosamine-Chondroitin (OSTEO BI-FLEX REGULAR STRENGTH PO) Take by mouth in the morning and at bedtime.     Omega 3 1000 MG CAPS      Turmeric 500 MG CAPS      No current facility-administered medications for this visit.    Allergies-reviewed and updated Allergies  Allergen Reactions   Sulfa Antibiotics Hives and Other (See Comments)    Pt reports "foggy brain, Rashy/ hives, Red spots on Chest"    Social History   Socioeconomic History   Marital status: Married    Spouse name: Not on file   Number of children: Not on file   Years of education: Not on file   Highest education level: Not on file  Occupational History   Not on file  Tobacco Use   Smoking status: Never   Smokeless tobacco: Never  Vaping Use   Vaping status: Not on file  Substance and Sexual Activity   Alcohol use: Never   Drug use: Never   Sexual activity: Not on file  Other Topics Concern   Not on file  Social History Narrative   Not on file   Social Determinants of Health   Financial Resource Strain: Not on File (09/06/2022)   Received from [employer], [employer]    Financial Resource Strain: 0  Food Insecurity: Not at Risk (11/11/2022)   Received from [place],  [place]   Food Insecurity    Food: 1  Transportation Needs: Not at Risk (11/11/2022)   Received from [place], [employer] Needs    Transportation: 1  Physical Activity: Not on File (09/06/2022)   Received from [place], [place]   Physical Activity    Physical Activity: 0  Stress: Not on File (09/06/2022)   Received from [place], [place]   Stress    Stress: 0  Social Connections: Not on File (09/06/2022)   Received from [place], [facility]   Social Connections    Social Connections and Isolation: 0          Objective:  Physical Exam: BP 135/79   Pulse 73   Temp (!) 97.5 F (36.4 C) (Temporal)   Ht 5\' 5"  (1.651 m)   Wt 164 lb 9.6 oz (74.7 kg)   SpO2 96%   BMI 27.39  kg/m   Gen: No acute distress, resting comfortably CV: Regular rate and rhythm with no murmurs appreciated Pulm: Normal work of breathing, clear to auscultation bilaterally with no crackles, wheezes, or rhonchi Neuro: Grossly normal, moves all extremities Psych: Normal affect and thought content  Time Spent: 45 minutes of total time was spent on the date of the encounter performing the following actions: chart review prior to seeing the patient including previous visits with specialists, obtaining history, performing a medically necessary exam, counseling on the treatment plan, placing orders, and documenting in our EHR.        Katina Degree. Jimmey Ralph, MD 12/21/2022 9:30 AM

## 2022-12-21 NOTE — Assessment & Plan Note (Signed)
She will come back to check A1c in a few months at CPE.

## 2022-12-21 NOTE — Assessment & Plan Note (Signed)
Improving with physical therapy.  Has underlying osteoarthritis.  Following with physiatry for this.  She does occasionally take ibuprofen and Tylenol as needed which works modestly well.

## 2022-12-21 NOTE — Assessment & Plan Note (Signed)
Completed treatment a year ago.  Following with oncology.  She has been in remission.

## 2022-12-26 ENCOUNTER — Inpatient Hospital Stay
Payer: No Typology Code available for payment source | Attending: Hematology and Oncology | Admitting: Hematology and Oncology

## 2022-12-26 ENCOUNTER — Other Ambulatory Visit: Payer: Self-pay

## 2022-12-26 VITALS — BP 134/64 | HR 86 | Temp 97.5°F | Resp 18 | Ht 65.0 in | Wt 162.4 lb

## 2022-12-26 DIAGNOSIS — C50411 Malignant neoplasm of upper-outer quadrant of right female breast: Secondary | ICD-10-CM | POA: Insufficient documentation

## 2022-12-26 DIAGNOSIS — Z9221 Personal history of antineoplastic chemotherapy: Secondary | ICD-10-CM | POA: Insufficient documentation

## 2022-12-26 DIAGNOSIS — Z171 Estrogen receptor negative status [ER-]: Secondary | ICD-10-CM | POA: Diagnosis not present

## 2022-12-26 DIAGNOSIS — Z923 Personal history of irradiation: Secondary | ICD-10-CM | POA: Insufficient documentation

## 2022-12-26 DIAGNOSIS — Z9011 Acquired absence of right breast and nipple: Secondary | ICD-10-CM | POA: Insufficient documentation

## 2022-12-26 DIAGNOSIS — R222 Localized swelling, mass and lump, trunk: Secondary | ICD-10-CM | POA: Diagnosis not present

## 2022-12-26 DIAGNOSIS — Z79899 Other long term (current) drug therapy: Secondary | ICD-10-CM | POA: Diagnosis not present

## 2022-12-26 MED ORDER — ALENDRONATE SODIUM 70 MG PO TABS: 70.0000 mg | ORAL_TABLET | ORAL | 3 refills | Status: DC

## 2022-12-26 NOTE — Progress Notes (Signed)
Patient Care Team: Ardith Dark, MD as PCP - General (Family Medicine) Serena Croissant, MD as Consulting Physician (Hematology and Oncology) Dorothy Puffer, MD as Consulting Physician (Radiation Oncology) Harriette Bouillon, MD as Consulting Physician (General Surgery)  DIAGNOSIS:  Encounter Diagnosis  Name Primary?   Malignant neoplasm of upper-outer quadrant of right breast in female, estrogen receptor negative (HCC) Yes    SUMMARY OF ONCOLOGIC HISTORY: Oncology History  Malignant neoplasm of upper-outer quadrant of right breast in female, estrogen receptor negative (HCC)  11/17/2020 Initial Diagnosis   Work-up performed at [facility]: Palpable right breast mass.  Mammogram and ultrasound 11/10/2020: Spiculated 4.9 cm mass UOQ right breast with microcalcifications, biopsy revealed IDC ER/PR negative, HER2 positive 3+ by IHC, axillary lymph node positive (2 lymph nodes were detected)   11/20/2020 Genetic Testing   Negative. The Common Hereditary Gene Panel offered by [facility] includes sequencing and/or deletion duplication testing of the following 47 genes: APC, ATM, AXIN2, BARD1, BMPR1A, BRCA1, BRCA2, BRIP1, CDH1, CDK4, CDKN2A (p14ARF), CDKN2A (p16INK4a), CHEK2, CTNNA1, DICER1, EPCAM (Deletion/duplication testing only), GREM1 (promoter region deletion/duplication testing only), KIT, MEN1, MLH1, MSH2, MSH3, MSH6, MUTYH, NBN, NF1, NHTL1, PALB2, PDGFRA, PMS2, POLD1, POLE, PTEN, RAD50, RAD51C, RAD51D, SDHB, SDHC, SDHD, SMAD4, SMARCA4. STK11, TP53, TSC1, TSC2, and VHL.  The following genes were evaluated for sequence changes only: SDHA and HOXB13 c.251G>A variant only.    12/03/2020 PET scan   Right breast malignancy SUV 4.6, subcentimeter right level 1 and 2 axillary lymph nodes.  No distant metastatic disease.   12/21/2020 Cancer Staging   Staging form: Breast, AJCC 8th Edition - Clinical stage from 12/21/2020: Stage IIB (cT2, cN1, cM0, G3, ER-, PR-, HER2+) - Signed by Serena Croissant, MD on  12/21/2020 Stage prefix: Initial diagnosis Histologic grading system: 3 grade system   12/29/2020 - 12/21/2021 Chemotherapy   Patient is on Treatment Plan : BREAST  Docetaxel + Carboplatin + Trastuzumab + Pertuzumab  (TCHP) q21d      06/29/2021 Surgery   Right mastectomy: No residual cancer identified, 0/4 lymph nodes negative, complete pathologic response   08/27/2021 - 10/12/2021 Radiation Therapy   Site Technique Total Dose (Gy) Dose per Fx (Gy) Completed Fx Beam Energies  Chest Wall, Right: CW_R 3D 50.4/50.4 1.8 28/28 6X  Chest Wall, Right: CW_R_SCLV 3D 50.4/50.4 1.8 28/28 6X, 10X  Chest Wall, Right: CW_R_Bst Electron 10/10 2 5/5 6E       CHIEF COMPLIANT: Herceptin and Perjeta   INTERVAL HISTORY: Marie Day is a 77 y.o. with above-mentioned history of invasive ductal carcinoma of the right breast. She presents to the clinic today for a follow-up. Patient reports that she is doing well. She does have a nodule on chest.   ALLERGIES:  is allergic to sulfa antibiotics.  MEDICATIONS:  Current Outpatient Medications  Medication Sig Dispense Refill   acetaminophen (TYLENOL 8 HOUR) 650 MG CR tablet 2 tablets as needed Orally every 8 hrs     alendronate (FOSAMAX) 70 MG tablet Take 1 tablet (70 mg total) by mouth once a week. Take with a full glass of water on an empty stomach. 12 tablet 3   aspirin EC 325 MG tablet Take 325 mg by mouth daily as needed.     baclofen (LIORESAL) 10 MG tablet TAKE 1 TABLET BY MOUTH 3 (THREE) TIMES DAILY AS NEEDED FOR MUSCLE SPASMS FOR UP TO 28 DAYS     Glucosamine-Chondroitin (OSTEO BI-FLEX REGULAR STRENGTH PO) Take by mouth in the morning and at bedtime.  Omega 3 1000 MG CAPS      Turmeric 500 MG CAPS      No current facility-administered medications for this visit.    PHYSICAL EXAMINATION: ECOG PERFORMANCE STATUS: 1 - Symptomatic but completely ambulatory  Vitals:   12/26/22 1128  BP: 134/64  Pulse: 86  Resp: 18  Temp: (!) 97.5 F (36.4 C)   SpO2: 99%   Filed Weights   12/26/22 1128  Weight: 162 lb 6.4 oz (73.7 kg)    BREAST: No palpable masses or nodules in either right or left breasts. No palpable axillary supraclavicular or infraclavicular adenopathy no breast tenderness or nipple discharge. (exam performed in the presence of a chaperone)  LABORATORY DATA:  I have reviewed the data as listed    Latest Ref Rng & Units 12/21/2021   10:42 AM 11/09/2021   10:11 AM 09/28/2021   11:26 AM  CMP  Glucose 70 - 99 mg/dL 098  119  147   BUN 8 - 23 mg/dL 13  11  10    Creatinine 0.44 - 1.00 mg/dL 8.29  5.62  1.30   Sodium 135 - 145 mmol/L 141  141  141   Potassium 3.5 - 5.1 mmol/L 3.5  3.8  3.8   Chloride 98 - 111 mmol/L 106  106  105   CO2 22 - 32 mmol/L 26  29  26    Calcium 8.9 - 10.3 mg/dL 9.1  9.7  9.4   Total Protein 6.5 - 8.1 g/dL 6.8  6.8  6.9   Total Bilirubin 0.3 - 1.2 mg/dL 0.3  0.4  0.5   Alkaline Phos 38 - 126 U/L 120  102  93   AST 15 - 41 U/L 13  16  15    ALT 0 - 44 U/L 11  13  13      Lab Results  Component Value Date   WBC 6.2 12/21/2021   HGB 10.0 (L) 12/21/2021   HCT 30.7 (L) 12/21/2021   MCV 86.7 12/21/2021   PLT 305 12/21/2021   NEUTROABS 3.7 12/21/2021    ASSESSMENT & PLAN:  Malignant neoplasm of upper-outer quadrant of right breast in female, estrogen receptor negative (HCC) 11/17/2020:Work-up performed at [facility]: Palpable right breast mass.  Mammogram and ultrasound 11/10/2020: Spiculated 4.9 cm mass UOQ right breast with microcalcifications, biopsy revealed IDC ER/PR negative, HER2 positive 3+ by IHC, axillary lymph node positive (2 lymph nodes were detected)   Genetics were negative   Treatment Plan based on multidisciplinary tumor board: 1. Neoadjuvant chemotherapy with TCH Perjeta 6 cycles (started 12/29/2020-04/13/2021) followed by Herceptin Perjeta maintenance  for 1 year 2. 06/29/2021:Right mastectomy: No residual cancer identified, 0/4 lymph nodes negative, complete pathologic  response 3. Followed by adjuvant radiation therapy 08/27/2021-10/12/2021 ------------------------------------------------------------------------------------------------------------------------- Current treatment: Surveillance  Diffuse arthralgias and myalgias: The symptoms have come back since chemotherapy has been discontinued    Breast cancer surveillance:  Breast exam 12/26/2022: Benign  mammograms in the left breast 06/06/2022: Benign breast density category C Bone density 06/07/2022: T-score -2.9: Osteoporosis recommended calcium and vitamin D and bisphosphonate therapy She is going to [facility] for 3 to 4 weeks and is excited about that.   Return to clinic in 1 year for follow-up   Orders Placed This Encounter  Procedures   [place] LIMITED ULTRASOUND INCLUDING AXILLA RIGHT BREAST    Standing Status:   Future    Standing Expiration Date:   12/26/2023    Order Specific Question:   Reason for Exam (SYMPTOM  OR DIAGNOSIS REQUIRED)    Answer:   nodule on right breast    Order Specific Question:   Preferred imaging location?    Answer:   [facility]   The patient has a good understanding of the overall plan. she agrees with it. she will call with any problems that may develop before the next visit here. Total time spent: 30 mins including face to face time and time spent for planning, charting and co-ordination of care   Tamsen Meek, MD 12/26/22    I Janan Ridge am acting as a Neurosurgeon for [employer]  I have reviewed the above documentation for accuracy and completeness, and I agree with the above.

## 2022-12-26 NOTE — Assessment & Plan Note (Signed)
11/17/2020:Work-up performed at [facility]: Palpable right breast mass.  Mammogram and ultrasound 11/10/2020: Spiculated 4.9 cm mass UOQ right breast with microcalcifications, biopsy revealed IDC ER/PR negative, HER2 positive 3+ by IHC, axillary lymph node positive (2 lymph nodes were detected)   Genetics were negative   Treatment Plan based on multidisciplinary tumor board: 1. Neoadjuvant chemotherapy with TCH Perjeta 6 cycles (started 12/29/2020-04/13/2021) followed by Herceptin Perjeta maintenance  for 1 year 2. 06/29/2021:Right mastectomy: No residual cancer identified, 0/4 lymph nodes negative, complete pathologic response 3. Followed by adjuvant radiation therapy 08/27/2021-10/12/2021 ------------------------------------------------------------------------------------------------------------------------- Current treatment: Surveillance  Diffuse arthralgias and myalgias: The symptoms have come back since chemotherapy has been discontinued    Breast cancer surveillance:  Breast exam 12/26/2022: Benign  mammograms in the left breast 06/06/2022: Benign breast density category C Bone density 06/07/2022: T-score -2.9: Osteoporosis recommended calcium and vitamin D and bisphosphonate therapy She is going to [facility] for 3 to 4 weeks and is excited about that.   Return to clinic in 1 year for follow-up

## 2023-01-09 ENCOUNTER — Ambulatory Visit: Payer: No Typology Code available for payment source | Attending: Student | Admitting: Audiologist

## 2023-01-09 DIAGNOSIS — H903 Sensorineural hearing loss, bilateral: Secondary | ICD-10-CM | POA: Insufficient documentation

## 2023-01-09 DIAGNOSIS — H9313 Tinnitus, bilateral: Secondary | ICD-10-CM | POA: Insufficient documentation

## 2023-01-09 NOTE — Procedures (Signed)
  [facility] [address] [place], [place]  [address] [phone]  AUDIOLOGICAL  EVALUATION  NAME: Marie Day     DOB:   December 05, 1945      MRN: [id number]                                                                                     DATE: 01/09/2023     REFERENT: Ardith Dark, MD STATUS: Outpatient DIAGNOSIS: Sensorineural Hearing Loss, Tinnitus    History: Marie Day was seen for an audiological evaluation.  Marie Day is receiving a hearing evaluation due to concerns for constant woosh tinnitus in both her ears. Marie Day has has tinnitus her whole life, ever since she was a child. In the last year or so it has become constant and more bothersome. She hears it at al times. Marie Day has difficulty hearing people clearly, people sometimes sound like are talking in a foreign language. This difficulty began gradually. No pain or pressure reported in either ear. Tinnitus present in both ears. Marie Day had mumps and maybe measles as a child. No known history of chronic ear infections. Marie Day has difficulty equalizing the pressure in her ears with elevation change. No other relevant case history reported.   Evaluation:  Otoscopy showed a clear view of the tympanic membranes, bilaterally Tympanometry results were consistent with normal middle ear function, bilaterally   Audiometric testing was completed using conventional audiometry with supraural transducer. Speech Recognition Thresholds were 30dB in the right ear and 20dB in the left ear. Word Recognition was performed 70dB with masking, scored 100% % in the right ear and 100% in the left ear. 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   Results:  The test results were reviewed with Marie Day and she was given two copies of her audiogram. 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 Aids will also likely decrease her awareness  of the tinnitus. Marie Day is ready to try hearing aids.   Recommendations: Amplification is necessary for both ears. Hearing aids can be purchased from a variety of locations. See provided list for locations in the [place] area.    42 minutes spent testing and counseling on results.   Marie Day  Audiologist, Au.D., CCC-A 01/09/2023  1:02 PM  Cc: Ardith Dark, MD

## 2023-01-10 ENCOUNTER — Other Ambulatory Visit: Payer: No Typology Code available for payment source

## 2023-01-12 DIAGNOSIS — M810 Age-related osteoporosis without current pathological fracture: Secondary | ICD-10-CM | POA: Diagnosis not present

## 2023-01-12 DIAGNOSIS — Z6827 Body mass index (BMI) 27.0-27.9, adult: Secondary | ICD-10-CM | POA: Diagnosis not present

## 2023-01-12 DIAGNOSIS — H409 Unspecified glaucoma: Secondary | ICD-10-CM | POA: Diagnosis not present

## 2023-01-12 DIAGNOSIS — Z853 Personal history of malignant neoplasm of breast: Secondary | ICD-10-CM | POA: Diagnosis not present

## 2023-01-12 DIAGNOSIS — Z008 Encounter for other general examination: Secondary | ICD-10-CM | POA: Diagnosis not present

## 2023-01-12 DIAGNOSIS — E663 Overweight: Secondary | ICD-10-CM | POA: Diagnosis not present

## 2023-01-24 ENCOUNTER — Encounter: Payer: Self-pay | Admitting: Plastic Surgery

## 2023-01-24 ENCOUNTER — Ambulatory Visit (INDEPENDENT_AMBULATORY_CARE_PROVIDER_SITE_OTHER): Payer: No Typology Code available for payment source | Admitting: Plastic Surgery

## 2023-01-24 DIAGNOSIS — C50919 Malignant neoplasm of unspecified site of unspecified female breast: Secondary | ICD-10-CM

## 2023-01-24 DIAGNOSIS — C50411 Malignant neoplasm of upper-outer quadrant of right female breast: Secondary | ICD-10-CM | POA: Diagnosis not present

## 2023-01-24 DIAGNOSIS — Z171 Estrogen receptor negative status [ER-]: Secondary | ICD-10-CM | POA: Diagnosis not present

## 2023-01-24 NOTE — Progress Notes (Signed)
   Subjective:    Patient ID: Marie Day, female    DOB: 05-19-1946, 77 y.o.   MRN: [id number]  The patient is a 77 year old female here for follow-up on her breast surgery.  The patient was diagnosed with right breast cancer in 2022 and was treated by Dr. Luisa Hart with a right sided mastectomy and expander placement over the pectoralis muscle.  In March 2023 she had a total of 320 cc in the 375 cc expander and went on to have radiation which ended in May 2023.  She was happy with the size and would like better symmetry. February 2024 she underwent exchange of the right tissue expander to an implant with capsulectomies and capsulotomies.  She had a left breast mastopexy and removal of the Port-A-Cath.  Today she states that she is noticing some increased tenderness and a little lump on the breast of the right that is superior medial area and lateral inferior area.  I feel both areas and they lumps kind of flip underneath my fingers with pressure.  She has an MRI scheduled for tomorrow.  I have them marked in her picture.      Review of Systems  Constitutional: Negative.   HENT: Negative.    Eyes: Negative.   Respiratory: Negative.    Cardiovascular: Negative.   Gastrointestinal: Negative.   Endocrine: Negative.   Genitourinary: Negative.   Musculoskeletal: Negative.        Objective:   Physical Exam Constitutional:      Appearance: Normal appearance.  Cardiovascular:     Rate and Rhythm: Normal rate.     Pulses: Normal pulses.  Musculoskeletal:        General: Tenderness present. No swelling.       Arms:  Skin:    General: Skin is warm.     Capillary Refill: Capillary refill takes less than 2 seconds.     Coloration: Skin is not jaundiced.     Findings: Lesion present. No bruising.  Neurological:     Mental Status: She is alert and oriented to person, place, and time.  Psychiatric:        Mood and Affect: Mood normal.        Behavior: Behavior normal.        Thought  Content: Thought content normal.        Judgment: Judgment normal.        Assessment & Plan:     ICD-10-CM   1. Malignant neoplasm of upper-outer quadrant of right breast in female, estrogen receptor negative (HCC)  C50.411    Z17.1     2. Malignant neoplasm of female breast, unspecified estrogen receptor status, unspecified laterality, unspecified site of breast (HCC)  C50.919       Pictures were obtained of the patient and placed in the chart with the patient's or guardian's permission.  Will plan a televisit to discuss the results of the MRI after she gets them.  And we will also put in for a 1 year appointment.  Depending on what the MRI tells [place] will dictate what our options are for either removal of the 2 lesions or pain management.

## 2023-01-25 ENCOUNTER — Ambulatory Visit
Admission: RE | Admit: 2023-01-25 | Discharge: 2023-01-25 | Disposition: A | Discharge status: Home / Self Care | Payer: No Typology Code available for payment source | Source: Ambulatory Visit | Attending: Hematology and Oncology

## 2023-01-25 ENCOUNTER — Other Ambulatory Visit: Payer: Self-pay | Admitting: Hematology and Oncology

## 2023-01-25 DIAGNOSIS — Z171 Estrogen receptor negative status [ER-]: Secondary | ICD-10-CM

## 2023-01-25 DIAGNOSIS — N6312 Unspecified lump in the right breast, upper inner quadrant: Secondary | ICD-10-CM | POA: Diagnosis not present

## 2023-01-26 ENCOUNTER — Other Ambulatory Visit: Payer: Self-pay | Admitting: Hematology and Oncology

## 2023-01-26 DIAGNOSIS — N6310 Unspecified lump in the right breast, unspecified quadrant: Secondary | ICD-10-CM

## 2023-01-30 ENCOUNTER — Ambulatory Visit
Admission: RE | Admit: 2023-01-30 | Discharge: 2023-01-30 | Disposition: A | Discharge status: Home / Self Care | Payer: No Typology Code available for payment source | Source: Ambulatory Visit | Attending: Hematology and Oncology | Admitting: Hematology and Oncology

## 2023-01-30 ENCOUNTER — Encounter: Payer: Self-pay | Admitting: Physical Medicine and Rehabilitation

## 2023-01-30 ENCOUNTER — Encounter
Payer: No Typology Code available for payment source | Attending: Physical Medicine and Rehabilitation | Admitting: Physical Medicine and Rehabilitation

## 2023-01-30 VITALS — BP 103/71 | HR 76 | Ht 65.0 in | Wt 165.6 lb

## 2023-01-30 DIAGNOSIS — N641 Fat necrosis of breast: Secondary | ICD-10-CM | POA: Diagnosis not present

## 2023-01-30 DIAGNOSIS — N6310 Unspecified lump in the right breast, unspecified quadrant: Secondary | ICD-10-CM

## 2023-01-30 DIAGNOSIS — M25512 Pain in left shoulder: Secondary | ICD-10-CM | POA: Diagnosis not present

## 2023-01-30 DIAGNOSIS — G8929 Other chronic pain: Secondary | ICD-10-CM | POA: Diagnosis present

## 2023-01-30 DIAGNOSIS — Z87898 Personal history of other specified conditions: Secondary | ICD-10-CM | POA: Diagnosis not present

## 2023-01-30 DIAGNOSIS — N6312 Unspecified lump in the right breast, upper inner quadrant: Secondary | ICD-10-CM | POA: Diagnosis not present

## 2023-01-30 DIAGNOSIS — Z171 Estrogen receptor negative status [ER-]: Secondary | ICD-10-CM

## 2023-01-30 DIAGNOSIS — M7918 Myalgia, other site: Secondary | ICD-10-CM

## 2023-01-30 DIAGNOSIS — G894 Chronic pain syndrome: Secondary | ICD-10-CM | POA: Diagnosis not present

## 2023-01-30 DIAGNOSIS — G4701 Insomnia due to medical condition: Secondary | ICD-10-CM | POA: Diagnosis not present

## 2023-01-30 HISTORY — PX: BREAST BIOPSY: SHX20

## 2023-01-30 NOTE — Progress Notes (Signed)
Subjective:    Patient ID: Marie Day, female    DOB: 08/18/1945, 77 y.o.   MRN: [id number]  HPI Marie Day is a 77 year old woman who presents to establish care for pain.  1) Chronic pain: -in January she started to have pain all over the body -she is feeling much better -her pain has improved -chemo stopped her debilitating pain but it restarted afterward but not at the same level of severity -she takes aspirin or ibuprofen -mostly the pain is still present but at 1-2  2) Lump in breast: -within a month of finding this she had 5 procedures, and MRI, a blood test, a port -got chemo and radiation -has a biopsy today  3) Insomnia -die to pain  4) left shoulder pain:  -still feels sharp pain when she lists the left arm  Pain Inventory Average Pain 5 Pain Right Now 2 My pain is intermittent and sharp  In the last 24 hours, has pain interfered with the following? General activity 1 Relation with others 0 Enjoyment of life 1 What TIME of day is your pain at its worst? night Sleep (in general) Poor  Pain is worse with: walking, bending, sitting, and inactivity Pain improves with: therapy/exercise and medication Relief from Meds: 2  walk without assistance how many minutes can you walk? 30 ability to climb steps?  yes do you drive?  yes transfers alone Do you have any goals in this area?  yes  retired  bladder control problems tingling  none  New patient    Family History  Problem Relation Age of Onset   Hyperlipidemia Mother    Bone cancer Mother    Hearing loss Mother    Hearing loss Father    Hyperlipidemia Sister    Early death Brother    Birth defects Brother    Early death Maternal Grandmother    Pancreatic cancer Maternal Grandmother    Cancer Maternal Grandfather    Pancreatic cancer Paternal Grandmother    Cancer Paternal Grandfather    Depression Son    Colon cancer Neg Hx    Breast cancer Neg Hx    Social History    Socioeconomic History   Marital status: Married    Spouse name: Not on file   Number of children: Not on file   Years of education: Not on file   Highest education level: Not on file  Occupational History   Not on file  Tobacco Use   Smoking status: Never   Smokeless tobacco: Never  Vaping Use   Vaping status: Not on file  Substance and Sexual Activity   Alcohol use: Never   Drug use: Never   Sexual activity: Not on file  Other Topics Concern   Not on file  Social History Narrative   Not on file   Social Determinants of Health   Financial Resource Strain: Not on File (09/06/2022)   Received from [employer], [employer]    Financial Resource Strain: 0  Food Insecurity: Not at Risk (11/11/2022)   Received from [place], [place]   Food Insecurity    Food: 1  Transportation Needs: Not at Risk (11/11/2022)   Received from [place], [employer] Needs    Transportation: 1  Physical Activity: Not on File (09/06/2022)   Received from [place], [place]   Physical Activity    Physical Activity: 0  Stress: Not on File (09/06/2022)   Received from [place], [place]  Stress    Stress: 0  Social Connections: Not on File (01/24/2023)   Received from [employer]   Social Connections    Connectedness: 0   Past Surgical History:  Procedure Laterality Date   BREAST RECONSTRUCTION WITH PLACEMENT OF TISSUE EXPANDER AND FLEX HD (ACELLULAR HYDRATED DERMIS) Right 06/29/2021   Procedure: RIGHT BREAST RECONSTRUCTION WITH PLACEMENT OF TISSUE EXPANDER AND FLEX HD (ACELLULAR HYDRATED DERMIS);  Surgeon: Marie Napoleon, MD;  Location: [facility];  Service: Plastics;  Laterality: Right;   BREAST REDUCTION WITH MASTOPEXY Left 06/30/2022   Procedure: LEFT BREAST REDUCTION WITH MASTOPEXY;  Surgeon: Marie Form, DO;  Location: [facility];  Service: Plastics;  Laterality: Left;   MASTECTOMY Right 06/29/2021   MASTECTOMY W/ SENTINEL NODE BIOPSY Right  06/29/2021   Procedure: RIGHT MASTECTOMY WITH SENTINEL LYMPH NODE BIOPSY;  Surgeon: Marie Bouillon, MD;  Location: [facility];  Service: General;  Laterality: Right;   PORT-A-CATH REMOVAL Left 06/30/2022   Procedure: REMOVAL PORT-A-CATH;  Surgeon: Marie Form, DO;  Location: [facility];  Service: Plastics;  Laterality: Left;   REMOVAL OF TISSUE EXPANDER AND PLACEMENT OF IMPLANT Right 06/30/2022   Procedure: REMOVAL OF TISSUE EXPANDER AND PLACEMENT OF IMPLANT;  Surgeon: Marie Form, DO;  Location: [facility];  Service: Plastics;  Laterality: Right;   WISDOM TOOTH EXTRACTION     Past Medical History:  Diagnosis Date   Arthritis    Breast cancer (HCC)    Cancer (HCC)    There were no vitals taken for this visit.  Opioid Risk Score:   Fall Risk Score:  `1  Depression screen [facility] 2/9     12/21/2022    8:57 AM 11/29/2022   10:15 AM  Depression screen PHQ 2/9  Decreased Interest 0 1  Down, Depressed, Hopeless 0 0  PHQ - 2 Score 0 1  Altered sleeping  3  Tired, decreased energy  2  Feeling bad or failure about yourself   1  Trouble concentrating  0  Moving slowly or fidgety/restless  1  Suicidal thoughts  0  PHQ-9 Score  8      Review of Systems  Constitutional:        Night sweats weight gain  Gastrointestinal:  Positive for constipation and diarrhea.  Genitourinary:        Urine retention  All other systems reviewed and are negative.      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 Discussed that these lifestyle strategies are also very good for health unlike some medications which can have negative side effects. Discussed that the act of keeping a journal can be therapeutic and helpful to realize patterns what helps to trigger and alleviate pain.    -discussed trigger point injections  -discussed heat therapy  -discussed that this pain has improved  3) Insomnia: -discussed that this is still an issue for her -  discussed that her tinnitus contributed to this, discussed that she has had this her whole life.  -discussed topamax but we will try tart cherry juice first -Try to go outside near sunrise -Get exercise during the day.  -Turn off all devices an hour before bedtime.  -Teas that can benefit: chamomile, valerian root, Brahmi (Bacopa) -Can consider over the counter melatonin, magnesium, and/or L-theanine. Melatonin is an anti-oxidant with multiple health benefits. Magnesium is involved in greater than 300 enzymatic reactions in the body and most of [place] are deficient as our soil is often depleted. There are 7 different types of magnesium- Bioptemizer's is a supplement with all 7 types, and each has unique benefits. Magnesium can also help with constipation and anxiety.  -Pistachios naturally increase the production of melatonin -Cozy Earth bamboo bed sheets are free from toxic chemicals.  -Tart cherry juice or a tart cherry supplement can improve sleep and soreness post-workout  4) History of breast lump: -discussed that she has a biopsy today  5) Tinnitus: -discussed that she has had this for her whole life -discussed that when she was a toddler she banged her head -discussed that it is present bilaterally

## 2023-01-30 NOTE — Patient Instructions (Signed)
-  Try to go outside near sunrise -Get exercise during the day.  -Turn off all devices an hour before bedtime.  -Teas that can benefit: chamomile, valerian root, Brahmi (Bacopa) -Can consider over the counter melatonin, magnesium, and/or L-theanine. Melatonin is an anti-oxidant with multiple health benefits. Magnesium is involved in greater than 300 enzymatic reactions in the body and most of [place] are deficient as our soil is often depleted. There are 7 different types of magnesium- Bioptemizer's is a supplement with all 7 types, and each has unique benefits. Magnesium can also help with constipation and anxiety.  -Pistachios naturally increase the production of melatonin -Cozy Earth bamboo bed sheets are free from toxic chemicals.  -Tart cherry juice or a tart cherry supplement can improve sleep and soreness post-workout

## 2023-02-06 ENCOUNTER — Ambulatory Visit (INDEPENDENT_AMBULATORY_CARE_PROVIDER_SITE_OTHER): Payer: No Typology Code available for payment source | Admitting: Plastic Surgery

## 2023-02-06 DIAGNOSIS — Z9889 Other specified postprocedural states: Secondary | ICD-10-CM

## 2023-02-06 NOTE — Progress Notes (Signed)
Subjective:    Patient ID: Marie Day, female    DOB: Feb 28, 1946, 77 y.o.   MRN: [id number]  The patient is a 77 year old female joining me by phone to review the results of her biopsy.  Fortunately the biopsy was negative of her right breast this is very encouraging.  The patient does have some discomfort and it is likely fat necrosis.  She has decided that she would like to wait and try some massage.      Review of Systems     Objective:   Physical Exam      Assessment & Plan:     ICD-10-CM   1. S/P breast reconstruction  Z98.890       I connected with  Marie Day on 02/06/23 by phone and verified that I am speaking with the correct person using two identifiers.  The patient was at home and I was at the office.  We spent 5 minutes in discussion.   I discussed the limitations of evaluation and management by telemedicine. The patient expressed understanding and agreed to proceed.  The patient knows that if the discomfort does not go away that we could offer her options.  1 option would be Kenalog injection into the fat necrosis.  Another option would be for surgical removal.  The patient knows to come and see [place] in 1 year and sooner if needed.  No intervention at this time.

## 2023-02-07 ENCOUNTER — Telehealth: Payer: No Typology Code available for payment source | Admitting: Plastic Surgery

## 2023-02-28 ENCOUNTER — Ambulatory Visit
Admission: EM | Admit: 2023-02-28 | Discharge: 2023-02-28 | Disposition: A | Discharge status: Home / Self Care | Payer: No Typology Code available for payment source

## 2023-02-28 DIAGNOSIS — M79609 Pain in unspecified limb: Secondary | ICD-10-CM | POA: Insufficient documentation

## 2023-02-28 DIAGNOSIS — M2540 Effusion, unspecified joint: Secondary | ICD-10-CM | POA: Insufficient documentation

## 2023-02-28 DIAGNOSIS — R768 Other specified abnormal immunological findings in serum: Secondary | ICD-10-CM | POA: Insufficient documentation

## 2023-02-28 DIAGNOSIS — L03113 Cellulitis of right upper limb: Secondary | ICD-10-CM

## 2023-02-28 DIAGNOSIS — Z6824 Body mass index (BMI) 24.0-24.9, adult: Secondary | ICD-10-CM | POA: Insufficient documentation

## 2023-02-28 MED ORDER — DOXYCYCLINE HYCLATE 100 MG PO CAPS
100.0000 mg | ORAL_CAPSULE | Freq: Two times a day (BID) | ORAL | 0 refills | Status: DC
Start: 2023-02-28 — End: 2023-03-23

## 2023-02-28 MED ORDER — CEFTRIAXONE SODIUM 1 G IJ SOLR
1.0000 g | Freq: Once | INTRAMUSCULAR | Status: AC
  Administered 2023-02-28: 1 g via INTRAMUSCULAR

## 2023-02-28 NOTE — ED Provider Notes (Signed)
[facility]    CSN: [id number] Arrival date & time: 02/28/23  1506      History   Chief Complaint Chief Complaint  Patient presents with   Rash    HPI Marie Day is a 77 y.o. female.   Patient here today for evaluation of rash and redness with mild swelling to her right arm that started after a shower earlier this morning. She initially did not see any bites or anything to explain rash. She did notice a few bumps to her right upper arm in office today. She has not had fever. She denies any nausea or vomiting. She has not had any shortness of breath or trouble swallowing. She reports after her shower this morning she put on a sweater and it felt "prickly" in the area of erythema and this was instantaneous. She denies any itching. She does not report treatment for symptoms.  The history is provided by the patient.  Rash Associated symptoms: no abdominal pain, no fever, no nausea, no shortness of breath and not vomiting     Past Medical History:  Diagnosis Date   Arthritis    Breast cancer (HCC)    Cancer [facility])     Patient Active Problem List   Diagnosis Date Noted   Body mass index (BMI) 24.0-24.9, adult 02/28/2023   Joint swelling 02/28/2023   Other specified abnormal immunological findings in serum 02/28/2023   Pain in limb 02/28/2023   Tinnitus 12/21/2022   Breast cancer (HCC) 12/21/2022   Joint stiffness 09/01/2022   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   Polyarthralgia 02/08/2022   S/P breast reconstruction 06/29/2021   Malignant neoplasm of upper-outer quadrant of right breast in female, estrogen receptor negative (HCC) 12/21/2020   Anemia 10/27/2020   Prediabetes 10/27/2020   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   Unspecified age-related cataract 12/14/2015    Past Surgical History:  Procedure Laterality Date   BREAST BIOPSY Right 01/30/2023   [place] [name] BX W LOC DEV 1ST LESION IMG BX SPEC US GUIDE  01/30/2023 [facility] MAMMOGRAPHY   BREAST RECONSTRUCTION WITH PLACEMENT OF TISSUE EXPANDER AND FLEX HD (ACELLULAR HYDRATED DERMIS) Right 06/29/2021   Procedure: RIGHT BREAST RECONSTRUCTION WITH PLACEMENT OF TISSUE EXPANDER AND FLEX HD (ACELLULAR HYDRATED DERMIS);  Surgeon: Allena Napoleon, MD;  Location: [facility];  Service: Plastics;  Laterality: Right;   BREAST REDUCTION WITH MASTOPEXY Left 06/30/2022   Procedure: LEFT BREAST REDUCTION WITH MASTOPEXY;  Surgeon: Peggye Form, DO;  Location: [facility];  Service: Plastics;  Laterality: Left;   MASTECTOMY Right 06/29/2021   MASTECTOMY W/ SENTINEL NODE BIOPSY Right 06/29/2021   Procedure: RIGHT MASTECTOMY WITH SENTINEL LYMPH NODE BIOPSY;  Surgeon: Harriette Bouillon, MD;  Location: [facility];  Service: General;  Laterality: Right;   PORT-A-CATH REMOVAL Left 06/30/2022   Procedure: REMOVAL PORT-A-CATH;  Surgeon: Peggye Form, DO;  Location: [facility];  Service: Plastics;  Laterality: Left;   REMOVAL OF TISSUE EXPANDER AND PLACEMENT OF IMPLANT Right 06/30/2022   Procedure: REMOVAL OF TISSUE EXPANDER AND PLACEMENT OF IMPLANT;  Surgeon: Peggye Form, DO;  Location:  [facility];  Service: Plastics;  Laterality: Right;   WISDOM TOOTH EXTRACTION      OB History   No obstetric history on file.      Home Medications    Prior to Admission medications   Medication Sig Start Date End Date Taking? Authorizing Provider  doxycycline (VIBRAMYCIN) 100 MG capsule Take 1 capsule (100 mg total) by mouth 2 (two) times daily. 02/28/23  Yes Tomi Bamberger, PA-C  ibuprofen (ADVIL) 800 MG tablet Take 800 mg by mouth every 6 (six) hours as needed for fever, headache, mild pain, moderate pain or cramping.   Yes [provider]  acetaminophen (TYLENOL 8 HOUR) 650 MG CR tablet 2 tablets as needed Orally every 8 hrs    [provider]  alendronate (FOSAMAX) 70 MG  tablet Take 1 tablet (70 mg total) by mouth once a week. Take with a full glass of water on an empty stomach. 12/26/22   Serena Croissant, MD  aspirin EC 325 MG tablet Take 325 mg by mouth daily as needed. 12/06/21   [provider]  Boswellia-Glucosamine-Vit D (OSTEO BI-FLEX ONE PER DAY PO)     [provider]  Cholecalciferol (D3 HIGH POTENCY PO)     [provider]  Glucosamine-Chondroitin (OSTEO BI-FLEX REGULAR STRENGTH PO) Take by mouth in the morning and at bedtime.    [provider]  Omega 3 1000 MG CAPS     [provider]  Omega 3-6-9 Fatty Acids (OMEGA 3-6-9 PO)     [provider]  Turmeric 500 MG CAPS     [provider]    Family History Family History  Problem Relation Age of Onset   Hyperlipidemia Mother    Bone cancer Mother    Hearing loss Mother    Hearing loss Father    Hyperlipidemia Sister    Early death Brother    Birth defects Brother    Early death Maternal Grandmother    Pancreatic cancer Maternal Grandmother    Cancer Maternal Grandfather    Pancreatic cancer Paternal Grandmother    Cancer Paternal Grandfather    Depression Son    Colon cancer Neg Hx    Breast cancer Neg Hx     Social History Social History   Tobacco Use   Smoking status: Never   Smokeless tobacco: Never  Vaping Use   Vaping status: Never Used  Substance Use Topics   Alcohol use: Never   Drug use: Never     Allergies   Sulfa antibiotics   Review of Systems Review of Systems  Constitutional:  Negative for chills and fever.  HENT:  Negative for trouble swallowing.   Eyes:  Negative for discharge and redness.  Respiratory:  Negative for shortness of breath.   Gastrointestinal:  Negative for abdominal pain, nausea and vomiting.  Skin:  Positive for color change and rash.  Neurological:  Negative for numbness.     Physical Exam Triage Vital Signs ED Triage Vitals  Encounter Vitals Group     BP 02/28/23 1532  112/64     Systolic BP Percentile --      Diastolic BP Percentile --      Pulse Rate 02/28/23 1532 87     Resp 02/28/23 1532 18     Temp 02/28/23 1532 99.7 F (37.6 C)     Temp Source 02/28/23 1532 Oral     SpO2 02/28/23 1532 97 %     Weight 02/28/23 1530 165 lb 9.1 oz (75.1 kg)     Height 02/28/23 1530 5\' 5"  (1.651 m)     Head Circumference --      Peak Flow --      Pain Score 02/28/23 1528 0     Pain Loc --      Pain  Education --      Exclude from [employer] Chart --    No data found.  Updated Vital Signs BP 112/64 (BP Location: Left Arm)   Pulse 87   Temp 99.7 F (37.6 C) (Oral)   Resp 18   Ht 5\' 5"  (1.651 m)   Wt 165 lb 9.1 oz (75.1 kg)   SpO2 97%   BMI 27.55 kg/m   Visual Acuity Right Eye Distance:   Left Eye Distance:   Bilateral Distance:    Right Eye Near:   Left Eye Near:    Bilateral Near:     Physical Exam Vitals and nursing note reviewed.  Constitutional:      General: She is not in acute distress.    Appearance: Normal appearance. She is not ill-appearing.  HENT:     Head: Normocephalic and atraumatic.  Eyes:     Conjunctiva/sclera: Conjunctivae normal.  Cardiovascular:     Rate and Rhythm: Normal rate.  Pulmonary:     Effort: Pulmonary effort is normal. No respiratory distress.  Skin:         Comments: Area of erythema, relatively confluent, no rash passes wrist onto hand. Warm to touch, few papules to inner distal upper arm  Neurological:     Mental Status: She is alert.  Psychiatric:        Mood and Affect: Mood normal.        Behavior: Behavior normal.        Thought Content: Thought content normal.      UC Treatments / Results  Labs (all labs ordered are listed, but only abnormal results are displayed) Labs Reviewed - No data to display  EKG   Radiology No results found.  Procedures Procedures (including critical care time)  Medications Ordered in UC Medications  cefTRIAXone (ROCEPHIN) injection 1 g (1 g Intramuscular  Given 02/28/23 1548)    Initial Impression / Assessment and Plan / UC Course  I have reviewed the triage vital signs and the nursing notes.  Pertinent labs & imaging results that were available during my care of the patient were reviewed by me and considered in my medical decision making (see chart for details).    Unclear etiology  of symptoms. Will treat to cover cellulitis with antibiotics orally and rocephin injection in office. Area of erythema marked with skin marker with strict instruction to report to ED with any spread of erythema outside of marker border. Patient expresses understanding.   Final Clinical Impressions(s) / UC Diagnoses   Final diagnoses:  Cellulitis of right upper extremity   Discharge Instructions   None    ED Prescriptions     Medication Sig Dispense Auth. Provider   doxycycline (VIBRAMYCIN) 100 MG capsule Take 1 capsule (100 mg total) by mouth 2 (two) times daily. 20 capsule Tomi Bamberger, PA-C      PDMP not reviewed this encounter.   Tomi Bamberger, PA-C 02/28/23 1623

## 2023-02-28 NOTE — ED Triage Notes (Signed)
"  I am having a rash, redness, swelling on my right arm". "After the shower and putting my sweater on it, I first noticed it". "I went for a massage and the therapist noted it". "I didn't really feel good upon waking up". No ha "but feel's funny". "Arm is warm to touch and red".

## 2023-03-20 ENCOUNTER — Ambulatory Visit: Payer: No Typology Code available for payment source

## 2023-03-23 ENCOUNTER — Ambulatory Visit: Payer: No Typology Code available for payment source | Attending: Student

## 2023-03-23 ENCOUNTER — Ambulatory Visit (INDEPENDENT_AMBULATORY_CARE_PROVIDER_SITE_OTHER): Payer: No Typology Code available for payment source | Admitting: Family Medicine

## 2023-03-23 ENCOUNTER — Encounter: Payer: Self-pay | Admitting: Family Medicine

## 2023-03-23 VITALS — BP 124/74 | HR 75 | Temp 98.0°F | Ht 65.0 in | Wt 176.2 lb

## 2023-03-23 VITALS — Wt 171.0 lb

## 2023-03-23 DIAGNOSIS — C50919 Malignant neoplasm of unspecified site of unspecified female breast: Secondary | ICD-10-CM | POA: Diagnosis not present

## 2023-03-23 DIAGNOSIS — R7303 Prediabetes: Secondary | ICD-10-CM

## 2023-03-23 DIAGNOSIS — Z13220 Encounter for screening for lipoid disorders: Secondary | ICD-10-CM

## 2023-03-23 DIAGNOSIS — Z0001 Encounter for general adult medical examination with abnormal findings: Secondary | ICD-10-CM

## 2023-03-23 DIAGNOSIS — Z483 Aftercare following surgery for neoplasm: Secondary | ICD-10-CM | POA: Insufficient documentation

## 2023-03-23 LAB — LIPID PANEL
Cholesterol: 243 mg/dL — ABNORMAL HIGH (ref 0–200)
HDL: 62.6 mg/dL (ref >39.00)
LDL Cholesterol: 141 mg/dL — ABNORMAL HIGH (ref 0–99)
NonHDL: 180.25
Total CHOL/HDL Ratio: 4
Triglycerides: 195 mg/dL — ABNORMAL HIGH (ref 0.0–149.0)
VLDL: 39 mg/dL (ref 0.0–40.0)

## 2023-03-23 LAB — COMPREHENSIVE METABOLIC PANEL
ALT: 14 U/L (ref 0–35)
AST: 15 U/L (ref 0–37)
Albumin: 4.3 g/dL (ref 3.5–5.2)
Alkaline Phosphatase: 81 U/L (ref 39–117)
BUN: 10 mg/dL (ref 6–23)
CO2: 28 meq/L (ref 19–32)
Calcium: 9.4 mg/dL (ref 8.4–10.5)
Chloride: 104 meq/L (ref 96–112)
Creatinine, Ser: 0.66 mg/dL (ref 0.40–1.20)
GFR: 85.34 mL/min (ref >60.00)
Glucose, Bld: 93 mg/dL (ref 70–99)
Potassium: 4 meq/L (ref 3.5–5.1)
Sodium: 140 meq/L (ref 135–145)
Total Bilirubin: 0.4 mg/dL (ref 0.2–1.2)
Total Protein: 6.9 g/dL (ref 6.0–8.3)

## 2023-03-23 LAB — CBC
HCT: 36.2 % (ref 36.0–46.0)
Hemoglobin: 11.6 g/dL — ABNORMAL LOW (ref 12.0–15.0)
MCHC: 32.1 g/dL (ref 30.0–36.0)
MCV: 87.8 fL (ref 78.0–100.0)
Platelets: 300 10*3/uL (ref 150.0–400.0)
RBC: 4.12 Mil/uL (ref 3.87–5.11)
RDW: 14.5 % (ref 11.5–15.5)
WBC: 6.8 10*3/uL (ref 4.0–10.5)

## 2023-03-23 LAB — TSH: TSH: 1.87 u[IU]/mL (ref 0.35–5.50)

## 2023-03-23 LAB — HEMOGLOBIN A1C: Hgb A1c MFr Bld: 5.8 % (ref 4.6–6.5)

## 2023-03-23 NOTE — Assessment & Plan Note (Signed)
Check A1c. 

## 2023-03-23 NOTE — Progress Notes (Signed)
Chief Complaint:  Marie Day is a 77 y.o. female who presents today for her annual comprehensive physical exam.    Assessment/Plan:  Chronic Problems Addressed Today: Prediabetes Check A1c.   Breast cancer (HCC) In remission.  Continue management per oncology.  Preventative Healthcare: Check labs.  She will go to the pharmacy for vaccines.  Up-to-date on cancer screening.  Patient Counseling(The following topics were reviewed and/or handout was given):  -Nutrition: Stressed importance of moderation in sodium/caffeine intake, saturated fat and cholesterol, caloric balance, sufficient intake of fresh fruits, vegetables, and fiber.  -Stressed the importance of regular exercise.   -Substance Abuse: Discussed cessation/primary prevention of tobacco, alcohol, or other drug use; driving or other dangerous activities under the influence; availability of treatment for abuse.   -Injury prevention: Discussed safety belts, safety helmets, smoke detector, smoking near bedding or upholstery.   -Sexuality: Discussed sexually transmitted diseases, partner selection, use of condoms, avoidance of unintended pregnancy and contraceptive alternatives.   -Dental health: Discussed importance of regular tooth brushing, flossing, and dental visits.  -Health maintenance and immunizations reviewed. Please refer to Health maintenance section.  Return to care in 1 year for next preventative visit.     Subjective:  HPI:  She has no acute complaints today. Since our last visit she had an episode of cellulitis that was treated with a course of doxycycline. This has resolved. See assessment / plan for status of chronic conditions.   Lifestyle Diet: Balanced. Trying to get more fruits and vegetables.  Exercise: None.      01/30/2023   11:03 AM  Depression screen PHQ 2/9  Decreased Interest 0  Down, Depressed, Hopeless 0  PHQ - 2 Score 0    Health Maintenance Due  Topic Date Due   Medicare Annual  Wellness (AWV)  Never done   Zoster Vaccines- Shingrix (1 of 2) Never done   Pneumonia Vaccine 84+ Years old (2 of 2 - PPSV23 or PCV20) 02/25/2017   INFLUENZA VACCINE  12/22/2022   COVID-19 Vaccine (3 - 2023-24 season) 01/22/2023     ROS: Per HPI, otherwise a complete review of systems was negative.   PMH:  The following were reviewed and entered/updated in epic: Past Medical History:  Diagnosis Date   Arthritis    Breast cancer (HCC)    Cancer [facility])    Patient Active Problem List   Diagnosis Date Noted   Body mass index (BMI) 24.0-24.9, adult 02/28/2023   Joint swelling 02/28/2023   Other specified abnormal immunological findings in serum 02/28/2023   Pain in limb 02/28/2023   Tinnitus 12/21/2022   Breast cancer (HCC) 12/21/2022   Joint stiffness 09/01/2022   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   Polyarthralgia 02/08/2022   S/P breast reconstruction 06/29/2021   Malignant neoplasm of upper-outer quadrant of right breast in female, estrogen receptor negative (HCC) 12/21/2020   Anemia 10/27/2020   Prediabetes 10/27/2020   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   Unspecified age-related cataract 12/14/2015   Past Surgical History:  Procedure Laterality Date   BREAST BIOPSY Right 01/30/2023   [place] [name] BX W LOC DEV 1ST LESION IMG BX SPEC US GUIDE 01/30/2023 [facility]AMMOGRAPHY   BREAST RECONSTRUCTION WITH PLACEMENT OF TISSUE EXPANDER AND FLEX HD (ACELLULAR HYDRATED DERMIS) Right 06/29/2021   Procedure: RIGHT BREAST RECONSTRUCTION WITH PLACEMENT OF TISSUE EXPANDER AND FLEX HD (ACELLULAR HYDRATED DERMIS);  Surgeon: Allena Napoleon, MD;  Location: [facility];  Service: Plastics;  Laterality:  Right;   BREAST REDUCTION WITH MASTOPEXY Left 06/30/2022   Procedure: LEFT BREAST REDUCTION WITH MASTOPEXY;  Surgeon: Peggye Form, DO;  Location: [facility];  Service: Plastics;  Laterality: Left;   MASTECTOMY Right  06/29/2021   MASTECTOMY W/ SENTINEL NODE BIOPSY Right 06/29/2021   Procedure: RIGHT MASTECTOMY WITH SENTINEL LYMPH NODE BIOPSY;  Surgeon: Harriette Bouillon, MD;  Location: [facility];  Service: General;  Laterality: Right;   PORT-A-CATH REMOVAL Left 06/30/2022   Procedure: REMOVAL PORT-A-CATH;  Surgeon: Peggye Form, DO;  Location:  SUR[facility]ERY C[facility];  Service: Plastics;  Laterality: Left;   REMOVAL OF TISSUE EXPANDER AND PLACEMENT OF IMPLANT Right 06/30/2022   Procedure: REMOVAL OF TISSUE EXPANDER AND PLACEMENT OF IMPLANT;  Surgeon: Peggye Form, DO;  Location:  [facility];  Service: Plastics;  Laterality: Right;   WISDOM TOOTH EXTRACTION      Family History  Problem Relation Age of Onset   Hyperlipidemia Mother    Bone cancer Mother    Hearing loss Mother    Hearing loss Father    Hyperlipidemia Sister    Early death Brother    Birth defects Brother    Early death Maternal Grandmother    Pancreatic cancer Maternal Grandmother    Cancer Maternal Grandfather    Pancreatic cancer Paternal Grandmother    Cancer Paternal Grandfather    Depression Son    Colon cancer Neg Hx    Breast cancer Neg Hx     Medications- reviewed and updated Current Outpatient Medications  Medication Sig Dispense Refill   alendronate (FOSAMAX) 70 MG tablet Take 1 tablet (70 mg total) by mouth once a week. Take with a full glass of water on an empty stomach. 12 tablet 3   Boswellia-Glucosamine-Vit D (OSTEO BI-FLEX ONE PER DAY PO)      Cholecalciferol (D3 HIGH POTENCY PO)      Glucosamine-Chondroitin (OSTEO BI-FLEX REGULAR STRENGTH PO) Take by mouth in the morning and at bedtime.     ibuprofen (ADVIL) 800 MG tablet Take 800 mg by mouth every 6 (six) hours as needed for fever, headache, mild pain, moderate pain or cramping.     Omega 3 1000 MG CAPS      Turmeric 500 MG CAPS      acetaminophen (TYLENOL 8 HOUR) 650 MG CR tablet 2 tablets as needed Orally every 8  hrs (Patient not taking: Reported on 03/23/2023)     No current facility-administered medications for this visit.    Allergies-reviewed and updated Allergies  Allergen Reactions   Sulfa Antibiotics Hives and Other (See Comments)    Pt reports "foggy brain, Rashy/ hives, Red spots on Chest"    Social History   Socioeconomic History   Marital status: Married    Spouse name: Not on file   Number of children: Not on file   Years of education: Not on file   Highest education level: Not on file  Occupational History   Not on file  Tobacco Use   Smoking status: Never   Smokeless tobacco: Never  Vaping Use   Vaping status: Never Used  Substance and Sexual Activity   Alcohol use: Never   Drug use: Never   Sexual activity: Not Currently  Other Topics Concern   Not on file  Social History Narrative   Not on file   Social Determinants of Health   Financial Resource Strain: Not on File (09/06/2022)   Received from [place], [place]  Dance movement psychotherapist Strain: 0  Food Insecurity: Not at Risk (11/11/2022)   Received from [place], [place]   Food Insecurity    Food: 1  Transportation Needs: Not at Risk (11/11/2022)   Received from [place], [employer] Needs    Transportation: 1  Physical Activity: Not on File (09/06/2022)   Received from [place], [place]   Physical Activity    Physical Activity: 0  Stress: Not on File (09/06/2022)   Received from [facility] [place]   Stress    Stress: 0  Social Connections: Not on File (01/24/2023)   Received from [employer]    Connectedness: 0        Objective:  Physical Exam: BP 124/74   Pulse 75   Temp 98 F (36.7 C) (Temporal)   Ht 5\' 5"  (1.651 m)   Wt 176 lb 3.2 oz (79.9 kg)   SpO2 97%   BMI 29.32 kg/m   Body mass index is 29.32 kg/m. Wt Readings from Last 3 Encounters:  03/23/23 176 lb 3.2 oz (79.9 kg)  03/23/23 171 lb (77.6 kg)  02/28/23 165 lb 9.1 oz (75.1 kg)   Gen: NAD, resting  comfortably HEENT: TMs normal bilaterally. OP clear. No thyromegaly noted.  CV: RRR with no murmurs appreciated Pulm: NWOB, CTAB with no crackles, wheezes, or rhonchi GI: Normal bowel sounds present. Soft, Nontender, Nondistended. MSK: no edema, cyanosis, or clubbing noted Skin: warm, dry Neuro: CN2-12 grossly intact. Strength 5/5 in upper and lower extremities. Reflexes symmetric and intact bilaterally.  Psych: Normal affect and thought content     Latecia Miler M. Jimmey Ralph, MD 03/23/2023 11:51 AM

## 2023-03-23 NOTE — Assessment & Plan Note (Signed)
In remission.  Continue management per oncology.

## 2023-03-23 NOTE — Therapy (Signed)
OUTPATIENT PHYSICAL THERAPY SOZO SCREENING NOTE   Patient Name: Marie Day MRN: [id number] DOB:09-Nov-1945, 77 y.o., female Today's Date: 03/23/2023  PCP: Ardith Dark, MD REFERRING PROVIDER: Laurena Spies, PA-C   PT End of Session - 03/23/23 1002     Visit Number 9   # unchanged due to screen only   PT Start Time 0959    PT Stop Time 1003    PT Time Calculation (min) 4 min    Activity Tolerance Patient tolerated treatment well    Behavior During Therapy [facility] for tasks assessed/performed             Past Medical History:  Diagnosis Date   Arthritis    Breast cancer (HCC)    Cancer (HCC)    Past Surgical History:  Procedure Laterality Date   BREAST BIOPSY Right 01/30/2023   [place] [name] BX W LOC DEV 1ST LESION IMG BX SPEC US GUIDE 01/30/2023 [facility] MAMMOGRAPHY   BREAST RECONSTRUCTION WITH PLACEMENT OF TISSUE EXPANDER AND FLEX HD (ACELLULAR HYDRATED DERMIS) Right 06/29/2021   Procedure: RIGHT BREAST RECONSTRUCTION WITH PLACEMENT OF TISSUE EXPANDER AND FLEX HD (ACELLULAR HYDRATED DERMIS);  Surgeon: Allena Napoleon, MD;  Location: [facility];  Service: Plastics;  Laterality: Right;   BREAST REDUCTION WITH MASTOPEXY Left 06/30/2022   Procedure: LEFT BREAST REDUCTION WITH MASTOPEXY;  Surgeon: Peggye Form, DO;  Location: [facility];  Service: Plastics;  Laterality: Left;   MASTECTOMY Right 06/29/2021   MASTECTOMY W/ SENTINEL NODE BIOPSY Right 06/29/2021   Procedure: RIGHT MASTECTOMY WITH SENTINEL LYMPH NODE BIOPSY;  Surgeon: Harriette Bouillon, MD;  Location: [facility];  Service: General;  Laterality: Right;   PORT-A-CATH REMOVAL Left 06/30/2022   Procedure: REMOVAL PORT-A-CATH;  Surgeon: Peggye Form, DO;  Location: [facility];  Service: Plastics;  Laterality: Left;   REMOVAL OF TISSUE EXPANDER AND PLACEMENT OF IMPLANT Right 06/30/2022   Procedure: REMOVAL OF TISSUE EXPANDER AND PLACEMENT OF IMPLANT;   Surgeon: Peggye Form, DO;  Location:  SUR[facility]ERY CENTER;  Service: Plastics;  Laterality: Right;   WISDOM TOOTH EXTRACTION     Patient Active Problem List   Diagnosis Date Noted   Body mass index (BMI) 24.0-24.9, adult 02/28/2023   Joint swelling 02/28/2023   Other specified abnormal immunological findings in serum 02/28/2023   Pain in limb 02/28/2023   Tinnitus 12/21/2022   Breast cancer (HCC) 12/21/2022   Joint stiffness 09/01/2022   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   Polyarthralgia 02/08/2022   S/P breast reconstruction 06/29/2021   Malignant neoplasm of upper-outer quadrant of right breast in female, estrogen receptor negative (HCC) 12/21/2020   Anemia 10/27/2020   Prediabetes 10/27/2020   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 Completed radiation and chemo.  Left shoulder is not good   PRECAUTIONS: right UE Lymphedema risk, None  SUBJECTIVE: Pt returns for her 3 month L-Dex screen. "I had cellulitis about a month ago."  PAIN:  Are you having pain? No  SOZO SCREENING: Patient was assessed today using the SOZO machine to determine the lymphedema index score. This was compared to her baseline score. It was determined that she is within the recommended  range when compared to her baseline and no further action is needed at this time. She will continue SOZO screenings. These are done every 3 months for 2 years post operatively followed by every 6 months for 2 years, and then annually.   L-DEX FLOWSHEETS - 03/23/23 1000       L-DEX LYMPHEDEMA SCREENING   Measurement Type Unilateral    L-DEX MEASUREMENT EXTREMITY Upper Extremity    POSITION   Standing    DOMINANT SIDE Right    At Risk Side Right    BASELINE SCORE (UNILATERAL) -3.8    L-DEX SCORE (UNILATERAL) 2.3    VALUE CHANGE (UNILAT) 6.1            P:Pt to return  in 1 month for reassess since she had recent bout of cellulitis and her change from baseline was still WNLs but near threshold of subclinical lymphedema.    Hermenia Bers, PTA 03/23/2023, 10:03 AM

## 2023-03-23 NOTE — Patient Instructions (Signed)
It was very nice to see you today!  We will check blood work today.  Keep working on diet and exercise.   We will see you back in a year for your next physical.   Return in about 1 year (around 03/22/2024) for Annual Physical.   Take care, Dr Jimmey Ralph  PLEASE NOTE:  If you had any lab tests, please let us know if you have not heard back within a few days. You may see your results on mychart before we have a chance to review them but we will give you a call once they are reviewed by [place].   If we ordered any referrals today, please let us know if you have not heard from their office within the next week.   If you had any urgent prescriptions sent in today, please check with the pharmacy within an hour of our visit to make sure the prescription was transmitted appropriately.   Please try these tips to maintain a healthy lifestyle:  Eat at least 3 REAL meals and 1-2 snacks per day.  Aim for no more than 5 hours between eating.  If you eat breakfast, please do so within one hour of getting up.   Each meal should contain half fruits/vegetables, one quarter protein, and one quarter carbs (no bigger than a computer mouse)  Cut down on sweet beverages. This includes juice, soda, and sweet tea.   Drink at least 1 glass of water with each meal and aim for at least 8 glasses per day  Exercise at least 150 minutes every week.    Preventive Care 35 Years and Older, Female Preventive care refers to lifestyle choices and visits with your health care provider that can promote health and wellness. Preventive care visits are also called wellness exams. What can I expect for my preventive care visit? Counseling Your health care provider may ask you questions about your: Medical history, including: Past medical problems. Family medical history. Pregnancy and menstrual history. History of falls. Current health, including: Memory and ability to understand (cognition). Emotional well-being. Home  life and relationship well-being. Sexual activity and sexual health. Lifestyle, including: Alcohol, nicotine or tobacco, and drug use. Access to firearms. Diet, exercise, and sleep habits. Work and work Astronomer. Sunscreen use. Safety issues such as seatbelt and bike helmet use. Physical exam Your health care provider will check your: Height and weight. These may be used to calculate your BMI (body mass index). BMI is a measurement that tells if you are at a healthy weight. Waist circumference. This measures the distance around your waistline. This measurement also tells if you are at a healthy weight and may help predict your risk of certain diseases, such as type 2 diabetes and high blood pressure. Heart rate and blood pressure. Body temperature. Skin for abnormal spots. What immunizations do I need?  Vaccines are usually given at various ages, according to a schedule. Your health care provider will recommend vaccines for you based on your age, medical history, and lifestyle or other factors, such as travel or where you work. What tests do I need? Screening Your health care provider may recommend screening tests for certain conditions. This may include: Lipid and cholesterol levels. Hepatitis C test. Hepatitis B test. HIV (human immunodeficiency virus) test. STI (sexually transmitted infection) testing, if you are at risk. Lung cancer screening. Colorectal cancer screening. Diabetes screening. This is done by checking your blood sugar (glucose) after you have not eaten for a while (fasting). Mammogram. Talk with  your health care provider about how often you should have regular mammograms. BRCA-related cancer screening. This may be done if you have a family history of breast, ovarian, tubal, or peritoneal cancers. Bone density scan. This is done to screen for osteoporosis. Talk with your health care provider about your test results, treatment options, and if necessary, the need  for more tests. Follow these instructions at home: Eating and drinking  Eat a diet that includes fresh fruits and vegetables, whole grains, lean protein, and low-fat dairy products. Limit your intake of foods with high amounts of sugar, saturated fats, and salt. Take vitamin and mineral supplements as recommended by your health care provider. Do not drink alcohol if your health care provider tells you not to drink. If you drink alcohol: Limit how much you have to 0-1 drink a day. Know how much alcohol is in your drink. In the [place] one drink equals one 12 oz bottle of beer (355 mL), one 5 oz glass of wine (148 mL), or one 1 oz glass of hard liquor (44 mL). Lifestyle Brush your teeth every morning and night with fluoride toothpaste. Floss one time each day. Exercise for at least 30 minutes 5 or more days each week. Do not use any products that contain nicotine or tobacco. These products include cigarettes, chewing tobacco, and vaping devices, such as e-cigarettes. If you need help quitting, ask your health care provider. Do not use drugs. If you are sexually active, practice safe sex. Use a condom or other form of protection in order to prevent STIs. Take aspirin only as told by your health care provider. Make sure that you understand how much to take and what form to take. Work with your health care provider to find out whether it is safe and beneficial for you to take aspirin daily. Ask your health care provider if you need to take a cholesterol-lowering medicine (statin). Find healthy ways to manage stress, such as: Meditation, yoga, or listening to music. Journaling. Talking to a trusted person. Spending time with friends and family. Minimize exposure to UV radiation to reduce your risk of skin cancer. Safety Always wear your seat belt while driving or riding in a vehicle. Do not drive: If you have been drinking alcohol. Do not ride with someone who has been drinking. When you are  tired or distracted. While texting. If you have been using any mind-altering substances or drugs. Wear a helmet and other protective equipment during sports activities. If you have firearms in your house, make sure you follow all gun safety procedures. What's next? Visit your health care provider once a year for an annual wellness visit. Ask your health care provider how often you should have your eyes and teeth checked. Stay up to date on all vaccines. This information is not intended to replace advice given to you by your health care provider. Make sure you discuss any questions you have with your health care provider. Document Revised: 11/04/2020 Document Reviewed: 11/04/2020 Elsevier Patient Education  2024 [employer].

## 2023-03-24 ENCOUNTER — Encounter: Payer: Self-pay | Admitting: Family Medicine

## 2023-03-24 DIAGNOSIS — E785 Hyperlipidemia, unspecified: Secondary | ICD-10-CM | POA: Insufficient documentation

## 2023-03-24 NOTE — Progress Notes (Signed)
Cholesterol is elevated.  She would benefit from starting statin to lower her numbers and reduce risk of heart attack and stroke.  The rest of her labs are all stable.  Please send in Lipitor 40 mg daily if she is agreeable to start.  Regardless, she should continue to work on diet and exercise and we can recheck everything in a year or so.

## 2023-04-04 DIAGNOSIS — H903 Sensorineural hearing loss, bilateral: Secondary | ICD-10-CM | POA: Diagnosis not present

## 2023-04-26 ENCOUNTER — Ambulatory Visit: Payer: No Typology Code available for payment source | Attending: Student

## 2023-04-26 VITALS — Wt 174.4 lb

## 2023-04-26 DIAGNOSIS — Z483 Aftercare following surgery for neoplasm: Secondary | ICD-10-CM | POA: Insufficient documentation

## 2023-04-26 NOTE — Therapy (Signed)
OUTPATIENT PHYSICAL THERAPY SOZO SCREENING NOTE   Patient Name: Marie Day MRN: [id number] DOB:07-Jan-1946, 77 y.o., female Today's Date: 04/26/2023  PCP: Ardith Dark, MD REFERRING PROVIDER: Laurena Spies, PA-C   PT End of Session - 04/26/23 1208     Visit Number 9   # unchanged due to screne only   PT Start Time 1204    PT Stop Time 1210    PT Time Calculation (min) 6 min    Activity Tolerance Patient tolerated treatment well    Behavior During Therapy [facility] for tasks assessed/performed             Past Medical History:  Diagnosis Date   Arthritis    Breast cancer (HCC)    Cancer (HCC)    Past Surgical History:  Procedure Laterality Date   BREAST BIOPSY Right 01/30/2023   [place] [name] BX W LOC DEV 1ST LESION IMG BX SPEC US GUIDE 01/30/2023 [facility]MOGRAP[facility]   BREAST RECONSTRUCTION WITH PLACEMENT OF TISSUE EXPANDER AND FLEX HD (ACELLULAR HYDRATED DERMIS) Right 06/29/2021   Procedure: RIGHT BREAST RECONSTRUCTION WITH PLACEMENT OF TISSUE EXPANDER AND FLEX HD (ACELLULAR HYDRATED DERMIS);  Surgeon: Allena Napoleon, MD;  Location: [facility];  Service: Plastics;  Laterality: Right;   BREAST REDUCTION WITH MASTOPEXY Left 06/30/2022   Procedure: LEFT BREAST REDUCTION WITH MASTOPEXY;  Surgeon: Peggye Form, DO;  Location: [facility];  Service: Plastics;  Laterality: Left;   MASTECTOMY Right 06/29/2021   MASTECTOMY W/ SENTINEL NODE BIOPSY Right 06/29/2021   Procedure: RIGHT MASTECTOMY WITH SENTINEL LYMPH NODE BIOPSY;  Surgeon: Harriette Bouillon, MD;  Location: [facility];  Service: General;  Laterality: Right;   PORT-A-CATH REMOVAL Left 06/30/2022   Procedure: REMOVAL PORT-A-CATH;  Surgeon: Peggye Form, DO;  Location: [facility];  Service: Plastics;  Laterality: Left;   REMOVAL OF TISSUE EXPANDER AND PLACEMENT OF IMPLANT Right 06/30/2022   Procedure: REMOVAL OF TISSUE EXPANDER AND PLACEMENT OF IMPLANT;   Surgeon: Peggye Form, DO;  Location:  [facility];  Service: Plastics;  Laterality: Right;   WISDOM TOOTH EXTRACTION     Patient Active Problem List   Diagnosis Date Noted   Dyslipidemia 03/24/2023   Body mass index (BMI) 24.0-24.9, adult 02/28/2023   Joint swelling 02/28/2023   Other specified abnormal immunological findings in serum 02/28/2023   Pain in limb 02/28/2023   Tinnitus 12/21/2022   Breast cancer (HCC) 12/21/2022   Joint stiffness 09/01/2022   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   Polyarthralgia 02/08/2022   S/P breast reconstruction 06/29/2021   Malignant neoplasm of upper-outer quadrant of right breast in female, estrogen receptor negative (HCC) 12/21/2020   Anemia 10/27/2020   Prediabetes 10/27/2020   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 Completed radiation and chemo.  Left shoulder is not good   PRECAUTIONS: right UE Lymphedema risk, None  SUBJECTIVE: Pt returns for her another 1 month L-Dex screen for reassess after bout of cellulitis.   PAIN:  Are you having pain? No  SOZO SCREENING: Patient was assessed today using the SOZO machine to determine the lymphedema index score. This was compared to her baseline score. It was determined that  she is within the recommended range when compared to her baseline and no further action is needed at this time. She will continue SOZO screenings. These are done every 3 months for 2 years post operatively followed by every 6 months for 2 years, and then annually.   L-DEX FLOWSHEETS - 04/26/23 1200       L-DEX LYMPHEDEMA SCREENING   Measurement Type Unilateral    L-DEX MEASUREMENT  EXTREMITY Upper Extremity    POSITION  Standing    DOMINANT SIDE Right    At Risk Side Right    BASELINE SCORE (UNILATERAL) -3.8    L-DEX SCORE (UNILATERAL) -8.5    VALUE CHANGE (UNILAT) -4.7              Hermenia Bers, PTA 04/26/2023, 12:10 PM

## 2023-06-12 DIAGNOSIS — Z853 Personal history of malignant neoplasm of breast: Secondary | ICD-10-CM | POA: Diagnosis not present

## 2023-06-12 DIAGNOSIS — C50411 Malignant neoplasm of upper-outer quadrant of right female breast: Secondary | ICD-10-CM | POA: Diagnosis not present

## 2023-06-30 ENCOUNTER — Ambulatory Visit (INDEPENDENT_AMBULATORY_CARE_PROVIDER_SITE_OTHER): Payer: No Typology Code available for payment source | Admitting: Plastic Surgery

## 2023-06-30 ENCOUNTER — Encounter: Payer: Self-pay | Admitting: Plastic Surgery

## 2023-06-30 VITALS — BP 116/73 | HR 84 | Ht 65.5 in | Wt 182.8 lb

## 2023-06-30 DIAGNOSIS — Z171 Estrogen receptor negative status [ER-]: Secondary | ICD-10-CM | POA: Diagnosis not present

## 2023-06-30 DIAGNOSIS — C50411 Malignant neoplasm of upper-outer quadrant of right female breast: Secondary | ICD-10-CM | POA: Diagnosis not present

## 2023-06-30 DIAGNOSIS — C50919 Malignant neoplasm of unspecified site of unspecified female breast: Secondary | ICD-10-CM

## 2023-06-30 NOTE — Progress Notes (Signed)
 Patient ID: Marie Day, female    DOB: [birth year], 78 y.o.   MRN: [id number]   Chief Complaint  Patient presents with   Follow-up    The patient is a 78 year old female here for 1 year follow-up after breast reconstruction.  In 2022 she was treated by Dr. Vanderbilt with a right sided mastectomy and reconstruction with a implant.  On February 2024 she had exchange of the tissue expander for the implant, capsulotomies for repositioning, removal of the Port-A-Cath and a left breast mastopexy for symmetry.  She has a Dance Movement Psychotherapist high-profile gel 400 cc implant in the right breast.  She is overall pleased with her results.  She thinks that the left side is a little bigger but also noticed a 35 pound weight gain.  She is good to work on some weight reduction and if she decides we can certainly do more of a reduction on the left breast for better symmetry.     Review of Systems  Constitutional: Negative.   HENT: Negative.    Eyes: Negative.   Respiratory: Negative.    Cardiovascular: Negative.   Gastrointestinal: Negative.   Endocrine: Negative.   Genitourinary: Negative.   Musculoskeletal: Negative.     Past Medical History:  Diagnosis Date   Arthritis    Breast cancer (HCC)    Cancer [facility])     Past Surgical History:  Procedure Laterality Date   BREAST BIOPSY Right 01/30/2023   US  [name] BX W LOC DEV 1ST LESION IMG BX SPEC US  GUIDE 01/30/2023 [facility]MO[facility]AP[facility]   BREAST RECONSTRUCTION WITH PLACEMENT OF TISSUE EXPANDER AND FLEX HD (ACELLULAR HYDRATED DERMIS) Right 06/29/2021   Procedure: RIGHT BREAST RECONSTRUCTION WITH PLACEMENT OF TISSUE EXPANDER AND FLEX HD (ACELLULAR HYDRATED DERMIS);  Surgeon: Elisabeth Craig RAMAN, MD;  Location: [facility];  Service: Plastics;  Laterality: Right;   BREAST REDUCTION WITH MASTOPEXY Left 06/30/2022   Procedure: LEFT BREAST REDUCTION WITH MASTOPEXY;  Surgeon: Lowery Estefana RAMAN, DO;  Location: [facility];  Service: Plastics;   Laterality: Left;   MASTECTOMY Right 06/29/2021   MASTECTOMY W/ SENTINEL NODE BIOPSY Right 06/29/2021   Procedure: RIGHT MASTECTOMY WITH SENTINEL LYMPH NODE BIOPSY;  Surgeon: Vanderbilt Ned, MD;  Location: [facility];  Service: General;  Laterality: Right;   PORT-A-CATH REMOVAL Left 06/30/2022   Procedure: REMOVAL PORT-A-CATH;  Surgeon: Lowery Estefana RAMAN, DO;  Location: [facility];  Service: Plastics;  Laterality: Left;   REMOVAL OF TISSUE EXPANDER AND PLACEMENT OF IMPLANT Right 06/30/2022   Procedure: REMOVAL OF TISSUE EXPANDER AND PLACEMENT OF IMPLANT;  Surgeon: Lowery Estefana RAMAN, DO;  Location:  [facility];  Service: Plastics;  Laterality: Right;   WISDOM TOOTH EXTRACTION        Current Outpatient Medications:    acetaminophen  (TYLENOL  8 HOUR) 650 MG CR tablet, , Disp: , Rfl:    alendronate  (FOSAMAX ) 70 MG tablet, Take 1 tablet (70 mg total) by mouth once a week. Take with a full glass of water  on an empty stomach., Disp: 12 tablet, Rfl: 3   Boswellia-Glucosamine-Vit D (OSTEO BI-FLEX ONE PER DAY PO), , Disp: , Rfl:    Cholecalciferol (D3 HIGH POTENCY PO), , Disp: , Rfl:    Glucosamine-Chondroitin (OSTEO BI-FLEX REGULAR STRENGTH PO), Take by mouth in the morning and at bedtime., Disp: , Rfl:    ibuprofen (ADVIL) 800 MG tablet, Take 800 mg by mouth every 6 (six) hours as needed for fever,  headache, mild pain, moderate pain or cramping., Disp: , Rfl:    Omega 3 1000 MG CAPS, , Disp: , Rfl:    Turmeric 500 MG CAPS, , Disp: , Rfl:    Objective:   Vitals:   06/30/23 1326  BP: 116/73  Pulse: 84  SpO2: 95%    Physical Exam Vitals and nursing note reviewed.  Constitutional:      Appearance: Normal appearance.  HENT:     Head: Atraumatic.  Cardiovascular:     Rate and Rhythm: Normal rate.     Pulses: Normal pulses.  Pulmonary:     Effort: Pulmonary effort is normal.  Abdominal:     Palpations: Abdomen is soft.  Musculoskeletal:         General: No swelling or deformity.  Skin:    General: Skin is warm.     Capillary Refill: Capillary refill takes less than 2 seconds.  Neurological:     Mental Status: She is alert and oriented to person, place, and time.  Psychiatric:        Mood and Affect: Mood normal.        Behavior: Behavior normal.        Thought Content: Thought content normal.        Judgment: Judgment normal.     Assessment & Plan:  Malignant neoplasm of female breast, unspecified estrogen receptor status, unspecified laterality, unspecified site of breast (HCC)  Malignant neoplasm of upper-outer quadrant of right breast in female, estrogen receptor negative (HCC)  We will see her back in 1 year.  She will be eligible for an ultrasound in the next 1 to 2 years but certainly if she has any concerns.  There is a little fat necrosis on the right breast which we looked into previously and was negative.  That does not seem to have changed.  The patient will let me know if she has any questions or concerns between now and her next visit.  Estefana RAMAN Daquan Crapps, DO

## 2023-07-31 ENCOUNTER — Ambulatory Visit: Payer: No Typology Code available for payment source

## 2023-09-04 ENCOUNTER — Ambulatory Visit: Payer: No Typology Code available for payment source | Attending: Student

## 2023-09-04 VITALS — Wt 178.2 lb

## 2023-09-04 DIAGNOSIS — Z483 Aftercare following surgery for neoplasm: Secondary | ICD-10-CM

## 2023-09-04 NOTE — Therapy (Signed)
 OUTPATIENT PHYSICAL THERAPY [facility]ZO SCREENING NOTE   Patient Name: Marie Day MRN: [id number] DOB:1945-06-24, 78 y.o., female Today's Date: 09/04/2023  PCP: Ardith Dark, MD REFERRING PROVIDER: Laurena Spies, PA-C   PT End of Session - 09/04/23 1459     Visit Number 9   # unchanged due to screen only   PT Start Time 1457    PT Stop Time 1503    PT Time Calculation (min) 6 min    Activity Tolerance Patient tolerated treatment well    Behavior During Therapy [facility] for tasks assessed/performed             Past Medical History:  Diagnosis Date   Arthritis    Breast cancer (HCC)    Cancer (HCC)    Past Surgical History:  Procedure Laterality Date   BREAST BIOPSY Right 01/30/2023   [place] [name] BX W LOC DEV 1ST LESION IMG BX SPEC US GUIDE 01/30/2023 [facility]MOGRAP[facility]   BREAST RECONSTRUCTION WITH PLACEMENT OF TISSUE EXPANDER AND FLEX HD (ACELLULAR HYDRATED DERMIS) Right 06/29/2021   Procedure: RIGHT BREAST RECONSTRUCTION WITH PLACEMENT OF TISSUE EXPANDER AND FLEX HD (ACELLULAR HYDRATED DERMIS);  Surgeon: Allena Napoleon, MD;  Location: [facility];  Service: Plastics;  Laterality: Right;   BREAST REDUCTION WITH MASTOPEXY Left 06/30/2022   Procedure: LEFT BREAST REDUCTION WITH MASTOPEXY;  Surgeon: Peggye Form, DO;  Location: [facility];  Service: Plastics;  Laterality: Left;   MASTECTOMY Right 06/29/2021   MASTECTOMY W/ SENTINEL NODE BIOPSY Right 06/29/2021   Procedure: RIGHT MASTECTOMY WITH SENTINEL LYMPH NODE BIOPSY;  Surgeon: Harriette Bouillon, MD;  Location: [facility];  Service: General;  Laterality: Right;   PORT-A-CATH REMOVAL Left 06/30/2022   Procedure: REMOVAL PORT-A-CATH;  Surgeon: Peggye Form, DO;  Location: [facility];  Service: Plastics;  Laterality: Left;   REMOVAL OF TISSUE EXPANDER AND PLACEMENT OF IMPLANT Right 06/30/2022   Procedure: REMOVAL OF TISSUE EXPANDER AND PLACEMENT OF IMPLANT;   Surgeon: Peggye Form, DO;  Location: [facility];  Service: Plastics;  Laterality: Right;   WISDOM TOOTH EXTRACTION     Patient Active Problem List   Diagnosis Date Noted   Dyslipidemia 03/24/2023   Body mass index (BMI) 24.0-24.9, adult 02/28/2023   Joint swelling 02/28/2023   Other specified abnormal immunological findings in serum 02/28/2023   Pain in limb 02/28/2023   Tinnitus 12/21/2022   Breast cancer (HCC) 12/21/2022   Joint stiffness 09/01/2022   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   Polyarthralgia 02/08/2022   S/P breast reconstruction 06/29/2021   Malignant neoplasm of upper-outer quadrant of right breast in female, estrogen receptor negative (HCC) 12/21/2020   Anemia 10/27/2020   Prediabetes 10/27/2020   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 Completed radiation and chemo.  Left shoulder is not good   PRECAUTIONS: right UE Lymphedema risk, None  SUBJECTIVE: Pt returns for her 3 month L-Dex screen. "I've had some bumps pop up on the inside of my elbow."  PAIN:  Are you having pain? No  SOZO SCREENING: Patient was assessed today using the SOZO machine to determine the lymphedema index score. This was compared to her baseline score.  It was determined that she is within the recommended range when compared to her baseline and no further action is needed at this time. She will continue SOZO screenings. These are done every 3 months for 2 years post operatively followed by every 6 months for 2 years, and then annually. Advised pt to update doctor about bumps at inner arm.    L-DEX FLOWSHEETS - 09/04/23 1500       L-DEX LYMPHEDEMA  SCREENING   Measurement Type Unilateral    L-DEX MEASUREMENT EXTREMITY Upper Extremity    POSITION  Standing    DOMINANT SIDE Right    At Risk Side Right    BASELINE SCORE (UNILATERAL) -3.8    L-DEX SCORE (UNILATERAL) -0.5    VALUE CHANGE (UNILAT) 3.3              Denyce Flank, PTA 09/04/2023, 3:00 PM

## 2023-09-15 DIAGNOSIS — E663 Overweight: Secondary | ICD-10-CM | POA: Diagnosis not present

## 2023-09-15 DIAGNOSIS — Z6829 Body mass index (BMI) 29.0-29.9, adult: Secondary | ICD-10-CM | POA: Diagnosis not present

## 2023-09-15 DIAGNOSIS — H409 Unspecified glaucoma: Secondary | ICD-10-CM | POA: Diagnosis not present

## 2023-09-15 DIAGNOSIS — G620 Drug-induced polyneuropathy: Secondary | ICD-10-CM | POA: Diagnosis not present

## 2023-09-15 DIAGNOSIS — R7303 Prediabetes: Secondary | ICD-10-CM | POA: Diagnosis not present

## 2023-09-15 DIAGNOSIS — R2681 Unsteadiness on feet: Secondary | ICD-10-CM | POA: Diagnosis not present

## 2023-09-15 DIAGNOSIS — M810 Age-related osteoporosis without current pathological fracture: Secondary | ICD-10-CM | POA: Diagnosis not present

## 2023-09-15 DIAGNOSIS — Z8589 Personal history of malignant neoplasm of other organs and systems: Secondary | ICD-10-CM | POA: Diagnosis not present

## 2023-09-15 DIAGNOSIS — Z853 Personal history of malignant neoplasm of breast: Secondary | ICD-10-CM | POA: Diagnosis not present

## 2023-09-15 DIAGNOSIS — Z008 Encounter for other general examination: Secondary | ICD-10-CM | POA: Diagnosis not present

## 2023-09-18 ENCOUNTER — Ambulatory Visit (INDEPENDENT_AMBULATORY_CARE_PROVIDER_SITE_OTHER): Admitting: Family Medicine

## 2023-09-18 ENCOUNTER — Encounter: Payer: Self-pay | Admitting: Family Medicine

## 2023-09-18 VITALS — BP 121/74 | HR 85 | Temp 97.3°F | Ht 65.5 in | Wt 180.2 lb

## 2023-09-18 DIAGNOSIS — R238 Other skin changes: Secondary | ICD-10-CM

## 2023-09-18 DIAGNOSIS — G4700 Insomnia, unspecified: Secondary | ICD-10-CM | POA: Insufficient documentation

## 2023-09-18 MED ORDER — TRAZODONE HCL 50 MG PO TABS
25.0000 mg | ORAL_TABLET | Freq: Every evening | ORAL | 3 refills | Status: DC | PRN
Start: 2023-09-18 — End: 2023-10-12

## 2023-09-18 NOTE — Progress Notes (Signed)
   Marie Day is a 78 y.o. female who presents today for an office visit.  Assessment/Plan:  New/Acute Problems: Papules No red flags.  She is currently asymptomatic.  May have mild sebaceous hyperplasia.  We did discuss referral to dermatology however she declined.  Will continue to fluctuating.  She will let us  know if she develops any new symptoms.  Chronic Problems Addressed Today: Insomnia Had lengthy discussion with patient today regarding her insomnia.  She has not had much benefit with over-the-counter medications.  Will try trazodone 25 to 50 mg nightly.  We discussed potential side effects.  She will follow-up with me in a few weeks via MyChart we can adjust as tolerated.     Subjective:  HPI:  See Assessment / plan for status of chronic conditions.   Patient here with lesion on the right upper arm.  This has been going on for a few months.  No pain or itching.  No redness.  She has noticed a very few small raised bumps on her right inner arm.  No treatments tried.  No obvious precipitating events.   She has also had ongoing difficulty with insomnia.  This has been going on for quite a while.  She has tried several over-the-counter medications without much improvement.  Normally can fall asleep okay however has difficulty with waking up frequently and not being able to get a good night sleep.       Objective:  Physical Exam: BP 121/74   Pulse 85   Temp (!) 97.3 F (36.3 C) (Temporal)   Ht 5' 5.5" (1.664 m)   Wt 180 lb 3.2 oz (81.7 kg)   SpO2 96%   BMI 29.53 kg/m   Gen: No acute distress, resting comfortably CV: Regular rate and rhythm with no murmurs appreciated Pulm: Normal work of breathing, clear to auscultation bilaterally with no crackles, wheezes, or rhonchi Skin: A few scattered discrete soft approximately 2 to 3 mm raised cystic-appearing lesions on right medial upper arm Neuro: Grossly normal, moves all extremities Psych: Normal affect and thought  content      Ishmel Acevedo M. Daneil Dunker, MD 09/18/2023 12:37 PM

## 2023-09-18 NOTE — Assessment & Plan Note (Signed)
 Had lengthy discussion with patient today regarding her insomnia.  She has not had much benefit with over-the-counter medications.  Will try trazodone 25 to 50 mg nightly.  We discussed potential side effects.  She will follow-up with me in a few weeks via MyChart we can adjust as tolerated.

## 2023-09-18 NOTE — Patient Instructions (Signed)
 It was very nice to see you today!  Please try the trazodone to help with your sleep.  The small bumps on your arm should improve over the next several months.  Please let us  know if you develop any new symptoms or if this does not resolve soon.  Return if symptoms worsen or fail to improve.   Take care, Dr Daneil Dunker  PLEASE NOTE:  If you had any lab tests, please let us  know if you have not heard back within a few days. You may see your results on mychart before we have a chance to review them but we will give you a call once they are reviewed by us .   If we ordered any referrals today, please let us  know if you have not heard from their office within the next week.   If you had any urgent prescriptions sent in today, please check with the pharmacy within an hour of our visit to make sure the prescription was transmitted appropriately.   Please try these tips to maintain a healthy lifestyle:  Eat at least 3 REAL meals and 1-2 snacks per day.  Aim for no more than 5 hours between eating.  If you eat breakfast, please do so within one hour of getting up.   Each meal should contain half fruits/vegetables, one quarter protein, and one quarter carbs (no bigger than a computer mouse)  Cut down on sweet beverages. This includes juice, soda, and sweet tea.   Drink at least 1 glass of water with each meal and aim for at least 8 glasses per day  Exercise at least 150 minutes every week.  *

## 2023-09-19 ENCOUNTER — Other Ambulatory Visit: Payer: Self-pay | Admitting: Hematology and Oncology

## 2023-09-19 DIAGNOSIS — Z0000 Encounter for general adult medical examination without abnormal findings: Secondary | ICD-10-CM

## 2023-09-20 ENCOUNTER — Ambulatory Visit
Admission: RE | Admit: 2023-09-20 | Discharge: 2023-09-20 | Disposition: A | Discharge status: Home / Self Care | Source: Ambulatory Visit | Attending: Hematology and Oncology | Admitting: Hematology and Oncology

## 2023-09-20 DIAGNOSIS — Z1231 Encounter for screening mammogram for malignant neoplasm of breast: Secondary | ICD-10-CM | POA: Diagnosis not present

## 2023-09-20 DIAGNOSIS — Z0000 Encounter for general adult medical examination without abnormal findings: Secondary | ICD-10-CM

## 2023-09-27 ENCOUNTER — Encounter: Payer: Self-pay | Admitting: Emergency Medicine

## 2023-09-27 ENCOUNTER — Ambulatory Visit: Admission: EM | Admit: 2023-09-27 | Discharge: 2023-09-27 | Disposition: A | Discharge status: Home / Self Care

## 2023-09-27 DIAGNOSIS — M5412 Radiculopathy, cervical region: Secondary | ICD-10-CM | POA: Diagnosis not present

## 2023-09-27 DIAGNOSIS — R768 Other specified abnormal immunological findings in serum: Secondary | ICD-10-CM | POA: Insufficient documentation

## 2023-09-27 MED ORDER — TIZANIDINE HCL 2 MG PO TABS
2.0000 mg | ORAL_TABLET | Freq: Two times a day (BID) | ORAL | 0 refills | Status: DC | PRN
Start: 2023-09-27 — End: 2023-12-10

## 2023-09-27 MED ORDER — TIZANIDINE HCL 2 MG PO TABS
2.0000 mg | ORAL_TABLET | Freq: Every evening | ORAL | 0 refills | Status: DC | PRN
Start: 2023-09-27 — End: 2023-09-27

## 2023-09-27 MED ORDER — DEXAMETHASONE SODIUM PHOSPHATE 10 MG/ML IJ SOLN
10.0000 mg | Freq: Once | INTRAMUSCULAR | Status: AC
  Administered 2023-09-27: 10 mg via INTRAMUSCULAR

## 2023-09-27 NOTE — ED Provider Notes (Signed)
 [facility]    CSN: [id number] Arrival date & time: 09/27/23  1309      History   Chief Complaint Chief Complaint  Patient presents with   Neck Pain    HPI Marie Day is a 78 y.o. female.    Neck Pain Patient with a history of arthritis presents today with 1 week of neck pain.  She denies injury has taken multiple medications over-the-counter to try and alleviate pain and pain has not worsened.  She endorses some stiffness in the back patient experiencing muscle spasms this morning.  Movement of the right extremity is causing an intense shooting spasmatic pain in the left neck.  Has taken Tylenol  once or twice to help reduce pain however has not taken any medications today.  Denies any history of recurrent neck or back pain.  The history of a positive ANA level and previous diagnosis of polyarthralgias.  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   Polyarthralgia 02/08/2022   S/P breast reconstruction 06/29/2021   Malignant neoplasm of upper-outer quadrant of right breast in female, estrogen receptor negative (HCC) 12/21/2020   Anemia 10/27/2020   Prediabetes 10/27/2020   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   Unspecified age-related cataract 12/14/2015    Past Surgical History:  Procedure Laterality Date   BREAST BIOPSY Right 01/30/2023   US  [name] BX W LOC DEV 1ST LESION IMG BX SPEC US  GUIDE 01/30/2023 [facility]  MAMMOGRAPHY   BREAST RECONSTRUCTION WITH PLACEMENT OF TISSUE EXPANDER AND FLEX HD (ACELLULAR HYDRATED DERMIS) Right 06/29/2021   Procedure: RIGHT BREAST RECONSTRUCTION WITH PLACEMENT OF TISSUE EXPANDER AND FLEX HD (ACELLULAR HYDRATED DERMIS);  Surgeon: Barb Bonito, MD;  Location: [facility];  Service: Plastics;  Laterality: Right;   BREAST REDUCTION WITH MASTOPEXY Left 06/30/2022   Procedure: LEFT BREAST REDUCTION WITH MASTOPEXY;  Surgeon: Thornell Flirt, DO;  Location: [facility];  Service: Plastics;  Laterality: Left;   MASTECTOMY Right 06/29/2021   MASTECTOMY W/ SENTINEL NODE BIOPSY Right 06/29/2021   Procedure: RIGHT MASTECTOMY WITH SENTINEL LYMPH NODE BIOPSY;  Surgeon: Sim Dryer, MD;  Location: [facility];  Service: General;  Laterality: Right;   PORT-A-CATH REMOVAL Left 06/30/2022   Procedure: REMOVAL PORT-A-CATH;  Surgeon: Thornell Flirt, DO;  Location: [facility];  Service: Plastics;  Laterality: Left;   REMOVAL OF TISSUE EXPANDER AND PLACEMENT OF IMPLANT Right 06/30/2022   Procedure: REMOVAL OF TISSUE EXPANDER AND PLACEMENT OF IMPLANT;  Surgeon: Thornell Flirt, DO;  Location: [facility];  Service: Plastics;  Laterality: Right;   WISDOM TOOTH EXTRACTION      OB History   No obstetric history on file.      Home Medications    Prior to Admission medications   Medication Sig Start Date End Date Taking? Authorizing Provider  alendronate  (FOSAMAX ) 70 MG tablet Take 1 tablet (70 mg total) by  mouth once a week. Take with a full glass of water on an empty stomach. 12/26/22  Yes Gudena, Vinay, MD  Boswellia-Glucosamine-Vit D (OSTEO BI-FLEX ONE PER DAY PO)    Yes [provider]  Cholecalciferol (D3 HIGH POTENCY PO)    Yes [provider]  Glucosamine-Chondroitin (OSTEO BI-FLEX REGULAR STRENGTH PO) Take by mouth in the morning and at bedtime.   Yes [provider]   Omega 3 1000 MG CAPS    Yes [provider]  traZODone  (DESYREL ) 50 MG tablet Take 0.5-1 tablets (25-50 mg total) by mouth at bedtime as needed for sleep. 09/18/23  Yes Rodney Clamp, MD  Turmeric 500 MG CAPS    Yes [provider]  ofloxacin (OCUFLOX) 0.3 % ophthalmic solution INSTILL 1 DROP BY OPHTHALMIC ROUTE 4 TIMES EVERY DAY INTO LEFT EYE FOR 1 WEEK, THEN STOP Ophthalmic for 25 Days Patient not taking: Reported on 09/27/2023    [provider]  [facility] injection  05/29/23   [provider]  tiZANidine (ZANAFLEX) 2 MG tablet Take 1-2 tablets (2-4 mg total) by mouth 2 (two) times daily as needed for muscle spasms. 09/27/23   Buena Carmine, NP    Family History Family History  Problem Relation Age of Onset   Hyperlipidemia Mother    Bone cancer Mother    Hearing loss Mother    Hearing loss Father    Hyperlipidemia Sister    Early death Brother    Birth defects Brother    Early death Maternal Grandmother    Pancreatic cancer Maternal Grandmother    Cancer Maternal Grandfather    Pancreatic cancer Paternal Grandmother    Cancer Paternal Grandfather    Depression Son    Colon cancer Neg Hx    Breast cancer Neg Hx     Social History Social History   Tobacco Use   Smoking status: Never   Smokeless tobacco: Never  Vaping Use   Vaping status: Never Used  Substance Use Topics   Alcohol use: Not Currently   Drug use: Never     Allergies   Sulfa antibiotics   Review of Systems Review of Systems  Musculoskeletal:  Positive for neck pain.     Physical Exam Triage Vital Signs ED Triage Vitals  Encounter Vitals Group     BP 09/27/23 1411 133/83     Systolic BP Percentile --      Diastolic BP Percentile --      Pulse Rate 09/27/23 1411 80     Resp 09/27/23 1411 16     Temp 09/27/23 1411 98.1 F (36.7 C)     Temp Source 09/27/23 1411 Oral     SpO2 09/27/23 1411 95 %     Weight --      Height --      Head Circumference --       Peak Flow --      Pain Score 09/27/23 1412 2     Pain Loc --      Pain Education --      Exclude from Growth Chart --    No data found.  Updated Vital Signs BP 133/83 (BP Location: Left Arm)   Pulse 80   Temp 98.1 F (36.7 C) (Oral)   Resp 16   SpO2 95%   Visual Acuity Right Eye Distance:   Left Eye Distance:   Bilateral Distance:    Right Eye Near:   Left Eye Near:    Bilateral  Near:     Physical Exam Vitals reviewed.  Constitutional:      Appearance: Normal appearance.  HENT:     Head: Normocephalic and atraumatic.  Eyes:     Extraocular Movements: Extraocular movements intact.     Pupils: Pupils are equal, round, and reactive to light.  Cardiovascular:     Rate and Rhythm: Normal rate.  Pulmonary:     Effort: Pulmonary effort is normal.     Breath sounds: Normal breath sounds.  Musculoskeletal:     Cervical back: Pain with movement, spinous process tenderness and muscular tenderness present. Decreased range of motion.  Skin:    General: Skin is warm and dry.  Neurological:     General: No focal deficit present.     Mental Status: She is alert and oriented to person, place, and time.      UC Treatments / Results  Labs (all labs ordered are listed, but only abnormal results are displayed) Labs Reviewed - No data to display  EKG   Radiology No results found.  Procedures Procedures (including critical care time)  Medications Ordered in UC Medications  dexamethasone  (DECADRON ) injection 10 mg (10 mg Intramuscular Given 09/27/23 1505)    Initial Impression / Assessment and Plan / UC Course  I have reviewed the triage vital signs and the nursing notes.  Pertinent labs & imaging results that were available during my care of the patient were reviewed by me and considered in my medical decision making (see chart for details).     Final Clinical Impressions(s) / UC Diagnoses   Final diagnoses:  Cervical radiculopathy     Discharge  Instructions      Your neck pain is not getting better or at any point if it worsens after receiving the injection you received today I would like for you to follow-up with [facility] walk-in for further workup and evaluation of your neck pain.  I have included their information on your discharge paperwork.  As discussed tizanidine as a muscle relaxer you may take 1 to 2 tablets as needed     ED Prescriptions     Medication Sig Dispense Auth. Provider   tiZANidine (ZANAFLEX) 2 MG tablet  (Status: Discontinued) Take 1-2 tablets (2-4 mg total) by mouth at bedtime and may repeat dose one time if needed. 20 tablet Buena Carmine, NP   tiZANidine (ZANAFLEX) 2 MG tablet Take 1-2 tablets (2-4 mg total) by mouth 2 (two) times daily as needed for muscle spasms. 20 tablet Buena Carmine, NP      PDMP not reviewed this encounter.   Buena Carmine, NP 09/27/23 1505

## 2023-09-27 NOTE — ED Triage Notes (Signed)
 Pt reports L-sided neck pain x1 week that has gradually gotten worse. Started having neck muscle spasms this morning and states "moving my right arm just causes an intense grabbing pain in my neck." "I sneezed and it liked to kill me." No relieving factors. Pt reports stiffness with intermittent pain episodes.

## 2023-09-27 NOTE — Discharge Instructions (Addendum)
 Your neck pain is not getting better or at any point if it worsens after receiving the injection you received today I would like for you to follow-up with [facility] walk-in for further workup and evaluation of your neck pain.  I have included their information on your discharge paperwork.  As discussed tizanidine as a muscle relaxer you may take 1 to 2 tablets as needed

## 2023-09-29 DIAGNOSIS — M47812 Spondylosis without myelopathy or radiculopathy, cervical region: Secondary | ICD-10-CM | POA: Diagnosis not present

## 2023-10-10 ENCOUNTER — Ambulatory Visit (INDEPENDENT_AMBULATORY_CARE_PROVIDER_SITE_OTHER)

## 2023-10-10 VITALS — Ht 64.5 in | Wt 180.0 lb

## 2023-10-10 DIAGNOSIS — Z0000 Encounter for general adult medical examination without abnormal findings: Secondary | ICD-10-CM | POA: Diagnosis not present

## 2023-10-10 NOTE — Progress Notes (Signed)
 Subjective:   Marie Day is a 78 y.o. who presents for a Medicare Wellness preventive visit.  As a reminder, Annual Wellness Visits don't include a physical exam, and some assessments may be limited, especially if this visit is performed virtually. We may recommend an in-person follow-up visit with your provider if needed.  Visit Complete: Virtual I connected with  Marie Day on 10/10/23 by a audio enabled telemedicine application and verified that I am speaking with the correct person using two identifiers.  Patient Location: Home  Provider Location: Office/Clinic  I discussed the limitations of evaluation and management by telemedicine. The patient expressed understanding and agreed to proceed.  Vital Signs: Because this visit was a virtual/telehealth visit, some criteria may be missing or patient reported. Any vitals not documented were not able to be obtained and vitals that have been documented are patient reported.  VideoDeclined- This patient declined Librarian, academic. Therefore the visit was completed with audio only.  Persons Participating in Visit: Patient.  AWV Questionnaire: Yes: Patient Medicare AWV questionnaire was completed by the patient on 10/06/23; I have confirmed that all information answered by patient is correct and no changes since this date.  Cardiac Risk Factors include: advanced age (>56men, >70 women);dyslipidemia;obesity (BMI >30kg/m2)     Objective:     Today's Vitals   10/06/23 2217 10/10/23 1346  Weight:  180 lb (81.6 kg)  Height:  5' 4.5" (1.638 m)  PainSc: 2     Body mass index is 30.42 kg/m.     10/10/2023    1:56 PM 12/26/2022   11:53 AM 11/15/2022   10:00 AM 06/30/2022    9:21 AM 06/27/2022   12:00 PM 12/21/2021   10:54 AM 11/09/2021   11:40 AM  Advanced Directives  Does Patient Have a Medical Advance Directive? No Yes No Yes Yes No No  Type of Special educational needs teacher of [place];Living will   Healthcare Power of [place];Living will Living will;Healthcare Power of Attorney    Does patient want to make changes to medical advance directive?    No - Patient declined     Copy of Healthcare Power of Attorney in Chart?  No - copy requested  No - copy requested No - copy requested    Would patient like information on creating a medical advance directive? No - Patient declined  No - Patient declined        Current Medications (verified) Outpatient Encounter Medications as of 10/10/2023  Medication Sig   alendronate  (FOSAMAX ) 70 MG tablet Take 1 tablet (70 mg total) by mouth once a week. Take with a full glass of water on an empty stomach.   Cholecalciferol (D3 HIGH POTENCY PO)    Omega 3 1000 MG CAPS    tiZANidine  (ZANAFLEX ) 2 MG tablet Take 1-2 tablets (2-4 mg total) by mouth 2 (two) times daily as needed for muscle spasms.   traZODone  (DESYREL ) 50 MG tablet Take 0.5-1 tablets (25-50 mg total) by mouth at bedtime as needed for sleep.   Turmeric 500 MG CAPS    Glucosamine-Chondroitin (OSTEO BI-FLEX REGULAR STRENGTH PO) Take by mouth in the morning and at bedtime. (Patient not taking: Reported on 10/10/2023)   [DISCONTINUED] Boswellia-Glucosamine-Vit D (OSTEO BI-FLEX ONE PER DAY PO)    [DISCONTINUED] ofloxacin (OCUFLOX) 0.3 % ophthalmic solution INSTILL 1 DROP BY OPHTHALMIC ROUTE 4 TIMES EVERY DAY INTO LEFT EYE FOR 1 WEEK, THEN STOP Ophthalmic for 25 Days (Patient not taking: Reported  on 09/27/2023)   [DISCONTINUED] SHINGRIX injection  (Patient not taking: Reported on 09/27/2023)   No facility-administered encounter medications on file as of 10/10/2023.    Allergies (verified) Sulfa antibiotics   History: Past Medical History:  Diagnosis Date   Arthritis    Breast cancer (HCC)    Cancer [facility])    Past Surgical History:  Procedure Laterality Date   BREAST BIOPSY Right 01/30/2023   US  [name] BX W LOC DEV 1ST LESION IMG BX SPEC US  GUIDE 01/30/2023 [facility] MAMMOGRAPHY   BREAST  RECONSTRUCTION WITH PLACEMENT OF TISSUE EXPANDER AND FLEX HD (ACELLULAR HYDRATED DERMIS) Right 06/29/2021   Procedure: RIGHT BREAST RECONSTRUCTION WITH PLACEMENT OF TISSUE EXPANDER AND FLEX HD (ACELLULAR HYDRATED DERMIS);  Surgeon: Barb Bonito, MD;  Location: [facility];  Service: Plastics;  Laterality: Right;   BREAST REDUCTION WITH MASTOPEXY Left 06/30/2022   Procedure: LEFT BREAST REDUCTION WITH MASTOPEXY;  Surgeon: Thornell Flirt, DO;  Location: [facility];  Service: Plastics;  Laterality: Left;   MASTECTOMY Right 06/29/2021   MASTECTOMY W/ SENTINEL NODE BIOPSY Right 06/29/2021   Procedure: RIGHT MASTECTOMY WITH SENTINEL LYMPH NODE BIOPSY;  Surgeon: Sim Dryer, MD;  Location: [facility];  Service: General;  Laterality: Right;   PORT-A-CATH REMOVAL Left 06/30/2022   Procedure: REMOVAL PORT-A-CATH;  Surgeon: Thornell Flirt, DO;  Location: [facility];  Service: Plastics;  Laterality: Left;   REMOVAL OF TISSUE EXPANDER AND PLACEMENT OF IMPLANT Right 06/30/2022   Procedure: REMOVAL OF TISSUE EXPANDER AND PLACEMENT OF IMPLANT;  Surgeon: Thornell Flirt, DO;  Location: [facility];  Service: Plastics;  Laterality: Right;   WISDOM TOOTH EXTRACTION     Family History  Problem Relation Age of Onset   Hyperlipidemia Mother    Bone cancer Mother    Hearing loss Mother    Hearing loss Father    Hyperlipidemia Sister    Early death Brother    Birth defects Brother    Early death Maternal Grandmother    Pancreatic cancer Maternal Grandmother    Cancer Maternal Grandfather    Pancreatic cancer Paternal Grandmother    Cancer Paternal Grandfather    Depression Son    Colon cancer Neg Hx    Breast cancer Neg Hx    Social History   Socioeconomic History   Marital status: Married    Spouse name: Not on file   Number of children: Not on file   Years of education: Not on file   Highest education level:  Some college, no degree  Occupational History   Not on file  Tobacco Use   Smoking status: Never   Smokeless tobacco: Never  Vaping Use   Vaping status: Never Used  Substance and Sexual Activity   Alcohol use: Not Currently   Drug use: Never   Sexual activity: Not Currently  Other Topics Concern   Not on file  Social History Narrative   Not on file   Social Drivers of Health   Financial Resource Strain: Low Risk  (10/10/2023)   Overall Financial Resource Strain (CARDIA)    Difficulty of Paying Living Expenses: Not hard at all  Food Insecurity: No Food Insecurity (10/10/2023)   Hunger Vital Sign    Worried About Running Out of Food in the Last Year: Never true    Ran Out of Food in the Last Year: Never true  Transportation Needs: No Transportation Needs (10/10/2023)   PRAPARE - Transportation  Lack of Transportation (Medical): No    Lack of Transportation (Non-Medical): No  Physical Activity: Inactive (10/10/2023)   Exercise Vital Sign    Days of Exercise per Week: 0 days    Minutes of Exercise per Session: 0 min  Stress: No Stress Concern Present (10/10/2023)   [employer] of Occupational Health - Occupational Stress Questionnaire    Feeling of Stress : Not at all  Social Connections: Moderately Integrated (10/10/2023)   Social Connection and Isolation Panel [NHANES]    Frequency of Communication with Friends and Family: Three times a week    Frequency of Social Gatherings with Friends and Family: Three times a week    Attends Religious Services: More than 4 times per year    Active Member of Clubs or Organizations: No    Attends Banker Meetings: Never    Marital Status: Married    Tobacco Counseling Counseling given: Not Answered    Clinical Intake:  Pre-visit preparation completed: Yes  Pain : 0-10 Pain Score: 2  Pain Type: Chronic pain Pain Location: Neck Pain Orientation: Left Pain Descriptors / Indicators: Aching Pain Onset: More than  a month ago Pain Frequency: Intermittent     BMI - recorded: 30.42 Nutritional Status: BMI > 30  Obese Nutritional Risks: None Diabetes: No  Lab Results  Component Value Date   HGBA1C 5.8 03/23/2023     How often do you need to have someone help you when you read instructions, pamphlets, or other written materials from your doctor or pharmacy?: 1 - Never  Interpreter Needed?: No  Information entered by :: Lamont Pilsner, LPN   Activities of Daily Living     10/06/2023   10:17 PM  In your present state of health, do you have any difficulty performing the following activities:  Hearing? 0  Vision? 0  Difficulty concentrating or making decisions? 0  Walking or climbing stairs? 0  Dressing or bathing? 0  Doing errands, shopping? 0  Preparing Food and eating ? N  Using the Toilet? N  In the past six months, have you accidently leaked urine? Y  Comment wears a pad  Do you have problems with loss of bowel control? N  Managing your Medications? N  Managing your Finances? N  Housekeeping or managing your Housekeeping? N    Patient Care Team: Rodney Clamp, MD as PCP - General (Family Medicine) Cameron Cea, MD as Consulting Physician (Hematology and Oncology) Johna Myers, MD as Consulting Physician (Radiation Oncology) Sim Dryer, MD as Consulting Physician (General Surgery)  Indicate any recent Medical Services you may have received from other than [facility] providers in the past year (date may be approximate).     Assessment:    This is a routine wellness examination for [place].  Hearing/Vision screen Hearing Screening - Comments:: Pt denies any hearing issues  Vision Screening - Comments:: Wears rx glasses - up to date with routine eye exams with Dr Reatha Campanile     Goals Addressed             This Visit's Progress    Patient Stated       Be able to care for self        Depression Screen     10/10/2023    1:53 PM 09/18/2023   11:52 AM 01/30/2023    11:03 AM 12/21/2022    8:57 AM 11/29/2022   10:15 AM  PHQ 2/9 Scores  PHQ - 2 Score 0 0 0 0 1  PHQ- 9 Score     8    Fall Risk     10/06/2023   10:17 PM 09/18/2023   11:52 AM 03/23/2023   11:10 AM 01/30/2023   11:03 AM 12/21/2022    8:57 AM  Fall Risk   Falls in the past year? 1 1 1  0 0  Number falls in past yr: 0 0 1  0  Injury with Fall? 0 0 0    Risk for fall due to : History of fall(s);Impaired balance/gait No Fall Risks No Fall Risks  No Fall Risks  Follow up Falls prevention discussed        MEDICARE RISK AT HOME:  Medicare Risk at Home Any stairs in or around the home?: (Patient-Rptd) Yes If so, are there any without handrails?: (Patient-Rptd) No Home free of loose throw rugs in walkways, pet beds, electrical cords, etc?: (Patient-Rptd) Yes Adequate lighting in your home to reduce risk of falls?: (Patient-Rptd) Yes Life alert?: (Patient-Rptd) No Use of a cane, walker or w/c?: (Patient-Rptd) No Grab bars in the bathroom?: (Patient-Rptd) Yes Shower chair or bench in shower?: (Patient-Rptd) No Elevated toilet seat or a handicapped toilet?: (Patient-Rptd) Yes  TIMED UP AND GO:  Was the test performed?  No  Cognitive Function: 6CIT completed        10/10/2023    1:58 PM  6CIT Screen  What Year? 0 points  What month? 0 points  What time? 0 points  Count back from 20 0 points  Months in reverse 0 points  Repeat phrase 0 points  Total Score 0 points    Immunizations Immunization History  Administered Date(s) Administered   Fluad Quad(high Dose 65+) 03/01/2021   Hepatitis A, Ped/Adol-2 Dose 11/17/2000, 01/16/2001   Hepatitis B, ADULT 11/17/2000, 01/16/2001, 07/04/2001   IPV 06/24/1963, 08/22/1963   Influenza, Seasonal, Injecte, Preservative Fre 02/03/1999, 02/26/2016   Janssen (J&J) SARS-COV-2 Vaccination 08/23/2019   PFIZER Comirnaty(Gray Top)Covid-19 Tri-Sucrose Vaccine 11/04/2020   Pneumococcal Conjugate-13 02/26/2016   Pneumococcal-Unspecified 03/30/2023    Td 02/03/1999   Tdap 11/17/2000, 02/26/2016   Typhoid Inactivated 02/26/2016   Zoster Recombinant(Shingrix) 03/24/2023, 05/29/2023    Screening Tests Health Maintenance  Topic Date Due   COVID-19 Vaccine (3 - 2024-25 season) 01/22/2023   Hepatitis C Screening  12/21/2023 (Originally 01/10/1964)   Pneumonia Vaccine 36+ Years old (2 of 2 - PPSV23) 09/17/2024 (Originally 05/25/2023)   INFLUENZA VACCINE  12/22/2023   DEXA SCAN  06/03/2024   Medicare Annual Wellness (AWV)  10/09/2024   DTaP/Tdap/Td (4 - Td or Tdap) 02/25/2026   Zoster Vaccines- Shingrix  Completed   HPV VACCINES  Aged Out   Meningococcal B Vaccine  Aged Out    Health Maintenance  Health Maintenance Due  Topic Date Due   COVID-19 Vaccine (3 - 2024-25 season) 01/22/2023   Health Maintenance Items Addressed: See Nurse Notes  Additional Screening:  Vision Screening: Recommended annual ophthalmology exams for early detection of glaucoma and other disorders of the eye.  Dental Screening: Recommended annual dental exams for proper oral hygiene  Community Resource Referral / Chronic Care Management: CRR required this visit?  No   CCM required this visit?  No   Plan:    I have personally reviewed and noted the following in the patient's chart:   Medical and social history Use of alcohol, tobacco or illicit drugs  Current medications and supplements including opioid prescriptions. Patient is not currently taking opioid prescriptions. Functional ability and status Nutritional status Physical activity  Advanced directives List of other physicians Hospitalizations, surgeries, and ER visits in previous 12 months Vitals Screenings to include cognitive, depression, and falls Referrals and appointments  In addition, I have reviewed and discussed with patient certain preventive protocols, quality metrics, and best practice recommendations. A written personalized care plan for preventive services as well as general  preventive health recommendations were provided to patient.   Bruno Capri, LPN   01/19/5620   After Visit Summary: (MyChart) Due to this being a telephonic visit, the after visit summary with patients personalized plan was offered to patient via MyChart   Notes: Nothing significant to report at this time.

## 2023-10-10 NOTE — Patient Instructions (Signed)
 Marie Day , Thank you for taking time out of your busy schedule to complete your Annual Wellness Visit with me. I enjoyed our conversation and look forward to speaking with you again next year. I, as well as your care team,  appreciate your ongoing commitment to your health goals. Please review the following plan we discussed and let me know if I can assist you in the future. Your Game plan/ To Do List    Referrals: If you haven't heard from the office you've been referred to, please reach out to them at the phone provided.   Follow up Visits: Next Medicare AWV with our clinical staff: 10/21/24   Have you seen your provider in the last 6 months (3 months if uncontrolled diabetes)? Yes Next Office Visit with your provider: 03/25/24  Clinician Recommendations:  Aim for 30 minutes of exercise or brisk walking, 6-8 glasses of water, and 5 servings of fruits and vegetables each day.       This is a list of the screening recommended for you and due dates:  Health Maintenance  Topic Date Due   COVID-19 Vaccine (3 - 2024-25 season) 01/22/2023   Hepatitis C Screening  12/21/2023*   Pneumonia Vaccine (2 of 2 - PPSV23) 09/17/2024*   Flu Shot  12/22/2023   DEXA scan (bone density measurement)  06/03/2024   Medicare Annual Wellness Visit  10/09/2024   DTaP/Tdap/Td vaccine (4 - Td or Tdap) 02/25/2026   Zoster (Shingles) Vaccine  Completed   HPV Vaccine  Aged Out   Meningitis B Vaccine  Aged Out  *Topic was postponed. The date shown is not the original due date.    Advanced directives: (Declined) Advance directive discussed with you today. Even though you declined this today, please call our office should you change your mind, and we can give you the proper paperwork for you to fill out. Advance Care Planning is important because it:  [x]  Makes sure you receive the medical care that is consistent with your values, goals, and preferences  [x]  It provides guidance to your family and loved ones and  reduces their decisional burden about whether or not they are making the right decisions based on your wishes.  Follow the link provided in your after visit summary or read over the paperwork we have mailed to you to help you started getting your Advance Directives in place. If you need assistance in completing these, please reach out to us  so that we can help you!  See attachments for Preventive Care and Fall Prevention Tips.

## 2023-10-12 ENCOUNTER — Other Ambulatory Visit: Payer: Self-pay | Admitting: Family Medicine

## 2023-10-18 DIAGNOSIS — M542 Cervicalgia: Secondary | ICD-10-CM | POA: Diagnosis not present

## 2023-11-23 ENCOUNTER — Other Ambulatory Visit: Payer: Self-pay | Admitting: Hematology and Oncology

## 2023-12-10 ENCOUNTER — Encounter (HOSPITAL_COMMUNITY)
Admission: EM | Disposition: A | Discharge status: Home Health | Payer: Self-pay | Source: Home / Self Care | Attending: Internal Medicine

## 2023-12-10 ENCOUNTER — Inpatient Hospital Stay (HOSPITAL_COMMUNITY)

## 2023-12-10 ENCOUNTER — Inpatient Hospital Stay (HOSPITAL_COMMUNITY): Admitting: Anesthesiology

## 2023-12-10 ENCOUNTER — Other Ambulatory Visit: Payer: Self-pay

## 2023-12-10 ENCOUNTER — Inpatient Hospital Stay (HOSPITAL_COMMUNITY)
Admission: EM | Admit: 2023-12-10 | Discharge: 2023-12-21 | DRG: 481 | Disposition: A | Discharge status: Home Health | Attending: Internal Medicine | Admitting: Internal Medicine

## 2023-12-10 ENCOUNTER — Emergency Department (HOSPITAL_COMMUNITY)

## 2023-12-10 ENCOUNTER — Encounter (HOSPITAL_COMMUNITY): Payer: Self-pay

## 2023-12-10 DIAGNOSIS — Z923 Personal history of irradiation: Secondary | ICD-10-CM | POA: Diagnosis not present

## 2023-12-10 DIAGNOSIS — Z808 Family history of malignant neoplasm of other organs or systems: Secondary | ICD-10-CM

## 2023-12-10 DIAGNOSIS — W010XXA Fall on same level from slipping, tripping and stumbling without subsequent striking against object, initial encounter: Secondary | ICD-10-CM | POA: Diagnosis present

## 2023-12-10 DIAGNOSIS — E785 Hyperlipidemia, unspecified: Secondary | ICD-10-CM

## 2023-12-10 DIAGNOSIS — E782 Mixed hyperlipidemia: Secondary | ICD-10-CM | POA: Diagnosis present

## 2023-12-10 DIAGNOSIS — S7291XA Unspecified fracture of right femur, initial encounter for closed fracture: Secondary | ICD-10-CM | POA: Diagnosis present

## 2023-12-10 DIAGNOSIS — M62830 Muscle spasm of back: Secondary | ICD-10-CM | POA: Diagnosis not present

## 2023-12-10 DIAGNOSIS — W19XXXA Unspecified fall, initial encounter: Principal | ICD-10-CM

## 2023-12-10 DIAGNOSIS — M80051A Age-related osteoporosis with current pathological fracture, right femur, initial encounter for fracture: Principal | ICD-10-CM | POA: Diagnosis present

## 2023-12-10 DIAGNOSIS — Z9011 Acquired absence of right breast and nipple: Secondary | ICD-10-CM | POA: Diagnosis not present

## 2023-12-10 DIAGNOSIS — Z822 Family history of deafness and hearing loss: Secondary | ICD-10-CM

## 2023-12-10 DIAGNOSIS — Z882 Allergy status to sulfonamides status: Secondary | ICD-10-CM

## 2023-12-10 DIAGNOSIS — R9431 Abnormal electrocardiogram [ECG] [EKG]: Secondary | ICD-10-CM | POA: Diagnosis not present

## 2023-12-10 DIAGNOSIS — Z853 Personal history of malignant neoplasm of breast: Secondary | ICD-10-CM | POA: Diagnosis not present

## 2023-12-10 DIAGNOSIS — E669 Obesity, unspecified: Secondary | ICD-10-CM

## 2023-12-10 DIAGNOSIS — Z9221 Personal history of antineoplastic chemotherapy: Secondary | ICD-10-CM | POA: Diagnosis not present

## 2023-12-10 DIAGNOSIS — E66811 Obesity, class 1: Secondary | ICD-10-CM | POA: Diagnosis not present

## 2023-12-10 DIAGNOSIS — S72331A Displaced oblique fracture of shaft of right femur, initial encounter for closed fracture: Secondary | ICD-10-CM

## 2023-12-10 DIAGNOSIS — S72001A Fracture of unspecified part of neck of right femur, initial encounter for closed fracture: Secondary | ICD-10-CM

## 2023-12-10 DIAGNOSIS — S72141A Displaced intertrochanteric fracture of right femur, initial encounter for closed fracture: Secondary | ICD-10-CM

## 2023-12-10 DIAGNOSIS — Z79899 Other long term (current) drug therapy: Secondary | ICD-10-CM

## 2023-12-10 DIAGNOSIS — D62 Acute posthemorrhagic anemia: Secondary | ICD-10-CM | POA: Diagnosis not present

## 2023-12-10 DIAGNOSIS — Y9222 Religious institution as the place of occurrence of the external cause: Secondary | ICD-10-CM

## 2023-12-10 DIAGNOSIS — R609 Edema, unspecified: Secondary | ICD-10-CM | POA: Diagnosis not present

## 2023-12-10 DIAGNOSIS — Z6830 Body mass index (BMI) 30.0-30.9, adult: Secondary | ICD-10-CM

## 2023-12-10 DIAGNOSIS — I951 Orthostatic hypotension: Secondary | ICD-10-CM | POA: Diagnosis not present

## 2023-12-10 DIAGNOSIS — M25551 Pain in right hip: Secondary | ICD-10-CM | POA: Diagnosis not present

## 2023-12-10 DIAGNOSIS — Z7983 Long term (current) use of bisphosphonates: Secondary | ICD-10-CM | POA: Diagnosis not present

## 2023-12-10 DIAGNOSIS — M25572 Pain in left ankle and joints of left foot: Secondary | ICD-10-CM | POA: Diagnosis not present

## 2023-12-10 DIAGNOSIS — Z83438 Family history of other disorder of lipoprotein metabolism and other lipidemia: Secondary | ICD-10-CM

## 2023-12-10 DIAGNOSIS — I10 Essential (primary) hypertension: Secondary | ICD-10-CM | POA: Diagnosis not present

## 2023-12-10 DIAGNOSIS — Z818 Family history of other mental and behavioral disorders: Secondary | ICD-10-CM | POA: Diagnosis not present

## 2023-12-10 DIAGNOSIS — Z800 Family history of malignant neoplasm of digestive organs: Secondary | ICD-10-CM | POA: Diagnosis not present

## 2023-12-10 HISTORY — PX: INTRAMEDULLARY (IM) NAIL INTERTROCHANTERIC: SHX5875

## 2023-12-10 LAB — COMPREHENSIVE METABOLIC PANEL WITH GFR
ALT: 35 U/L (ref 0–44)
AST: 33 U/L (ref 15–41)
Albumin: 3.7 g/dL (ref 3.5–5.0)
Alkaline Phosphatase: 56 U/L (ref 38–126)
Anion gap: 9 (ref 5–15)
BUN: 10 mg/dL (ref 8–23)
CO2: 25 mmol/L (ref 22–32)
Calcium: 9.2 mg/dL (ref 8.9–10.3)
Chloride: 106 mmol/L (ref 98–111)
Creatinine, Ser: 0.67 mg/dL (ref 0.44–1.00)
GFR, Estimated: >60 mL/min (ref >60)
Glucose, Bld: 118 mg/dL — ABNORMAL HIGH (ref 70–99)
Potassium: 3.8 mmol/L (ref 3.5–5.1)
Sodium: 140 mmol/L (ref 135–145)
Total Bilirubin: 0.7 mg/dL (ref 0.0–1.2)
Total Protein: 6.3 g/dL — ABNORMAL LOW (ref 6.5–8.1)

## 2023-12-10 LAB — CBC WITH DIFFERENTIAL/PLATELET
Abs Immature Granulocytes: 0.04 K/uL (ref 0.00–0.07)
Basophils Absolute: 0.1 K/uL (ref 0.0–0.1)
Basophils Relative: 1 %
Eosinophils Absolute: 0.2 K/uL (ref 0.0–0.5)
Eosinophils Relative: 3 %
HCT: 36 % (ref 36.0–46.0)
Hemoglobin: 11.6 g/dL — ABNORMAL LOW (ref 12.0–15.0)
Immature Granulocytes: 1 %
Lymphocytes Relative: 33 %
Lymphs Abs: 2.2 K/uL (ref 0.7–4.0)
MCH: 28.8 pg (ref 26.0–34.0)
MCHC: 32.2 g/dL (ref 30.0–36.0)
MCV: 89.3 fL (ref 80.0–100.0)
Monocytes Absolute: 0.7 K/uL (ref 0.1–1.0)
Monocytes Relative: 11 %
Neutro Abs: 3.4 K/uL (ref 1.7–7.7)
Neutrophils Relative %: 51 %
Platelets: 238 K/uL (ref 150–400)
RBC: 4.03 MIL/uL (ref 3.87–5.11)
RDW: 13.8 % (ref 11.5–15.5)
WBC: 6.7 K/uL (ref 4.0–10.5)
nRBC: 0 % (ref 0.0–0.2)

## 2023-12-10 LAB — SURGICAL PCR SCREEN
MRSA, PCR: NEGATIVE
Staphylococcus aureus: NEGATIVE

## 2023-12-10 LAB — TYPE AND SCREEN
ABO/RH(D): A POS
Antibody Screen: NEGATIVE

## 2023-12-10 LAB — PROTIME-INR
INR: 1 (ref 0.8–1.2)
Prothrombin Time: 13.4 s (ref 11.4–15.2)

## 2023-12-10 LAB — ABO/RH: ABO/RH(D): A POS

## 2023-12-10 SURGERY — FIXATION, FRACTURE, INTERTROCHANTERIC, WITH INTRAMEDULLARY ROD
Anesthesia: General | Laterality: Right

## 2023-12-10 MED ORDER — OXYCODONE HCL 5 MG PO TABS: 5.0000 mg | ORAL_TABLET | ORAL | Status: DC | PRN

## 2023-12-10 MED ORDER — MORPHINE SULFATE (PF) 2 MG/ML IV SOLN
2.0000 mg | INTRAVENOUS | Status: DC | PRN
  Administered 2023-12-10 – 2023-12-12 (×2): 2 mg via INTRAVENOUS
  Filled 2023-12-10 (×2): qty 1

## 2023-12-10 MED ORDER — ACETAMINOPHEN 10 MG/ML IV SOLN
INTRAVENOUS | Status: AC
  Filled 2023-12-10: qty 100

## 2023-12-10 MED ORDER — PROPOFOL 10 MG/ML IV BOLUS
INTRAVENOUS | Status: DC | PRN
Start: 2023-12-10 — End: 2023-12-10
  Administered 2023-12-10: 100 mg via INTRAVENOUS

## 2023-12-10 MED ORDER — HYDROMORPHONE HCL 1 MG/ML IJ SOLN
INTRAMUSCULAR | Status: AC
  Filled 2023-12-10: qty 1

## 2023-12-10 MED ORDER — STERILE WATER FOR IRRIGATION IR SOLN
Status: DC | PRN
Start: 2023-12-10 — End: 2023-12-10
  Administered 2023-12-10: 1000 mL

## 2023-12-10 MED ORDER — HYDROMORPHONE HCL 1 MG/ML IJ SOLN
0.2500 mg | INTRAMUSCULAR | Status: DC | PRN
  Administered 2023-12-10 (×3): 0.5 mg via INTRAVENOUS
  Administered 2023-12-10: 1 mg via INTRAVENOUS

## 2023-12-10 MED ORDER — SUGAMMADEX SODIUM 200 MG/2ML IV SOLN
INTRAVENOUS | Status: DC | PRN
  Administered 2023-12-10: 200 mg via INTRAVENOUS

## 2023-12-10 MED ORDER — ACETAMINOPHEN 650 MG RE SUPP
650.0000 mg | Freq: Four times a day (QID) | RECTAL | Status: DC | PRN
Start: 2023-12-10 — End: 2023-12-12

## 2023-12-10 MED ORDER — TRANEXAMIC ACID-NACL 1000-0.7 MG/100ML-% IV SOLN
1000.0000 mg | INTRAVENOUS | Status: AC
  Administered 2023-12-10: 1000 mg via INTRAVENOUS
  Filled 2023-12-10: qty 100

## 2023-12-10 MED ORDER — FENTANYL CITRATE (PF) 100 MCG/2ML IJ SOLN
INTRAMUSCULAR | Status: AC
  Filled 2023-12-10: qty 2

## 2023-12-10 MED ORDER — 0.9 % SODIUM CHLORIDE (POUR BTL) OPTIME
TOPICAL | Status: DC | PRN
  Administered 2023-12-10: 1000 mL

## 2023-12-10 MED ORDER — ONDANSETRON HCL 4 MG/2ML IJ SOLN
INTRAMUSCULAR | Status: DC | PRN
  Administered 2023-12-10: 4 mg via INTRAVENOUS

## 2023-12-10 MED ORDER — FENTANYL CITRATE PF 50 MCG/ML IJ SOSY
50.0000 ug | PREFILLED_SYRINGE | Freq: Once | INTRAMUSCULAR | Status: AC
  Administered 2023-12-10: 50 ug via INTRAVENOUS
  Filled 2023-12-10: qty 1

## 2023-12-10 MED ORDER — CHLORHEXIDINE GLUCONATE 0.12 % MT SOLN
15.0000 mL | Freq: Once | OROMUCOSAL | Status: AC
  Administered 2023-12-10: 15 mL via OROMUCOSAL

## 2023-12-10 MED ORDER — ENSURE SURGERY PO LIQD
237.0000 mL | Freq: Two times a day (BID) | ORAL | Status: DC
  Administered 2023-12-12 – 2023-12-19 (×11): 237 mL via ORAL
  Filled 2023-12-10 (×23): qty 237

## 2023-12-10 MED ORDER — CHLORHEXIDINE GLUCONATE 0.12 % MT SOLN
OROMUCOSAL | Status: AC
  Filled 2023-12-10: qty 15

## 2023-12-10 MED ORDER — SENNA 8.6 MG PO TABS
1.0000 | ORAL_TABLET | Freq: Two times a day (BID) | ORAL | Status: DC
  Administered 2023-12-11 – 2023-12-21 (×14): 8.6 mg via ORAL
  Filled 2023-12-10 (×21): qty 1

## 2023-12-10 MED ORDER — FENTANYL CITRATE (PF) 250 MCG/5ML IJ SOLN
INTRAMUSCULAR | Status: DC | PRN
  Administered 2023-12-10 (×2): 50 ug via INTRAVENOUS

## 2023-12-10 MED ORDER — ROCURONIUM BROMIDE 100 MG/10ML IV SOLN
INTRAVENOUS | Status: DC | PRN
Start: 2023-12-10 — End: 2023-12-10
  Administered 2023-12-10: 70 mg via INTRAVENOUS

## 2023-12-10 MED ORDER — MORPHINE SULFATE (PF) 2 MG/ML IV SOLN: 2.0000 mg | INTRAVENOUS | Status: AC | PRN

## 2023-12-10 MED ORDER — PHENYLEPHRINE 80 MCG/ML (10ML) SYRINGE FOR IV PUSH (FOR BLOOD PRESSURE SUPPORT)
PREFILLED_SYRINGE | INTRAVENOUS | Status: DC | PRN
  Administered 2023-12-10 (×3): 80 ug via INTRAVENOUS
  Administered 2023-12-10: 160 ug via INTRAVENOUS
  Administered 2023-12-10: 80 ug via INTRAVENOUS

## 2023-12-10 MED ORDER — CEFAZOLIN SODIUM-DEXTROSE 2-4 GM/100ML-% IV SOLN
2.0000 g | Freq: Four times a day (QID) | INTRAVENOUS | Status: AC
  Administered 2023-12-10 – 2023-12-11 (×3): 2 g via INTRAVENOUS
  Filled 2023-12-10 (×3): qty 100

## 2023-12-10 MED ORDER — ORAL CARE MOUTH RINSE: 15.0000 mL | Freq: Once | OROMUCOSAL | Status: AC

## 2023-12-10 MED ORDER — CEFAZOLIN SODIUM-DEXTROSE 2-4 GM/100ML-% IV SOLN
2.0000 g | INTRAVENOUS | Status: AC
  Administered 2023-12-10: 2 g via INTRAVENOUS
  Filled 2023-12-10: qty 100

## 2023-12-10 MED ORDER — TRANEXAMIC ACID-NACL 1000-0.7 MG/100ML-% IV SOLN
1000.0000 mg | Freq: Once | INTRAVENOUS | Status: AC
  Administered 2023-12-10: 1000 mg via INTRAVENOUS
  Filled 2023-12-10: qty 100

## 2023-12-10 MED ORDER — ONDANSETRON HCL 4 MG PO TABS: 4.0000 mg | ORAL_TABLET | Freq: Four times a day (QID) | ORAL | Status: DC | PRN

## 2023-12-10 MED ORDER — ACETAMINOPHEN 325 MG PO TABS
650.0000 mg | ORAL_TABLET | Freq: Four times a day (QID) | ORAL | Status: DC | PRN
Start: 2023-12-10 — End: 2023-12-12

## 2023-12-10 MED ORDER — ONDANSETRON HCL 4 MG/2ML IJ SOLN: 4.0000 mg | Freq: Four times a day (QID) | INTRAMUSCULAR | Status: DC | PRN

## 2023-12-10 MED ORDER — CEFAZOLIN SODIUM-DEXTROSE 2-4 GM/100ML-% IV SOLN
INTRAVENOUS | Status: AC
  Filled 2023-12-10: qty 100

## 2023-12-10 MED ORDER — OXYCODONE HCL 5 MG PO TABS
5.0000 mg | ORAL_TABLET | ORAL | Status: DC | PRN
  Administered 2023-12-11 – 2023-12-12 (×4): 10 mg via ORAL
  Administered 2023-12-15 – 2023-12-16 (×3): 5 mg via ORAL
  Administered 2023-12-17: 10 mg via ORAL
  Administered 2023-12-17: 5 mg via ORAL
  Administered 2023-12-18: 10 mg via ORAL
  Administered 2023-12-18 – 2023-12-19 (×3): 5 mg via ORAL
  Administered 2023-12-19 – 2023-12-21 (×4): 10 mg via ORAL
  Filled 2023-12-10: qty 1
  Filled 2023-12-10 (×3): qty 2
  Filled 2023-12-10: qty 1
  Filled 2023-12-10 (×3): qty 2
  Filled 2023-12-10: qty 1
  Filled 2023-12-10 (×4): qty 2
  Filled 2023-12-10: qty 1
  Filled 2023-12-10: qty 2
  Filled 2023-12-10: qty 1
  Filled 2023-12-10 (×3): qty 2

## 2023-12-10 MED ORDER — MORPHINE SULFATE (PF) 4 MG/ML IV SOLN
4.0000 mg | Freq: Once | INTRAVENOUS | Status: AC
  Administered 2023-12-10: 4 mg via INTRAVENOUS
  Filled 2023-12-10: qty 1

## 2023-12-10 MED ORDER — LACTATED RINGERS IV SOLN: INTRAVENOUS | Status: DC

## 2023-12-10 MED ORDER — DROPERIDOL 2.5 MG/ML IJ SOLN: 0.6250 mg | Freq: Once | INTRAMUSCULAR | Status: DC | PRN

## 2023-12-10 MED ORDER — POLYETHYLENE GLYCOL 3350 17 G PO PACK
17.0000 g | PACK | Freq: Every day | ORAL | Status: DC
  Administered 2023-12-11 – 2023-12-21 (×3): 17 g via ORAL
  Filled 2023-12-10 (×10): qty 1

## 2023-12-10 MED ORDER — PROPOFOL 10 MG/ML IV BOLUS
INTRAVENOUS | Status: AC
  Filled 2023-12-10: qty 20

## 2023-12-10 MED ORDER — DEXAMETHASONE SODIUM PHOSPHATE 10 MG/ML IJ SOLN
INTRAMUSCULAR | Status: DC | PRN
  Administered 2023-12-10: 5 mg via INTRAVENOUS

## 2023-12-10 MED ORDER — LIDOCAINE HCL (CARDIAC) PF 100 MG/5ML IV SOSY
PREFILLED_SYRINGE | INTRAVENOUS | Status: DC | PRN
Start: 2023-12-10 — End: 2023-12-10
  Administered 2023-12-10: 90 mg via INTRATRACHEAL

## 2023-12-10 MED ORDER — ACETAMINOPHEN 10 MG/ML IV SOLN
INTRAVENOUS | Status: DC | PRN
  Administered 2023-12-10: 1000 mg via INTRAVENOUS

## 2023-12-10 MED ORDER — ASPIRIN 81 MG PO CHEW
81.0000 mg | CHEWABLE_TABLET | Freq: Two times a day (BID) | ORAL | Status: DC
  Administered 2023-12-11 – 2023-12-21 (×21): 81 mg via ORAL
  Filled 2023-12-10 (×21): qty 1

## 2023-12-10 MED ORDER — ACETAMINOPHEN 500 MG PO TABS
1000.0000 mg | ORAL_TABLET | Freq: Three times a day (TID) | ORAL | Status: DC
  Administered 2023-12-11 – 2023-12-17 (×15): 1000 mg via ORAL
  Filled 2023-12-10 (×16): qty 2

## 2023-12-10 MED ORDER — PHENYLEPHRINE HCL-NACL 20-0.9 MG/250ML-% IV SOLN
INTRAVENOUS | Status: DC | PRN
  Administered 2023-12-10: 10 ug/min via INTRAVENOUS

## 2023-12-10 SURGICAL SUPPLY — 31 items
BIT DRILL INTERTAN LAG SCREW (BIT) IMPLANT
BIT DRILL LONG 4.0 (BIT) IMPLANT
BNDG COHESIVE 6X5 TAN NS LF (GAUZE/BANDAGES/DRESSINGS) ×1 IMPLANT
COVER PERINEAL POST (MISCELLANEOUS) ×1 IMPLANT
COVER SURGICAL LIGHT HANDLE (MISCELLANEOUS) ×1 IMPLANT
DRAPE C-ARM 42X72 X-RAY (DRAPES) ×1 IMPLANT
DRAPE C-ARMOR (DRAPES) ×1 IMPLANT
DRAPE STERI IOBAN 125X83 (DRAPES) ×1 IMPLANT
DRSG TEGADERM 4X4.75 (GAUZE/BANDAGES/DRESSINGS) ×5 IMPLANT
DURAPREP 26ML APPLICATOR (WOUND CARE) ×1 IMPLANT
GAUZE SPONGE 4X4 12PLY STRL (GAUZE/BANDAGES/DRESSINGS) ×1 IMPLANT
GAUZE XEROFORM 1X8 LF (GAUZE/BANDAGES/DRESSINGS) ×1 IMPLANT
GAUZE XEROFORM 5X9 LF (GAUZE/BANDAGES/DRESSINGS) IMPLANT
GLOVE INDICATOR 7.5 STRL GRN (GLOVE) ×1 IMPLANT
GLOVE SS BIOGEL STRL SZ 7.5 (GLOVE) ×1 IMPLANT
GOWN STRL SURGICAL XL XLNG (GOWN DISPOSABLE) ×1 IMPLANT
GUIDEROD BALL TIP 3.0X800 (ORTHOPEDIC DISPOSABLE SUPPLIES) IMPLANT
KIT BASIN OR (CUSTOM PROCEDURE TRAY) ×1 IMPLANT
KIT TURNOVER KIT B (KITS) ×1 IMPLANT
NAIL TRIGEN INTERTAN 10X18CM (Nail) IMPLANT
NS IRRIG 1000ML POUR BTL (IV SOLUTION) ×1 IMPLANT
PACK GENERAL/GYN (CUSTOM PROCEDURE TRAY) ×1 IMPLANT
PAD ARMBOARD POSITIONER FOAM (MISCELLANEOUS) ×1 IMPLANT
PIN GUIDE 3.2X343MM (PIN) IMPLANT
SCREW LAG COMPR KIT 90/85 (Screw) IMPLANT
SCREW TRIGEN LOW PROF 5.0X32.5 (Screw) IMPLANT
STAPLER VISISTAT (STAPLE) ×1 IMPLANT
SUT VIC AB 0 CT1 18XCR BRD8 (SUTURE) ×1 IMPLANT
SUT VIC AB 2-0 CT1 18 (SUTURE) ×1 IMPLANT
TRAY FOLEY W/BAG SLVR 16FR ST (SET/KITS/TRAYS/PACK) IMPLANT
WATER STERILE IRR 1000ML POUR (IV SOLUTION) ×1 IMPLANT

## 2023-12-10 NOTE — Op Note (Addendum)
 Orthopedic Surgery Operative Report   Procedure: Right intertrochanteric fracture open reduction internal fixation with cephalomedullary rod   Modifier: none   Date of procedure: 12/10/2023   Patient name: Marie Day MRN: [id number] DOB: May 14, 1946   Surgeon: Ozell Ada, MD Assistant: none Pre-operative diagnosis: right intertrochanteric hip fracture Post-operative diagnosis: same as above Findings: right intertrochanteric femur fracture   Specimens: none Anesthesia: general EBL: 150cc Complications: none Pre-incision antibiotic: ancef  TXA given prior to incision   Implants:  Implant Name Type Inv. Item Serial No. Manufacturer Lot No. LRB No. Used Action  NAIL LESTER GAILS [id number] - [id number] Nail NAIL LESTER GAILS [employer] [id number] Right 1 Implanted  SCREW LAG COMPR KIT 90/85 - [id number] Screw SCREW LAG COMPR KIT 90/85  [employer] [id number] Right 1 Implanted  SCREW TRIGEN LOW PROF 5.0X32.5 - [id number] Screw SCREW TRIGEN LOW PROF 5.0X32.5  [employer] [id number] Right 1 Implanted      Indication for procedure: Patient is a 46 female who presented to the ER after a ground level fall. The patient had right hip pain and x-rays revealed an intertrochanteric femur fracture. The patient was admitted to a medicine service with orthopedics consulted. I met the patient and discussed the fracture. I recommended operative management in the form of intramedullary rodding to stabilize the fracture and allow for mobilization. Explained the risks of this procedure included, but were not limited to: nonunion, malunion, fixation failure, infection, bleeding, stiffness, screw cut out, need for additional procedures, deep vein thrombosis, pulmonary embolism, MI, arrhythmia, and death. The alternatives of this surgery would be to treat the fracture with traction or to perform no intervention. After our discussion, patient  elected to proceed with surgery.    Procedure Description: The patient was met in the pre-operative holding area. The patient's identity and consent were verified. The operative site was marked by myself. The patient's remaining questions about the surgery were answered. The patient was brought back to the operating room. General anesthesia was induced and an endotracheal tube was placed by the anesthesia staff. The patient was transferred to the [facility] table. All bony prominences were well padded. Traction was applied and reduction was attempted with manipulation. Fluoroscopy confirmed a satisfactory reduction. The surgical area was cleansed with alcohol. Ancef  and TXA were administered by anesthesia. The patient's skin was then prepped and draped in a standard, sterile fashion. A time out was performed that identified the patient, the procedure, and the operative site. All team members agreed with what was stated in the time out.    An incision was made just proximal and inferior to the greater trochanter. The incision was taken sharply down through the fascia. A guide pin was inserted into the wound onto the top of the greater trochanter. Fluoroscopy was used to place the guide pin at the starting point at the tip of the greater trochanter and in line with the middle of the femoral neck. The wire was then advanced to a point just past the lesser trochanter. A soft tissue sleeve was advanced over the wire onto the greater trochanter. An entry reamer was used to open the proximal femoral canal under fluoroscopic guidance. The pin and reamer were removed. A long guide wire was placed down the femoral canal. A 10x18cm rod was advanced over the guidewire under fluoroscopic guidance. The guidewire was removed.    An incision was made sharply through the skin, dermis, and fascia over the lateral thigh  in the area where the lag screws would be inserted. The lag screw targeter was placed through the jig onto the  lateral femoral cortex. A guide wire was advanced through the lag screw targeter into the femoral head under fluoroscopic guidance. It was found to be in acceptable position on the AP and lateral views. The length of the lag screw was estimated off of the guide wire. A 90mm screw was selected. The inferior lag screw was drilled through the guide. The derotation device was placed through the targeter. The proximal lag screw hole was then drilled over the guide wire. The screw was inserted over the wire under fluoroscopic guidance. The derotation bar was removed and the inferior lag screw was inserted. AP and lateral fluoroscopic images confirmed satisfactory position of the screws in the femoral head.   An incision was made over the distal interlocking screw of the nail. Incision was taken sharply down through the skin, dermis, and fascia. A targeter was placed through the jig onto the lateral femoral cortex. A drill was used to drill the femur bicortically through the nail. The length of the screw was estimated off the drill. A 32.46mm screw was selected and inserted through the femur and distal hole of the nail. The jig was removed from the nail. Final AP and lateral fluoroscopic images confirmed satisfactory reduction and position of the fixation.    The wounds were copiously irrigated with sterile saline. The fascia was closed with 0 vicryl. The deep dermal layer was closed with 2-0 vicryl. The skin was closed with staples. Dressings were applied. All counts were correct at the end of the case. Patient was transferred back to a hospital bed. The patient was awakened from anesthesia and brought back to the post-anesthesia care unit in stable condition.     Post-operative plan: The patient will recover in the post-anesthesia care unit and then go to the floor on the medicine service. The patient will receive two post-operative doses of ancef . She will get another dose of TXA as well. The patient will be weight  bearing as tolerated. The patient will work with physical therapy. The patient's disposition will be determined by the medicine service.        Ozell Ada, MD Orthopedic Surgeon

## 2023-12-10 NOTE — Anesthesia Preprocedure Evaluation (Addendum)
 Anesthesia Evaluation  Patient identified by MRN, date of birth, ID band Patient awake    Reviewed: Allergy & Precautions, NPO status , Patient's Chart, lab work & pertinent test results  Airway Mallampati: II  TM Distance: >3 FB Neck ROM: Full    Dental no notable dental hx.    Pulmonary neg pulmonary ROS   Pulmonary exam normal breath sounds clear to auscultation       Cardiovascular negative cardio ROS Normal cardiovascular exam Rhythm:Regular Rate:Normal  Echo 08/2021  1. Left ventricular ejection fraction, by estimation, is 55 to 60%. The left ventricle has normal function. The left ventricle has no regional wall motion abnormalities. Left ventricular diastolic parameters are consistent with Grade I diastolic dysfunction (impaired relaxation). The average left ventricular global longitudinal strain is -18.8%. The global longitudinal strain is normal.   2. Right ventricular systolic function is normal. The right ventricular size is normal. There is normal pulmonary artery systolic pressure. The estimated right ventricular systolic pressure is 21.8 mmHg.   3. The mitral valve is normal in structure. No evidence of mitral valve regurgitation. No evidence of mitral stenosis.   4. The aortic valve is tricuspid. Aortic valve regurgitation is not visualized. Aortic valve sclerosis is present, with no evidence of aortic valve stenosis. Aortic valve Vmax measures 1.36 m/s.   5. The inferior vena cava is normal in size with greater than 50% respiratory variability, suggesting right atrial pressure of 3 mmHg.   6. Compared to prior echo dated 04/30/2021, LV strain has increased from -21.3% to -18.8% but still in normal range.     Neuro/Psych  Neuromuscular disease    GI/Hepatic negative GI ROS, Neg liver ROS,neg GERD  ,,  Endo/Other  negative endocrine ROS    Renal/GU negative Renal ROS     Musculoskeletal  (+) Arthritis ,     Abdominal  (+) + obese  Peds  Hematology  (+) Blood dyscrasia, anemia   Anesthesia Other Findings   Reproductive/Obstetrics                              Anesthesia Physical Anesthesia Plan  ASA: 3  Anesthesia Plan: General   Post-op Pain Management: Tylenol  PO (pre-op)*   Induction: Intravenous  PONV Risk Score and Plan: 4 or greater and Ondansetron , Dexamethasone  and Treatment may vary due to age or medical condition  Airway Management Planned: Oral ETT  Additional Equipment:   Intra-op Plan:   Post-operative Plan: Extubation in OR  Informed Consent: I have reviewed the patients History and Physical, chart, labs and discussed the procedure including the risks, benefits and alternatives for the proposed anesthesia with the patient or authorized representative who has indicated his/her understanding and acceptance.     Dental advisory given  Plan Discussed with: CRNA  Anesthesia Plan Comments:          Anesthesia Quick Evaluation

## 2023-12-10 NOTE — Progress Notes (Signed)
 Orthopedic Surgery Progress Note   Assessment: Patient is a 78 y.o. female with right intertrochanteric femur fracture status post CMN   Plan: -Operative plans: complete -Diet: regular -DVT ppx: aspirin  81mg  BID -Antibiotics: ancef  x2 post-op doses -Weight bearing status: as tolerated -PT evaluate and treat -Pain control -Dispo: to floor from PACU  ___________________________________________________________________________  Subjective: No acute events since surgery. Having pain around her hip but medications are helping.    Physical Exam:  General: no acute distress, appears stated age Neurologic: alert, answering questions appropriately, following commands Respiratory: unlabored breathing on room air, symmetric chest rise Psychiatric: appropriate affect, normal cadence to speech  MSK:   -Right lower extremity  Dressings over hip c/d/i EHL/TA/GSC intact Plantarflexes and dorsiflexes toes Sensation intact to light touch in sural, saphenous, tibial, deep peroneal, and superficial peroneal nerve distributions Foot warm and well perfused   Patient name: Marie Day Patient MRN: [id number] Date: 12/10/23

## 2023-12-10 NOTE — Progress Notes (Signed)
 Transition of Care [facility]) - CAGE-AID Screening   Patient Details  Name: Marie Day MRN: [id number] Date of Birth: 15-Mar-1946   Darice CHRISTELLA Rouleau, RN Trauma Response Nurse Phone Number: [phone] 12/10/2023, 1:36 PM   CAGE-AID Screening:    Have You Ever Felt You Ought to Cut Down on Your Drinking or Drug Use?: No Have People Annoyed You By Critizing Your Drinking Or Drug Use?: No Have You Felt Bad Or Guilty About Your Drinking Or Drug Use?: No Have You Ever Had a Drink or Used Drugs First Thing In The Morning to Steady Your Nerves or to Get Rid of a Hangover?: No CAGE-AID Score: 0  Substance Abuse Education Offered: (S) No (Pt denies alcohol use- no services needed)

## 2023-12-10 NOTE — Transfer of Care (Signed)
 Immediate Anesthesia Transfer of Care Note  Patient: Marie Day  Procedure(s) Performed: FIXATION, FRACTURE, INTERTROCHANTERIC, WITH INTRAMEDULLARY ROD (Right)  Patient Location: PACU  Anesthesia Type:General  Level of Consciousness: awake and alert   Airway & Oxygen Therapy: Patient Spontanous Breathing and Patient connected to nasal cannula oxygen  Post-op Assessment: Report given to RN and Post -op Vital signs reviewed and stable  Post vital signs: Reviewed and stable  Last Vitals:  Vitals Value Taken Time  BP 135/78 12/10/23 17:53  Temp    Pulse 81 12/10/23 17:54  Resp 12 12/10/23 17:54  SpO2 93 % 12/10/23 17:54  Vitals shown include unfiled device data.  Last Pain:  Vitals:   12/10/23 1507  TempSrc:   PainSc: 8          Complications: No notable events documented.

## 2023-12-10 NOTE — Discharge Instructions (Addendum)
 Orthopedic Surgery Discharge Instructions  Patient name: Marie Day Fracture: right intertrochanteric femur fracture Procedure Performed: right hip cephalomedullary rodding Date of Surgery: 12/10/2023 Surgeon: Ozell Ada, MD  Activity: You are allowed to put as much weight on your leg as you would like. You can walk as much as you would like. You can perform household activities such as cleaning dishes, doing laundry, vacuuming, etc.  Incision Care: Your incision site has a dressing over it. That dressing should remain in place and dry at all times for a total of one week after surgery. After one week, you can remove the dressing. Underneath the dressing, you will find skin staples. You should leave these staples in place. They will be taken out in the office when the wound has healed. Do not pick, rub, or scrub at them. Do not put cream or lotion over the surgical area. After one week and once the dressing is off, it is okay to let soap and water  run over your incision. Again, do not pick, scrub, or rub at the staples when bathing. Do not submerge (e.g., take a bath, swim, go in a hot tub, etc.) until six weeks after surgery. There may be some bloody drainage from the incision into the dressing after surgery. This is normal. You do not need to replace the dressing. Continue to leave it in place for the one week as instructed above. Should the dressing become saturated with blood or drainage, please call the office for further instructions.   Medications: You have been prescribed oxycodone . This is a narcotic pain medication and should only be taken as prescribed. You should not drink alcohol or operate heavy machinery (including driving) while taking this medication. The oxycodone  can cause constipation as a side effect. For that reason, you have been prescribed senna and miralax . These are both laxatives. You do not need to take this medication if you develop diarrhea. Should you remain  constipated even while taking the senna and miralax , please use the miralax  twice daily. Tylenol  has been prescribed to be taken every 8 hours, which will give you additional pain relief.   You have been prescribed aspirin  as a blood thinner. This medication is to be taken to prevent blood clots. Take 81 milligrams twice daily. You should refrain from using other blood thinners (warfarin, apixaban, plavix, xarelto, etc.) while using the aspirin . You will need to take this medication for a total of 6 weeks after your surgery.   You should not use over-the-counter NSAIDs (ibuprofen, Aleve, Celebrex, naproxen, meloxicam, etc.) for pain relief because aspirin  is a similar medication. There can be side effects including but not limited to kidney injury and ulcers if you take these type of medications with the aspirin .  In order to set expectations for opioid prescriptions, you will only be prescribed opioids for a total of six weeks after surgery and, at two-weeks after surgery, your opioid prescription will start to tapered (decreased dosage and number of pills). If you have ongoing need for opioid medication six weeks after surgery, you will be referred to pain management. If you are already established with a provider that is giving you opioid medications, you should schedule an appointment with them for six weeks after surgery if you feel you are going to need another prescription. State law only allows for opioid prescriptions one week at a time. If you are running out of opioid medication near the end of the week, please call the office during business hours before  running out so I can send you another prescription.   Driving: You should not drive while taking narcotic pain medications. You should start getting back to driving slowly and you may want to try driving in a parking lot before doing anything more.   Diet: You are safe to resume your regular diet after surgery.   Reasons to Call the Office  After Surgery: You should feel free to call the office with any concerns or questions you have in the post-operative period, but you should definitely notify the office if you develop: -shortness of breath, chest pain, or trouble breathing -excessive bleeding, drainage, redness, or swelling around the surgical site -fevers, chills, or pain that is getting worse with each passing day -persistent nausea or vomiting -new weakness in the right leg, new or worsening numbness or tingling in the right leg -other concerns about your surgery  Follow Up Appointments: You have an office appointment scheduled with Dr. Georgina on 12/27/2023 at 9:45am. The office location and phone number are listed below.   Office Information:  -Ozell Georgina, MD -Phone number: [phone] -Address: [address]       [place], [place] [address]

## 2023-12-10 NOTE — Consult Note (Addendum)
 Orthopedic Surgery Consult Note  Assessment: Patient is a 78 y.o. female with right intertrochanteric femur fracture   Plan: -Planning for operative fixation today -Diet: NPO for procedure today -DVT ppx: aspirin  81mg  BID post-operatively -Antibiotics: ancef  on call to OR -Weight bearing status: NWB RLE -PT evaluate and treat post-operatively -Pain control -Dispo: admit to medicine   Discussed recommendation for operative intervention in the form of right intertrochanteric femur fracture open reduction internal fixation with cephalomedullary rod. Explained the risks of this procedure included, but were not limited to: nonunion, malunion, hardware failure, infection, bleeding, stiffness, screw cut out, periprosthetic fracture, need for additional procedures, deep vein thrombosis, pulmonary embolism, and death. The benefits of this procedure would be to promote fracture healing by providing stability and to allow for early weight bearing and mobilization. The alternatives of this surgery would be to treat the fracture with immobilization in traction or to do no intervention. The patient's questions were answered to her satisfaction. After this discussion, patient elected to proceed with surgery. Informed consent was obtained.   ___________________________________________________________________________   Reason for consult: right intertrochanteric femur fracture  History:  Patient is a 78 y.o. female who had a fall at church earlier today. She noted immediate onset of right hip pain. She was unable to weight bear and was brought to [facility] ER. She was found to have a hip fracture. Orthopedics was consulted and she was admitted to medicine. She is having right hip pain. No pain elsewhere.   Review of systems: General: denies fevers and chills, myalgias Neurologic: denies recent changes in vision, slurred speech Abdomen: denies nausea, vomiting, hematemesis Respiratory: denies cough,  shortness of breath  Past medical history:  Breast cancer Chemotherapy-related neuropathy Insomnia Osteoporosis (on fosamax ) Mixed hyperlipidemia Chronic anemia  Allergies: sulfa   Past surgical history:  Right breast mastectomy Breast reconstruction  Social history: Denies use of nicotine-containing products (cigarettes, vaping, smokeless, etc.) Alcohol use: denies Denies use of recreational drugs  Family history: -reviewed and not pertinent to intertrochanteric femur fracture   Physical Exam:  BMI of 30.4  General: no acute distress, appears stated age Neurologic: alert, answering questions appropriately, following commands Cardiovascular: regular rate, no cyanosis Respiratory: unlabored breathing on room air, symmetric chest rise Psychiatric: appropriate affect, normal cadence to speech  MSK:   -Right lower extremity  Pain with log roll, leg shorter when compared to contralateral leg, TTP around the hip, no other tenderness to palpation over the remainder of the extremity, no open wounds Fires quadriceps, hamstrings, tibialis anterior, gastrocnemius and soleus, extensor hallucis longus. Does not fire hip flexors due to pain Plantarflexes and dorsiflexes toes Sensation intact to light touch in sural, saphenous, tibial, deep peroneal, and superficial peroneal nerve distributions Foot warm and well perfused  Imaging: XRs of the right femur from 12/10/2023 was independently reviewed and interpreted, showing a displaced and shortened intertrochanteric femur fracture. No dislocation. No other fractures seen.    Patient name: Marie Day Patient MRN: [id number] Date: 12/10/23

## 2023-12-10 NOTE — Plan of Care (Signed)
   Problem: Education: Goal: Knowledge of General Education information will improve Description: Including pain rating scale, medication(s)/side effects and non-pharmacologic comfort measures Outcome: Progressing   Problem: Activity: Goal: Risk for activity intolerance will decrease Outcome: Progressing   Problem: Pain Managment: Goal: General experience of comfort will improve and/or be controlled Outcome: Progressing

## 2023-12-10 NOTE — ED Provider Notes (Signed)
 [place] [facility] DE[facility]MENT AT [facility] Provider Note   CSN: [id number] Arrival date & time: 12/10/23  1107     Patient presents with: Marie Day is a 78 y.o. female.   The history is provided by the patient and the EMS personnel. No language interpreter was used.  Fall This is a new problem. The current episode started less than 1 hour ago. The problem occurs rarely. The problem has not changed since onset.Pertinent negatives include no chest pain, no abdominal pain, no headaches and no shortness of breath. The symptoms are aggravated by walking and standing. She has tried nothing for the symptoms. The treatment provided no relief.       Prior to Admission medications   Medication Sig Start Date End Date Taking? Authorizing Provider  alendronate  (FOSAMAX ) 70 MG tablet TAKE 1 TABLET (70 MG TOTAL) BY MOUTH ONCE A WEEK. TAKE WITH A FULL GLASS OF WATER  ON AN EMPTY STOMACH. 11/23/23   Odean Potts, MD  Cholecalciferol (D3 HIGH POTENCY PO)     [provider]  Glucosamine-Chondroitin (OSTEO BI-FLEX REGULAR STRENGTH PO) Take by mouth in the morning and at bedtime. Patient not taking: Reported on 10/10/2023    [provider]  Omega 3 1000 MG CAPS     [provider]  tiZANidine  (ZANAFLEX ) 2 MG tablet Take 1-2 tablets (2-4 mg total) by mouth 2 (two) times daily as needed for muscle spasms. 09/27/23   Arloa Suzen RAMAN, NP  traZODone  (DESYREL ) 50 MG tablet TAKE 0.5-1 TABLETS BY MOUTH AT BEDTIME AS NEEDED FOR SLEEP. 10/12/23   Kennyth Worth [name], MD  Turmeric 500 MG CAPS     [provider]    Allergies: Sulfa antibiotics    Review of Systems  Constitutional:  Negative for chills, fatigue and fever.  HENT:  Negative for congestion.   Respiratory:  Negative for cough, chest tightness, shortness of breath and wheezing.   Cardiovascular:  Negative for chest pain and palpitations.  Gastrointestinal:  Negative for abdominal pain,  constipation, diarrhea, nausea and vomiting.  Genitourinary:  Negative for dysuria.  Musculoskeletal:  Negative for back pain, neck pain and neck stiffness.  Skin:  Negative for rash and wound.  Neurological:  Negative for weakness, light-headedness, numbness and headaches.  Psychiatric/Behavioral:  Negative for agitation and confusion.   All other systems reviewed and are negative.   Updated Vital Signs There were no vitals taken for this visit.  Physical Exam Vitals and nursing note reviewed.  Constitutional:      General: She is not in acute distress.    Appearance: She is well-developed. She is not ill-appearing, toxic-appearing or diaphoretic.  HENT:     Head: Normocephalic and atraumatic.     Nose: No congestion or rhinorrhea.     Mouth/Throat:     Mouth: Mucous membranes are moist.     Pharynx: No oropharyngeal exudate or posterior oropharyngeal erythema.  Eyes:     Extraocular Movements: Extraocular movements intact.     Conjunctiva/sclera: Conjunctivae normal.     Pupils: Pupils are equal, round, and reactive to light.  Cardiovascular:     Rate and Rhythm: Normal rate and regular rhythm.     Heart sounds: No murmur heard. Pulmonary:     Effort: Pulmonary effort is normal. No respiratory distress.     Breath sounds: Normal breath sounds. No wheezing, rhonchi or rales.  Chest:     Chest wall: No tenderness.  Abdominal:  Palpations: Abdomen is soft.     Tenderness: There is no abdominal tenderness. There is no guarding or rebound.  Musculoskeletal:        General: Tenderness and deformity present. No swelling.     Cervical back: Neck supple.     Right hip: Deformity and tenderness present.     Right lower leg: No edema.     Left lower leg: No edema.       Legs:     Comments: Tenderness and deformity to the right hip and right upper thigh.  Leg length difference with shortened right leg.  Intact sensation strength and pulse distally.  Skin:    General: Skin is  warm and dry.     Capillary Refill: Capillary refill takes less than 2 seconds.     Findings: No erythema or rash.  Neurological:     General: No focal deficit present.     Mental Status: She is alert.     Sensory: No sensory deficit.     Motor: No weakness.  Psychiatric:        Mood and Affect: Mood normal.     (all labs ordered are listed, but only abnormal results are displayed) Labs Reviewed  CBC WITH DIFFERENTIAL/PLATELET - Abnormal; Notable for the following components:      Result Value   Hemoglobin 11.6 (*)    All other components within normal limits  COMPREHENSIVE METABOLIC PANEL WITH GFR - Abnormal; Notable for the following components:   Glucose, Bld 118 (*)    Total Protein 6.3 (*)    All other components within normal limits  SURGICAL PCR SCREEN  PROTIME-INR  CBC WITH DIFFERENTIAL/PLATELET  BASIC METABOLIC PANEL WITH GFR  CBC  TYPE AND SCREEN  ABO/RH    EKG: None  Radiology: DG HIP UNILAT WITH PELVIS 2-3 VIEWS RIGHT Result Date: 12/10/2023 CLINICAL DATA:  Postop. EXAM: DG HIP (WITH OR WITHOUT PELVIS) 2-3V RIGHT COMPARISON:  Preoperative imaging FINDINGS: Femoral intramedullary nail with trans trochanteric and distal locking screw fixation traversing proximal femur fracture. Persistent displacement of lesser trochanteric fragment. Recent postsurgical change includes air and edema in the soft tissues with overlying skin staples in place. IMPRESSION: ORIF of proximal femur fracture. Electronically Signed   By: Andrea Gasman M.D.   On: 12/10/2023 18:43   DG HIP UNILAT WITH PELVIS 2-3 VIEWS RIGHT Result Date: 12/10/2023 CLINICAL DATA:  Elective surgery EXAM: DG HIP (WITH OR WITHOUT PELVIS) 2-3V RIGHT COMPARISON:  Preoperative imaging FINDINGS: Seven fluoroscopic spot views of the right hip submitted from the operating room. Femoral intramedullary nail with trans trochanteric and distal locking screw fixation traversing proximal femur fracture. Fluoroscopy time 1  minutes 57 seconds. Dose 34.89 mGy. IMPRESSION: Intraoperative fluoroscopy during proximal femur fracture fixation. Electronically Signed   By: Andrea Gasman M.D.   On: 12/10/2023 18:42   DG C-Arm 1-60 Min-No Report Result Date: 12/10/2023 Fluoroscopy was utilized by the requesting physician.  No radiographic interpretation.   DG C-Arm 1-60 Min-No Report Result Date: 12/10/2023 Fluoroscopy was utilized by the requesting physician.  No radiographic interpretation.   DG Pelvis 1-2 Views Result Date: 12/10/2023 CLINICAL DATA:  Fall, right hip fracture EXAM: PELVIS - 1-2 VIEW COMPARISON:  None Available. FINDINGS: There is an acute intratrochanteric fracture of the right hip with superior displacement, override and external rotation of the distal femoral shaftl and avulsion of the lesser trochanter with 12 mm distraction of the fracture fragment. Femoral head is still seated within the right acetabulum  and joint space is preserved. Pelvis and visualized left hip are intact. Soft tissues unremarkable. IMPRESSION: 1. Acute intratrochanteric fracture of the right hip. Electronically Signed   By: Dorethia Molt M.D.   On: 12/10/2023 12:02   DG Chest Portable 1 View Result Date: 12/10/2023 CLINICAL DATA:  Fall, right hip fracture. EXAM: PORTABLE CHEST 1 VIEW COMPARISON:  None Available. FINDINGS: Lungs are well expanded, symmetric, and clear. No pneumothorax or pleural effusion. Cardiac size within normal limits. Pulmonary vascularity is normal. Surgical clips noted within the right axilla. Osseous structures are age-appropriate. No acute bone abnormality. IMPRESSION: No active disease. Electronically Signed   By: Dorethia Molt M.D.   On: 12/10/2023 12:01   DG Femur Min 2 Views Right Result Date: 12/10/2023 CLINICAL DATA:  Fall, right hip pain EXAM: RIGHT FEMUR 2 VIEWS COMPARISON:  None Available. FINDINGS: There is an acute intratrochanteric fracture of the right femoral neck with avulsion of the lesser  trochanter by approximately 15 mm. Mild distraction of the major fracture fragments by approximately 5 mm. Major fracture fragments are normally aligned. Femoral head is still seated within the right acetabulum and right hip joint space is preserved. Distal femoral shaft is intact. Visualized right hemipelvis is intact. IMPRESSION: 1. Acute intratrochanteric fracture of the right femoral neck. Electronically Signed   By: Dorethia Molt M.D.   On: 12/10/2023 12:00     Procedures   Medications Ordered in the ED  acetaminophen  (TYLENOL ) tablet 650 mg ( Oral MAR Unhold 12/10/23 1839)    Or  acetaminophen  (TYLENOL ) suppository 650 mg ( Rectal MAR Unhold 12/10/23 1839)  oxyCODONE  (Oxy IR/ROXICODONE ) immediate release tablet 5 mg ( Oral MAR Unhold 12/10/23 1839)  morphine  (PF) 2 MG/ML injection 2 mg ( Intravenous MAR Unhold 12/10/23 1839)  ondansetron  (ZOFRAN ) tablet 4 mg ( Oral MAR Unhold 12/10/23 1839)    Or  ondansetron  (ZOFRAN ) injection 4 mg ( Intravenous MAR Unhold 12/10/23 1839)  acetaminophen  (TYLENOL ) tablet 1,000 mg (has no administration in time range)  feeding supplement (ENSURE SURGERY) liquid 237 mL (has no administration in time range)  ceFAZolin  (ANCEF ) IVPB 2g/100 mL premix (has no administration in time range)  tranexamic acid  (CYKLOKAPRON ) IVPB 1,000 mg (has no administration in time range)  oxyCODONE  (Oxy IR/ROXICODONE ) immediate release tablet 5-10 mg (has no administration in time range)  morphine  (PF) 2 MG/ML injection 2 mg (has no administration in time range)  senna (SENOKOT) tablet 8.6 mg (has no administration in time range)  polyethylene glycol (MIRALAX  / GLYCOLAX ) packet 17 g (has no administration in time range)  aspirin  chewable tablet 81 mg (has no administration in time range)  fentaNYL  (SUBLIMAZE ) injection 50 mcg (50 mcg Intravenous Given 12/10/23 1122)  ceFAZolin  (ANCEF ) IVPB 2g/100 mL premix (2 g Intravenous New Bag/Given 12/10/23 1342)  tranexamic acid  (CYKLOKAPRON )  IVPB 1,000 mg (0 mg Intravenous Stopped 12/10/23 1400)  morphine  (PF) 4 MG/ML injection 4 mg (4 mg Intravenous Given 12/10/23 1313)  chlorhexidine  (PERIDEX ) 0.12 % solution 15 mL (15 mLs Mouth/Throat Given 12/10/23 1528)    Or  Oral care mouth rinse ( Mouth Rinse See Alternative 12/10/23 1528)                                    Medical Decision Making Amount and/or Complexity of Data Reviewed Labs: ordered. Radiology: ordered.  Risk Prescription drug management. Decision regarding hospitalization.    Marie Day is  a 78 y.o. female with a past medical history significant for previous breast cancer who presents with fall.  According to EMS and patient, she was at church and was walking when her leg got caught and tripped up in a pew causing her to fall down.  She reports she landed on her right hip.  She did not hit her head her back with the rest of her body.  She is only complaining of right hip pain.  She tried to roll and felt a crunch and is unable to bear weight.  EMS brought her here and she did not need any pain medicine en route.  Patient does not take any blood thinners and denies any preceding symptoms.  She denies any fevers, chills, congestion, cough, nausea, vomiting, constipation, diarrhea, or urinary changes.  On my exam, patient has tenderness in her right hip and right lateral and anterior thigh.  Knee nontender, ankle nontender.  Abdomen and back nontender.  Lungs clear and chest nontender.  Clinically I suspect hip fracture.  Will get brace free admission labs and chest x-ray and EKG.  Will order some pain medicine for her.  Anticipate admission for hip fracture if this is what is seen on the images.  12:36 PM X-rays returned with evidence of right hip fracture.  Spoke with Dr. Georgina with orthopedics who requested the patient remain n.p.o. as she might be able to have an operation today.  He will come see her.  He requested medicine admission and we will call for  medical admission.       Final diagnoses:  Fall, initial encounter  Closed fracture of right hip, initial encounter [facility])   Clinical Impression: 1. Fall, initial encounter   2. Closed fracture of right hip, initial encounter [facility])     Disposition: Admit  This note was prepared with assistance of Dragon voice recognition software. Occasional wrong-word or sound-a-like substitutions may have occurred due to the inherent limitations of voice recognition software.     Madysun Thall, Lonni PARAS, MD 12/10/23 2020

## 2023-12-10 NOTE — ED Triage Notes (Signed)
 Pt to er room number 6 via ems, per ems pt was at church and tripped and fell, pt c/o R hip pain, R leg is slightly shorter, md at bedside.

## 2023-12-10 NOTE — Plan of Care (Signed)
   Problem: Activity: Goal: Risk for activity intolerance will decrease Outcome: Progressing   Problem: Coping: Goal: Level of anxiety will decrease Outcome: Progressing   Problem: Safety: Goal: Ability to remain free from injury will improve Outcome: Progressing

## 2023-12-10 NOTE — Anesthesia Procedure Notes (Addendum)
 Procedure Name: Intubation Date/Time: 12/10/2023 4:06 PM  Performed by: Virgil Ee, CRNAPre-anesthesia Checklist: Patient identified, Patient being monitored, Timeout performed, Emergency Drugs available and Suction available Patient Re-evaluated:Patient Re-evaluated prior to induction Oxygen Delivery Method: Circle system utilized Preoxygenation: Pre-oxygenation with 100% oxygen Induction Type: IV induction Ventilation: Mask ventilation without difficulty Laryngoscope Size: Mac and 4 Grade View: Grade I Tube type: Oral Tube size: 7.0 mm Number of attempts: 1 Airway Equipment and Method: Stylet Placement Confirmation: ETT inserted through vocal cords under direct vision, positive ETCO2 and breath sounds checked- equal and bilateral Secured at: 21 cm Tube secured with: Tape Dental Injury: Teeth and Oropharynx as per pre-operative assessment

## 2023-12-10 NOTE — H&P (Signed)
 History and Physical    Marie Day FMW:[id number] DOB: 1946/01/03 DOA: 12/10/2023  PCP: Kennyth Worth [name], MD  Patient coming from: Home  I have personally briefly reviewed patient's old medical records in [facility]  Chief Complaint:   HPI: Marie Day is a 78 y.o. female with medical history significant of right breast cancer status postmastectomy and chemoradiation therapy, osteoporosis on Fosamax  brought by EMS after she tripped and fell and landed on her right hip while she was at church.  She denies any head injury, loss of consciousness, seizure-like episode, lightheadedness, dizziness.  Does have a history of osteoporosis for which she has been on Fosamax  for over a year..  No fever, chills, nausea vomiting diarrhea, UTI symptoms.  Lives with her husband at home.  No history of tobacco abuse, alcohol abuse, illicit drug use.  ED Course: Upon arrival to ED: Vital stable.  No leukocytosis, H&H 11.6/36.0, PLT 238.  NA 140, K: 3.8, BUN 10, BG: 118, creatinine 0.67.  Chest x-ray negative.  Right hip x-ray concerning for acute intertrochanteric fracture of the right femoral neck.  EDP consulted orthopedic who recommended n.p.o. and possible procedure today.  [facility] hospitalist consulted for admission.  Review of Systems: As per HPI otherwise negative.    Past Medical History:  Diagnosis Date   Arthritis    Breast cancer (HCC)    Cancer [facility])     Past Surgical History:  Procedure Laterality Date   BREAST BIOPSY Right 01/30/2023   US  [name] BX W LOC DEV 1ST LESION IMG BX SPEC US  GUIDE 01/30/2023 [facility] MAMMOGRAPHY   BREAST RECONSTRUCTION WITH PLACEMENT OF TISSUE EXPANDER AND FLEX HD (ACELLULAR HYDRATED DERMIS) Right 06/29/2021   Procedure: RIGHT BREAST RECONSTRUCTION WITH PLACEMENT OF TISSUE EXPANDER AND FLEX HD (ACELLULAR HYDRATED DERMIS);  Surgeon: Elisabeth Craig RAMAN, MD;  Location: [facility];  Service: Plastics;  Laterality: Right;   BREAST REDUCTION WITH  MASTOPEXY Left 06/30/2022   Procedure: LEFT BREAST REDUCTION WITH MASTOPEXY;  Surgeon: Lowery Estefana RAMAN, DO;  Location: [facility];  Service: Plastics;  Laterality: Left;   MASTECTOMY Right 06/29/2021   MASTECTOMY W/ SENTINEL NODE BIOPSY Right 06/29/2021   Procedure: RIGHT MASTECTOMY WITH SENTINEL LYMPH NODE BIOPSY;  Surgeon: Vanderbilt Ned, MD;  Location: [facility];  Service: General;  Laterality: Right;   PORT-A-CATH REMOVAL Left 06/30/2022   Procedure: REMOVAL PORT-A-CATH;  Surgeon: Lowery Estefana RAMAN, DO;  Location: [facility];  Service: Plastics;  Laterality: Left;   REMOVAL OF TISSUE EXPANDER AND PLACEMENT OF IMPLANT Right 06/30/2022   Procedure: REMOVAL OF TISSUE EXPANDER AND PLACEMENT OF IMPLANT;  Surgeon: Lowery Estefana RAMAN, DO;  Location: [facility];  Service: Plastics;  Laterality: Right;   WISDOM TOOTH EXTRACTION       reports that she has never smoked. She has never used smokeless tobacco. She reports that she does not currently use alcohol. She reports that she does not use drugs.  Allergies  Allergen Reactions   Sulfa Antibiotics Hives and Other (See Comments)    Pt reports foggy brain, Rashy/ hives, Red spots on Chest    Family History  Problem Relation Age of Onset   Hyperlipidemia Mother    Bone cancer Mother    Hearing loss Mother    Hearing loss Father    Hyperlipidemia Sister    Early death Brother    Birth defects Brother    Early death Maternal Grandmother  Pancreatic cancer Maternal Grandmother    Cancer Maternal Grandfather    Pancreatic cancer Paternal Grandmother    Cancer Paternal Grandfather    Depression Son    Colon cancer Neg Hx    Breast cancer Neg Hx     Prior to Admission medications   Medication Sig Start Date End Date Taking? Authorizing Provider  alendronate  (FOSAMAX ) 70 MG tablet TAKE 1 TABLET (70 MG TOTAL) BY MOUTH ONCE A WEEK. TAKE WITH A FULL GLASS OF WATER  ON AN  EMPTY STOMACH. 11/23/23   Odean Potts, MD  Cholecalciferol (D3 HIGH POTENCY PO)     [provider]  Glucosamine-Chondroitin (OSTEO BI-FLEX REGULAR STRENGTH PO) Take by mouth in the morning and at bedtime. Patient not taking: Reported on 10/10/2023    [provider]  Omega 3 1000 MG CAPS     [provider]  tiZANidine  (ZANAFLEX ) 2 MG tablet Take 1-2 tablets (2-4 mg total) by mouth 2 (two) times daily as needed for muscle spasms. 09/27/23   Arloa Suzen RAMAN, NP  traZODone  (DESYREL ) 50 MG tablet TAKE 0.5-1 TABLETS BY MOUTH AT BEDTIME AS NEEDED FOR SLEEP. 10/12/23   Kennyth Worth [name], MD  Turmeric 500 MG CAPS     [provider]    Physical Exam: Vitals:   12/10/23 1115 12/10/23 1117  BP:  138/69  Pulse:  63  Resp:  17  Temp:  97.7 F (36.5 C)  TempSrc:  Oral  SpO2:  97%  Weight: 80.3 kg   Height: 5' 4 (1.626 m)     Constitutional: NAD, calm, comfortable, on room air, communicating well, family at the bedside Eyes: PERRL, lids and conjunctivae normal ENMT: Mucous membranes are moist. Posterior pharynx clear of any exudate or lesions.Normal dentition.  Neck: normal, supple, no masses, no thyromegaly Respiratory: clear to auscultation bilaterally, no wheezing, no crackles. Normal respiratory effort. No accessory muscle use.  Cardiovascular: Regular rate and rhythm, no murmurs / rubs / gallops. No extremity edema. 2+ pedal pulses. No carotid bruits.  Abdomen: no tenderness, no masses palpated. No hepatosplenomegaly. Bowel sounds positive.  Musculoskeletal: Right hip tenderness positive.  Right leg appears to be shorter than the left.  Restricted range of motion due to severe pain. Skin: no rashes, lesions, ulcers. No induration Neurologic: CN 2-12 grossly intact. Sensation intact, DTR normal.  Psychiatric: Normal judgment and insight. Alert and oriented x 3. Normal mood.    Labs on Admission: I have personally reviewed following labs and imaging  studies  CBC: Recent Labs  Lab 12/10/23 1138  WBC 6.7  NEUTROABS 3.4  HGB 11.6*  HCT 36.0  MCV 89.3  PLT 238   Basic Metabolic Panel: Recent Labs  Lab 12/10/23 1138  NA 140  K 3.8  CL 106  CO2 25  GLUCOSE 118*  BUN 10  CREATININE 0.67  CALCIUM 9.2   GFR: Estimated Creatinine Clearance: 61.3 mL/min (by C-G formula based on SCr of 0.67 mg/dL). Liver Function Tests: Recent Labs  Lab 12/10/23 1138  AST 33  ALT 35  ALKPHOS 56  BILITOT 0.7  PROT 6.3*  ALBUMIN 3.7   No results for input(s): LIPASE, AMYLASE in the last 168 hours. No results for input(s): AMMONIA in the last 168 hours. Coagulation Profile: Recent Labs  Lab 12/10/23 1115  INR 1.0   Cardiac Enzymes: No results for input(s): CKTOTAL, CKMB, CKMBINDEX, TROPONINI in the last 168 hours. BNP (last 3 results) No results for input(s): PROBNP in the last 8760 hours.  HbA1C: No results for input(s): HGBA1C in the last 72 hours. CBG: No results for input(s): GLUCAP in the last 168 hours. Lipid Profile: No results for input(s): CHOL, HDL, LDLCALC, TRIG, CHOLHDL, LDLDIRECT in the last 72 hours. Thyroid  Function Tests: No results for input(s): TSH, T4TOTAL, FREET4, T3FREE, THYROIDAB in the last 72 hours. Anemia Panel: No results for input(s): VITAMINB12, FOLATE, FERRITIN, TIBC, IRON, RETICCTPCT in the last 72 hours. Urine analysis: No results found for: COLORURINE, APPEARANCEUR, LABSPEC, PHURINE, GLUCOSEU, HGBUR, BILIRUBINUR, KETONESUR, PROTEINUR, UROBILINOGEN, NITRITE, LEUKOCYTESUR  Radiological Exams on Admission: DG Pelvis 1-2 Views Result Date: 12/10/2023 CLINICAL DATA:  Fall, right hip fracture EXAM: PELVIS - 1-2 VIEW COMPARISON:  None Available. FINDINGS: There is an acute intratrochanteric fracture of the right hip with superior displacement, override and external rotation of the distal femoral shaftl and avulsion of the  lesser trochanter with 12 mm distraction of the fracture fragment. Femoral head is still seated within the right acetabulum and joint space is preserved. Pelvis and visualized left hip are intact. Soft tissues unremarkable. IMPRESSION: 1. Acute intratrochanteric fracture of the right hip. Electronically Signed   By: Dorethia Molt M.D.   On: 12/10/2023 12:02   DG Chest Portable 1 View Result Date: 12/10/2023 CLINICAL DATA:  Fall, right hip fracture. EXAM: PORTABLE CHEST 1 VIEW COMPARISON:  None Available. FINDINGS: Lungs are well expanded, symmetric, and clear. No pneumothorax or pleural effusion. Cardiac size within normal limits. Pulmonary vascularity is normal. Surgical clips noted within the right axilla. Osseous structures are age-appropriate. No acute bone abnormality. IMPRESSION: No active disease. Electronically Signed   By: Dorethia Molt M.D.   On: 12/10/2023 12:01   DG Femur Min 2 Views Right Result Date: 12/10/2023 CLINICAL DATA:  Fall, right hip pain EXAM: RIGHT FEMUR 2 VIEWS COMPARISON:  None Available. FINDINGS: There is an acute intratrochanteric fracture of the right femoral neck with avulsion of the lesser trochanter by approximately 15 mm. Mild distraction of the major fracture fragments by approximately 5 mm. Major fracture fragments are normally aligned. Femoral head is still seated within the right acetabulum and right hip joint space is preserved. Distal femoral shaft is intact. Visualized right hemipelvis is intact. IMPRESSION: 1. Acute intratrochanteric fracture of the right femoral neck. Electronically Signed   By: Dorethia Molt M.D.   On: 12/10/2023 12:00    EKG: Independently reviewed.  Normal sinus rhythm.  Borderline T wave abnormalities.  No acute ST elevation or depression noted.  Assessment/Plan  Right intertrochanteric femoral neck fracture: - Status post fall.  History of osteoporosis. - Reviewed x-ray. - EDP consulted Ortho-plan is for surgical fixation today.   Will keep her n.p.o.  Continue as needed pain medications.  Monitor H&H postoperatively.  History of right breast cancer: - Status post right mastectomy and chemoradiation therapy.  Followed by oncology outpatient  Osteoporosis: On Fosamax .  Chronic anemia: Appears to be at baseline.  Continue to monitor  Mixed hyperlipidemia: Currently not on any treatment  DVT prophylaxis: SCD, no chemical anticoagulation due to schedule surgery today Code Status: SCD Family Communication: Family present at bedside.  Plan of care discussed with patient in length and she verbalized understanding and agreed with it. Disposition Plan: To be determined Consults called: Ortho Admission status: Inpatient   Velna JONELLE Skeeter MD [facility]  If 7PM-7AM, please contact night-coverage [email or web address]  12/10/2023, 1:06 PM

## 2023-12-11 ENCOUNTER — Encounter (HOSPITAL_COMMUNITY): Payer: Self-pay | Admitting: Orthopedic Surgery

## 2023-12-11 DIAGNOSIS — S72001A Fracture of unspecified part of neck of right femur, initial encounter for closed fracture: Secondary | ICD-10-CM | POA: Diagnosis not present

## 2023-12-11 LAB — BASIC METABOLIC PANEL WITH GFR
Anion gap: 12 (ref 5–15)
BUN: 9 mg/dL (ref 8–23)
CO2: 23 mmol/L (ref 22–32)
Calcium: 8.4 mg/dL — ABNORMAL LOW (ref 8.9–10.3)
Chloride: 101 mmol/L (ref 98–111)
Creatinine, Ser: 0.82 mg/dL (ref 0.44–1.00)
GFR, Estimated: >60 mL/min (ref >60)
Glucose, Bld: 145 mg/dL — ABNORMAL HIGH (ref 70–99)
Potassium: 4.2 mmol/L (ref 3.5–5.1)
Sodium: 136 mmol/L (ref 135–145)

## 2023-12-11 LAB — CBC
HCT: 27.6 % — ABNORMAL LOW (ref 36.0–46.0)
Hemoglobin: 8.9 g/dL — ABNORMAL LOW (ref 12.0–15.0)
MCH: 28.7 pg (ref 26.0–34.0)
MCHC: 32.2 g/dL (ref 30.0–36.0)
MCV: 89 fL (ref 80.0–100.0)
Platelets: 276 K/uL (ref 150–400)
RBC: 3.1 MIL/uL — ABNORMAL LOW (ref 3.87–5.11)
RDW: 14 % (ref 11.5–15.5)
WBC: 12.4 K/uL — ABNORMAL HIGH (ref 4.0–10.5)
nRBC: 0 % (ref 0.0–0.2)

## 2023-12-11 NOTE — [place] FL2 (Signed)
 [place]  MEDICAID FL2 LEVEL OF CARE FORM     IDENTIFICATION  Patient Name: Marie Day Birthdate: June 26, 1945 Sex: female Admission Date (Current Location): 12/10/2023  [facility] and [place] Number:  Producer, television/film/video and Address:  [facility] [place], [address] [place], [place] [address]      Provider Number: [id number]  Attending Physician Name and Address:  Rosario Leatrice FERNS, MD  Relative Name and Phone Number:  Macrina Lehnert; Spouse; [phone]    Current Level of Care: Hospital Recommended Level of Care: Skilled Nursing Facility Prior Approval Number:    Date Approved/Denied:   PASRR Number: [id number]  Discharge Plan: SNF    Current Diagnoses: Patient Active Problem List   Diagnosis Date Noted   Right femoral fracture (HCC) 12/10/2023   Displaced intertrochanteric fracture of right femur, initial encounter for closed fracture (HCC) 12/10/2023   Elevated antinuclear antibody (ANA) level 09/27/2023   Insomnia 09/18/2023   Dyslipidemia 03/24/2023   Body mass index (BMI) 24.0-24.9, adult 02/28/2023   Joint swelling 02/28/2023   Other specified abnormal immunological findings in serum 02/28/2023   Pain in limb 02/28/2023   Tinnitus 12/21/2022   Breast cancer (HCC) 12/21/2022   Joint stiffness 09/01/2022   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   Polyarthralgia 02/08/2022   S/P breast reconstruction 06/29/2021   Malignant neoplasm of upper-outer quadrant of right breast in female, estrogen receptor negative (HCC) 12/21/2020   Anemia 10/27/2020   Prediabetes 10/27/2020   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   Unspecified age-related cataract 12/14/2015    Orientation RESPIRATION BLADDER Height & Weight     Self, Place, Situation, Time  Normal (Room Air) Continent Weight: 177 lb (80.3 kg) Height:  5' 4 (162.6 cm)  BEHAVIORAL SYMPTOMS/MOOD NEUROLOGICAL BOWEL NUTRITION STATUS       Continent Diet (Please see discharge summary)  AMBULATORY STATUS COMMUNICATION OF NEEDS Skin   Limited Assist Verbally Other (Comment) (Closed Surgical Incision Thigh Right)                       Personal Care Assistance Level of Assistance  Bathing, Feeding, Dressing Bathing Assistance: Limited assistance Feeding assistance: Limited assistance Dressing Assistance: Limited assistance     Functional Limitations Info  Sight Sight Info: Impaired (R and L)        SPECIAL CARE FACTORS FREQUENCY  PT (By licensed PT), OT (By licensed OT)     PT Frequency: 5x OT Frequency: 5x            Contractures Contractures Info: Not present    Additional Factors Info  Code Status, Allergies Code Status Info: Full Code Allergies Info: Sulfa Antibiotics           Current Medications (12/11/2023):  This is the current hospital active medication list Current Facility-Administered Medications  Medication Dose Route Frequency Provider Last Rate Last Admin   acetaminophen  (TYLENOL ) tablet 650 mg  650 mg Oral Q6H PRN Pahwani, Rinka R, MD       Or   acetaminophen  (TYLENOL ) suppository 650 mg  650 mg Rectal Q6H PRN Pahwani, Rinka R, MD       acetaminophen  (TYLENOL ) tablet 1,000 mg  1,000 mg Oral Q8H Moore, Michael A, MD   1,000 mg at 12/11/23 1431   aspirin  chewable tablet 81 mg  81 mg Oral BID Moore, Michael A, MD   81 mg at 12/11/23 0829   feeding supplement (ENSURE SURGERY) liquid 237  mL  237 mL Oral BID BM Georgina Ozell LABOR, MD       morphine  (PF) 2 MG/ML injection 2 mg  2 mg Intravenous Q2H PRN Pahwani, Rinka R, MD   2 mg at 12/10/23 1507   morphine  (PF) 2 MG/ML injection 2 mg  2 mg Intravenous Q3H PRN Moore, Michael A, MD       ondansetron  (ZOFRAN ) tablet 4 mg  4 mg Oral Q6H PRN Georgina Ozell LABOR, MD       Or   ondansetron  (ZOFRAN ) injection 4 mg  4 mg Intravenous Q6H PRN Moore, Michael A, MD       oxyCODONE  (Oxy IR/ROXICODONE ) immediate release tablet 5 mg  5 mg Oral Q4H PRN  Pahwani, Rinka R, MD       oxyCODONE  (Oxy IR/ROXICODONE ) immediate release tablet 5-10 mg  5-10 mg Oral Q4H PRN Moore, Michael A, MD   10 mg at 12/11/23 0830   polyethylene glycol (MIRALAX  / GLYCOLAX ) packet 17 g  17 g Oral Daily Moore, Michael A, MD   17 g at 12/11/23 9167   senna (SENOKOT) tablet 8.6 mg  1 tablet Oral BID Moore, Michael A, MD   8.6 mg at 12/11/23 0830     Discharge Medications: Please see discharge summary for a list of discharge medications.  Relevant Imaging Results:  Relevant Lab Results:   Additional Information SS# [id number]  Marie Day Saa, LCSW

## 2023-12-11 NOTE — Progress Notes (Signed)
 PROGRESS NOTE    Marie Day  FMW:[id number] DOB: [birth year] DOA: 12/10/2023 PCP: Kennyth Worth [name], MD  Outpatient Specialists:     Brief Narrative:  Patient is a 78 year old female with past medical history significant for breast cancer s/p mastectomy and chemoradiation, and osteoporosis on Fosamax .  Patient was admitted with acute intertrochanteric fracture of the right femoral neck following a mechanical fall.  Patient underwent right intertrochanteric fracture open reduction on and internal fixation with cephalomedullary rod on 12/10/2023.  12/11/2023: Patient seen alongside patient's husband.  Pain is controlled.  Labs done today revealed sodium of 136, potassium of 4.2, chloride 101, CO2 of 23, BUN of 9, serum creatinine of 0.82 with blood sugar 145.  CBC revealed WBC of 12.4, hemoglobin of 8.9, hematocrit 27.6, MCV of 89, platelet count of 276.   Assessment & Plan:   Principal Problem:   Right femoral fracture (HCC) Active Problems:   Displaced intertrochanteric fracture of right femur, initial encounter for closed fracture (HCC)   Right intertrochanteric femoral neck fracture: - Status post fall.  History of osteoporosis. - Reviewed x-ray. - Orthopedic surgery input is highly appreciated. - Patient underwent surgery for right femoral neck fracture on 12/10/2023.  Please see above. - Patient is stable postop.  Postop care as per orthopedic surgery team.  Patient is on aspirin  81 Mg p.o. twice daily.   History of right breast cancer: - Status post right mastectomy and chemoradiation therapy.  Followed by oncology outpatient   Osteoporosis: On Fosamax .   Chronic anemia: Appears to be at baseline.  Continue to monitor   Mixed hyperlipidemia: Currently not on any treatment   DVT prophylaxis: Aspirin  81 Mg p.o. twice daily. Code Status: Full code Family Communication: Husband at bedside. Disposition Plan: Inpatient.   Consultants:  Orthopedic surgery.  Procedures:   right intertrochanteric fracture open reduction on and internal fixation with cephalomedullary rod   Antimicrobials:  None   Subjective: No new complaints. Pain is controlled.  Objective: Vitals:   12/10/23 2034 12/11/23 0028 12/11/23 0549 12/11/23 0732  BP: (!) 108/59 110/62 (!) 132/54 (!) 95/50  Pulse: 71 70 78 81  Resp: 18 18 18 18   Temp: 97.6 F (36.4 C) 97.6 F (36.4 C) 97.8 F (36.6 C) 98.4 F (36.9 C)  TempSrc: Oral Oral Oral Oral  SpO2: 98% 98% 97% 96%  Weight:      Height:        Intake/Output Summary (Last 24 hours) at 12/11/2023 1024 Last data filed at 12/11/2023 0400 Gross per 24 hour  Intake 1591.72 ml  Output 380 ml  Net 1211.72 ml   Filed Weights   12/10/23 1115  Weight: 80.3 kg    Examination:  General exam: Appears calm and comfortable  Respiratory system: Clear to auscultation.  Cardiovascular system: S1 & S2 heard Gastrointestinal system: Abdomen is soft and nontender. Central nervous system: Awake and alert.   Extremities: No leg edema.  Data Reviewed: I have personally reviewed following labs and imaging studies  CBC: Recent Labs  Lab 12/10/23 1138 12/11/23 0634  WBC 6.7 12.4*  NEUTROABS 3.4  --   HGB 11.6* 8.9*  HCT 36.0 27.6*  MCV 89.3 89.0  PLT 238 276   Basic Metabolic Panel: Recent Labs  Lab 12/10/23 1138 12/11/23 0634  NA 140 136  K 3.8 4.2  CL 106 101  CO2 25 23  GLUCOSE 118* 145*  BUN 10 9  CREATININE 0.67 0.82  CALCIUM 9.2 8.4*   GFR: Estimated  Creatinine Clearance: 59.8 mL/min (by C-G formula based on SCr of 0.82 mg/dL). Liver Function Tests: Recent Labs  Lab 12/10/23 1138  AST 33  ALT 35  ALKPHOS 56  BILITOT 0.7  PROT 6.3*  ALBUMIN 3.7   No results for input(s): LIPASE, AMYLASE in the last 168 hours. No results for input(s): AMMONIA in the last 168 hours. Coagulation Profile: Recent Labs  Lab 12/10/23 1115  INR 1.0   Cardiac Enzymes: No results for input(s): CKTOTAL, CKMB,  CKMBINDEX, TROPONINI in the last 168 hours. BNP (last 3 results) No results for input(s): PROBNP in the last 8760 hours. HbA1C: No results for input(s): HGBA1C in the last 72 hours. CBG: No results for input(s): GLUCAP in the last 168 hours. Lipid Profile: No results for input(s): CHOL, HDL, LDLCALC, TRIG, CHOLHDL, LDLDIRECT in the last 72 hours. Thyroid  Function Tests: No results for input(s): TSH, T4TOTAL, FREET4, T3FREE, THYROIDAB in the last 72 hours. Anemia Panel: No results for input(s): VITAMINB12, FOLATE, FERRITIN, TIBC, IRON, RETICCTPCT in the last 72 hours. Urine analysis: No results found for: COLORURINE, APPEARANCEUR, LABSPEC, PHURINE, GLUCOSEU, HGBUR, BILIRUBINUR, KETONESUR, PROTEINUR, UROBILINOGEN, NITRITE, LEUKOCYTESUR Sepsis Labs: @LABRCNTIP (procalcitonin:4,lacticidven:4)  ) Recent Results (from the past 240 hours)  Surgical pcr screen     Status: None   Collection Time: 12/10/23  3:18 PM   Specimen: Nasal Mucosa; Nasal Swab  Result Value Ref Range Status   MRSA, PCR NEGATIVE NEGATIVE Final   Staphylococcus aureus NEGATIVE NEGATIVE Final    Comment: (NOTE) The Xpert SA Assay (FDA approved for NASAL specimens in patients 21 years of age and older), is one component of a comprehensive surveillance program. It is not intended to diagnose infection nor to guide or monitor treatment. Performed at [facility], [address], [place], [place] [address]          Radiology Studies: DG HIP UNILAT WITH PELVIS 2-3 VIEWS RIGHT Result Date: 12/10/2023 CLINICAL DATA:  Postop. EXAM: DG HIP (WITH OR WITHOUT PELVIS) 2-3V RIGHT COMPARISON:  Preoperative imaging FINDINGS: Femoral intramedullary nail with trans trochanteric and distal locking screw fixation traversing proximal femur fracture. Persistent displacement of lesser trochanteric fragment. Recent postsurgical change includes air and edema in the  soft tissues with overlying skin staples in place. IMPRESSION: ORIF of proximal femur fracture. Electronically Signed   By: Andrea Gasman M.D.   On: 12/10/2023 18:43   DG HIP UNILAT WITH PELVIS 2-3 VIEWS RIGHT Result Date: 12/10/2023 CLINICAL DATA:  Elective surgery EXAM: DG HIP (WITH OR WITHOUT PELVIS) 2-3V RIGHT COMPARISON:  Preoperative imaging FINDINGS: Seven fluoroscopic spot views of the right hip submitted from the operating room. Femoral intramedullary nail with trans trochanteric and distal locking screw fixation traversing proximal femur fracture. Fluoroscopy time 1 minutes 57 seconds. Dose 34.89 mGy. IMPRESSION: Intraoperative fluoroscopy during proximal femur fracture fixation. Electronically Signed   By: Andrea Gasman M.D.   On: 12/10/2023 18:42   DG C-Arm 1-60 Min-No Report Result Date: 12/10/2023 Fluoroscopy was utilized by the requesting physician.  No radiographic interpretation.   DG C-Arm 1-60 Min-No Report Result Date: 12/10/2023 Fluoroscopy was utilized by the requesting physician.  No radiographic interpretation.   DG Pelvis 1-2 Views Result Date: 12/10/2023 CLINICAL DATA:  Fall, right hip fracture EXAM: PELVIS - 1-2 VIEW COMPARISON:  None Available. FINDINGS: There is an acute intratrochanteric fracture of the right hip with superior displacement, override and external rotation of the distal femoral shaftl and avulsion of the lesser trochanter with 12 mm distraction  of the fracture fragment. Femoral head is still seated within the right acetabulum and joint space is preserved. Pelvis and visualized left hip are intact. Soft tissues unremarkable. IMPRESSION: 1. Acute intratrochanteric fracture of the right hip. Electronically Signed   By: Dorethia Molt M.D.   On: 12/10/2023 12:02   DG Chest Portable 1 View Result Date: 12/10/2023 CLINICAL DATA:  Fall, right hip fracture. EXAM: PORTABLE CHEST 1 VIEW COMPARISON:  None Available. FINDINGS: Lungs are well expanded, symmetric,  and clear. No pneumothorax or pleural effusion. Cardiac size within normal limits. Pulmonary vascularity is normal. Surgical clips noted within the right axilla. Osseous structures are age-appropriate. No acute bone abnormality. IMPRESSION: No active disease. Electronically Signed   By: Dorethia Molt M.D.   On: 12/10/2023 12:01   DG Femur Min 2 Views Right Result Date: 12/10/2023 CLINICAL DATA:  Fall, right hip pain EXAM: RIGHT FEMUR 2 VIEWS COMPARISON:  None Available. FINDINGS: There is an acute intratrochanteric fracture of the right femoral neck with avulsion of the lesser trochanter by approximately 15 mm. Mild distraction of the major fracture fragments by approximately 5 mm. Major fracture fragments are normally aligned. Femoral head is still seated within the right acetabulum and right hip joint space is preserved. Distal femoral shaft is intact. Visualized right hemipelvis is intact. IMPRESSION: 1. Acute intratrochanteric fracture of the right femoral neck. Electronically Signed   By: Dorethia Molt M.D.   On: 12/10/2023 12:00        Scheduled Meds:  acetaminophen   1,000 mg Oral Q8H   aspirin   81 mg Oral BID   feeding supplement  237 mL Oral BID BM   polyethylene glycol  17 g Oral Daily   senna  1 tablet Oral BID   Continuous Infusions:   ceFAZolin  (ANCEF ) IV 2 g (12/11/23 0305)     LOS: 1 day    Time spent: 35 minutes.    Leatrice Chapel, MD  [facility] Pager #: [phone] 7PM-7AM contact night coverage as above

## 2023-12-11 NOTE — Progress Notes (Signed)
 Orthopedic Surgery Progress Note   Assessment: Patient is a 78 y.o. female with right intertrochanteric femur fracture status post CMN   Plan: -Operative plans: complete -Diet: regular -DVT ppx: aspirin  81mg  BID -Antibiotics: ancef  x2 post-op doses -Weight bearing status: as tolerated -PT evaluate and treat -Pain control -Dispo: per primary  ___________________________________________________________________________  Subjective: No acute events overnight. Pain tolerable in bed. Has not tried mobilizing yet.    Physical Exam:  General: no acute distress, appears stated age Neurologic: alert, answering questions appropriately, following commands Respiratory: unlabored breathing on room air, symmetric chest rise Psychiatric: appropriate affect, normal cadence to speech  MSK:   -Right lower extremity  Dressings over hip c/d/i EHL/TA/GSC intact Plantarflexes and dorsiflexes toes Sensation intact to light touch in sural, saphenous, tibial, deep peroneal, and superficial peroneal nerve distributions Foot warm and well perfused   Patient name: Marie Day Patient MRN: [id number] Date: 12/11/23

## 2023-12-11 NOTE — Anesthesia Postprocedure Evaluation (Signed)
 Anesthesia Post Note  Patient: Marie Day  Procedure(s) Performed: FIXATION, FRACTURE, INTERTROCHANTERIC, WITH INTRAMEDULLARY ROD (Right)     Patient location during evaluation: PACU Anesthesia Type: General Level of consciousness: sedated and patient cooperative Pain management: pain level controlled Vital Signs Assessment: post-procedure vital signs reviewed and stable Respiratory status: spontaneous breathing Cardiovascular status: stable Anesthetic complications: no   No notable events documented.  Last Vitals:  Vitals:   12/11/23 1551 12/11/23 2020  BP: 98/83 (!) 118/43  Pulse: 94 (!) 110  Resp: 18 18  Temp: 37.2 C 37.1 C  SpO2: 92% 94%    Last Pain:  Vitals:   12/11/23 1551  TempSrc: Oral  PainSc:                  Norleen Pope

## 2023-12-11 NOTE — Evaluation (Signed)
 Physical Therapy Evaluation Patient Details Name: Marie Day MRN: [id number] DOB: 01-06-1946 Today's Date: 12/11/2023  History of Present Illness  Pt is a 78 y.o. F [facility] on 12[facility]10[facility]23 following a fall sustaining R hip fx. Pt is s/p ORIF on 12/10/23. PMH is significant for R breast CA, and osteoporosis.  Clinical Impression  Prior to admittance, pt was mobilizing independently and was independent with ADLs. Pt presents to evaluation with deficits in mobility, strength, power, balance, activity tolerance, and pain, all limiting pt's ability to mobilize near baseline. Pt was able to perform a sit to stand at the edge of the bed with AD and minimal physical assistance, but was unable to progress due to increasing dizziness; check general commends section for BP. Pt was then able to tolerate bed level exercises and was left w/ bed in chair in position. PT will continue to treat pt while she is admitted. Based on performance today, feel continued inpatient follow up therapy, <3 hours/day, however pt has the potential to make quick progress and safely discharge home with HHPT.          If plan is discharge home, recommend the following: A little help with walking and/or transfers;A little help with bathing/dressing/bathroom;Assistance with cooking/housework;Assist for transportation;Help with stairs or ramp for entrance   Can travel by private vehicle   No    Equipment Recommendations Rolling walker (2 wheels);BSC/3in1;Wheelchair (measurements PT);Wheelchair cushion (measurements PT)  Recommendations for Other Services  OT consult    Functional Status Assessment Patient has had a recent decline in their functional status and demonstrates the ability to make significant improvements in function in a reasonable and predictable amount of time.     Precautions / Restrictions Precautions Precautions: Fall Recall of Precautions/Restrictions: Intact Restrictions Weight Bearing  Restrictions Per Provider Order: Yes RLE Weight Bearing Per Provider Order: Weight bearing as tolerated      Mobility  Bed Mobility Overal bed mobility: Needs Assistance Bed Mobility: Supine to Sit, Rolling, Sit to Sidelying Rolling: Contact guard assist, Used rails   Supine to sit: Min assist, HOB elevated, Used rails   Sit to sidelying: Min assist, HOB elevated, Used rails General bed mobility comments: Pt requires physical assistance for RLE advancement for all bed mobility, w/ pt able to initiate some movement w/ sup>sit. Step by step cues given for sequencing. Increased time to complete. Pt able to perform lateral scoot towards [facility] but unable to obtain complete hip clearance.    Transfers Overall transfer level: Needs assistance Equipment used: Rolling walker (2 wheels) Transfers: Sit to/from Stand Sit to Stand: Min assist           General transfer comment: STS from EOB w/ physical assistance for initial power-up. Pt demonstrates good anterior trunk lean following cueing obtaining hip clearance, and uses LUE to push up from surface and RUE to stabilize RW. VC given for UE and LE placement; increased time to complete. Pt able to perform lateral scoot towards [facility] w/ supervision.    Ambulation/Gait                  Stairs            Wheelchair Mobility     Tilt Bed    Modified Rankin (Stroke Patients Only)       Balance Overall balance assessment: Needs assistance Sitting-balance support: No upper extremity supported, Feet supported Sitting balance-Leahy Scale: Fair Sitting balance - Comments: seated EOB   Standing balance support: Bilateral upper  extremity supported, During functional activity, Reliant on assistive device for balance Standing balance-Leahy Scale: Poor Standing balance comment: reliant on external support                             Pertinent Vitals/Pain Pain Assessment Pain Assessment: Faces Faces Pain Scale:  Hurts little more Pain Location: R hip Pain Descriptors / Indicators: Discomfort, Grimacing, Sore Pain Intervention(s): Limited activity within patient's tolerance, Monitored during session    Home Living Family/patient expects to be discharged to:: Private residence Living Arrangements: Spouse/significant other Available Help at Discharge: Family;Available PRN/intermittently (able to help most of the day except when he goes to the gym) Type of Home: House Home Access: Level entry     Alternate Level Stairs-Number of Steps: 4 Home Layout: Multi-level Home Equipment: Cane - single point      Prior Function Prior Level of Function : Independent/Modified Independent;Driving;History of Falls (last six months) (1 fall)             Mobility Comments: independent w/out an AD ADLs Comments: independent     Extremity/Trunk Assessment   Upper Extremity Assessment Upper Extremity Assessment: Defer to OT evaluation    Lower Extremity Assessment Lower Extremity Assessment: RLE deficits/detail RLE Deficits / Details: reduced as per functional assessment RLE Sensation: WNL RLE Coordination: WNL    Cervical / Trunk Assessment Cervical / Trunk Assessment: Kyphotic  Communication   Communication Communication: No apparent difficulties    Cognition Arousal: Alert Behavior During Therapy: WFL for tasks assessed/performed   PT - Cognitive impairments: No apparent impairments                         Following commands: Intact       Cueing Cueing Techniques: Verbal cues, Visual cues     General Comments General comments (skin integrity, edema, etc.): pt reports dizziness upon sitting EOB and standing. BP in sitting 95/77; supine 95/52; following bed level exercises w/ bed in chair position and HOB at 49 degrees BP: 104/48. At end of session, pt reports dizziness has subsided    Exercises General Exercises - Lower Extremity Ankle Circles/Pumps: AROM, Both, 10 reps,  Supine Quad Sets: AAROM, Right, 10 reps, Supine Heel Slides: AAROM, Right, 10 reps, Supine Hip ABduction/ADduction: AAROM, Right, 10 reps, Supine   Assessment/Plan    PT Assessment Patient needs continued PT services  PT Problem List Decreased strength;Decreased range of motion;Decreased balance;Decreased activity tolerance;Decreased mobility;Decreased knowledge of use of DME;Cardiopulmonary status limiting activity;Pain       PT Treatment Interventions Gait training;DME instruction;Stair training;Functional mobility training;Therapeutic activities;Therapeutic exercise;Balance training;Neuromuscular re-education;Patient/family education;Wheelchair mobility training;Manual techniques;Modalities    PT Goals (Current goals can be found in the Care Plan section)  Acute Rehab PT Goals Patient Stated Goal: to go home PT Goal Formulation: With patient Time For Goal Achievement: 12/25/23 Potential to Achieve Goals: Fair    Frequency Min 3X/week     Co-evaluation               AM-PAC PT 6 Clicks Mobility  Outcome Measure Help needed turning from your back to your side while in a flat bed without using bedrails?: A Little Help needed moving from lying on your back to sitting on the side of a flat bed without using bedrails?: A Little Help needed moving to and from a bed to a chair (including a wheelchair)?: A Little Help needed standing up from  a chair using your arms (e.g., wheelchair or bedside chair)?: A Little Help needed to walk in hospital room?: A Lot Help needed climbing 3-5 steps with a railing? : Total 6 Click Score: 15    End of Session Equipment Utilized During Treatment: Gait belt Activity Tolerance: Other (comment) (orthostatics) Patient left: in bed;with call bell/phone within reach;with bed alarm set Nurse Communication: Mobility status;Other (comment) (orthostatics) PT Visit Diagnosis: Muscle weakness (generalized) (M62.81);History of falling  (Z91.81);Pain Pain - Right/Left: Right Pain - part of body: Hip    Time: 0940-1020 PT Time Calculation (min) (ACUTE ONLY): 40 min   Charges:   PT Evaluation $PT Eval Moderate Complexity: 1 Mod PT Treatments $Gait Training: 8-22 mins $Therapeutic Activity: 8-22 mins PT General Charges $$ ACUTE PT VISIT: 1 Visit         Leontine Hilt, SPT Acute Rehab [phone]   Leontine Hilt 12/11/2023, 11:42 AM

## 2023-12-11 NOTE — TOC CM/SW Note (Signed)
 Transition of Care [name]) - Inpatient Brief Assessment   Patient Details  Name: Marie Day MRN: [id number] Date of Birth: 1946/04/06  Transition of Care [facility]) CM/SW Contact:    Lauraine FORBES Saa, LCSW Phone Number: 12/11/2023, 10:05 AM   Clinical Narrative:  10:05 AM Per chart review, patient resides at home. Patient has a PCP and insurance. Patient does not have SNF/HH/DME history. [facility]'s preferred [facility] [phone]. No TOC needs were identified at this time. TOC will continue to follow and be available to assist.  Transition of Care Asessment: Insurance and Status: Insurance coverage has been reviewed Patient has primary care physician: Yes Home environment has been reviewed: Private Residence Prior level of function:: N/A Prior/Current Home Services: No current home services Social Drivers of Health Review: SDOH reviewed no interventions necessary Readmission risk has been reviewed: Yes Transition of care needs: no transition of care needs at this time

## 2023-12-11 NOTE — TOC Initial Note (Signed)
 Transition of Care [facility]) - Initial/Assessment Note    Patient Details  Name: Marie Day MRN: [id number] Date of Birth: 09-Apr-1946  Transition of Care [facility]) CM/SW Contact:    Lauraine FORBES Saa, LCSW Phone Number: 12/11/2023, 4:51 PM  Clinical Narrative:                  4:51 PM CSW introduced self and role to patient. CSW informed patient of physical therapy's recommendation of discharging to SNF. Patient was agreeable with recommendation and consented CSW to send referrals to SNFs within [facility].  Expected Discharge Plan: Skilled Nursing Facility Barriers to Discharge: Continued Medical Work up, English as a second language teacher, SNF Pending bed offer   Patient Goals and CMS Choice Patient states their goals for this hospitalization and ongoing recovery are:: SNF          Expected Discharge Plan and Services In-house Referral: Clinical Social Work   Post Acute Care Choice: Skilled Nursing Facility Living arrangements for the past 2 months: Single Family Home                                      Prior Living Arrangements/Services Living arrangements for the past 2 months: Single Family Home Lives with:: Spouse Patient language and need for interpreter reviewed:: Yes              Criminal Activity/Legal Involvement Pertinent to Current Situation/Hospitalization: No - Comment as needed  Activities of Daily Living      Permission Sought/Granted Permission sought to share information with : Family Supports, Oceanographer granted to share information with : No (Contact information on chart)  Share Information with NAME: Terrell Shimko  Permission granted to share info w AGENCY: SNF  Permission granted to share info w Relationship: Spouse  Permission granted to share info w Contact Information: [phone]  Emotional Assessment Appearance:: Appears stated age Attitude/Demeanor/Rapport: Engaged Affect (typically observed):  Accepting, Appropriate, Adaptable, Calm, Stable, Pleasant Orientation: : Oriented to Self, Oriented to Place, Oriented to  Time, Oriented to Situation Alcohol / Substance Use: Not Applicable Psych Involvement: No (comment)  Admission diagnosis:  Right femoral fracture (HCC) [S72.91XA] Patient Active Problem List   Diagnosis Date Noted   Right femoral fracture (HCC) 12/10/2023   Displaced intertrochanteric fracture of right femur, initial encounter for closed fracture (HCC) 12/10/2023   Elevated antinuclear antibody (ANA) level 09/27/2023   Insomnia 09/18/2023   Dyslipidemia 03/24/2023   Body mass index (BMI) 24.0-24.9, adult 02/28/2023   Joint swelling 02/28/2023   Other specified abnormal immunological findings in serum 02/28/2023   Pain in limb 02/28/2023   Tinnitus 12/21/2022   Breast cancer (HCC) 12/21/2022   Joint stiffness 09/01/2022   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 [place] [address] Phone: [phone] Fax: [phone]     Social Drivers of Health (SDOH) Social History: SDOH Screenings   Food Insecurity: No Food Insecurity (12/10/2023)  Housing: Low Risk  (12/10/2023)  Transportation Needs: No Transportation Needs (12/10/2023)  Utilities: Not At Risk (12/10/2023)  Alcohol Screen: Low Risk  (10/10/2023)  Depression (PHQ2-9): Low Risk  (10/10/2023)  Financial Resource Strain: Low Risk  (10/10/2023)  Physical Activity: Inactive (10/10/2023)  Social Connections: Unknown (12/10/2023)  Stress: No  Stress Concern Present (10/10/2023)  Tobacco Use: Low Risk  (12/10/2023)  Health Literacy: Adequate Health Literacy (10/10/2023)   SDOH Interventions:     Readmission Risk Interventions     No data to display

## 2023-12-12 DIAGNOSIS — S72331A Displaced oblique fracture of shaft of right femur, initial encounter for closed fracture: Secondary | ICD-10-CM | POA: Diagnosis not present

## 2023-12-12 LAB — BASIC METABOLIC PANEL WITH GFR
Anion gap: 9 (ref 5–15)
BUN: 10 mg/dL (ref 8–23)
CO2: 24 mmol/L (ref 22–32)
Calcium: 8.4 mg/dL — ABNORMAL LOW (ref 8.9–10.3)
Chloride: 102 mmol/L (ref 98–111)
Creatinine, Ser: 0.76 mg/dL (ref 0.44–1.00)
GFR, Estimated: >60 mL/min (ref >60)
Glucose, Bld: 118 mg/dL — ABNORMAL HIGH (ref 70–99)
Potassium: 3.9 mmol/L (ref 3.5–5.1)
Sodium: 135 mmol/L (ref 135–145)

## 2023-12-12 LAB — CBC
HCT: 25.2 % — ABNORMAL LOW (ref 36.0–46.0)
Hemoglobin: 8.3 g/dL — ABNORMAL LOW (ref 12.0–15.0)
MCH: 28.8 pg (ref 26.0–34.0)
MCHC: 32.9 g/dL (ref 30.0–36.0)
MCV: 87.5 fL (ref 80.0–100.0)
Platelets: 221 K/uL (ref 150–400)
RBC: 2.88 MIL/uL — ABNORMAL LOW (ref 3.87–5.11)
RDW: 14.1 % (ref 11.5–15.5)
WBC: 9.8 K/uL (ref 4.0–10.5)
nRBC: 0 % (ref 0.0–0.2)

## 2023-12-12 MED ORDER — BACLOFEN 10 MG PO TABS
10.0000 mg | ORAL_TABLET | Freq: Two times a day (BID) | ORAL | Status: DC | PRN
  Administered 2023-12-12 – 2023-12-21 (×15): 10 mg via ORAL
  Filled 2023-12-12 (×16): qty 1

## 2023-12-12 MED ORDER — DIPHENHYDRAMINE-ZINC ACETATE 2-0.1 % EX CREA
TOPICAL_CREAM | Freq: Three times a day (TID) | CUTANEOUS | Status: DC | PRN
  Filled 2023-12-12: qty 28

## 2023-12-12 NOTE — Progress Notes (Signed)
 PROGRESS NOTE  Marie Day  FMW:[id number] DOB: 05-28-1945 DOA: 12/10/2023 PCP: Kennyth Worth [name], MD  Consultants  Brief Narrative: 78 y.o. female with a PMH significant for breast cancer s/p mastectomy and chemoradiation, and osteoporosis on Fosamax .  Patient was admitted with acute intertrochanteric fracture of the right femoral neck following a mechanical fall.  Patient underwent right intertrochanteric fracture open reduction on and internal fixation with cephalomedullary rod on 12/10/2023.  Pain well-controlled since admission.  Moving towards discharge to SNF     Assessment & Plan:   Right intertrochanteric femoral neck fracture: - Status post fall.  History of osteoporosis. - Reviewed x-ray. - Orthopedic surgery input is highly appreciated. - Patient underwent surgery for right femoral neck fracture on 12/10/2023.   - Patient is stable postop.  Postop care as per orthopedic surgery team.  Patient is on aspirin  81 Mg p.o. twice daily.  She has had some muscle cramping in the back open today.   History of right breast cancer: - Status post right mastectomy and chemoradiation therapy.  Followed by oncology outpatient   Osteoporosis: On Fosamax .   Chronic anemia:  - Baseline appears to be around 10.  She did have a drop after her surgery.  We will trend her hemoglobin.  No overt signs of bleeding.    Mixed hyperlipidemia: Currently not on any treatment       DVT prophylaxis:  SCDs Start: 12/10/23 1306  Code Status:   Code Status: Full Code Family Communication: Daughter on phone, discussed care during my exam Level of care: Med-Surg Status is: Inpatient Dispo: Moving towards discharge to SNF when bed available.   Consults called: Orthopedics  Subjective: Patient awake and alert.  Says that the left cramps the patient is unable to do bedside overall feels the pain is well-controlled.  Eating and drinking well, looking forward to PT  Objective: Vitals:   12/12/23 0532  12/12/23 0757 12/12/23 1121 12/12/23 1452  BP: (!) 119/48 (!) 102/47 112/67 (!) 129/51  Pulse: (!) 115 (!) 115 (!) 107 (!) 106  Resp: 18 17 17 17   Temp: 98.7 F (37.1 C) 99.4 F (37.4 C) 98.5 F (36.9 C) 99.5 F (37.5 C)  TempSrc:  Oral Oral Oral  SpO2: 93% 92% 95% 93%  Weight:      Height:        Intake/Output Summary (Last 24 hours) at 12/12/2023 1645 Last data filed at 12/11/2023 1900 Gross per 24 hour  Intake --  Output 500 ml  Net -500 ml   Filed Weights   12/10/23 1115  Weight: 80.3 kg   Body mass index is 30.38 kg/m.  Gen: 78 y.o. female in no apparent distress.  Nontoxic Pulm: Non-labored breathing.  Clear to auscultation bilaterally.  CV: Regular rate and rhythm. No murmur, rub, or gallop. No JVD GI: Abdomen soft, non-tender, non-distended Ext: Warm, no deformities, bandage for hip c/di/ Skin: No rashes, lesions  Neuro: Alert and oriented. No focal neurological deficits. Psych: Calm  Judgement and insight appear normal. Mood & affect appropriate.    I have personally reviewed the following labs and images: CBC: Recent Labs  Lab 12/10/23 1138 12/11/23 0634 12/12/23 0912  WBC 6.7 12.4* 9.8  NEUTROABS 3.4  --   --   HGB 11.6* 8.9* 8.3*  HCT 36.0 27.6* 25.2*  MCV 89.3 89.0 87.5  PLT 238 276 221   BMP &GFR Recent Labs  Lab 12/10/23 1138 12/11/23 0634 12/12/23 0912  NA 140 136 135  K 3.8 4.2 3.9  CL 106 101 102  CO2 25 23 24   GLUCOSE 118* 145* 118*  BUN 10 9 10   CREATININE 0.67 0.82 0.76  CALCIUM 9.2 8.4* 8.4*   Estimated Creatinine Clearance: 61.3 mL/min (by C-G formula based on SCr of 0.76 mg/dL). Liver & Pancreas: Recent Labs  Lab 12/10/23 1138  AST 33  ALT 35  ALKPHOS 56  BILITOT 0.7  PROT 6.3*  ALBUMIN 3.7   No results for input(s): LIPASE, AMYLASE in the last 168 hours. No results for input(s): AMMONIA in the last 168 hours. Diabetic: No results for input(s): HGBA1C in the last 72 hours. No results for input(s):  GLUCAP in the last 168 hours. Cardiac Enzymes: No results for input(s): CKTOTAL, CKMB, CKMBINDEX, TROPONINI in the last 168 hours. No results for input(s): PROBNP in the last 8760 hours. Coagulation Profile: Recent Labs  Lab 12/10/23 1115  INR 1.0   Thyroid  Function Tests: No results for input(s): TSH, T4TOTAL, FREET4, T3FREE, THYROIDAB in the last 72 hours. Lipid Profile: No results for input(s): CHOL, HDL, LDLCALC, TRIG, CHOLHDL, LDLDIRECT in the last 72 hours. Anemia Panel: No results for input(s): VITAMINB12, FOLATE, FERRITIN, TIBC, IRON, RETICCTPCT in the last 72 hours. Urine analysis: No results found for: COLORURINE, APPEARANCEUR, LABSPEC, PHURINE, GLUCOSEU, HGBUR, BILIRUBINUR, KETONESUR, PROTEINUR, UROBILINOGEN, NITRITE, LEUKOCYTESUR Sepsis Labs: Invalid input(s): PROCALCITONIN, LACTICIDVEN  Microbiology: Recent Results (from the past 240 hours)  Surgical pcr screen     Status: None   Collection Time: 12/10/23  3:18 PM   Specimen: Nasal Mucosa; Nasal Swab  Result Value Ref Range Status   MRSA, PCR NEGATIVE NEGATIVE Final   Staphylococcus aureus NEGATIVE NEGATIVE Final    Comment: (NOTE) The Xpert SA Assay (FDA approved for NASAL specimens in patients 39 years of age and older), is one component of a comprehensive surveillance program. It is not intended to diagnose infection nor to guide or monitor treatment. Performed at [facility], [address] [place], [place] [address]     Radiology Studies: No results found.  Scheduled Meds:  acetaminophen   1,000 mg Oral Q8H   aspirin   81 mg Oral BID   feeding supplement  237 mL Oral BID BM   polyethylene glycol  17 g Oral Daily   senna  1 tablet Oral BID   Continuous Infusions:   LOS: 2 days   35 minutes with more than 50% spent in reviewing records, counseling patient/family and coordinating care.  Reyes VEAR Gaw, MD [facility] [email or web address] 12/12/2023, 4:45 PM

## 2023-12-12 NOTE — Plan of Care (Signed)
  Problem: Pain Managment: Goal: General experience of comfort will improve and/or be controlled Outcome: Progressing   Problem: Safety: Goal: Ability to remain free from injury will improve Outcome: Progressing   Problem: Skin Integrity: Goal: Risk for impaired skin integrity will decrease Outcome: Progressing

## 2023-12-12 NOTE — Evaluation (Addendum)
 Occupational Therapy Evaluation Patient Details Name: TENASIA AULL MRN: [id number] DOB: 1946/05/18 Today's Date: 12/12/2023   History of Present Illness   Pt is a 78 y.o. F presenting to [facility] on 12/10/23 following a fall sustaining R hip fx. Pt is s/p ORIF on 12/10/23. PMH is significant for R breast CA, and osteoporosis.     Clinical Impressions Patient is s/p R hip ORIF surgery resulting in functional limitations due to the deficits listed below (see OT Problem List). Prior to admit, pt was living at home with husband and Independent with all BADL tasks and functional mobility.  Patient will benefit from acute skilled OT to increase their safety and independence with ADL and functional mobility for ADL to allow facilitate discharge. Eval limited d/t to pt becoming orthostatic with positional changes. Was unable to transfer to [facility] during session. Patient will benefit from continued inpatient follow up therapy, <3 hours/day.   BP readings during session 129/58 (76) P:105 supine 76/43 (55) seated immediately 95/58 (71) P: 105 seated after 2 minutes 126/75 (89) P:97 supine        If plan is discharge home, recommend the following:   A lot of help with walking and/or transfers;A lot of help with bathing/dressing/bathroom;Assistance with cooking/housework;Assist for transportation;Help with stairs or ramp for entrance     Functional Status Assessment   Patient has had a recent decline in their functional status and demonstrates the ability to make significant improvements in function in a reasonable and predictable amount of time.     Equipment Recommendations   Other (comment);BSC/3in1;Tub/shower seat (TBD)      Precautions/Restrictions   Precautions Precautions: Fall;Other (comment) Recall of Precautions/Restrictions: Intact Precaution/Restrictions Comments: orthostatic - Watch BP! Restrictions Weight Bearing Restrictions Per Provider Order: Yes RLE Weight Bearing  Per Provider Order: Weight bearing as tolerated     Mobility Bed Mobility Overal bed mobility: Needs Assistance Bed Mobility: Rolling, Supine to Sit, Sit to Supine Rolling: Min assist, Used rails   Supine to sit: Mod assist, Used rails, HOB elevated Sit to supine: Mod assist, +2 for physical assistance, +2 for safety/equipment   General bed mobility comments: Physical assist required to manage RLE and bring off EOB. Pt was provided VC for technique and sequencing. Due to orthostatic BP, Nursing and OT assisted pt back into bed with assistance for BLE and trunk management.    Transfers Overall transfer level: Needs assistance Equipment used: Rolling walker (2 wheels) Transfers: Sit to/from Stand, Bed to chair/wheelchair/BSC Sit to Stand: Mod assist, From elevated surface    General transfer comment: Pt provided with VC for hand placement with RW management prior to sit to stand transition. Required increased time to power up needing a little more assistance this session compared to previous PT eval yesterday. Pt unable to complete lateral step transfer towards BSC d/t symptomatic orthostatic hypotension. OT assisted pt with sitting/laying down in bed and nursing was called to assist.      Balance Overall balance assessment: Needs assistance Sitting-balance support: No upper extremity supported, Feet supported, Single extremity supported, Bilateral upper extremity supported Sitting balance-Leahy Scale: Fair Sitting balance - Comments: seated EOB   Standing balance support: Bilateral upper extremity supported, Reliant on assistive device for balance Standing balance-Leahy Scale: Poor Standing balance comment: reliant on external support      ADL either performed or assessed with clinical judgement   ADL Overall ADL's : Needs assistance/impaired     Grooming: Wash/dry face;Set up;Sitting   Upper Body Bathing: Set  up;Sitting   Lower Body Bathing: Maximal assistance;Sit to/from  stand   Upper Body Dressing : Minimal assistance;Bed level   Lower Body Dressing: Total assistance;Bed level     Toilet Transfer Details (indicate cue type and reason): unable to complete d/t medical/safety reasons   Toileting - Clothing Manipulation Details (indicate cue type and reason): Unable to complete d/t medical/safety reasons        Vision Baseline Vision/History: 1 Wears glasses Ability to See in Adequate Light: 0 Adequate Patient Visual Report: No change from baseline Vision Assessment?: No apparent visual deficits;Wears glasses for reading     Perception Perception: Within Functional Limits       Praxis Praxis: [facility]       Pertinent Vitals/Pain Pain Assessment Pain Assessment: Faces Faces Pain Scale: Hurts little more Pain Location: R hip Pain Descriptors / Indicators: Discomfort, Grimacing, Sore Pain Intervention(s): Limited activity within patient's tolerance, Monitored during session, Premedicated before session     Extremity/Trunk Assessment Upper Extremity Assessment Upper Extremity Assessment: Right hand dominant;Generalized weakness   Lower Extremity Assessment Lower Extremity Assessment: Defer to PT evaluation   Cervical / Trunk Assessment Cervical / Trunk Assessment: Kyphotic   Communication Communication Communication: No apparent difficulties   Cognition Arousal: Alert Behavior During Therapy: WFL for tasks assessed/performed Cognition: No apparent impairments    Following commands: Intact       Cueing  General Comments   Cueing Techniques: Verbal cues  BP taken while supine in bed then seated on EOB. Reported mild dizziness upon sitting on EOB. After waiting 2 minutes, BP was taken again. Unable to take BP while standing. BP readings at top of this note.           Home Living Family/patient expects to be discharged to:: Private residence Living Arrangements: Spouse/significant other Available Help at Discharge:  Family;Available PRN/intermittently Type of Home: House Home Access: Level entry     Home Layout: Multi-level Alternate Level Stairs-Number of Steps: 4 steps up to kitchen or 4 steps down towards bedroom Alternate Level Stairs-Rails: Right;Left Bathroom Shower/Tub: Walk-in shower;Tub/shower unit   Bathroom Toilet: Handicapped height Bathroom Accessibility: Yes   Home Equipment: Cane - single point;Grab bars - tub/shower;Hand held shower head          Prior Functioning/Environment Prior Level of Function : Independent/Modified Independent;Driving;History of Falls (last six months)    Mobility Comments: independent w/out an AD ADLs Comments: independent    OT Problem List: Decreased strength;Pain;Impaired balance (sitting and/or standing);Decreased knowledge of use of DME or AE   OT Treatment/Interventions: Self-care/ADL training;Therapeutic exercise;Therapeutic activities;DME and/or AE instruction;Patient/family education;Balance training      OT Goals(Current goals can be found in the care plan section)   Acute Rehab OT Goals Patient Stated Goal: to use the bathroom OT Goal Formulation: With patient Time For Goal Achievement: 12/26/23 Potential to Achieve Goals: Good   OT Frequency:  Min 2X/week       AM-PAC OT 6 Clicks Daily Activity     Outcome Measure Help from another person eating meals?: None Help from another person taking care of personal grooming?: A Little Help from another person toileting, which includes using toliet, bedpan, or urinal?: Total Help from another person bathing (including washing, rinsing, drying)?: A Lot Help from another person to put on and taking off regular upper body clothing?: A Lot Help from another person to put on and taking off regular lower body clothing?: Total 6 Click Score: 13   End of Session Equipment Utilized  During Treatment: Gait belt;Rolling walker (2 wheels) Nurse Communication: Other (comment) (pt still on bed  pan)  Activity Tolerance: Treatment limited secondary to medical complications (Comment) (orthostatic BP) Patient left: in bed;with call bell/phone within reach;with bed alarm set;with family/visitor present  OT Visit Diagnosis: Unsteadiness on feet (R26.81);Repeated falls (R29.6);Muscle weakness (generalized) (M62.81);Pain Pain - Right/Left: Right Pain - part of body: Hip                Time: 8854-8767 OT Time Calculation (min): 47 min Charges:  OT General Charges $OT Visit: 1 Visit OT Evaluation $OT Eval High Complexity: 1 High OT Treatments $Self Care/Home Management : 23-37 mins  Leita Howell, OTR/L,CBIS  Supplemental OT - [facility] and [facility] Secure Chat Preferred    Anni Hocevar, Leita BIRCH 12/12/2023, 1:47 PM

## 2023-12-12 NOTE — Progress Notes (Signed)
 Physical Therapy Treatment Patient Details Name: Marie Day MRN: [id number] DOB: 08-05-1945 Today's Date: 12/12/2023   History of Present Illness Pt is a 78 y.o. F presenting to [facility] on 12/10/23 following a fall sustaining R hip fx. Pt is s/p ORIF on 12/10/23. PMH is significant for R breast CA, and osteoporosis.    PT Comments  Pt resting in bed on arrival, pleasant and agreeable to session with slow progress towards acute goals, secondary to continued symptomatic orthostatic hypotension. Donned ted hose prior to mobility with total A. Pt requiring grossly mod A to complete bed mobility and come to standing at EOB. Pt able to maintain ~30 seconds before max dizziness and needing to sit and ultimately return to supine, see all BP readings below. Educated pt on incrementally elevating HOB to progress tolerance for upright sitting/standing with pt agreeable and with HOB at 42 degrees at end of session. Pt continues to benefit from skilled PT services to progress toward functional mobility goals.    BP readings: Supine 116/49 101bpm Sitting 116/49 111bpm Standing 74/42 104bpm Trendelenburg 139/62 95bpm   If plan is discharge home, recommend the following: A little help with walking and/or transfers;A little help with bathing/dressing/bathroom;Assistance with cooking/housework;Assist for transportation;Help with stairs or ramp for entrance   Can travel by private vehicle     No  Equipment Recommendations  Rolling walker (2 wheels);BSC/3in1;Wheelchair (measurements PT);Wheelchair cushion (measurements PT)    Recommendations for Other Services       Precautions / Restrictions Precautions Precautions: Fall;Other (comment) Recall of Precautions/Restrictions: Intact Precaution/Restrictions Comments: orthostatic - Watch BP! Restrictions Weight Bearing Restrictions Per Provider Order: Yes RLE Weight Bearing Per Provider Order: Weight bearing as tolerated     Mobility  Bed  Mobility Overal bed mobility: Needs Assistance Bed Mobility: Rolling, Supine to Sit, Sit to Supine Rolling: Min assist, Used rails   Supine to sit: Mod assist, Used rails, HOB elevated Sit to supine: Max assist   General bed mobility comments: Physical assist required to manage RLE and bring off EOB. Pt was provided VC for technique and sequencing. Due to orthostatic BP, max A to quickly return pt to supine    Transfers Overall transfer level: Needs assistance Equipment used: Rolling walker (2 wheels) Transfers: Sit to/from Stand, Bed to chair/wheelchair/BSC Sit to Stand: Mod assist, From elevated surface           General transfer comment: Pt provided with VC for hand placement with RW management prior to sit to stand transition. pt becoming very dizzy with diaphoresis, unable to remain standing for BP reading    Ambulation/Gait                   Stairs             Wheelchair Mobility     Tilt Bed    Modified Rankin (Stroke Patients Only)       Balance Overall balance assessment: Needs assistance Sitting-balance support: No upper extremity supported, Feet supported, Single extremity supported, Bilateral upper extremity supported Sitting balance-Leahy Scale: Fair Sitting balance - Comments: seated EOB   Standing balance support: Bilateral upper extremity supported, Reliant on assistive device for balance Standing balance-Leahy Scale: Poor Standing balance comment: reliant on external support                            Communication Communication Communication: No apparent difficulties  Cognition Arousal: Alert Behavior During Therapy: [facility] for tasks  assessed/performed   PT - Cognitive impairments: No apparent impairments                         Following commands: Intact      Cueing Cueing Techniques: Verbal cues  Exercises      General Comments General comments (skin integrity, edema, etc.): BP supine with TED hose  donned 128/52, sitting EOB 116/49, stading 74/47, trendelenburg 139/62      Pertinent Vitals/Pain Pain Assessment Pain Assessment: Faces Faces Pain Scale: Hurts little more Pain Location: R hip Pain Descriptors / Indicators: Discomfort, Grimacing, Sore Pain Intervention(s): Monitored during session, Limited activity within patient's tolerance    Home Living Family/patient expects to be discharged to:: Private residence Living Arrangements: Spouse/significant other Available Help at Discharge: Family;Available PRN/intermittently Type of Home: House Home Access: Level entry     Alternate Level Stairs-Number of Steps: 4 steps up to kitchen or 4 steps down towards bedroom Home Layout: Multi-level Home Equipment: Cane - single point;Grab bars - tub/shower;Hand held shower head      Prior Function            PT Goals (current goals can now be found in the care plan section) Acute Rehab PT Goals Patient Stated Goal: to go home PT Goal Formulation: With patient Time For Goal Achievement: 12/25/23 Progress towards PT goals: Not progressing toward goals - comment (orhtostatic)    Frequency    Min 3X/week      PT Plan      Co-evaluation              AM-PAC PT 6 Clicks Mobility   Outcome Measure  Help needed turning from your back to your side while in a flat bed without using bedrails?: A Little Help needed moving from lying on your back to sitting on the side of a flat bed without using bedrails?: A Little Help needed moving to and from a bed to a chair (including a wheelchair)?: A Little Help needed standing up from a chair using your arms (e.g., wheelchair or bedside chair)?: A Little Help needed to walk in hospital room?: A Lot Help needed climbing 3-5 steps with a railing? : Total 6 Click Score: 15    End of Session Equipment Utilized During Treatment: Other (comment) (ted hose) Activity Tolerance: Other (comment) (orthostatics) Patient left: in bed;with  call bell/phone within reach;with family/visitor present Nurse Communication: Mobility status;Other (comment) (orthostatics, possible need for abdominal binder) PT Visit Diagnosis: Muscle weakness (generalized) (M62.81);History of falling (Z91.81);Pain Pain - Right/Left: Right Pain - part of body: Hip     Time: 8490-8465 PT Time Calculation (min) (ACUTE ONLY): 25 min  Charges:    $Therapeutic Activity: 23-37 mins PT General Charges $$ ACUTE PT VISIT: 1 Visit                     Dasani Thurlow R. PTA Acute Rehabilitation Services Office: [phone]   Therisa CHRISTELLA Boor 12/12/2023, 4:54 PM

## 2023-12-12 NOTE — TOC Progression Note (Signed)
 Transition of Care [facility]) - Progression Note    Patient Details  Name: Marie Day MRN: [id number] Date of Birth: [birth year]  Transition of Care [facility]) CM/SW Contact  Lauraine FORBES Saa, LCSW Phone Number: 12/12/2023, 3:26 PM  Clinical Narrative:     3:26 PM CSW provided current SNF options ([facility], [place], [address], [address]) with their quarterly Medicare ratings.  Expected Discharge Plan: Skilled Nursing Facility Barriers to Discharge: Continued Medical Work up, English as a second language teacher, SNF Pending bed offer  Expected Discharge Plan and Services In-house Referral: Clinical Social Work   Post Acute Care Choice: Skilled Nursing Facility Living arrangements for the past 2 months: Single Family Home                                       Social Determinants of Health (SDOH) Interventions SDOH Screenings   Food Insecurity: No Food Insecurity (12/10/2023)  Housing: Low Risk  (12/10/2023)  Transportation Needs: No Transportation Needs (12/10/2023)  Utilities: Not At Risk (12/10/2023)  Alcohol Screen: Low Risk  (10/10/2023)  Depression (PHQ2-9): Low Risk  (10/10/2023)  Financial Resource Strain: Low Risk  (10/10/2023)  Physical Activity: Inactive (10/10/2023)  Social Connections: Unknown (12/10/2023)  Stress: No Stress Concern Present (10/10/2023)  Tobacco Use: Low Risk  (12/10/2023)  Health Literacy: Adequate Health Literacy (10/10/2023)    Readmission Risk Interventions     No data to display

## 2023-12-12 NOTE — Plan of Care (Signed)
   Problem: Education: Goal: Knowledge of General Education information will improve Description: Including pain rating scale, medication(s)/side effects and non-pharmacologic comfort measures Outcome: Progressing   Problem: Activity: Goal: Risk for activity intolerance will decrease Outcome: Progressing   Problem: Pain Managment: Goal: General experience of comfort will improve and/or be controlled Outcome: Progressing

## 2023-12-12 NOTE — Progress Notes (Signed)
 Patient with complaints about itchiness around surgical site. RN assessed and patient has redness and blisters forming around surgical site. Cool cloth given to help relieve irritation. Provider notified. Waiting for response.

## 2023-12-13 DIAGNOSIS — S72331A Displaced oblique fracture of shaft of right femur, initial encounter for closed fracture: Secondary | ICD-10-CM | POA: Diagnosis not present

## 2023-12-13 LAB — CBC
HCT: 25.8 % — ABNORMAL LOW (ref 36.0–46.0)
Hemoglobin: 8.6 g/dL — ABNORMAL LOW (ref 12.0–15.0)
MCH: 29.6 pg (ref 26.0–34.0)
MCHC: 33.3 g/dL (ref 30.0–36.0)
MCV: 88.7 fL (ref 80.0–100.0)
Platelets: 192 K/uL (ref 150–400)
RBC: 2.91 MIL/uL — ABNORMAL LOW (ref 3.87–5.11)
RDW: 14 % (ref 11.5–15.5)
WBC: 9.7 K/uL (ref 4.0–10.5)
nRBC: 0 % (ref 0.0–0.2)

## 2023-12-13 LAB — BASIC METABOLIC PANEL WITH GFR
Anion gap: 9 (ref 5–15)
BUN: 9 mg/dL (ref 8–23)
CO2: 25 mmol/L (ref 22–32)
Calcium: 8.6 mg/dL — ABNORMAL LOW (ref 8.9–10.3)
Chloride: 103 mmol/L (ref 98–111)
Creatinine, Ser: 0.65 mg/dL (ref 0.44–1.00)
GFR, Estimated: >60 mL/min (ref >60)
Glucose, Bld: 126 mg/dL — ABNORMAL HIGH (ref 70–99)
Potassium: 3.8 mmol/L (ref 3.5–5.1)
Sodium: 137 mmol/L (ref 135–145)

## 2023-12-13 LAB — IRON AND TIBC
Iron: 20 ug/dL — ABNORMAL LOW (ref 28–170)
Saturation Ratios: 6 % — ABNORMAL LOW (ref 10.4–31.8)
TIBC: 353 ug/dL (ref 250–450)
UIBC: 333 ug/dL

## 2023-12-13 LAB — RETICULOCYTES
Immature Retic Fract: 28 % — ABNORMAL HIGH (ref 2.3–15.9)
RBC.: 2.98 MIL/uL — ABNORMAL LOW (ref 3.87–5.11)
Retic Count, Absolute: 62.9 K/uL (ref 19.0–186.0)
Retic Ct Pct: 2.1 % (ref 0.4–3.1)

## 2023-12-13 LAB — FOLATE: Folate: 12.3 ng/mL (ref >5.9)

## 2023-12-13 LAB — VITAMIN B12: Vitamin B-12: 202 pg/mL (ref 180–914)

## 2023-12-13 LAB — FERRITIN: Ferritin: 71 ng/mL (ref 11–307)

## 2023-12-13 MED ORDER — ASPIRIN 81 MG PO CHEW: 81.0000 mg | CHEWABLE_TABLET | Freq: Two times a day (BID) | ORAL | Status: AC

## 2023-12-13 MED ORDER — SENNA 8.6 MG PO TABS: 1.0000 | ORAL_TABLET | Freq: Two times a day (BID) | ORAL | Status: AC

## 2023-12-13 MED ORDER — ACETAMINOPHEN 500 MG PO TABS: 1000.0000 mg | ORAL_TABLET | Freq: Three times a day (TID) | ORAL | 0 refills | Status: DC

## 2023-12-13 MED ORDER — ACETAMINOPHEN 500 MG PO TABS: 1000.0000 mg | ORAL_TABLET | Freq: Three times a day (TID) | ORAL | Status: AC

## 2023-12-13 MED ORDER — LORATADINE 10 MG PO TABS
10.0000 mg | ORAL_TABLET | Freq: Every day | ORAL | Status: DC
  Administered 2023-12-13 – 2023-12-21 (×9): 10 mg via ORAL
  Filled 2023-12-13 (×9): qty 1

## 2023-12-13 MED ORDER — POLYETHYLENE GLYCOL 3350 17 G PO PACK: 17.0000 g | PACK | Freq: Every day | ORAL | Status: DC

## 2023-12-13 MED ORDER — POLYETHYLENE GLYCOL 3350 17 G PO PACK: 17.0000 g | PACK | Freq: Every day | ORAL | 0 refills | Status: DC

## 2023-12-13 MED ORDER — OXYCODONE HCL 5 MG PO TABS: 5.0000 mg | ORAL_TABLET | ORAL | 0 refills | Status: DC | PRN

## 2023-12-13 MED ORDER — ASPIRIN 81 MG PO CHEW: 81.0000 mg | CHEWABLE_TABLET | Freq: Two times a day (BID) | ORAL | 0 refills | Status: DC

## 2023-12-13 MED ORDER — SODIUM CHLORIDE 0.9 % IV BOLUS
1000.0000 mL | Freq: Once | INTRAVENOUS | Status: AC
  Administered 2023-12-13: 1000 mL via INTRAVENOUS

## 2023-12-13 MED ORDER — SENNA 8.6 MG PO TABS: 1.0000 | ORAL_TABLET | Freq: Two times a day (BID) | ORAL | 0 refills | Status: DC

## 2023-12-13 MED ORDER — DIPHENHYDRAMINE HCL 25 MG PO CAPS
25.0000 mg | ORAL_CAPSULE | Freq: Four times a day (QID) | ORAL | Status: DC | PRN
  Administered 2023-12-16 – 2023-12-19 (×2): 25 mg via ORAL
  Filled 2023-12-13 (×2): qty 1

## 2023-12-13 NOTE — TOC Progression Note (Signed)
 Transition of Care [facility]) - Progression Note    Patient Details  Name: Marie Day MRN: [id number] Date of Birth: [birth year]  Transition of Care [facility]) CM/SW Contact  Isaiah Public, LCSWA Phone Number: 12/13/2023, 3:32 PM  Clinical Narrative:     CSW spoke with patient regarding patients SNF choice. Patient requested that CSW to call her spouse Elsie to help with her SNF choice. CSW spoke with patients spouse Elsie and provided SNF bed offers. Patients spouse accepted SNF bed offer with [facility]. CSW awaiting to hear back from [place] admissions to confirm SNF bed for patient. CSW will continue to follow.   Expected Discharge Plan: Skilled Nursing Facility Barriers to Discharge: Continued Medical Work up, English as a second language teacher, SNF Pending bed offer               Expected Discharge Plan and Services In-house Referral: Clinical Social Work   Post Acute Care Choice: Skilled Nursing Facility Living arrangements for the past 2 months: Single Family Home                                       Social Drivers of Health (SDOH) Interventions SDOH Screenings   Food Insecurity: No Food Insecurity (12/10/2023)  Housing: Low Risk  (12/10/2023)  Transportation Needs: No Transportation Needs (12/10/2023)  Utilities: Not At Risk (12/10/2023)  Alcohol Screen: Low Risk  (10/10/2023)  Depression (PHQ2-9): Low Risk  (10/10/2023)  Financial Resource Strain: Low Risk  (10/10/2023)  Physical Activity: Inactive (10/10/2023)  Social Connections: Unknown (12/10/2023)  Stress: No Stress Concern Present (10/10/2023)  Tobacco Use: Low Risk  (12/10/2023)  Health Literacy: Adequate Health Literacy (10/10/2023)    Readmission Risk Interventions     No data to display

## 2023-12-13 NOTE — Plan of Care (Signed)
   Problem: Education: Goal: Knowledge of General Education information will improve Description: Including pain rating scale, medication(s)/side effects and non-pharmacologic comfort measures Outcome: Progressing   Problem: Activity: Goal: Risk for activity intolerance will decrease Outcome: Progressing

## 2023-12-13 NOTE — Progress Notes (Signed)
 PROGRESS NOTE  Marie Day  FMW:[id number] DOB: 01-16-1946 DOA: 12/10/2023 PCP: Kennyth Worth [name], MD  Consultants  Brief Narrative: 78 y.o. female with a PMH significant for breast cancer s/p mastectomy and chemoradiation, and osteoporosis on Fosamax .  Patient was admitted with acute intertrochanteric fracture of the right femoral neck following a mechanical fall.  Patient underwent right intertrochanteric fracture open reduction on and internal fixation with cephalomedullary rod on 12/10/2023.  Pain well-controlled since admission, was having some issues with muscle spasms but this resolved status post baclofen  administration.  Moving towards discharge to SNF     Assessment & Plan:   Right intertrochanteric femoral neck fracture: - Patient underwent surgery for right femoral neck fracture on 12/10/2023.   - Patient is stable postop.  Postop care as per orthopedic surgery team.  Patient is on aspirin  81 Mg p.o. twice daily for prophylaxis.  She has had some muscle cramping in the legs around her hip but this is improved status post baclofen  -Continue pain control and await SNF placement -Did have some itching at bandage.  Orthopedics wrote for Benadryl .  Switch to Zyrtec during the day to help with itching and can use Benadryl  at night to prevent grogginess during the day.   History of right breast cancer: - Status post right mastectomy and chemoradiation therapy.  Followed by oncology outpatient   Osteoporosis: On Fosamax  outpt   Chronic anemia:  - Baseline appears to be around 10.  She did have a drop after her surgery.  We will trend her hemoglobin.  No overt signs of bleeding - Stable since surgery, iron was low although ferritin good.    Mixed hyperlipidemia: Currently not on any treatment       DVT prophylaxis:  SCDs Start: 12/10/23 1306  Code Status:   Code Status: Full Code Family Communication: Daughter on phone, discussed care during my exam Level of care:  Med-Surg Status is: Inpatient Dispo: Moving towards discharge to SNF when bed available.   Consults called: Orthopedics  Subjective: Patient awake and alert.  Feels well.  Reports that pain is well-controlled.  Muscle cramping better than yesterday.  Objective: Vitals:   12/12/23 1452 12/12/23 2013 12/13/23 0534 12/13/23 0755  BP: (!) 129/51 121/62 (!) 117/47 (!) 103/52  Pulse: (!) 106 100 92   Resp: 17 16 16 17   Temp: 99.5 F (37.5 C) 98.9 F (37.2 C) 98.4 F (36.9 C) 98.6 F (37 C)  TempSrc: Oral Oral Oral   SpO2: 93% 94% 96% 97%  Weight:      Height:        Intake/Output Summary (Last 24 hours) at 12/13/2023 1552 Last data filed at 12/13/2023 0900 Gross per 24 hour  Intake 240 ml  Output --  Net 240 ml   Filed Weights   12/10/23 1115  Weight: 80.3 kg   Body mass index is 30.38 kg/m.  Gen: 78 y.o. female in no apparent distress.  Nontoxic Pulm: Non-labored breathing.  Clear to auscultation bilaterally.  CV: Regular rate and rhythm. No murmur, rub, or gallop. No JVD GI: Abdomen soft, non-tender, non-distended Ext: Warm, no deformities, bandage for hip c/di/ Skin: No rashes, lesions  Neuro: Alert and oriented. No focal neurological deficits. Psych: Calm  Judgement and insight appear normal. Mood & affect appropriate.    I have personally reviewed the following labs and images: CBC: Recent Labs  Lab 12/10/23 1138 12/11/23 0634 12/12/23 0912 12/13/23 0515  WBC 6.7 12.4* 9.8 9.7  NEUTROABS 3.4  --   --   --  HGB 11.6* 8.9* 8.3* 8.6*  HCT 36.0 27.6* 25.2* 25.8*  MCV 89.3 89.0 87.5 88.7  PLT 238 276 221 192   BMP &GFR Recent Labs  Lab 12/10/23 1138 12/11/23 0634 12/12/23 0912 12/13/23 0515  NA 140 136 135 137  K 3.8 4.2 3.9 3.8  CL 106 101 102 103  CO2 25 23 24 25   GLUCOSE 118* 145* 118* 126*  BUN 10 9 10 9   CREATININE 0.67 0.82 0.76 0.65  CALCIUM 9.2 8.4* 8.4* 8.6*   Estimated Creatinine Clearance: 61.3 mL/min (by C-G formula based on SCr  of 0.65 mg/dL). Liver & Pancreas: Recent Labs  Lab 12/10/23 1138  AST 33  ALT 35  ALKPHOS 56  BILITOT 0.7  PROT 6.3*  ALBUMIN 3.7   No results for input(s): LIPASE, AMYLASE in the last 168 hours. No results for input(s): AMMONIA in the last 168 hours. Diabetic: No results for input(s): HGBA1C in the last 72 hours. No results for input(s): GLUCAP in the last 168 hours. Cardiac Enzymes: No results for input(s): CKTOTAL, CKMB, CKMBINDEX, TROPONINI in the last 168 hours. No results for input(s): PROBNP in the last 8760 hours. Coagulation Profile: Recent Labs  Lab 12/10/23 1115  INR 1.0   Thyroid  Function Tests: No results for input(s): TSH, T4TOTAL, FREET4, T3FREE, THYROIDAB in the last 72 hours. Lipid Profile: No results for input(s): CHOL, HDL, LDLCALC, TRIG, CHOLHDL, LDLDIRECT in the last 72 hours. Anemia Panel: Recent Labs    12/13/23 0515  VITAMINB12 202  FOLATE 12.3  FERRITIN 71  TIBC 353  IRON 20*  RETICCTPCT 2.1   Urine analysis: No results found for: COLORURINE, APPEARANCEUR, LABSPEC, PHURINE, GLUCOSEU, HGBUR, BILIRUBINUR, KETONESUR, PROTEINUR, UROBILINOGEN, NITRITE, LEUKOCYTESUR Sepsis Labs: Invalid input(s): PROCALCITONIN, LACTICIDVEN  Microbiology: Recent Results (from the past 240 hours)  Surgical pcr screen     Status: None   Collection Time: 12/10/23  3:18 PM   Specimen: Nasal Mucosa; Nasal Swab  Result Value Ref Range Status   MRSA, PCR NEGATIVE NEGATIVE Final   Staphylococcus aureus NEGATIVE NEGATIVE Final    Comment: (NOTE) The Xpert SA Assay (FDA approved for NASAL specimens in patients 50 years of age and older), is one component of a comprehensive surveillance program. It is not intended to diagnose infection nor to guide or monitor treatment. Performed at [facility], [address], [place], [place] [address]     Radiology Studies: No results  found.  Scheduled Meds:  acetaminophen   1,000 mg Oral Q8H   aspirin   81 mg Oral BID   feeding supplement  237 mL Oral BID BM   loratadine   10 mg Oral Daily   polyethylene glycol  17 g Oral Daily   senna  1 tablet Oral BID   Continuous Infusions:   LOS: 3 days   35 minutes with more than 50% spent in reviewing records, counseling patient/family and coordinating care.  Reyes VEAR Gaw, MD [facility] [email or web address] 12/13/2023, 3:52 PM

## 2023-12-13 NOTE — Progress Notes (Signed)
 Orthopedic Surgery Progress Note   Assessment: Patient is a 78 y.o. female with right intertrochanteric femur fracture status post CMN   Plan: -Operative plans: complete -Diet: regular -DVT ppx: aspirin  81mg  BID -Added oral benadryl  for her itching and reaction to the dressing. Do not want to change dressing since it was placed sterile in the OR -Weight bearing status: as tolerated -PT evaluate and treat -Pain control -Dispo: per primary  ___________________________________________________________________________  Subjective: No acute events overnight. Has had some itching and blister formation around the adhesive of her proximal bandages. Pain has gotten better since immediately post-operatively but still having difficulty mobilizing enough to take care of herself at home.    Physical Exam:  General: no acute distress, appears stated age Neurologic: alert, answering questions appropriately, following commands Respiratory: unlabored breathing on room air, symmetric chest rise Psychiatric: appropriate affect, normal cadence to speech  MSK:   -Right lower extremity  Dressings over proximal incision with slight amount of dry blood otherwise dressings c/d/i EHL/TA/GSC intact Plantarflexes and dorsiflexes toes Sensation intact to light touch in sural, saphenous, tibial, deep peroneal, and superficial peroneal nerve distributions Foot warm and well perfused   Patient name: Marie Day Patient MRN: [id number] Date: 12/13/23

## 2023-12-14 DIAGNOSIS — S72331A Displaced oblique fracture of shaft of right femur, initial encounter for closed fracture: Secondary | ICD-10-CM | POA: Diagnosis not present

## 2023-12-14 MED ORDER — BACLOFEN 10 MG PO TABS: 10.0000 mg | ORAL_TABLET | Freq: Three times a day (TID) | ORAL | Status: DC | PRN

## 2023-12-14 MED ORDER — SODIUM CHLORIDE 0.9 % IV BOLUS
500.0000 mL | Freq: Once | INTRAVENOUS | Status: AC
  Administered 2023-12-14: 500 mL via INTRAVENOUS

## 2023-12-14 NOTE — Hospital Course (Addendum)
 78 y.o. female with a PMH significant for breast cancer s/p mastectomy and chemoradiation, and osteoporosis on Fosamax  admitted with acute intertrochanteric fracture of the right femoral neck following a mechanical fall. She underwent right intertrochanteric fracture open reduction on and internal fixation with cephalomedullary rod on 12/10/2023.  Postop seen by PT OT pain managed continued wound care.patient was having some issues with muscle spasms but this resolved status post baclofen  administration.Awaiting for skilled nursing facility placement. PT OT working with her at this time recommending home health and patient is eager to go home  Subjective: Seen and examined Resting comfortably, mobilizing better Overnight afebrile BP stable labs reviewed from 7/29 and overall unchanged with chronic anemia/ some abla Waiting for placement still She tells me she does not feel ready for discharge to home today  Assessment and plan:  Right intertrochanteric femoral neck fracture: S/p ORIF 12/10/2023.  Postop doing well, On aspirin  bid for DVT prophylaxis. Cont pain control w/ multimodal approach opiates and opiates. Awaintg SNF> but now mobilizing well, so wants to go to home w/ [facility], walker  Symptomatic orthostasis hypotension 7/24 w/ PT session: S/p IV fluid bolus. On Compression stocking. BP stable  History of right breast cancer: S/P [name] and chemoradiation therapy. Followed by oncology outpatient   Osteoporosis: On Fosamax  as outpt-resume on d/c.   Chronic anemia:  close to baseline although some drop postop. Monitor as outpatient. Iron was low although ferritin good. Add po iron.  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  DVT prophylaxis: SCDs Start: 12/10/23 1306 Code Status:   Code Status: Full Code Family Communication: plan  of care discussed with patient at bedside. Patient status is: Remains hospitalized because of severity of illness Level of care: Med-Surg   Dispo: The patient is from: home            Anticipated disposition: home w/ [facility] anticipating tomorrow once mobilizing better and pain is controlled.  She does not feel ready today  Objective: Vitals last 24 hrs: Vitals:   12/19/23 0550 12/19/23 0753 12/20/23 0437 12/20/23 0743  BP: (!) 140/66 (!) 120/59 (!) 119/46 116/61  Pulse: 96 81 83 89  Resp: 18 18 18 17   Temp: 98.3 F (36.8 C) 98.7 F (37.1 C) 98.2 F (36.8 C) 98.1 F (36.7 C)  TempSrc: Oral Oral Oral Oral  SpO2: 99% 95% 97% 97%  Weight:      Height:       Physical Examination: General exam: AAO X3, pleasant conversant  HEENT:Oral mucosa moist, Ear/Nose WNL grossly Respiratory system: Bilaterally clear BS,no use of accessory muscle Cardiovascular system: S1 & S2 +, No JVD. Gastrointestinal system: Abdomen soft,NT,ND, BS+ Nervous System: Alert, awake, moving all extremities,and following commands. Extremities: Right hip surgical site with dressing in place C/D/I, bruise present  Skin: No rashes,no icterus. MSK: Normal muscle bulk,tone, power

## 2023-12-14 NOTE — Progress Notes (Signed)
 PROGRESS NOTE Marie Day  FMW:[id number] DOB: 09-03-1945 DOA: 12/10/2023 PCP: Kennyth Worth [name], MD  Brief Narrative/Hospital Course: 78 y.o. female with a PMH significant for breast cancer s/p mastectomy and chemoradiation, and osteoporosis on Fosamax  admitted with acute intertrochanteric fracture of the right femoral neck following a mechanical fall. She underwent right intertrochanteric fracture open reduction on and internal fixation with cephalomedullary rod on 12/10/2023.  Postop seen by PT OT pain managed continued wound care.patient was having some issues with muscle spasms but this resolved status post baclofen  administration. Awaiting for skilled nursing facility placement.  Subjective: Seen examined Overnight afebrile BP stable on room air Labs reviewed from yesterday with chronic anemia stable electrolytes Having spasm that improves with  baclofen  Also slightly dizzy while working with PT hypertensive BP up to 76/58  Assessment and plan: Right intertrochanteric femoral neck fracture: S/p ORIF 12/10/2023.  Postop doing well, per orthopedics plan for aspirin  twice daily for DVT prophylaxis, along with pain management PT OT rehab placement  Symptomatic orthostasis hypertension: Will give fluid bolus, compression stocking  History of right breast cancer: Status post right mastectomy and chemoradiation therapy.  Followed by oncology outpatient   Osteoporosis: On Fosamax  outpt   Chronic anemia:  Hemoglobin close to baseline although some drop postop.  Monitor as outpatient. Iron was low although ferritin good.  Hyperlipidemia: Follow-up with PCP   Class I Obesity w/ Body mass index is 30.38 kg/m.: Will benefit with PCP follow-up, weight loss,healthy lifestyle and outpatient sleep eval if not done.  DVT prophylaxis: SCDs Start: 12/10/23 1306 Code Status:   Code Status: Full Code Family Communication: plan of care discussed with patient at bedside. Patient status is:  Remains hospitalized because of severity of illness Level of care: Med-Surg   Dispo: The patient is from: home            Anticipated disposition: SNF Objective: Vitals last 24 hrs: Vitals:   12/13/23 1600 12/13/23 1900 12/14/23 0347 12/14/23 0807  BP: (!) 146/63 139/65 (!) 140/77 (!) 123/49  Pulse: 97 89 97 90  Resp: 18 18 19 18   Temp: 98 F (36.7 C) 98.3 F (36.8 C) 98.2 F (36.8 C) 98.5 F (36.9 C)  TempSrc:  Oral Oral Oral  SpO2: 96% 97% 100% 97%  Weight:      Height:        Physical Examination: General exam: alert awake, older than stated age HEENT:Oral mucosa moist, Ear/Nose WNL grossly Respiratory system: Bilaterally CLEAR BS, no use of accessory muscle Cardiovascular system: S1 & S2 +. Gastrointestinal system: Abdomen soft,  NT,ND,BS+ Nervous System: Alert, awake, following commands. Extremities: LE edema neg, warm extremities, right hip with dressing in place Skin: No rashes,warm. MSK: Normal muscle bulk/tone.   Data Reviewed: I have personally reviewed following labs and imaging studies ( see epic result tab) CBC: Recent Labs  Lab 12/10/23 1138 12/11/23 0634 12/12/23 0912 12/13/23 0515  WBC 6.7 12.4* 9.8 9.7  NEUTROABS 3.4  --   --   --   HGB 11.6* 8.9* 8.3* 8.6*  HCT 36.0 27.6* 25.2* 25.8*  MCV 89.3 89.0 87.5 88.7  PLT 238 276 221 192   CMP: Recent Labs  Lab 12/10/23 1138 12/11/23 0634 12/12/23 0912 12/13/23 0515  NA 140 136 135 137  K 3.8 4.2 3.9 3.8  CL 106 101 102 103  CO2 25 23 24 25   GLUCOSE 118* 145* 118* 126*  BUN 10 9 10 9   CREATININE 0.67 0.82 0.76 0.65  CALCIUM  9.2 8.4* 8.4* 8.6*   GFR: Estimated Creatinine Clearance: 61.3 mL/min (by C-G formula based on SCr of 0.65 mg/dL). Recent Labs  Lab 12/10/23 1138  AST 33  ALT 35  ALKPHOS 56  BILITOT 0.7  PROT 6.3*  ALBUMIN 3.7   No results for input(s): LIPASE, AMYLASE in the last 168 hours. No results for input(s): AMMONIA in the last 168 hours. Coagulation Profile:   Recent Labs  Lab 12/10/23 1115  INR 1.0   Unresulted Labs (From admission, onward)    None      Antimicrobials/Microbiology: Anti-infectives (From admission, onward)    Start     Dose/Rate Route Frequency Ordered Stop   12/10/23 2000  ceFAZolin  (ANCEF ) IVPB 2g/100 mL premix        2 g 200 mL/hr over 30 Minutes Intravenous Every 6 hours 12/10/23 1845 12/11/23 1634   12/10/23 1300  ceFAZolin  (ANCEF ) IVPB 2g/100 mL premix        2 g 200 mL/hr over 30 Minutes Intravenous To Surgery 12/10/23 1257 12/11/23 1635      No results found for: SDES, SPECREQUEST, CULT, REPTSTATUS  Procedures: Procedure(s) (LRB): FIXATION, FRACTURE, INTERTROCHANTERIC, WITH INTRAMEDULLARY ROD (Right) Medications reviewed:  Scheduled Meds:  acetaminophen   1,000 mg Oral Q8H   aspirin   81 mg Oral BID   feeding supplement  237 mL Oral BID BM   loratadine   10 mg Oral Daily   polyethylene glycol  17 g Oral Daily   senna  1 tablet Oral BID   Continuous Infusions:  sodium chloride       [name] LAMY, MD [facility] 12/14/2023, 10:34 AM

## 2023-12-14 NOTE — Progress Notes (Addendum)
 Physical Therapy Treatment Patient Details Name: Marie Day MRN: [id number] DOB: 11-Sep-1945 Today's Date: 12/14/2023   History of Present Illness Pt is a 78 y.o. F presenting to [facility] on 12/10/23 following a fall sustaining R hip fx. Pt is s/p ORIF on 12/10/23. PMH is significant for R breast CA, and osteoporosis.    PT Comments  Pt resting in bed on arrival, eager for mobility and demonstrating steady progress towards acute goals, however continues to be limited by symptomatic orthostatic hypotension with positional changes. Pt able to come to sitting EOB with mod A to manage LLE and trunk. Pt standing from EOB with mod A with cues for optimal hand placement and able to step pivot EOB>BSC with min A with RW for support and cues for LE sequencing. Pt standing from [facility] for peri-care with mod A, pt then becoming dizzy, needing to return to sitting prior to transfer back to bed. Pt able to stand pivot with max A with face to face technique for quick transfer back EOB and to supine. BP recovering at end of session in supine with pt endorsing resolution of symptoms. Continued education on incremental elevation of HOB throughout day and exercises to complete between therapies. See all BP readings below in general comments. Patient will benefit from continued inpatient follow up therapy, <3 hours/day, will continue to follow acutely.     If plan is discharge home, recommend the following: A little help with walking and/or transfers;A little help with bathing/dressing/bathroom;Assistance with cooking/housework;Assist for transportation;Help with stairs or ramp for entrance   Can travel by private vehicle     No  Equipment Recommendations  Rolling walker (2 wheels);BSC/3in1;Wheelchair (measurements PT);Wheelchair cushion (measurements PT)    Recommendations for Other Services       Precautions / Restrictions Precautions Precautions: Fall;Other (comment) Recall of Precautions/Restrictions:  Intact Precaution/Restrictions Comments: orthostatic - Watch BP! Restrictions Weight Bearing Restrictions Per Provider Order: Yes RLE Weight Bearing Per Provider Order: Weight bearing as tolerated     Mobility  Bed Mobility Overal bed mobility: Needs Assistance Bed Mobility: Supine to Sit, Sit to Supine     Supine to sit: Mod assist, Used rails, HOB elevated Sit to supine: Max assist   General bed mobility comments: Physical assist required to manage RLE and bring off EOB. Pt was provided VC for technique and sequencing. Due to orthostatic BP, max A to quickly return pt to supine    Transfers Overall transfer level: Needs assistance Equipment used: Rolling walker (2 wheels) Transfers: Sit to/from Stand, Bed to chair/wheelchair/BSC Sit to Stand: Mod assist, From elevated surface Stand pivot transfers: Max assist Step pivot transfers: Min assist       General transfer comment: Pt provided with VC for hand placement with RW management prior to sit to stand transition from EOB and BSC. pt able to step pivot with RW for support from EOB>BSC without dizziness. with increased time up on [facility] pt becoming dizzy, pt standing and needing to sit before transfer back to bed initiatated BP 76/58 (65), pt trasnfered with max A with face to face technique and stand pivot for speed to return to pt to supine, BP recovering <21min 143/57 (81)    Ambulation/Gait                   Stairs             Wheelchair Mobility     Tilt Bed    Modified Rankin (Stroke Patients Only)  Balance Overall balance assessment: Needs assistance Sitting-balance support: No upper extremity supported, Feet supported, Single extremity supported, Bilateral upper extremity supported Sitting balance-Leahy Scale: Fair Sitting balance - Comments: seated EOB   Standing balance support: Bilateral upper extremity supported, Reliant on assistive device for balance Standing balance-Leahy Scale:  Poor Standing balance comment: reliant on external support                            Communication Communication Communication: No apparent difficulties  Cognition Arousal: Alert Behavior During Therapy: WFL for tasks assessed/performed   PT - Cognitive impairments: No apparent impairments                         Following commands: Intact      Cueing Cueing Techniques: Verbal cues  Exercises      General Comments General comments (skin integrity, edema, etc.): BP supine with TED hose donned 135/80, sitting EOB 142/54 (78) , sitting on BSC post trasnfer 100/62 (74), directly after standing from [facility] 76/58 (65), supine post transfer 143/57 (81)      Pertinent Vitals/Pain Pain Assessment Pain Assessment: Faces Faces Pain Scale: Hurts little more Pain Location: R hip Pain Descriptors / Indicators: Discomfort, Grimacing, Sore Pain Intervention(s): Monitored during session, Limited activity within patient's tolerance    Home Living                          Prior Function            PT Goals (current goals can now be found in the care plan section) Acute Rehab PT Goals Patient Stated Goal: to go home PT Goal Formulation: With patient Time For Goal Achievement: 12/25/23 Progress towards PT goals: Progressing toward goals    Frequency    Min 3X/week      PT Plan      Co-evaluation              AM-PAC PT 6 Clicks Mobility   Outcome Measure  Help needed turning from your back to your side while in a flat bed without using bedrails?: A Little Help needed moving from lying on your back to sitting on the side of a flat bed without using bedrails?: A Little Help needed moving to and from a bed to a chair (including a wheelchair)?: A Little Help needed standing up from a chair using your arms (e.g., wheelchair or bedside chair)?: A Little Help needed to walk in hospital room?: A Lot Help needed climbing 3-5 steps with a  railing? : Total 6 Click Score: 15    End of Session Equipment Utilized During Treatment: Other (comment) (ted hose) Activity Tolerance: Other (comment);Patient tolerated treatment well (orthostatics) Patient left: in bed;with call bell/phone within reach Nurse Communication: Mobility status;Other (comment) (orthostatics, possible need for abdominal binder) PT Visit Diagnosis: Muscle weakness (generalized) (M62.81);History of falling (Z91.81);Pain Pain - Right/Left: Right Pain - part of body: Hip     Time: 0850-0928 PT Time Calculation (min) (ACUTE ONLY): 38 min  Charges:    $Gait Training: 8-22 mins $Therapeutic Activity: 23-37 mins PT General Charges $$ ACUTE PT VISIT: 1 Visit                     Ishana Blades R. PTA Acute Rehabilitation Services Office: [phone]   Therisa CHRISTELLA Boor 12/14/2023, 9:41 AM

## 2023-12-14 NOTE — Progress Notes (Signed)
 Occupational Therapy Treatment Patient Details Name: Marie Day MRN: [id number] DOB: [birth year] Today's Date: 12/14/2023   History of present illness Pt is a 78 y.o. F presenting to [facility] on 12/10/23 following a fall sustaining R hip fx. Pt is s/p ORIF on 12/10/23. PMH is significant for R breast CA, and osteoporosis.   OT comments  Pt making progress with functional goals. Pt sitting EOB upon OT arrvial, husband attempting to assist her to [facility] without calling for nursing assist. Pt with no c/o dizziness; pt and husband educated on fall prevention/safety by calling for assist for getting on BSC. OT assisted pt to stand at RW max A , SPT to commode mod A, clothing mgt and hygiene mod A, UB dressing Sup, pt stood at [facility] for anterior hygiene assist then SPT to chair mod A. OT will continue to follow acutely to maximize level of function and safety      If plan is discharge home, recommend the following:  A lot of help with walking and/or transfers;A lot of help with bathing/dressing/bathroom;Assistance with cooking/housework;Assist for transportation;Help with stairs or ramp for entrance   Equipment Recommendations  Other (comment);BSC/3in1;Tub/shower seat    Recommendations for Other Services      Precautions / Restrictions Precautions Precautions: Fall;Other (comment) Recall of Precautions/Restrictions: Intact Precaution/Restrictions Comments: orthostatic - Watch BP! Restrictions Weight Bearing Restrictions Per Provider Order: Yes RLE Weight Bearing Per Provider Order: Weight bearing as tolerated       Mobility Bed Mobility               General bed mobility comments: pt sitting EOB upon arrial, hsuband attempting to assist her to [facility] without calling for nursing assist    Transfers Overall transfer level: Needs assistance Equipment used: Rolling walker (2 wheels) Transfers: Sit to/from Stand, Bed to chair/wheelchair/BSC Sit to Stand: Mod assist, From elevated  surface Stand pivot transfers: Mod assist         General transfer comment: no c/o dizziness. pt and husband educated on fall prevention/safety by calling for assist for getting on [facility]     Balance Overall balance assessment: Needs assistance Sitting-balance support: No upper extremity supported, Feet supported, Single extremity supported, Bilateral upper extremity supported Sitting balance-Leahy Scale: Fair Sitting balance - Comments: seated EOB   Standing balance support: Bilateral upper extremity supported, Reliant on assistive device for balance Standing balance-Leahy Scale: Poor                             ADL either performed or assessed with clinical judgement   ADL Overall ADL's : Needs assistance/impaired     Grooming: Wash/dry hands;Wash/dry face;Set up;Sitting           Upper Body Dressing : Supervision/safety;Set up;Sitting       Toilet Transfer: Moderate assistance;Stand-pivot;Rolling walker (2 wheels);Cueing for safety;BSC/3in1   Toileting- Clothing Manipulation and Hygiene: Moderate assistance;Sit to/from stand       Functional mobility during ADLs: Moderate assistance;Cueing for safety;Rolling walker (2 wheels) General ADL Comments: pt sitting EOB upon arrial, hsuband attempting to assist her to [facility] without calling for nursing assist    Extremity/Trunk Assessment Upper Extremity Assessment Upper Extremity Assessment: Generalized weakness;Right hand dominant   Lower Extremity Assessment Lower Extremity Assessment: Defer to PT evaluation        Vision Ability to See in Adequate Light: 0 Adequate Patient Visual Report: No change from baseline     Perception  Praxis     Communication Communication Communication: No apparent difficulties   Cognition Arousal: Alert Behavior During Therapy: WFL for tasks assessed/performed Cognition: No apparent impairments                               Following commands:  Intact        Cueing   Cueing Techniques: Verbal cues  Exercises      Shoulder Instructions       General Comments      Pertinent Vitals/ Pain       Pain Assessment Pain Assessment: Faces Faces Pain Scale: Hurts little more Pain Location: R hip Pain Descriptors / Indicators: Discomfort, Grimacing, Sore Pain Intervention(s): Monitored during session, Premedicated before session, Repositioned  Home Living                                          Prior Functioning/Environment              Frequency  Min 2X/week        Progress Toward Goals  OT Goals(current goals can now be found in the care plan section)  Progress towards OT goals: Progressing toward goals     Plan      Co-evaluation                 AM-PAC OT 6 Clicks Daily Activity     Outcome Measure   Help from another person eating meals?: None Help from another person taking care of personal grooming?: A Little Help from another person toileting, which includes using toliet, bedpan, or urinal?: A Lot Help from another person bathing (including washing, rinsing, drying)?: A Lot Help from another person to put on and taking off regular upper body clothing?: A Little Help from another person to put on and taking off regular lower body clothing?: Total 6 Click Score: 15    End of Session Equipment Utilized During Treatment: Gait belt;Rolling walker (2 wheels);Other (comment) (BSC)  OT Visit Diagnosis: Unsteadiness on feet (R26.81);Repeated falls (R29.6);Muscle weakness (generalized) (M62.81);Pain Pain - Right/Left: Right Pain - part of body: Hip   Activity Tolerance Patient tolerated treatment well   Patient Left with call bell/phone within reach;with family/visitor present;in chair;with chair alarm set   Nurse Communication Mobility status (pt sitting EOB upon arrial, hsuband attempting to assist her to [facility] without calling for nursing assist)        Time:  1424-1449 OT Time Calculation (min): 25 min  Charges: OT General Charges $OT Visit: 1 Visit OT Treatments $Self Care/Home Management : 8-22 mins $Therapeutic Activity: 8-22 mins    Jacques Karna Loose 12/14/2023, 3:06 PM

## 2023-12-14 NOTE — Plan of Care (Signed)

## 2023-12-14 NOTE — Progress Notes (Signed)
 Orthopedic Surgery Progress Note   Assessment: Patient is a 78 y.o. female with right intertrochanteric femur fracture status post CMN   Plan: -Operative plans: complete -Diet: regular -DVT ppx: aspirin  81mg  BID -Weight bearing status: as tolerated -PT evaluate and treat -Pain control -Dispo: SNF  ___________________________________________________________________________  Subjective: No acute events overnight. Patient feels pain has been getting better. Did not take any narcotics yesterday. Feels muscle spasms when moving to get out of bed and feels baclofen  is helpful for that.    Physical Exam:  General: no acute distress, appears stated age Neurologic: alert, answering questions appropriately, following commands Respiratory: unlabored breathing on room air, symmetric chest rise Psychiatric: appropriate affect, normal cadence to speech  MSK:   -Right lower extremity  Dressings over proximal incision with slight amount of dry blood (unchanged from yesterday) otherwise dressings c/d/i EHL/TA/GSC intact Plantarflexes and dorsiflexes toes Sensation intact to light touch in sural, saphenous, tibial, deep peroneal, and superficial peroneal nerve distributions Foot warm and well perfused   Patient name: Marie Day Patient MRN: [id number] Date: 12/14/23

## 2023-12-15 DIAGNOSIS — S72331A Displaced oblique fracture of shaft of right femur, initial encounter for closed fracture: Secondary | ICD-10-CM | POA: Diagnosis not present

## 2023-12-15 MED ORDER — LUBRIDERM SERIOUSLY SENSITIVE EX LOTN
TOPICAL_LOTION | Freq: Four times a day (QID) | CUTANEOUS | Status: DC | PRN
  Filled 2023-12-15: qty 473

## 2023-12-15 NOTE — TOC Progression Note (Signed)
 Transition of Care [facility]) - Progression Note    Patient Details  Name: Marie Day MRN: [id number] Date of Birth: 12/26/1945  [facility] CM/SW Contact  Gwenn Frieze [place], [place] Phone Number: 12/15/2023, 1:32 PM  Clinical Narrative:  Beatris to Damian [phone] with [facility] admissions who confirmed bed offer and that they will start auth with [facility]. SW will follow and provide updates as available.   Frieze Gwenn, MSW, LCSW [phone] (coverage)       Expected Discharge Plan: Skilled Nursing Facility Barriers to Discharge: Continued Medical Work up, English as a second language teacher, SNF Pending bed offer               Expected Discharge Plan and Services In-house Referral: Clinical Social Work   Post Acute Care Choice: Skilled Nursing Facility Living arrangements for the past 2 months: Single Family Home                                       Social Drivers of Health (SDOH) Interventions SDOH Screenings   Food Insecurity: No Food Insecurity (12/10/2023)  Housing: Low Risk  (12/10/2023)  Transportation Needs: No Transportation Needs (12/10/2023)  Utilities: Not At Risk (12/10/2023)  Alcohol Screen: Low Risk  (10/10/2023)  Depression (PHQ2-9): Low Risk  (10/10/2023)  Financial Resource Strain: Low Risk  (10/10/2023)  Physical Activity: Inactive (10/10/2023)  Social Connections: Unknown (12/10/2023)  Stress: No Stress Concern Present (10/10/2023)  Tobacco Use: Low Risk  (12/10/2023)  Health Literacy: Adequate Health Literacy (10/10/2023)    Readmission Risk Interventions     No data to display

## 2023-12-15 NOTE — Progress Notes (Signed)
 PROGRESS NOTE ALIVEAH Day  FMW:[id number] DOB: Sep 10, 1945 DOA: 12/10/2023 PCP: Kennyth Worth [name], MD  Brief Narrative/Hospital Course: 78 y.o. female with a PMH significant for breast cancer s/p mastectomy and chemoradiation, and osteoporosis on Fosamax  admitted with acute intertrochanteric fracture of the right femoral neck following a mechanical fall. She underwent right intertrochanteric fracture open reduction on and internal fixation with cephalomedullary rod on 12/10/2023.  Postop seen by PT OT pain managed continued wound care.patient was having some issues with muscle spasms but this resolved status post baclofen  administration. Awaiting for skilled nursing facility placement.  Subjective: Patient seen examined this morning Resting comfortably in the bedside chair. Overnight afebrile BP stable on room air Continues to need baclofen   Assessment and plan:  Right intertrochanteric femoral neck fracture: S/p ORIF 12/10/2023.  Postop doing well, per orthopedics plan for aspirin  twice daily for DVT prophylaxis, along with pain management PT OT rehab placement  Symptomatic orthostasis hypotension 7/24 w/ PT: S/p IV fluid bolus, added compression stocking.  Resolved.  Doing well encourage oral hydration  History of right breast cancer: Status post right mastectomy and chemoradiation therapy.  Followed by oncology outpatient   Osteoporosis: On Fosamax  outpt   Chronic anemia:  Hemoglobin close to baseline although some drop postop.  Monitor as outpatient. Iron was low although ferritin good.  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  DVT prophylaxis: SCDs Start: 12/10/23 1306 Code Status:   Code Status: Full Code Family Communication: plan of care discussed with patient at bedside. Patient status is: Remains hospitalized because of  severity of illness Level of care: Med-Surg   Dispo: The patient is from: home            Anticipated disposition: SNF Objective: Vitals last 24 hrs: Vitals:   12/14/23 0807 12/14/23 1532 12/14/23 2036 12/15/23 0729  BP: (!) 123/49 133/75 (!) 127/56 136/72  Pulse: 90 97 100 96  Resp: 18 17 19 16   Temp: 98.5 F (36.9 C) 99.4 F (37.4 C) 98.6 F (37 C) 98.1 F (36.7 C)  TempSrc: Oral Oral  Oral  SpO2: 97% 98% 100% 97%  Weight:      Height:       Physical Examination: General exam: alert awake, older than stated age HEENT:Oral mucosa moist, Ear/Nose WNL grossly Respiratory system: Bilaterally CLEAR BS, no use of accessory muscle Cardiovascular system: S1 & S2 +. Gastrointestinal system: Abdomen soft, NT,ND,BS+ Nervous System: Alert, awake, following commands. Extremities: LE edema neg, warm extremities, right hip with dressing in place Skin: No rashes,warm. MSK: Normal muscle bulk/tone.    Data Reviewed: I have personally reviewed following labs and imaging studies ( see epic result tab) CBC: Recent Labs  Lab 12/10/23 1138 12/11/23 0634 12/12/23 0912 12/13/23 0515  WBC 6.7 12.4* 9.8 9.7  NEUTROABS 3.4  --   --   --   HGB 11.6* 8.9* 8.3* 8.6*  HCT 36.0 27.6* 25.2* 25.8*  MCV 89.3 89.0 87.5 88.7  PLT 238 276 221 192   CMP: Recent Labs  Lab 12/10/23 1138 12/11/23 0634 12/12/23 0912 12/13/23 0515  NA 140 136 135 137  K 3.8 4.2 3.9 3.8  CL 106 101 102 103  CO2 25 23 24 25   GLUCOSE 118* 145* 118* 126*  BUN 10 9 10 9   CREATININE 0.67 0.82 0.76 0.65  CALCIUM 9.2 8.4* 8.4* 8.6*  GFR: Estimated Creatinine Clearance: 61.3 mL/min (by C-G formula based on SCr of 0.65 mg/dL). Recent Labs  Lab 12/10/23 1138  AST 33  ALT 35  ALKPHOS 56  BILITOT 0.7  PROT 6.3*  ALBUMIN 3.7   No results for input(s): LIPASE, AMYLASE in the last 168 hours. No results for input(s): AMMONIA in the last 168 hours. Coagulation Profile:  Recent Labs  Lab 12/10/23 1115  INR  1.0   Unresulted Labs (From admission, onward)    None      Antimicrobials/Microbiology: Anti-infectives (From admission, onward)    Start     Dose/Rate Route Frequency Ordered Stop   12/10/23 2000  ceFAZolin  (ANCEF ) IVPB 2g/100 mL premix        2 g 200 mL/hr over 30 Minutes Intravenous Every 6 hours 12/10/23 1845 12/11/23 1634   12/10/23 1300  ceFAZolin  (ANCEF ) IVPB 2g/100 mL premix        2 g 200 mL/hr over 30 Minutes Intravenous To Surgery 12/10/23 1257 12/11/23 1635      No results found for: SDES, SPECREQUEST, CULT, REPTSTATUS  Procedures: Procedure(s) (LRB): FIXATION, FRACTURE, INTERTROCHANTERIC, WITH INTRAMEDULLARY ROD (Right) Medications reviewed:  Scheduled Meds:  acetaminophen   1,000 mg Oral Q8H   aspirin   81 mg Oral BID   feeding supplement  237 mL Oral BID BM   loratadine   10 mg Oral Daily   polyethylene glycol  17 g Oral Daily   senna  1 tablet Oral BID  Continuous Infusions:  [name] LAMY, MD [facility] 12/15/2023, 10:32 AM

## 2023-12-15 NOTE — Progress Notes (Signed)
 Physical Therapy Treatment Patient Details Name: Marie Day MRN: [id number] DOB: 1946-02-09 Today's Date: 12/15/2023   History of Present Illness Pt is a 78 y.o. F presenting to [facility] on 12/10/23 following a fall sustaining R hip fx. Pt is s/p ORIF on 12/10/23. PMH is significant for R breast CA, and osteoporosis.    PT Comments  Pt up in chair on arrival, eager for mobility and demonstrating good progress towards acute goals. Pt able to progress gait this session for x2 bouts with RW for support and grossly CGA for safety with chair follow provided for safety as pt with mild c/o dizziness with distance. Pt benefiting from cues for LE sequencing and technique as pt with tendency to over stride on R. Noted decreased hip/knee flexion throughout gait, pt able to perform seated exercises to increased ROM and hip/knee strength. Pt continues to require increased assist to boost to stand from various height surfaces. Patient will benefit from continued inpatient follow up therapy, <3 hours/day, will continue to follow acutely.    If plan is discharge home, recommend the following: A little help with walking and/or transfers;A little help with bathing/dressing/bathroom;Assistance with cooking/housework;Assist for transportation;Help with stairs or ramp for entrance   Can travel by private vehicle     No  Equipment Recommendations  Rolling walker (2 wheels);BSC/3in1;Wheelchair (measurements PT);Wheelchair cushion (measurements PT)    Recommendations for Other Services OT consult     Precautions / Restrictions Precautions Precautions: Fall;Other (comment) Recall of Precautions/Restrictions: Intact Precaution/Restrictions Comments: orthostatic - Watch BP! Restrictions Weight Bearing Restrictions Per Provider Order: Yes RLE Weight Bearing Per Provider Order: Weight bearing as tolerated     Mobility  Bed Mobility Overal bed mobility: Needs Assistance             General bed mobility  comments: pt up in chair on arrival and at end of sesison    Transfers Overall transfer level: Needs assistance Equipment used: Rolling walker (2 wheels) Transfers: Sit to/from Stand, Bed to chair/wheelchair/BSC Sit to Stand: Mod assist, Max assist           General transfer comment: pt standing from chair with max A to boost, mod A to rise from low commode with UE use on rail    Ambulation/Gait Ambulation/Gait assistance: Contact guard assist Gait Distance (Feet): 15 Feet (+ 25') Assistive device: Rolling walker (2 wheels) Gait Pattern/deviations: Step-to pattern, Decreased step length - right, Decreased stance time - right, Decreased weight shift to right, Knee flexed in stance - right, Antalgic Gait velocity: decr     General Gait Details: pt ambulating with sstep-to pattern with cues for LE sequencing, cues for shorter initial stride on R as pt overreaching RLE and withdifficulty maintaining WBing to bring LLE up to R. decreased hip and knee flexion noted on R, chair follow for safety   Stairs             Wheelchair Mobility     Tilt Bed    Modified Rankin (Stroke Patients Only)       Balance Overall balance assessment: Needs assistance Sitting-balance support: No upper extremity supported, Feet supported, Single extremity supported, Bilateral upper extremity supported Sitting balance-Leahy Scale: Fair Sitting balance - Comments: seated EOB   Standing balance support: Bilateral upper extremity supported, Reliant on assistive device for balance Standing balance-Leahy Scale: Poor Standing balance comment: reliant on external support  Communication Communication Communication: No apparent difficulties  Cognition Arousal: Alert Behavior During Therapy: WFL for tasks assessed/performed   PT - Cognitive impairments: No apparent impairments                         Following commands: Intact      Cueing  Cueing Techniques: Verbal cues  Exercises General Exercises - Lower Extremity Long Arc Quad: AROM, Right, 10 reps, Seated (with 2 second hold at end range) Hip Flexion/Marching: AROM, Right, 10 reps, Seated    General Comments General comments (skin integrity, edema, etc.): pt with mild c/o dizziness at end of gait      Pertinent Vitals/Pain Pain Assessment Pain Assessment: Faces Faces Pain Scale: Hurts little more Pain Location: R hip Pain Descriptors / Indicators: Discomfort, Grimacing, Sore Pain Intervention(s): Monitored during session, Limited activity within patient's tolerance    Home Living                          Prior Function            PT Goals (current goals can now be found in the care plan section) Acute Rehab PT Goals Patient Stated Goal: to go home PT Goal Formulation: With patient Time For Goal Achievement: 12/25/23 Progress towards PT goals: Progressing toward goals    Frequency    Min 3X/week      PT Plan      Co-evaluation              AM-PAC PT 6 Clicks Mobility   Outcome Measure  Help needed turning from your back to your side while in a flat bed without using bedrails?: A Little Help needed moving from lying on your back to sitting on the side of a flat bed without using bedrails?: A Little Help needed moving to and from a bed to a chair (including a wheelchair)?: A Little Help needed standing up from a chair using your arms (e.g., wheelchair or bedside chair)?: A Lot Help needed to walk in hospital room?: A Lot (<40') Help needed climbing 3-5 steps with a railing? : Total 6 Click Score: 14    End of Session Equipment Utilized During Treatment: Other (comment);Gait belt (ted hose) Activity Tolerance: Patient tolerated treatment well Patient left: with call bell/phone within reach;in chair;with chair alarm set Nurse Communication: Mobility status PT Visit Diagnosis: Muscle weakness (generalized) (M62.81);History of  falling (Z91.81);Pain Pain - Right/Left: Right Pain - part of body: Hip     Time: 8661-8642 PT Time Calculation (min) (ACUTE ONLY): 19 min  Charges:    $Gait Training: 8-22 mins PT General Charges $$ ACUTE PT VISIT: 1 Visit                     Arabelle Bollig R. PTA Acute Rehabilitation Services Office: [phone]   Therisa CHRISTELLA Boor 12/15/2023, 2:06 PM

## 2023-12-15 NOTE — Plan of Care (Signed)

## 2023-12-15 NOTE — Plan of Care (Signed)

## 2023-12-16 DIAGNOSIS — S72331A Displaced oblique fracture of shaft of right femur, initial encounter for closed fracture: Secondary | ICD-10-CM | POA: Diagnosis not present

## 2023-12-16 MED ORDER — GERHARDT'S BUTT CREAM
TOPICAL_CREAM | Freq: Two times a day (BID) | CUTANEOUS | Status: DC
  Filled 2023-12-16 (×2): qty 60

## 2023-12-16 NOTE — Progress Notes (Signed)
 PROGRESS NOTE Marie Day  FMW:[id number] DOB: Jan 19, 1946 DOA: 12/10/2023 PCP: Kennyth Worth [name], MD  Brief Narrative/Hospital Course: 78 y.o. female with a PMH significant for breast cancer s/p mastectomy and chemoradiation, and osteoporosis on Fosamax  admitted with acute intertrochanteric fracture of the right femoral neck following a mechanical fall. She underwent right intertrochanteric fracture open reduction on and internal fixation with cephalomedullary rod on 12/10/2023.  Postop seen by PT OT pain managed continued wound care.patient was having some issues with muscle spasms but this resolved status post baclofen  administration. Awaiting for skilled nursing facility placement.  Subjective: Patient seen and examined this morning Resting comfortably Slightly sweaty this morning with back pain understanding Overnight patient afebrile blood pressures will continue 117-1 20s Still waiting for insurance approval and auth  Assessment and plan:  Right intertrochanteric femoral neck fracture: S/p ORIF 12/10/2023.  Postop doing well, per orthopedics plan for aspirin  twice daily for DVT prophylaxis, along with pain management PT OT rehab placement  Symptomatic orthostasis hypotension 7/24 w/ PT: S/p IV fluid bolus, added compression stocking.  Resolved.  Doing well encourage oral hydration  History of right breast cancer: Status post right mastectomy and chemoradiation therapy.  Followed by oncology outpatient   Osteoporosis: On Fosamax  outpt   Chronic anemia:  Hemoglobin close to baseline although some drop postop.  Monitor as outpatient. Iron was low although ferritin good.  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  DVT prophylaxis: SCDs Start: 12/10/23 1306 Code Status:   Code Status: Full Code Family Communication: plan of care  discussed with patient at bedside. Patient status is: Remains hospitalized because of severity of illness Level of care: Med-Surg   Dispo: The patient is from: home            Anticipated disposition: SNF 171.  She is medically stable Objective: Vitals last 24 hrs: Vitals:   12/15/23 1459 12/15/23 2015 12/16/23 0541 12/16/23 0743  BP: (!) 122/53 (!) 133/57 (!) 127/54 (!) 117/46  Pulse: 91 81 79 88  Resp: 16 18  16   Temp: 98.2 F (36.8 C) 98.1 F (36.7 C) 98.7 F (37.1 C) 98.2 F (36.8 C)  TempSrc: Oral Oral Oral   SpO2: 98% 97% 97% 98%  Weight:      Height:       Physical Examination: General exam: alert awake, older than stated age HEENT:Oral mucosa moist, Ear/Nose WNL grossly Respiratory system: b/l clear BS, no use of accessory muscle Cardiovascular system: S1 & S2 +. Gastrointestinal system: Abdomen soft, NT,ND,BS+ Nervous System: Alert, awake, following commands. Extremities: LE edema neg, warm extremities, RT HIP surgical site dressing c/d/I Skin: No rashes,warm. MSK: Normal muscle bulk/tone.    Data Reviewed: I have personally reviewed following labs and imaging studies ( see epic result tab) CBC: Recent Labs  Lab 12/10/23 1138 12/11/23 0634 12/12/23 0912 12/13/23 0515  WBC 6.7 12.4* 9.8 9.7  NEUTROABS 3.4  --   --   --   HGB 11.6* 8.9* 8.3* 8.6*  HCT 36.0 27.6* 25.2* 25.8*  MCV 89.3 89.0 87.5 88.7  PLT 238 276 221 192   CMP: Recent Labs  Lab 12/10/23 1138 12/11/23 0634 12/12/23 0912 12/13/23 0515  NA 140 136 135 137  K 3.8 4.2 3.9 3.8  CL 106 101 102 103  CO2 25 23 24 25   GLUCOSE 118* 145* 118* 126*  BUN 10 9 10 9   CREATININE 0.67 0.82 0.76 0.65  CALCIUM 9.2 8.4* 8.4* 8.6*   GFR: Estimated Creatinine Clearance: 61.3 mL/min (by C-G formula based on SCr of 0.65 mg/dL). Recent Labs  Lab 12/10/23 1138  AST 33  ALT 35  ALKPHOS 56  BILITOT 0.7  PROT 6.3*  ALBUMIN 3.7   No results for input(s): LIPASE, AMYLASE in the last 168 hours. No  results for input(s): AMMONIA in the last 168 hours. Coagulation Profile:  Recent Labs  Lab 12/10/23 1115  INR 1.0   Unresulted Labs (From admission, onward)    None      Antimicrobials/Microbiology: Anti-infectives (From admission, onward)    Start     Dose/Rate Route Frequency Ordered Stop   12/10/23 2000  ceFAZolin  (ANCEF ) IVPB 2g/100 mL premix        2 g 200 mL/hr over 30 Minutes Intravenous Every 6 hours 12/10/23 1845 12/11/23 1634   12/10/23 1300  ceFAZolin  (ANCEF ) IVPB 2g/100 mL premix        2 g 200 mL/hr over 30 Minutes Intravenous To Surgery 12/10/23 1257 12/11/23 1635      No results found for: SDES, SPECREQUEST, CULT, REPTSTATUS  Procedures: Procedure(s) (LRB): FIXATION, FRACTURE, INTERTROCHANTERIC, WITH INTRAMEDULLARY ROD (Right) Medications reviewed:  Scheduled Meds:  acetaminophen   1,000 mg Oral Q8H   aspirin   81 mg Oral BID   feeding supplement  237 mL Oral BID BM   Gerhardt's butt cream   Topical BID   loratadine   10 mg Oral Daily   polyethylene glycol  17 g Oral Daily   senna  1 tablet Oral BID  Continuous Infusions:  [name] LAMY, MD [facility] 12/16/2023, 10:59 AM

## 2023-12-16 NOTE — Progress Notes (Signed)
 Orthopedic Surgery Progress Note   Assessment: Patient is a 78 y.o. female with right intertrochanteric femur fracture status post CMN   Plan: -Operative plans: complete -Diet: regular -DVT ppx: aspirin  81mg  BID -Weight bearing status: as tolerated -PT evaluate and treat -Pain control -Dispo: SNF  ___________________________________________________________________________  Subjective: No acute events overnight. Has been getting up to the chair regularly but has had difficulty mobilizing more than short distances. Pain slowly getting better.    Physical Exam:  General: no acute distress, appears stated age Neurologic: alert, answering questions appropriately, following commands Respiratory: unlabored breathing on room air, symmetric chest rise Psychiatric: appropriate affect, normal cadence to speech  MSK:   -Right lower extremity  Dressings over proximal incision with slight amount of dry blood (unchanged from prior exam) otherwise dressings c/d/i EHL/TA/GSC intact Plantarflexes and dorsiflexes toes Sensation intact to light touch in sural, saphenous, tibial, deep peroneal, and superficial peroneal nerve distributions Foot warm and well perfused   Patient name: Marie Day Patient MRN: [id number] Date: 12/16/23

## 2023-12-17 DIAGNOSIS — S72331A Displaced oblique fracture of shaft of right femur, initial encounter for closed fracture: Secondary | ICD-10-CM | POA: Diagnosis not present

## 2023-12-17 MED ORDER — ACETAMINOPHEN 325 MG PO TABS: 650.0000 mg | ORAL_TABLET | Freq: Four times a day (QID) | ORAL | Status: DC | PRN

## 2023-12-17 MED ORDER — ACETAMINOPHEN 325 MG PO TABS
650.0000 mg | ORAL_TABLET | Freq: Three times a day (TID) | ORAL | Status: AC
  Administered 2023-12-17 – 2023-12-18 (×4): 650 mg via ORAL
  Filled 2023-12-17 (×4): qty 2

## 2023-12-17 NOTE — Plan of Care (Signed)
  Problem: Education: Goal: Knowledge of General Education information will improve Description: Including pain rating scale, medication(s)/side effects and non-pharmacologic comfort measures Outcome: Progressing   Problem: Health Behavior/Discharge Planning: Goal: Ability to manage health-related needs will improve Outcome: Progressing   Problem: Clinical Measurements: Goal: Respiratory complications will improve Outcome: Progressing   Problem: Activity: Goal: Risk for activity intolerance will decrease Outcome: Progressing   Problem: Nutrition: Goal: Adequate nutrition will be maintained Outcome: Progressing   Problem: Coping: Goal: Level of anxiety will decrease Outcome: Progressing   Problem: Elimination: Goal: Will not experience complications related to bowel motility Outcome: Progressing Goal: Will not experience complications related to urinary retention Outcome: Progressing   Problem: Pain Managment: Goal: General experience of comfort will improve and/or be controlled Outcome: Progressing   Problem: Safety: Goal: Ability to remain free from injury will improve Outcome: Progressing   Problem: Skin Integrity: Goal: Risk for impaired skin integrity will decrease Outcome: Progressing

## 2023-12-17 NOTE — Plan of Care (Signed)

## 2023-12-17 NOTE — Progress Notes (Signed)
 PROGRESS NOTE EUPHA LOBB  FMW:[id number] DOB: 29-Jan-1946 DOA: 12/10/2023 PCP: Kennyth Worth [name], MD  Brief Narrative/Hospital Course: 78 y.o. female with a PMH significant for breast cancer s/p mastectomy and chemoradiation, and osteoporosis on Fosamax  admitted with acute intertrochanteric fracture of the right femoral neck following a mechanical fall. She underwent right intertrochanteric fracture open reduction on and internal fixation with cephalomedullary rod on 12/10/2023.  Postop seen by PT OT pain managed continued wound care.patient was having some issues with muscle spasms but this resolved status post baclofen  administration. Awaiting for skilled nursing facility placement.  Subjective: Patient seen and examined this morning Having some bruising around the right hip site on the back Reddish rash skin changes on back. Overnight patient is afebrile BP stable  Still waiting for insurance approval and auth  Assessment and plan:  Right intertrochanteric femoral neck fracture: S/p ORIF 12/10/2023.  Postop doing well, per orthopedics plan for aspirin  twice daily for DVT prophylaxis, Intermittently needing oxycodone  and baclofen  PRN, On Tylenol  1 g every 8 hours will change to 650 mg tif Pending SNF  Symptomatic orthostasis hypotension 7/24 w/ PT: S/p IV fluid bolus, added compression stocking.  Resolved.  Doing well encourage oral hydration  History of right breast cancer: Status post right mastectomy and chemoradiation therapy.  Followed by oncology outpatient   Osteoporosis: On Fosamax  outpt   Chronic anemia:  Hemoglobin close to baseline although some drop postop.  Monitor as outpatient. Iron was low although ferritin good.  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  DVT prophylaxis: SCDs Start: 12/10/23 1306 Code Status:    Code Status: Full Code Family Communication: plan of care discussed with patient at bedside. Patient status is: Remains hospitalized because of severity of illness Level of care: Med-Surg   Dispo: The patient is from: home            Anticipated disposition: SNF once available. she is medically stable Objective: Vitals last 24 hrs: Vitals:   12/16/23 1403 12/16/23 1942 12/17/23 0439 12/17/23 0749  BP: (!) 148/74 (!) 118/51 (!) 114/54 (!) 116/51  Pulse: 83 87 82 82  Resp: 16 17 17 18   Temp: 98 F (36.7 C) 98.4 F (36.9 C) 98 F (36.7 C) 98.8 F (37.1 C)  TempSrc:      SpO2: 100% 97% 96% 100%  Weight:      Height:       Physical Examination: General exam: alert awake, older than stated age HEENT:Oral mucosa moist, Ear/Nose WNL grossly Respiratory system: b/l clear BS, no use of accessory muscle Cardiovascular system: S1 & S2 +. Gastrointestinal system: Abdomen soft, NT,ND,BS+ Nervous System: Alert, awake, following commands. Extremities: LE edema neg, warm extremities,Rt hip surgical site dressing c/d/I, bruises + around the right hip and rt back Skin: No rashes,warm. MSK: Normal muscle bulk/tone.    Data Reviewed: I have personally reviewed following labs and imaging studies ( see epic result tab) CBC: Recent Labs  Lab 12/10/23 1138 12/11/23 0634 12/12/23 0912 12/13/23 0515  WBC 6.7 12.4* 9.8 9.7  NEUTROABS 3.4  --   --   --   HGB 11.6* 8.9* 8.3* 8.6*  HCT 36.0 27.6* 25.2* 25.8*  MCV 89.3 89.0 87.5 88.7  PLT 238 276 221 192   CMP: Recent Labs  Lab 12/10/23 1138 12/11/23 0634 12/12/23 0912 12/13/23 0515  NA 140 136 135  137  K 3.8 4.2 3.9 3.8  CL 106 101 102 103  CO2 25 23 24 25   GLUCOSE 118* 145* 118* 126*  BUN 10 9 10 9   CREATININE 0.67 0.82 0.76 0.65  CALCIUM 9.2 8.4* 8.4* 8.6*   GFR: Estimated Creatinine Clearance: 61.3 mL/min (by C-G formula based on SCr of 0.65 mg/dL). Recent Labs  Lab 12/10/23 1138  AST 33  ALT 35  ALKPHOS 56  BILITOT 0.7   PROT 6.3*  ALBUMIN 3.7   No results for input(s): LIPASE, AMYLASE in the last 168 hours. No results for input(s): AMMONIA in the last 168 hours. Coagulation Profile:  Recent Labs  Lab 12/10/23 1115  INR 1.0   Unresulted Labs (From admission, onward)    None      Antimicrobials/Microbiology: Anti-infectives (From admission, onward)    Start     Dose/Rate Route Frequency Ordered Stop   12/10/23 2000  ceFAZolin  (ANCEF ) IVPB 2g/100 mL premix        2 g 200 mL/hr over 30 Minutes Intravenous Every 6 hours 12/10/23 1845 12/11/23 1634   12/10/23 1300  ceFAZolin  (ANCEF ) IVPB 2g/100 mL premix        2 g 200 mL/hr over 30 Minutes Intravenous To Surgery 12/10/23 1257 12/11/23 1635      No results found for: SDES, SPECREQUEST, CULT, REPTSTATUS  Procedures: Procedure(s) (LRB): FIXATION, FRACTURE, INTERTROCHANTERIC, WITH INTRAMEDULLARY ROD (Right) Medications reviewed:  Scheduled Meds:  acetaminophen   650 mg Oral TID   aspirin   81 mg Oral BID   feeding supplement  237 mL Oral BID BM   Gerhardt's butt cream   Topical BID   loratadine   10 mg Oral Daily   polyethylene glycol  17 g Oral Daily   senna  1 tablet Oral BID  Continuous Infusions:  [name] LAMY, MD [facility] 12/17/2023, 10:00 AM

## 2023-12-18 DIAGNOSIS — S72331A Displaced oblique fracture of shaft of right femur, initial encounter for closed fracture: Secondary | ICD-10-CM | POA: Diagnosis not present

## 2023-12-18 NOTE — Progress Notes (Signed)
 Physical Therapy Treatment Patient Details Name: Marie Day MRN: [id number] DOB: July 19, 1945 Today's Date: 12/18/2023   History of Present Illness Pt is a 78 y.o. F presenting to [facility] on 12/10/23 following a fall sustaining R hip fx. Pt is s/p ORIF on 12/10/23. PMH is significant for R breast CA, and osteoporosis.    PT Comments  Pt pleasant and motivated and making steady progress towards her physical therapy goals. Pt requiring min assist for transfers and ambulating limited hallway distances with a walker and step to pattern (w/c follow utilized). Also worked on Museum/gallery curator with R railing to simulate home set up. Patient will benefit from continued inpatient follow up therapy, <3 hours/day in order to address deficits, maximize functional mobility and decrease caregiver burden.    If plan is discharge home, recommend the following: A little help with walking and/or transfers;A little help with bathing/dressing/bathroom;Assistance with cooking/housework;Assist for transportation;Help with stairs or ramp for entrance   Can travel by private vehicle     Yes  Equipment Recommendations  Rolling walker (2 wheels);BSC/3in1;Wheelchair (measurements PT);Wheelchair cushion (measurements PT)    Recommendations for Other Services       Precautions / Restrictions Precautions Precautions: Fall Recall of Precautions/Restrictions: Intact Restrictions Weight Bearing Restrictions Per Provider Order: Yes RLE Weight Bearing Per Provider Order: Weight bearing as tolerated     Mobility  Bed Mobility Overal bed mobility: Modified Independent                  Transfers Overall transfer level: Needs assistance Equipment used: Rolling walker (2 wheels) Transfers: Sit to/from Stand Sit to Stand: Min assist           General transfer comment: Verbal cues for scooting forward, hand placement, minA to rise    Ambulation/Gait Ambulation/Gait assistance: Contact guard assist Gait  Distance (Feet): 15 Feet (15+40) Assistive device: Rolling walker (2 wheels) Gait Pattern/deviations: Step-to pattern, Decreased stance time - right, Decreased weight shift to right, Knee flexed in stance - right, Antalgic, Decreased step length - left       General Gait Details: Verbal cues for R heel strike, decreased R step length, pt self cueing for upright posture. W/c follow utilized. CGA for safety   Stairs Stairs: Yes Stairs assistance: Min assist Stair Management: One rail Right Number of Stairs: 3 General stair comments: Verbal cues for sequencing/technique   Wheelchair Mobility     Tilt Bed    Modified Rankin (Stroke Patients Only)       Balance Overall balance assessment: Needs assistance Sitting-balance support: Feet supported Sitting balance-Leahy Scale: Good     Standing balance support: Bilateral upper extremity supported Standing balance-Leahy Scale: Poor                              Communication Communication Communication: No apparent difficulties  Cognition Arousal: Alert Behavior During Therapy: WFL for tasks assessed/performed   PT - Cognitive impairments: No apparent impairments                         Following commands: Intact      Cueing Cueing Techniques: Verbal cues  Exercises      General Comments        Pertinent Vitals/Pain Pain Assessment Pain Assessment: Faces Faces Pain Scale: Hurts even more Pain Location: R hip Pain Descriptors / Indicators: Discomfort, Grimacing, Sore Pain Intervention(s): Limited activity within patient's tolerance, Monitored  during session, Ice applied    Home Living                          Prior Function            PT Goals (current goals can now be found in the care plan section) Acute Rehab PT Goals Patient Stated Goal: to go home Potential to Achieve Goals: Good Progress towards PT goals: Progressing toward goals    Frequency    Min  3X/week      PT Plan      Co-evaluation              AM-PAC PT 6 Clicks Mobility   Outcome Measure  Help needed turning from your back to your side while in a flat bed without using bedrails?: A Little Help needed moving from lying on your back to sitting on the side of a flat bed without using bedrails?: A Little Help needed moving to and from a bed to a chair (including a wheelchair)?: A Little Help needed standing up from a chair using your arms (e.g., wheelchair or bedside chair)?: A Little Help needed to walk in hospital room?: A Little Help needed climbing 3-5 steps with a railing? : A Little 6 Click Score: 18    End of Session Equipment Utilized During Treatment: Gait belt Activity Tolerance: Patient tolerated treatment well Patient left: in chair;with call bell/phone within reach;with chair alarm set Nurse Communication: Mobility status PT Visit Diagnosis: Muscle weakness (generalized) (M62.81);History of falling (Z91.81);Pain Pain - Right/Left: Right Pain - part of body: Hip     Time: 1030-1105 PT Time Calculation (min) (ACUTE ONLY): 35 min  Charges:    $Gait Training: 8-22 mins $Therapeutic Activity: 8-22 mins PT General Charges $$ ACUTE PT VISIT: 1 Visit                     Aleck Daring, PT, DPT Acute Rehabilitation Services Office [phone]    Alayne ONEIDA Daring 12/18/2023, 1:05 PM

## 2023-12-18 NOTE — Progress Notes (Signed)
 PROGRESS NOTE Marie Day  FMW:[id number] DOB: 05-21-1946 DOA: 12/10/2023 PCP: Kennyth Worth [name], MD  Brief Narrative/Hospital Course: 78 y.o. female with a PMH significant for breast cancer s/p mastectomy and chemoradiation, and osteoporosis on Fosamax  admitted with acute intertrochanteric fracture of the right femoral neck following a mechanical fall. She underwent right intertrochanteric fracture open reduction on and internal fixation with cephalomedullary rod on 12/10/2023.  Postop seen by PT OT pain managed continued wound care.patient was having some issues with muscle spasms but this resolved status post baclofen  administration. Awaiting for skilled nursing facility placement.  Subjective: Seen and examined Having breakfast Uneventful night Overnight patient remains afebrile BP stable Does not need pain meds PRN along with baclofen   Assessment and plan:  Right intertrochanteric femoral neck fracture: S/p ORIF 12/10/2023.  Postop doing well, On aspirin  bid for DVT prophylaxis. On Penumalli with opiates/lack send, cont Tylenol  650 mg tid and wean off awaintg SNF  Symptomatic orthostasis hypotension 7/24 w/ PT session: S/p IV fluid bolus, added compression stocking. BP stable  History of right breast cancer: Status post right mastectomy and chemoradiation therapy.  Followed by oncology outpatient   Osteoporosis: On Fosamax  as outpt-resume on d/c.   Chronic anemia:  Hemoglobin close to baseline although some drop postop.  Monitor as outpatient. Iron was low although ferritin good.  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  DVT prophylaxis: SCDs Start: 12/10/23 1306 Code Status:   Code Status: Full Code Family Communication: plan of care discussed with patient at bedside. Patient status is: Remains hospitalized because of  severity of illness Level of care: Med-Surg   Dispo: The patient is from: home            Anticipated disposition: SNF once available. she is medically stable Objective: Vitals last 24 hrs: Vitals:   12/17/23 0749 12/17/23 1655 12/17/23 2043 12/18/23 0422  BP: (!) 116/51 (!) 120/49 (!) 116/44 (!) 130/57  Pulse: 82 87 84 77  Resp: 18 18 16 20   Temp: 98.8 F (37.1 C) 99.6 F (37.6 C) 98.5 F (36.9 C) 97.8 F (36.6 C)  TempSrc:  Oral Oral Oral  SpO2: 100% 99% 97% 98%  Weight:      Height:       Physical Examination: General exam: alert awake, oriented, pleasant HEENT:Oral mucosa moist, Ear/Nose WNL grossly Respiratory system: Bilaterally clear BS,no use of accessory muscle Cardiovascular system: S1 & S2 +, No JVD. Gastrointestinal system: Abdomen soft,NT,ND, BS+ Nervous System: Alert, awake, moving all extremities,and following commands. Extremities: Right hip incision site dressing  c/d/I, was present on the back thigh area Skin: No rashes,no icterus. MSK: Normal muscle bulk,tone, power    Data Reviewed: I have personally reviewed following labs and imaging studies ( see epic result tab) CBC: Recent Labs  Lab 12/12/23 0912 12/13/23 0515  WBC 9.8 9.7  HGB 8.3* 8.6*  HCT 25.2* 25.8*  MCV 87.5 88.7  PLT 221 192   CMP: Recent Labs  Lab 12/12/23 0912 12/13/23 0515  NA 135 137  K 3.9 3.8  CL 102 103  CO2 24 25  GLUCOSE 118* 126*  BUN 10 9  CREATININE 0.76 0.65  CALCIUM 8.4* 8.6*   GFR: Estimated Creatinine Clearance: 61.3 mL/min (by C-G formula based on SCr of 0.65 mg/dL). No results for input(s): AST, ALT, ALKPHOS, BILITOT, PROT, ALBUMIN in the last 168  hours.  No results for input(s): LIPASE, AMYLASE in the last 168 hours. No results for input(s): AMMONIA in the last 168 hours. Coagulation Profile:  No results for input(s): INR, PROTIME in the last 168 hours.  Unresulted Labs (From admission, onward)     Start     Ordered   12/19/23  0500  Basic metabolic panel with GFR  Tomorrow morning,   R       Question:  Specimen collection method  Answer:  Lab=Lab collect   12/18/23 0918   12/19/23 0500  CBC  Tomorrow morning,   R       Question:  Specimen collection method  Answer:  Lab=Lab collect   12/18/23 0918           Antimicrobials/Microbiology: Anti-infectives (From admission, onward)    Start     Dose/Rate Route Frequency Ordered Stop   12/10/23 2000  ceFAZolin  (ANCEF ) IVPB 2g/100 mL premix        2 g 200 mL/hr over 30 Minutes Intravenous Every 6 hours 12/10/23 1845 12/11/23 1634   12/10/23 1300  ceFAZolin  (ANCEF ) IVPB 2g/100 mL premix        2 g 200 mL/hr over 30 Minutes Intravenous To Surgery 12/10/23 1257 12/11/23 1635      No results found for: SDES, SPECREQUEST, CULT, REPTSTATUS  Procedures: Procedure(s) (LRB): FIXATION, FRACTURE, INTERTROCHANTERIC, WITH INTRAMEDULLARY ROD (Right) Medications reviewed:  Scheduled Meds:  acetaminophen   650 mg Oral TID   aspirin   81 mg Oral BID   feeding supplement  237 mL Oral BID BM   Gerhardt's butt cream   Topical BID   loratadine   10 mg Oral Daily   polyethylene glycol  17 g Oral Daily   senna  1 tablet Oral BID  Continuous Infusions:  [name] LAMY, MD [facility] 12/18/2023, 10:21 AM

## 2023-12-18 NOTE — Plan of Care (Signed)
   Problem: Education: Goal: Knowledge of General Education information will improve Description Including pain rating scale, medication(s)/side effects and non-pharmacologic comfort measures Outcome: Progressing   Problem: Health Behavior/Discharge Planning: Goal: Ability to manage health-related needs will improve Outcome: Progressing

## 2023-12-19 DIAGNOSIS — S72331A Displaced oblique fracture of shaft of right femur, initial encounter for closed fracture: Secondary | ICD-10-CM | POA: Diagnosis not present

## 2023-12-19 LAB — CBC
HCT: 27.7 % — ABNORMAL LOW (ref 36.0–46.0)
Hemoglobin: 8.9 g/dL — ABNORMAL LOW (ref 12.0–15.0)
MCH: 29.1 pg (ref 26.0–34.0)
MCHC: 32.1 g/dL (ref 30.0–36.0)
MCV: 90.5 fL (ref 80.0–100.0)
Platelets: 461 K/uL — ABNORMAL HIGH (ref 150–400)
RBC: 3.06 MIL/uL — ABNORMAL LOW (ref 3.87–5.11)
RDW: 14.9 % (ref 11.5–15.5)
WBC: 12 K/uL — ABNORMAL HIGH (ref 4.0–10.5)
nRBC: 0.2 % (ref 0.0–0.2)

## 2023-12-19 LAB — BASIC METABOLIC PANEL WITH GFR
Anion gap: 10 (ref 5–15)
BUN: 14 mg/dL (ref 8–23)
CO2: 24 mmol/L (ref 22–32)
Calcium: 9.2 mg/dL (ref 8.9–10.3)
Chloride: 105 mmol/L (ref 98–111)
Creatinine, Ser: 0.73 mg/dL (ref 0.44–1.00)
GFR, Estimated: >60 mL/min (ref >60)
Glucose, Bld: 108 mg/dL — ABNORMAL HIGH (ref 70–99)
Potassium: 3.9 mmol/L (ref 3.5–5.1)
Sodium: 139 mmol/L (ref 135–145)

## 2023-12-19 NOTE — Progress Notes (Signed)
 Occupational Therapy Treatment Patient Details Name: Marie Day MRN: [id number] DOB: 1946-04-09 Today's Date: 12/19/2023   History of present illness Pt is a 78 y.o. female admitted 12/10/23 after fall sustaining R intertrochanteric femur fx. S/p R hip CMN on 7/20. PMH includes R breast CA, osteoporosis.   OT comments  Pt making good progress with functional goals and reports that she would now like to return home and that she did well in earlier PT session )noted from PT note today. OT is ok with this as pt required less assist with ADLs and ADL mobility with BP stable. Updating recommendation to home with [facility] therapy service when medically ready. SW and MD aware. Pt and husband educated on LB A/E, 3 in 1 or toilet riser and shower chair for home use. OT will continue to follow acutely to maximize level of function and safety      If plan is discharge home, recommend the following:  A lot of help with bathing/dressing/bathroom;Assistance with cooking/housework;Assist for transportation;Help with stairs or ramp for entrance;A little help with walking and/or transfers   Equipment Recommendations  BSC/3in1;Tub/shower seat    Recommendations for Other Services      Precautions / Restrictions Precautions Precautions: Fall Recall of Precautions/Restrictions: Intact Restrictions Weight Bearing Restrictions Per Provider Order: Yes RLE Weight Bearing Per Provider Order: Weight bearing as tolerated       Mobility Bed Mobility Overal bed mobility: Independent Bed Mobility: Supine to Sit, Sit to Supine     Supine to sit: Min assist, HOB elevated, Used rails Sit to supine: Min assist   General bed mobility comments: min A for LE mgt on and off bed    Transfers Overall transfer level: Needs assistance Equipment used: Rolling walker (2 wheels) Transfers: Sit to/from Stand Sit to Stand: Min assist, Contact guard assist Stand pivot transfers: Contact guard assist   Step pivot  transfers: Contact guard assist     General transfer comment: min A initial STS form EOB to RW     Balance Overall balance assessment: Needs assistance Sitting-balance support: Feet supported Sitting balance-Leahy Scale: Good     Standing balance support: Single extremity supported, Bilateral upper extremity supported, No upper extremity supported Standing balance-Leahy Scale: Fair                             ADL either performed or assessed with clinical judgement   ADL Overall ADL's : Needs assistance/impaired     Grooming: Wash/dry hands;Wash/dry face;Oral care;Contact guard assist;Standing                   Toilet Transfer: Contact guard assist;Regular Toilet;BSC/3in1;Grab bars;Ambulation;Rolling walker (2 wheels);Cueing for safety   Toileting- Clothing Manipulation and Hygiene: Minimal assistance;Sit to/from stand       Functional mobility during ADLs: Minimal assistance;Contact guard assist;Rolling walker (2 wheels);Cueing for safety General ADL Comments: pt and husband educated on 3 in 1, toilet riser and shower chair for home use.    Extremity/Trunk Assessment Upper Extremity Assessment Upper Extremity Assessment: Generalized weakness;Right hand dominant   Lower Extremity Assessment Lower Extremity Assessment: Defer to PT evaluation   Cervical / Trunk Assessment Cervical / Trunk Assessment: Kyphotic    Vision Ability to See in Adequate Light: 0 Adequate Patient Visual Report: No change from baseline     Perception     Praxis     Communication Communication Communication: No apparent difficulties   Cognition Arousal: Alert  Behavior During Therapy: [facility] for tasks assessed/performed                                 Following commands: Intact        Cueing   Cueing Techniques: Verbal cues  Exercises      Shoulder Instructions       General Comments pt denies dizziness but sweating consistently during session, BP  156/67 and pt denies dizziness throughout. increased time discussing d/c plans with pt and husband regarding HHPT vs SNF; they ultimately want to have pt return home and family will be available for necessary assist. increased time educ re: activity recommendations, assist needs, home and activity modifications, potential DME needs    Pertinent Vitals/ Pain       Pain Assessment Pain Assessment: 0-10 Pain Score: 3  Pain Location: R hip Pain Descriptors / Indicators: Aching, Sore Pain Intervention(s): Monitored during session, Premedicated before session, Repositioned  Home Living                                          Prior Functioning/Environment              Frequency           Progress Toward Goals  OT Goals(current goals can now be found in the care plan section)  Progress towards OT goals: Progressing toward goals  Acute Rehab OT Goals Patient Stated Goal: go home  Plan      Co-evaluation                 AM-PAC OT 6 Clicks Daily Activity     Outcome Measure   Help from another person eating meals?: None Help from another person taking care of personal grooming?: A Little Help from another person toileting, which includes using toliet, bedpan, or urinal?: A Little Help from another person bathing (including washing, rinsing, drying)?: A Lot Help from another person to put on and taking off regular upper body clothing?: A Little Help from another person to put on and taking off regular lower body clothing?: A Lot 6 Click Score: 17    End of Session Equipment Utilized During Treatment: Gait belt;Rolling walker (2 wheels);Other (comment) (3 in 1)  OT Visit Diagnosis: Unsteadiness on feet (R26.81);Repeated falls (R29.6);Muscle weakness (generalized) (M62.81);Pain Pain - Right/Left: Right Pain - part of body: Hip   Activity Tolerance Patient tolerated treatment well   Patient Left with family/visitor present;in bed;with call  bell/phone within reach   Nurse Communication Mobility status        Time: 8578-8543 OT Time Calculation (min): 35 min  Charges: OT General Charges $OT Visit: 1 Visit OT Treatments $Self Care/Home Management : 8-22 mins $Therapeutic Activity: 8-22 mins    Jacques Karna Loose 12/19/2023, 3:17 PM

## 2023-12-19 NOTE — Progress Notes (Signed)
 Physical Therapy Treatment Patient Details Name: Marie Day MRN: [id number] DOB: November 11, 1945 Today's Date: 12/19/2023   History of Present Illness Pt is a 78 y.o. female admitted 12/10/23 after fall sustaining R intertrochanteric femur fx. S/p R hip CMN on 7/20. PMH includes R breast CA, osteoporosis.   PT Comments  Pt progressing with mobility. Today's session focused on transfer and gait training with RW, pt moving well with intermittent min guard for balance. Pt and husband would prefer pt d/c home with HHPT; MD and CSW notified. Increased time educ on strategies for safe return home. Will continue to follow acutely to address established goals.     If plan is discharge home, recommend the following: A little help with bathing/dressing/bathroom;Assistance with cooking/housework;Assist for transportation;Help with stairs or ramp for entrance   Can travel by private vehicle     Yes  Equipment Recommendations  Rolling walker (2 wheels)    Recommendations for Other Services       Precautions / Restrictions Precautions Precautions: Fall Recall of Precautions/Restrictions: Intact Restrictions Weight Bearing Restrictions Per Provider Order: Yes RLE Weight Bearing Per Provider Order: Weight bearing as tolerated     Mobility  Bed Mobility Overal bed mobility: Independent Bed Mobility: Supine to Sit, Sit to Supine           General bed mobility comments: indep supine<>sit to R-side bed with bed flat, no bed rails    Transfers Overall transfer level: Needs assistance Equipment used: Rolling walker (2 wheels) Transfers: Sit to/from Stand Sit to Stand: Supervision           General transfer comment: multiple sit<>stands from EOB to RW trialling various hand placement, initial cues to scoot forward with good carryover; 4x sit<>stand from EOB, 1x sit<>stand from low toilet height, supervision for safety    Ambulation/Gait Ambulation/Gait assistance: Contact guard  assist Gait Distance (Feet): 40 Feet (+ 60' + 16') Assistive device: Rolling walker (2 wheels) Gait Pattern/deviations: Step-to pattern, Step-through pattern, Decreased stride length, Trunk flexed, Decreased weight shift to right Gait velocity: Decreased     General Gait Details: slow, antalgic gait with RW and intermittent CGA for safety; 1x seated rest break in hallway secondary to fatigue and pt sweating; bowel urgency while walking therefore additional break to void   Stairs Stairs:  (deferred secondary to fatigue; reviewed and demonstrated options for descending 4 steps to bedroom since rail on R going down; pt demonstrates good carryover from yesterday's stair training including 'down with bad')           Wheelchair Mobility     Tilt Bed    Modified Rankin (Stroke Patients Only)       Balance Overall balance assessment: Needs assistance Sitting-balance support: Feet supported Sitting balance-Leahy Scale: Good     Standing balance support: Single extremity supported, Bilateral upper extremity supported, No upper extremity supported Standing balance-Leahy Scale: Fair Standing balance comment: can stand without UE support at sink to wash hands and face, reliant on RW to take steps                            Communication Communication Communication: No apparent difficulties  Cognition Arousal: Alert Behavior During Therapy: WFL for tasks assessed/performed   PT - Cognitive impairments: No apparent impairments                         Following commands: Intact  Cueing Cueing Techniques: Verbal cues  Exercises      General Comments General comments (skin integrity, edema, etc.): pt denies dizziness but sweating consistently during session, BP 156/67 and pt denies dizziness throughout. increased time discussing d/c plans with pt and husband regarding HHPT vs SNF; they ultimately want to have pt return home and family will be available  for necessary assist. increased time educ re: activity recommendations, assist needs, home and activity modifications, potential DME needs      Pertinent Vitals/Pain Pain Assessment Pain Assessment: Faces Faces Pain Scale: Hurts little more Pain Location: R hip Pain Descriptors / Indicators: Discomfort, Grimacing, Sore Pain Intervention(s): Monitored during session, Limited activity within patient's tolerance    Home Living                          Prior Function            PT Goals (current goals can now be found in the care plan section) Progress towards PT goals: Progressing toward goals    Frequency    Min 3X/week      PT Plan      Co-evaluation              AM-PAC PT 6 Clicks Mobility   Outcome Measure  Help needed turning from your back to your side while in a flat bed without using bedrails?: None Help needed moving from lying on your back to sitting on the side of a flat bed without using bedrails?: None Help needed moving to and from a bed to a chair (including a wheelchair)?: A Little Help needed standing up from a chair using your arms (e.g., wheelchair or bedside chair)?: A Little Help needed to walk in hospital room?: A Little Help needed climbing 3-5 steps with a railing? : A Little 6 Click Score: 20    End of Session Equipment Utilized During Treatment: Gait belt Activity Tolerance: Patient tolerated treatment well Patient left: in bed;with call bell/phone within reach;with family/visitor present;with nursing/sitter in room Nurse Communication: Mobility status PT Visit Diagnosis: Muscle weakness (generalized) (M62.81);History of falling (Z91.81);Pain Pain - Right/Left: Right Pain - part of body: Hip     Time: 0910-0954 PT Time Calculation (min) (ACUTE ONLY): 44 min  Charges:    $Gait Training: 8-22 mins $Therapeutic Activity: 8-22 mins $Self Care/Home Management: 8-22 PT General Charges $$ ACUTE PT VISIT: 1 Visit                      Darice Almas, PT, DPT Acute Rehabilitation Services  Personal: Secure Chat Rehab Office: [phone]  Darice LITTIE Almas 12/19/2023, 11:29 AM

## 2023-12-19 NOTE — Plan of Care (Signed)

## 2023-12-19 NOTE — Progress Notes (Signed)
 PROGRESS NOTE Marie Day  FMW:[id number] DOB: 09-12-1945 DOA: 12/10/2023 PCP: Kennyth Worth [name], MD  Brief Narrative/Hospital Course: 78 y.o. female with a PMH significant for breast cancer s/p mastectomy and chemoradiation, and osteoporosis on Fosamax  admitted with acute intertrochanteric fracture of the right femoral neck following a mechanical fall. She underwent right intertrochanteric fracture open reduction on and internal fixation with cephalomedullary rod on 12/10/2023.  Postop seen by PT OT pain managed continued wound care.patient was having some issues with muscle spasms but this resolved status post baclofen  administration. Awaiting for skilled nursing facility placement.  Subjective: Seen and examined Overall doing work with PT today and eager to go home instead of SNF Overnight vitals stable afebrile labs reviewed this morning 7/29 overall stable chronic anemia hemoglobin 8.9 stable renal function  Waiting for placement still  Assessment and plan:  Right intertrochanteric femoral neck fracture: S/p ORIF 12/10/2023.  Postop doing well, On aspirin  bid for DVT prophylaxis. Cont pain control w/ multimodal approach opiates and opiates. Awaintg SNF> but now mobilizing well, so as to go to home we will plan for discharge tomorrow with home health  Symptomatic orthostasis hypotension 7/24 w/ PT session: S/p IV fluid bolus. On Compression stocking. BP stable  History of right breast cancer: S/P [name] and chemoradiation therapy. Followed by oncology outpatient   Osteoporosis: On Fosamax  as outpt-resume on d/c.   Chronic anemia:  Hemoglobin close to baseline although some drop postop.  Monitor as outpatient. Iron was low although ferritin good.  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  DVT prophylaxis: SCDs Start:  12/10/23 1306 Code Status:   Code Status: Full Code Family Communication: plan of care discussed with patient at bedside. Patient status is: Remains hospitalized because of severity of illness Level of care: Med-Surg   Dispo: The patient is from: home            Anticipated disposition: Improving, plan for discharge home with home health tomorrow if pain is controlled   Objective: Vitals last 24 hrs: Vitals:   12/18/23 1517 12/18/23 2007 12/19/23 0550 12/19/23 0753  BP: (!) 121/58 105/61 (!) 140/66 (!) 120/59  Pulse: 77 87 96 81  Resp: 17 18 18 18   Temp: 98.4 F (36.9 C) 98.5 F (36.9 C) 98.3 F (36.8 C) 98.7 F (37.1 C)  TempSrc: Oral Oral Oral Oral  SpO2: 97% 98% 99% 95%  Weight:      Height:       Physical Examination: General exam: AAO X3 pleasant conversant  HEENT:Oral mucosa moist, Ear/Nose WNL grossly Respiratory system: Bilaterally clear BS,no use of accessory muscle Cardiovascular system: S1 & S2 +, No JVD. Gastrointestinal system: Abdomen soft,NT,ND, BS+ Nervous System: Alert, awake, moving all extremities,and following commands. Extremities: rt hip incision site dressing C/D/I bruise on the thigh  Skin: No rashes,no icterus. MSK: Normal muscle bulk,tone, power    Data Reviewed: I have personally reviewed following labs and imaging studies ( see epic result tab) CBC: Recent Labs  Lab 12/13/23 0515 12/19/23 0604  WBC 9.7 12.0*  HGB 8.6* 8.9*  HCT 25.8* 27.7*  MCV 88.7 90.5  PLT 192 461*   CMP: Recent Labs  Lab 12/13/23 0515 12/19/23 0604  NA 137 139  K 3.8 3.9  CL 103 105  CO2 25 24  GLUCOSE 126* 108*  BUN 9 14  CREATININE 0.65  0.73  CALCIUM 8.6* 9.2   GFR: Estimated Creatinine Clearance: 61.3 mL/min (by C-G formula based on SCr of 0.73 mg/dL). No results for input(s): AST, ALT, ALKPHOS, BILITOT, PROT, ALBUMIN in the last 168 hours.  No results for input(s): LIPASE, AMYLASE in the last 168 hours. No results for input(s):  AMMONIA in the last 168 hours. Coagulation Profile:  No results for input(s): INR, PROTIME in the last 168 hours.  Unresulted Labs (From admission, onward)    None      Antimicrobials/Microbiology: Anti-infectives (From admission, onward)    Start     Dose/Rate Route Frequency Ordered Stop   12/10/23 2000  ceFAZolin  (ANCEF ) IVPB 2g/100 mL premix        2 g 200 mL/hr over 30 Minutes Intravenous Every 6 hours 12/10/23 1845 12/11/23 1634   12/10/23 1300  ceFAZolin  (ANCEF ) IVPB 2g/100 mL premix        2 g 200 mL/hr over 30 Minutes Intravenous To Surgery 12/10/23 1257 12/11/23 1635      No results found for: SDES, SPECREQUEST, CULT, REPTSTATUS  Procedures: Procedure(s) (LRB): FIXATION, FRACTURE, INTERTROCHANTERIC, WITH INTRAMEDULLARY ROD (Right) Medications reviewed:  Scheduled Meds:  aspirin   81 mg Oral BID   feeding supplement  237 mL Oral BID BM   Gerhardt's butt cream   Topical BID   loratadine   10 mg Oral Daily   polyethylene glycol  17 g Oral Daily   senna  1 tablet Oral BID  Continuous Infusions:  [name] LAMY, MD [facility] 12/19/2023, 10:59 AM

## 2023-12-19 NOTE — TOC Progression Note (Signed)
 Transition of Care [facility]) - Progression Note    Patient Details  Name: Marie Day MRN: [id number] Date of Birth: [birth year]  [facility]) CM/SW Contact  Bridget Cordella Simmonds, LCSW Phone Number: 12/19/2023, 10:25 AM  Clinical Narrative:   CSW informed by PT that pt did well today, wants to change plan to DC home with [facility].  CSW spoke with pt and husband in room and they confirmed this.  Will need walker.  RNCM informed.  [facility] informed.     Expected Discharge Plan: Skilled Nursing Facility Barriers to Discharge: Continued Medical Work up, English as a second language teacher, SNF Pending bed offer               Expected Discharge Plan and Services In-house Referral: Clinical Social Work   Post Acute Care Choice: Skilled Nursing Facility Living arrangements for the past 2 months: Single Family Home                                       Social Drivers of Health (SDOH) Interventions SDOH Screenings   Food Insecurity: No Food Insecurity (12/10/2023)  Housing: Low Risk  (12/10/2023)  Transportation Needs: No Transportation Needs (12/10/2023)  Utilities: Not At Risk (12/10/2023)  Alcohol Screen: Low Risk  (10/10/2023)  Depression (PHQ2-9): Low Risk  (10/10/2023)  Financial Resource Strain: Low Risk  (10/10/2023)  Physical Activity: Inactive (10/10/2023)  Social Connections: Unknown (12/10/2023)  Stress: No Stress Concern Present (10/10/2023)  Tobacco Use: Low Risk  (12/10/2023)  Health Literacy: Adequate Health Literacy (10/10/2023)    Readmission Risk Interventions     No data to display

## 2023-12-20 DIAGNOSIS — S72331A Displaced oblique fracture of shaft of right femur, initial encounter for closed fracture: Secondary | ICD-10-CM | POA: Diagnosis not present

## 2023-12-20 MED ORDER — FERROUS SULFATE 325 (65 FE) MG PO TBEC
325.0000 mg | DELAYED_RELEASE_TABLET | Freq: Two times a day (BID) | ORAL | 0 refills | Status: DC
Start: 2023-12-20 — End: 2024-04-04

## 2023-12-20 MED ORDER — TRAZODONE HCL 50 MG PO TABS: 25.0000 mg | ORAL_TABLET | Freq: Every evening | ORAL | Status: DC | PRN

## 2023-12-20 NOTE — Plan of Care (Signed)
   Problem: Education: Goal: Knowledge of General Education information will improve Description Including pain rating scale, medication(s)/side effects and non-pharmacologic comfort measures Outcome: Progressing   Problem: Health Behavior/Discharge Planning: Goal: Ability to manage health-related needs will improve Outcome: Progressing

## 2023-12-20 NOTE — Progress Notes (Signed)
 Mobility Specialist Progress Note:    12/20/23 1206  Mobility  Activity Ambulated with assistance (In hallway)  Level of Assistance Contact guard assist, steadying assist  Assistive Device Front wheel walker  Distance Ambulated (ft) 60 ft  RLE Weight Bearing Per Provider Order WBAT  Activity Response Tolerated well  Mobility Referral Yes  Mobility visit 1 Mobility  Mobility Specialist Start Time (ACUTE ONLY) 1146  Mobility Specialist Stop Time (ACUTE ONLY) 1157  Mobility Specialist Time Calculation (min) (ACUTE ONLY) 11 min   Received pt EOB and agreeable to mobility. No physical assistance needed. C/o RLE pain and sweatiness, otherwise tolerated well. Returned to room and left pt in chair. Personal belongings and call light within reach. All needs met.  Lavanda Pollack Mobility Specialist  Please contact via [employer] or  Rehab Office [phone]

## 2023-12-20 NOTE — Progress Notes (Signed)
 PROGRESS NOTE Marie Day  FMW:[id number] DOB: 05/14/1946 DOA: 12/10/2023 PCP: Kennyth Worth [name], MD  Brief Narrative/Hospital Course: 78 y.o. female with a PMH significant for breast cancer s/p mastectomy and chemoradiation, and osteoporosis on Fosamax  admitted with acute intertrochanteric fracture of the right femoral neck following a mechanical fall. She underwent right intertrochanteric fracture open reduction on and internal fixation with cephalomedullary rod on 12/10/2023.  Postop seen by PT OT pain managed continued wound care.patient was having some issues with muscle spasms but this resolved status post baclofen  administration.Awaiting for skilled nursing facility placement. PT OT working with her at this time recommending home health and patient is eager to go home  Subjective: Seen and examined Resting comfortably, mobilizing better Overnight afebrile BP stable labs reviewed from 7/29 and overall unchanged with chronic anemia/ some abla Waiting for placement still She tells me she does not feel ready for discharge to home today  Assessment and plan:  Right intertrochanteric femoral neck fracture: S/p ORIF 12/10/2023.  Postop doing well, On aspirin  bid for DVT prophylaxis. Cont pain control w/ multimodal approach opiates and opiates. Awaintg SNF> but now mobilizing well, so wants to go to home w/ [facility], walker  Symptomatic orthostasis hypotension 7/24 w/ PT session: S/p IV fluid bolus. On Compression stocking. BP stable  History of right breast cancer: S/P [name] and chemoradiation therapy. Followed by oncology outpatient   Osteoporosis: On Fosamax  as outpt-resume on d/c.   Chronic anemia:  close to baseline although some drop postop. Monitor as outpatient. Iron was low although ferritin good. Add po iron.  Hyperlipidemia: Follow-up with PCP  Class I Obesity w/ Body mass index is 30.38 kg/m.: Will benefit with PCP follow-up, weight loss,healthy lifestyle and  outpatient sleep eval if not done.  DVT prophylaxis: SCDs Start: 12/10/23 1306 Code Status:   Code Status: Full Code Family Communication: plan of care discussed with patient at bedside. Patient status is: Remains hospitalized because of severity of illness Level of care: Med-Surg   Dispo: The patient is from: home            Anticipated disposition: home w/ [facility] anticipating tomorrow once mobilizing better and pain is controlled.  She does not feel ready today  Objective: Vitals last 24 hrs: Vitals:   12/19/23 0550 12/19/23 0753 12/20/23 0437 12/20/23 0743  BP: (!) 140/66 (!) 120/59 (!) 119/46 116/61  Pulse: 96 81 83 89  Resp: 18 18 18 17   Temp: 98.3 F (36.8 C) 98.7 F (37.1 C) 98.2 F (36.8 C) 98.1 F (36.7 C)  TempSrc: Oral Oral Oral Oral  SpO2: 99% 95% 97% 97%  Weight:      Height:       Physical Examination: General exam: AAO X3, pleasant conversant  HEENT:Oral mucosa moist, Ear/Nose WNL grossly Respiratory system: Bilaterally clear BS,no use of accessory muscle Cardiovascular system: S1 & S2 +, No JVD. Gastrointestinal system: Abdomen soft,NT,ND, BS+ Nervous System: Alert, awake, moving all extremities,and following commands. Extremities: Right hip surgical site with dressing in place C/D/I, bruise present  Skin: No rashes,no icterus. MSK: Normal muscle bulk,tone, power    Data Reviewed: I have personally reviewed following labs and imaging studies ( see epic result tab) CBC: Recent Labs  Lab 12/19/23 0604  WBC 12.0*  HGB 8.9*  HCT 27.7*  MCV 90.5  PLT 461*   CMP: Recent Labs  Lab 12/19/23 0604  NA 139  K 3.9  CL 105  CO2 24  GLUCOSE 108*  BUN 14  CREATININE 0.73  CALCIUM 9.2   GFR: Estimated Creatinine Clearance: 61.3 mL/min (by C-G formula based on SCr of 0.73 mg/dL). No results for input(s): AST, ALT, ALKPHOS, BILITOT, PROT, ALBUMIN in the last 168 hours.  No results for input(s): LIPASE, AMYLASE in the last 168 hours. No results  for input(s): AMMONIA in the last 168 hours. Coagulation Profile:  No results for input(s): INR, PROTIME in the last 168 hours.  Unresulted Labs (From admission, onward)    None      Antimicrobials/Microbiology: Anti-infectives (From admission, onward)    Start     Dose/Rate Route Frequency Ordered Stop   12/10/23 2000  ceFAZolin  (ANCEF ) IVPB 2g/100 mL premix        2 g 200 mL/hr over 30 Minutes Intravenous Every 6 hours 12/10/23 1845 12/11/23 1634   12/10/23 1300  ceFAZolin  (ANCEF ) IVPB 2g/100 mL premix        2 g 200 mL/hr over 30 Minutes Intravenous To Surgery 12/10/23 1257 12/11/23 1635      No results found for: SDES, SPECREQUEST, CULT, REPTSTATUS  Procedures: Procedure(s) (LRB): FIXATION, FRACTURE, INTERTROCHANTERIC, WITH INTRAMEDULLARY ROD (Right) Medications reviewed:  Scheduled Meds:  aspirin   81 mg Oral BID   feeding supplement  237 mL Oral BID BM   Gerhardt's butt cream   Topical BID   loratadine   10 mg Oral Daily   polyethylene glycol  17 g Oral Daily   senna  1 tablet Oral BID  Continuous Infusions:  [name] LAMY, MD [facility] 12/20/2023, 9:59 AM

## 2023-12-20 NOTE — Care Management Important Message (Signed)
 Important Message  Patient Details  Name: Marie Day MRN: [id number] Date of Birth: 02-02-1946   Important Message Given:  Yes - Medicare IM     Claretta Deed 12/20/2023, 1:22 PM

## 2023-12-21 DIAGNOSIS — S72331A Displaced oblique fracture of shaft of right femur, initial encounter for closed fracture: Secondary | ICD-10-CM | POA: Diagnosis not present

## 2023-12-21 NOTE — TOC Transition Note (Signed)
 Transition of Care [facility]) - Discharge Note   Patient Details  Name: Marie Day MRN: [id number] Date of Birth: [birth year]  Transition of [facility] CM/SW Contact:  Rosalva Jon Bloch, RN Phone Number: 12/21/2023, 1:30 PM   Clinical Narrative:    Patient will DC to: home Anticipated DC date: 12/21/2023 Family notified: yes Transport by: car      - Right intertrochanteric femoral neck fracture:S/p ORIF 12/10/2023 Per MD patient ready for DC today. RN, patient, and patient's husband notified of DC. Pt agreeable to home health services per therapy recommendations. Pt without provider preference. Referral made with [facility] and accepted pending MD order. Referral made with [employer] for RW. Equipment will be delivered to bedside prior to d/c. Post hospital f/u noted on AVS.  Pt without RX med concerns. Husband to provide transportation to home.  RNCM will sign off for now as intervention is no longer needed. Please consult us  again if new needs arise.     Barriers to Discharge: No Barriers Identified   Patient Goals and CMS Choice Patient states their goals for this hospitalization and ongoing recovery are:: SNF   Choice offered to / list presented to : Patient      Discharge Placement                       Discharge Plan and Services Additional resources added to the After Visit Summary for   In-house Referral: Clinical Social Work   Post Acute Care Choice: Skilled Nursing Facility          DME Arranged: [name] rolling DME Agency: [employer] Date DME Agency Contacted: 12/21/23 Time DME Agency Contacted: [phone] Representative spoke with at DME Agency: Darlyn HH Arranged: PT,OT HH Agency: [facility] Date [facility] Agency Contacted: 12/21/23 Time HH Agency Contacted: 1330 Representative spoke with at [facility] Agency: Amy  Social Drivers of Health (SDOH) Interventions SDOH Screenings   Food Insecurity: No Food Insecurity (12/10/2023)  Housing: Low Risk   (12/10/2023)  Transportation Needs: No Transportation Needs (12/10/2023)  Utilities: Not At Risk (12/10/2023)  Alcohol Screen: Low Risk  (10/10/2023)  Depression (PHQ2-9): Low Risk  (10/10/2023)  Financial Resource Strain: Low Risk  (10/10/2023)  Physical Activity: Inactive (10/10/2023)  Social Connections: Unknown (12/10/2023)  Stress: No Stress Concern Present (10/10/2023)  Tobacco Use: Low Risk  (12/10/2023)  Health Literacy: Adequate Health Literacy (10/10/2023)     Readmission Risk Interventions     No data to display

## 2023-12-21 NOTE — Discharge Summary (Signed)
 Physician Discharge Summary  Marie Day FMW:[id number] DOB: 1945/11/22 DOA: 12/10/2023  PCP: Kennyth Worth [name], MD  Admit date: 12/10/2023 Discharge date: 12/21/2023 Recommendations for Outpatient Follow-up:  Follow up with PCP in 1 weeks-call for appointment Please obtain BMP/CBC in one week Follow-up with orthopedics next week as scheduled  Discharge Dispo: home w/ [facility] Discharge Condition: Stable Code Status:   Code Status: Full Code Diet recommendation:  Diet Order             Diet regular Fluid consistency: Thin  Diet effective now                    Brief/Interim Summary: 78 y.o. female with a PMH significant for breast cancer s/p mastectomy and chemoradiation, and osteoporosis on Fosamax  admitted with acute intertrochanteric fracture of the right femoral neck following a mechanical fall. She underwent right intertrochanteric fracture open reduction on and internal fixation with cephalomedullary rod on 12/10/2023.  Postop seen by PT OT pain managed continued wound care.patient was having some issues with muscle spasms but this resolved status post baclofen  administration.Awaiting for skilled nursing facility placement. PT OT working with her at this time recommending home health and patient is eager to go home  Subjective: Alert awake oriented this morning Resting comfortably and eager to go home today  Discharge diagnoses:  Right intertrochanteric femoral neck fracture: S/p ORIF 12/10/2023.  Postop doing well, On aspirin  bid for DVT prophylaxis. Cont pain control w/ multimodal approach opiates and opiates. Awaintg SNF> but now mobilizing well, so wants to go to home w/ [facility], walker  Symptomatic orthostasis hypotension 7/24 w/ PT session: S/p IV fluid bolus. On Compression stocking. BP stable  History of right breast cancer: S/P [name] and chemoradiation therapy. Followed by oncology outpatient   Osteoporosis: On Fosamax  as outpt-resume on d/c.   Chronic  anemia:  close to baseline although some drop postop. Monitor as outpatient. Iron was low although ferritin good. Add po iron.  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  DVT prophylaxis: SCDs Start: 12/10/23 1306 Code Status:   Code Status: Full Code Family Communication: plan of care discussed with patient at bedside. Patient status is: Remains hospitalized because of severity of illness Level of care: Med-Surg   Dispo: The patient is from: home            Anticipated disposition: home w/ HH  Objective: Vitals last 24 hrs: Vitals:   12/20/23 1544 12/20/23 2003 12/21/23 0647 12/21/23 0817  BP: (!) 127/54 107/76 (!) 116/58 (!) 153/64  Pulse: 89 100 78 84  Resp: 19 17 17 16   Temp: 98.1 F (36.7 C) 98.9 F (37.2 C) 98.4 F (36.9 C) 98 F (36.7 C)  TempSrc:  Oral Oral   SpO2: 97% 95% 98% 98%  Weight:      Height:       Physical Examination: General exam: AAO X3 HEENT:Oral mucosa moist, Ear/Nose WNL grossly Respiratory system: Bilaterally clear BS,no use of accessory muscle Cardiovascular system: S1 & S2 +, No JVD. Gastrointestinal system: Abdomen soft,NT,ND, BS+ Nervous System: Alert, awake, moving all extremities,and following commands. Extremities: Right hip surgical site with dressing in place C/D/I, bruise present  Skin: No rashes,no icterus. MSK: Normal muscle bulk,tone, power    Procedure(s) (LRB): FIXATION, FRACTURE, INTERTROCHANTERIC, WITH INTRAMEDULLARY ROD (Right)   Consultation: See note.  Discharge  Instructions  Discharge Instructions     Discharge instructions   Complete by: As directed    Please call call MD or return to ER for similar or worsening recurring problem that brought you to hospital or if any fever,nausea/vomiting,abdominal pain, uncontrolled pain, chest pain,  shortness of breath or any  other alarming symptoms.  Please follow-up your doctor as instructed in a week time and call the office for appointment.  Please avoid alcohol, smoking, or any other illicit substance and maintain healthy habits including taking your regular medications as prescribed.  You were cared for by a hospitalist during your hospital stay. If you have any questions about your discharge medications or the care you received while you were in the hospital after you are discharged, you can call the unit and ask to speak with the hospitalist on call if the hospitalist that took care of you is not available.  Once you are discharged, your primary care physician will handle any further medical issues. Please note that NO REFILLS for any discharge medications will be authorized once you are discharged, as it is imperative that you return to your primary care physician (or establish a relationship with a primary care physician if you do not have one) for your aftercare needs so that they can reassess your need for medications and monitor your lab values   Discharge wound care:   Complete by: As directed    Reinforce dressing as needed   Increase activity slowly   Complete by: As directed       Allergies as of 12/21/2023       Reactions   Sulfa Antibiotics Hives, Other (See Comments)   Pt reports foggy brain, Rashy/ hives, Red spots on Chest        Medication List     TAKE these medications    acetaminophen  500 MG tablet Commonly known as: TYLENOL  Take 2 tablets (1,000 mg total) by mouth every 8 (eight) hours for 14 days.   alendronate  70 MG tablet Commonly known as: FOSAMAX  TAKE 1 TABLET (70 MG TOTAL) BY MOUTH ONCE A WEEK. TAKE WITH A FULL GLASS OF WATER  ON AN EMPTY STOMACH. What changed: additional instructions   aspirin  81 MG chewable tablet Chew 1 tablet (81 mg total) by mouth 2 (two) times daily.   baclofen  10 MG tablet Commonly known as: LIORESAL  Take 1 tablet (10 mg total) by mouth  3 (three) times daily as needed for muscle spasms.   ferrous sulfate  325 (65 FE) MG EC tablet Take 1 tablet (325 mg total) by mouth 2 (two) times daily.   OSTEO BI-FLEX REGULAR STRENGTH PO Take 1 Dose by mouth in the morning and at bedtime.   oxyCODONE  5 MG immediate release tablet Commonly known as: Oxy IR/ROXICODONE  Take 1-2 tablets (5-10 mg total) by mouth every 4 (four) hours as needed for severe pain (pain score 7-10) or moderate pain (pain score 4-6).   polyethylene glycol 17 g packet Commonly known as: MIRALAX  / GLYCOLAX  Take 17 g by mouth daily for 14 days.   senna 8.6 MG Tabs tablet Commonly known as: SENOKOT Take 1 tablet (8.6 mg total) by mouth 2 (two) times daily for 14 days.   traZODone  50 MG tablet Commonly known as: DESYREL  TAKE 0.5-1 TABLETS BY MOUTH AT BEDTIME AS NEEDED FOR SLEEP. What changed: See the new instructions.   UNABLE TO FIND Take 1 Dose by mouth as needed (sleep). CBD Gummies   UNABLE TO FIND Take 2 capsules  by mouth in the morning. Fish oil & Tumeric   VITAMIN K2-VITAMIN D3 PO Take 1 tablet by mouth in the morning.               Durable Medical Equipment  (From admission, onward)           Start     Ordered   12/19/23 1211  For home use only DME Walker rolling  Once       Question Answer Comment  Walker: With 5 Inch Wheels   Patient needs a walker to treat with the following condition Gait instability      12/19/23 1211              Discharge Care Instructions  (From admission, onward)           Start     Ordered   12/14/23 0000  Discharge wound care:       Comments: Reinforce dressing as needed   12/14/23 0710            Contact information for follow-up providers     Kennyth Worth [name], MD Follow up in 1 week(s).   Specialty: Family Medicine Contact information: [address] [place] [address] [phone]         Georgina Ozell LABOR, MD Follow up in 1 week(s).   Specialty: Orthopedic  Surgery Contact information: [address] [place] [address] [phone]              Contact information for after-discharge care     Destination     [facility] .   Service: Skilled Nursing Contact information: [address] [place]  [phone]                    Allergies  Allergen Reactions   Sulfa Antibiotics Hives and Other (See Comments)    Pt reports foggy brain, Rashy/ hives, Red spots on Chest    The results of significant diagnostics from this hospitalization (including imaging, microbiology, ancillary and laboratory) are listed below for reference.    Microbiology: No results found for this or any previous visit (from the past 240 hours).  Procedures/Studies: DG HIP UNILAT WITH PELVIS 2-3 VIEWS RIGHT Result Date: 12/10/2023 CLINICAL DATA:  Postop. EXAM: DG HIP (WITH OR WITHOUT PELVIS) 2-3V RIGHT COMPARISON:  Preoperative imaging FINDINGS: Femoral intramedullary nail with trans trochanteric and distal locking screw fixation traversing proximal femur fracture. Persistent displacement of lesser trochanteric fragment. Recent postsurgical change includes air and edema in the soft tissues with overlying skin staples in place. IMPRESSION: ORIF of proximal femur fracture. Electronically Signed   By: Andrea Gasman M.D.   On: 12/10/2023 18:43   DG HIP UNILAT WITH PELVIS 2-3 VIEWS RIGHT Result Date: 12/10/2023 CLINICAL DATA:  Elective surgery EXAM: DG HIP (WITH OR WITHOUT PELVIS) 2-3V RIGHT COMPARISON:  Preoperative imaging FINDINGS: Seven fluoroscopic spot views of the right hip submitted from the operating room. Femoral intramedullary nail with trans trochanteric and distal locking screw fixation traversing proximal femur fracture. Fluoroscopy time 1 minutes 57 seconds. Dose 34.89 mGy. IMPRESSION: Intraoperative fluoroscopy during proximal femur fracture fixation. Electronically Signed   By: Andrea Gasman M.D.   On:  12/10/2023 18:42   DG C-Arm 1-60 Min-No Report Result Date: 12/10/2023 Fluoroscopy was utilized by the requesting physician.  No radiographic interpretation.   DG C-Arm 1-60 Min-No Report Result Date: 12/10/2023 Fluoroscopy was utilized by the requesting physician.  No radiographic interpretation.   DG Pelvis  1-2 Views Result Date: 12/10/2023 CLINICAL DATA:  Fall, right hip fracture EXAM: PELVIS - 1-2 VIEW COMPARISON:  None Available. FINDINGS: There is an acute intratrochanteric fracture of the right hip with superior displacement, override and external rotation of the distal femoral shaftl and avulsion of the lesser trochanter with 12 mm distraction of the fracture fragment. Femoral head is still seated within the right acetabulum and joint space is preserved. Pelvis and visualized left hip are intact. Soft tissues unremarkable. IMPRESSION: 1. Acute intratrochanteric fracture of the right hip. Electronically Signed   By: Dorethia Molt M.D.   On: 12/10/2023 12:02   DG Chest Portable 1 View Result Date: 12/10/2023 CLINICAL DATA:  Fall, right hip fracture. EXAM: PORTABLE CHEST 1 VIEW COMPARISON:  None Available. FINDINGS: Lungs are well expanded, symmetric, and clear. No pneumothorax or pleural effusion. Cardiac size within normal limits. Pulmonary vascularity is normal. Surgical clips noted within the right axilla. Osseous structures are age-appropriate. No acute bone abnormality. IMPRESSION: No active disease. Electronically Signed   By: Dorethia Molt M.D.   On: 12/10/2023 12:01   DG Femur Min 2 Views Right Result Date: 12/10/2023 CLINICAL DATA:  Fall, right hip pain EXAM: RIGHT FEMUR 2 VIEWS COMPARISON:  None Available. FINDINGS: There is an acute intratrochanteric fracture of the right femoral neck with avulsion of the lesser trochanter by approximately 15 mm. Mild distraction of the major fracture fragments by approximately 5 mm. Major fracture fragments are normally aligned. Femoral head is  still seated within the right acetabulum and right hip joint space is preserved. Distal femoral shaft is intact. Visualized right hemipelvis is intact. IMPRESSION: 1. Acute intratrochanteric fracture of the right femoral neck. Electronically Signed   By: Dorethia Molt M.D.   On: 12/10/2023 12:00    Labs: BNP (last 3 results) No results for input(s): BNP in the last 8760 hours. Basic Metabolic Panel: Recent Labs  Lab 12/19/23 0604  NA 139  K 3.9  CL 105  CO2 24  GLUCOSE 108*  BUN 14  CREATININE 0.73  CALCIUM 9.2   Liver Function Tests: No results for input(s): AST, ALT, ALKPHOS, BILITOT, PROT, ALBUMIN in the last 168 hours. No results for input(s): LIPASE, AMYLASE in the last 168 hours. No results for input(s): AMMONIA in the last 168 hours. CBC: Recent Labs  Lab 12/19/23 0604  WBC 12.0*  HGB 8.9*  HCT 27.7*  MCV 90.5  PLT 461*   Recent Labs  Lab 12/19/23 0604  WBC 12.0*   Microbiology No results found for this or any previous visit (from the past 240 hours).  Time coordinating discharge: 35  minutes  SIGNED: [name] LAMY, MD  [facility] 12/21/2023, 11:11 AM  If 7PM-7AM, please contact night-coverage [email or web address]

## 2023-12-21 NOTE — Progress Notes (Addendum)
 DISCHARGE NOTE HOME Marie Day to be discharged Home per MD order. Discussed prescriptions and follow up appointments with the patient. Prescriptions given to patient; medication list explained in detail. Patient verbalized understanding.  Patient has paper script for pain medicaitons.  Skin clean, dry and intact without evidence of skin break down, no evidence of skin tears noted. IV catheter discontinued intact. Site without signs and symptoms of complications. Dressing and pressure applied. Pt denies pain at the site currently. No complaints noted.  See LDA for right leg incision. Patient free of lines, drains, and wounds.   An After Visit Summary (AVS) was printed and given to the patient. CM needs to see patient before can leave  Peyton SHAUNNA Pepper, RN

## 2023-12-21 NOTE — Progress Notes (Signed)
 Patient escorted out by volunteer to private automobile for discharge

## 2023-12-21 NOTE — Progress Notes (Signed)
 Physical Therapy Treatment Patient Details Name: Marie Day MRN: [id number] DOB: [birth year] Today's Date: 12/21/2023   History of Present Illness Pt is a 78 y.o. female admitted 12/10/23 after fall sustaining R intertrochanteric femur fx. S/p R hip CMN on 7/20. PMH includes R breast CA, osteoporosis.    PT Comments  Pt resting in bed on arrival, pleasant and agreeable to session with continued progress towards acute goals. Pt demonstrating bed mobility, transfers and gait with RW for support with grossly CGA fading to supervision. Pt continues to be diaphoretic with activity, however declining dizziness or lightheadedness. Pt was educated on continued walker use to maximize functional independence, safety, and decrease risk for falls as well as appropriate activity progression, ice, HEP and compliance, safe car entry/exit and importance of continued mobility with pt verbalizing understating. Anticipate safe discharge, with assist level outlined below, once medically cleared, will continue to follow acutely.     If plan is discharge home, recommend the following: A little help with bathing/dressing/bathroom;Assistance with cooking/housework;Assist for transportation;Help with stairs or ramp for entrance   Can travel by private vehicle     Yes  Equipment Recommendations  Rolling walker (2 wheels)    Recommendations for Other Services       Precautions / Restrictions Precautions Precautions: Fall Recall of Precautions/Restrictions: Intact Precaution/Restrictions Comments: orthostatic - Watch BP! Restrictions Weight Bearing Restrictions Per Provider Order: Yes RLE Weight Bearing Per Provider Order: Weight bearing as tolerated     Mobility  Bed Mobility Overal bed mobility: Needs Assistance       Supine to sit: Supervision, HOB elevated, Used rails     General bed mobility comments: supervision for safety    Transfers Overall transfer level: Needs assistance Equipment used:  Rolling walker (2 wheels) Transfers: Sit to/from Stand Sit to Stand: Contact guard assist           General transfer comment: CGA for safety    Ambulation/Gait Ambulation/Gait assistance: Contact guard assist Gait Distance (Feet): 85 Feet Assistive device: Rolling walker (2 wheels) Gait Pattern/deviations: Step-to pattern, Step-through pattern, Decreased stride length, Trunk flexed, Decreased weight shift to right Gait velocity: Decreased     General Gait Details: slow, antalgic gait with RW and intermittent CGA for safety;   Stairs         General stair comments: verbally reviewed stair sequencing with pt able to recall, standing marching x5 with LLE to simulate single LE weight bearing on R to step up to steps   Wheelchair Mobility     Tilt Bed    Modified Rankin (Stroke Patients Only)       Balance Overall balance assessment: Needs assistance Sitting-balance support: Feet supported Sitting balance-Leahy Scale: Good Sitting balance - Comments: seated EOB   Standing balance support: Single extremity supported, Bilateral upper extremity supported, No upper extremity supported Standing balance-Leahy Scale: Fair Standing balance comment: can stand without UE support at sink to wash hands and face, reliant on RW to take steps                            Communication Communication Communication: No apparent difficulties  Cognition Arousal: Alert Behavior During Therapy: WFL for tasks assessed/performed   PT - Cognitive impairments: No apparent impairments                         Following commands: Intact      Cueing Cueing Techniques:  Verbal cues  Exercises Other Exercises Other Exercises: standing marching LLE x5 to increase weight bearing tolerance on R for carryover to stair negotiation    General Comments General comments (skin integrity, edema, etc.): pt denies dizziness but sweating consistently during session,       Pertinent Vitals/Pain Pain Assessment Pain Assessment: Faces Faces Pain Scale: Hurts a little bit Pain Location: R hip Pain Descriptors / Indicators: Aching, Sore Pain Intervention(s): Monitored during session, Limited activity within patient's tolerance    Home Living                          Prior Function            PT Goals (current goals can now be found in the care plan section) Acute Rehab PT Goals Patient Stated Goal: to go home PT Goal Formulation: With patient Time For Goal Achievement: 12/25/23 Progress towards PT goals: Progressing toward goals    Frequency    Min 3X/week      PT Plan      Co-evaluation              AM-PAC PT 6 Clicks Mobility   Outcome Measure  Help needed turning from your back to your side while in a flat bed without using bedrails?: None Help needed moving from lying on your back to sitting on the side of a flat bed without using bedrails?: None Help needed moving to and from a bed to a chair (including a wheelchair)?: A Little Help needed standing up from a chair using your arms (e.g., wheelchair or bedside chair)?: A Little Help needed to walk in hospital room?: A Little Help needed climbing 3-5 steps with a railing? : A Little 6 Click Score: 20    End of Session Equipment Utilized During Treatment: Gait belt Activity Tolerance: Patient tolerated treatment well Patient left: with call bell/phone within reach;in chair Nurse Communication: Mobility status PT Visit Diagnosis: Muscle weakness (generalized) (M62.81);History of falling (Z91.81);Pain Pain - Right/Left: Right Pain - part of body: Hip     Time: 9041-8973 PT Time Calculation (min) (ACUTE ONLY): 28 min  Charges:    $Gait Training: 23-37 mins PT General Charges $$ ACUTE PT VISIT: 1 Visit                     Marie Day R. PTA Acute Rehabilitation Services Office: [phone]   Marie Day Boor 12/21/2023, 12:38 PM

## 2023-12-22 ENCOUNTER — Telehealth: Payer: Self-pay

## 2023-12-22 NOTE — Transitions of Care (Post Inpatient/ED Visit) (Signed)
 12/22/2023  Name: Marie Day MRN: [id number] DOB: 1946/02/08  Today's TOC FU Call Status: Today's TOC FU Call Status:: Successful TOC FU Call Completed TOC FU Call Complete Date: 12/22/23 Patient's Name and Date of Birth confirmed.  Transition Care Management Follow-up Telephone Call Date of Discharge: 12/21/23 Discharge Facility: [facility]) Type of Discharge: Inpatient Admission Primary Inpatient Discharge Diagnosis:: fractured femur How have you been since you were released from the hospital?: Better Any questions or concerns?: No  Items Reviewed: Did you receive and understand the discharge instructions provided?: Yes Medications obtained,verified, and reconciled?: Yes (Medications Reviewed) Any new allergies since your discharge?: No Dietary orders reviewed?: Yes Do you have support at home?: Yes People in Home [RPT]: spouse  Medications Reviewed Today: Medications Reviewed Today     Reviewed by Emmitt Pan, LPN (Licensed Practical Nurse) on 12/22/23 at [phone]  Med List Status: <None>   Medication Order Taking? Sig Documenting Provider Last Dose Status Informant  acetaminophen  (TYLENOL ) 500 MG tablet [id number] Yes Take 2 tablets (1,000 mg total) by mouth every 8 (eight) hours for 14 days. Georgina Ozell LABOR, MD  Active   alendronate  (FOSAMAX ) 70 MG tablet [id number] Yes TAKE 1 TABLET (70 MG TOTAL) BY MOUTH ONCE A WEEK. TAKE WITH A FULL GLASS OF WATER  ON AN EMPTY STOMACH.  Patient taking differently: Take 70 mg by mouth once a week. Take with a full glass of water  on an empty stomach. On Sunday mornings.   Gudena, Vinay, MD  Active Self, Pharmacy Records  aspirin  81 MG chewable tablet [id number] Yes Chew 1 tablet (81 mg total) by mouth 2 (two) times daily. Georgina Ozell LABOR, MD  Active   baclofen  (LIORESAL ) 10 MG tablet [id number] Yes Take 1 tablet (10 mg total) by mouth 3 (three) times daily as needed for muscle spasms. Christobal Guadalajara, MD  Active   ferrous sulfate  325 (65  FE) MG EC tablet [id number] Yes Take 1 tablet (325 mg total) by mouth 2 (two) times daily. Christobal Guadalajara, MD  Active   Glucosamine-Chondroitin (OSTEO BI-FLEX REGULAR STRENGTH PO) [id number] Yes Take 1 Dose by mouth in the morning and at bedtime. [provider]  Active Self, Pharmacy Records           Med Note EFRAIM, ALFREIDA LITTIE Repress Dec 10, 2023  9:13 PM) Patient stated she has been inconsistent with taking this medication.  oxyCODONE  (OXY IR/ROXICODONE ) 5 MG immediate release tablet [id number] Yes Take 1-2 tablets (5-10 mg total) by mouth every 4 (four) hours as needed for severe pain (pain score 7-10) or moderate pain (pain score 4-6). Georgina Ozell LABOR, MD  Active   polyethylene glycol (MIRALAX  / GLYCOLAX ) 17 g packet [id number] Yes Take 17 g by mouth daily for 14 days. Georgina Ozell LABOR, MD  Active   senna (SENOKOT) 8.6 MG TABS tablet [id number] Yes Take 1 tablet (8.6 mg total) by mouth 2 (two) times daily for 14 days. Georgina Ozell LABOR, MD  Active   traZODone  (DESYREL ) 50 MG tablet [id number] Yes TAKE 0.5-1 TABLETS BY MOUTH AT BEDTIME AS NEEDED FOR SLEEP.  Patient taking differently: Take 25-50 mg by mouth at bedtime as needed for sleep.   Kennyth Worth [name], MD  Active Self, Pharmacy Records  UNABLE TO FIND [id number] Yes Take 1 Dose by mouth as needed (sleep). CBD Gummies [provider]  Active Self, Pharmacy Records           Med Note (HAGOPIAN, TYELISHA L  Sun Dec 10, 2023  9:14 PM) Patient stated she has been inconsistent with taking them.  UNABLE TO FIND [id number] Yes Take 2 capsules by mouth in the morning. Fish oil & Tumeric [provider]  Active Self, Pharmacy Records  Vitamin D-Vitamin K (VITAMIN K2-VITAMIN D3 PO) [id number] Yes Take 1 tablet by mouth in the morning. [provider]  Active Self, Pharmacy Records            Home Care and Equipment/Supplies: Were Home Health Services Ordered?: Yes Name of Home Health Agency:: unknown Has Agency set  up a time to come to your home?: No Any new equipment or medical supplies ordered?: Yes Name of Medical supply agency?: unknown Were you able to get the equipment/medical supplies?: Yes Do you have any questions related to the use of the equipment/supplies?: No  Functional Questionnaire: Do you need assistance with bathing/showering or dressing?: Yes Do you need assistance with meal preparation?: Yes Do you need assistance with eating?: No Do you have difficulty maintaining continence: No Do you need assistance with getting out of bed/getting out of a chair/moving?: Yes Do you have difficulty managing or taking your medications?: No  Follow up appointments reviewed: PCP Follow-up appointment confirmed?: Yes Date of PCP follow-up appointment?: 01/01/24 Follow-up Provider: [facility] Follow-up appointment confirmed?: Yes Date of Specialist follow-up appointment?: 12/27/23 Follow-Up Specialty Provider:: ortho Do you need transportation to your follow-up appointment?: No Do you understand care options if your condition(s) worsen?: Yes-patient verbalized understanding    SIGNATURE Julian Lemmings, LPN [facility] Nurse Health Advisor Direct Dial [phone]

## 2023-12-25 ENCOUNTER — Telehealth: Payer: Self-pay | Admitting: *Deleted

## 2023-12-25 NOTE — Telephone Encounter (Signed)
 Copied from CRM [id number]. Topic: Clinical - Order For Equipment >> Dec 25, 2023  9:24 AM Donna BRAVO wrote: Reason for CRM: Ronal with [facility] supply order for bedside commode and shower chair  Ronal will be re faxing order   Form received, patient has an OV on 01/01/2024 [facility]

## 2023-12-26 ENCOUNTER — Inpatient Hospital Stay
Payer: No Typology Code available for payment source | Attending: Hematology and Oncology | Admitting: Hematology and Oncology

## 2023-12-26 ENCOUNTER — Telehealth: Payer: Self-pay | Admitting: *Deleted

## 2023-12-26 VITALS — BP 119/67 | HR 79 | Temp 98.1°F | Resp 17 | Ht 64.0 in | Wt 178.1 lb

## 2023-12-26 DIAGNOSIS — M2550 Pain in unspecified joint: Secondary | ICD-10-CM | POA: Diagnosis not present

## 2023-12-26 DIAGNOSIS — Z171 Estrogen receptor negative status [ER-]: Secondary | ICD-10-CM | POA: Insufficient documentation

## 2023-12-26 DIAGNOSIS — R5383 Other fatigue: Secondary | ICD-10-CM | POA: Insufficient documentation

## 2023-12-26 DIAGNOSIS — Z9221 Personal history of antineoplastic chemotherapy: Secondary | ICD-10-CM | POA: Diagnosis not present

## 2023-12-26 DIAGNOSIS — Z7982 Long term (current) use of aspirin: Secondary | ICD-10-CM | POA: Diagnosis not present

## 2023-12-26 DIAGNOSIS — Z923 Personal history of irradiation: Secondary | ICD-10-CM | POA: Insufficient documentation

## 2023-12-26 DIAGNOSIS — C50411 Malignant neoplasm of upper-outer quadrant of right female breast: Secondary | ICD-10-CM | POA: Diagnosis present

## 2023-12-26 DIAGNOSIS — Z9011 Acquired absence of right breast and nipple: Secondary | ICD-10-CM | POA: Insufficient documentation

## 2023-12-26 NOTE — Telephone Encounter (Signed)
 Copied from CRM [id number]. Topic: Clinical - Order For Equipment >> Dec 25, 2023  9:24 AM Donna BRAVO wrote: Reason for CRM: Ronal with [facility] supply order for bedside commode and shower chair  Ronal will be re faxing order

## 2023-12-26 NOTE — Telephone Encounter (Signed)
 Spoke with Ronal at [phone] Notified form received will fax form and visit note after appt visit

## 2023-12-26 NOTE — Progress Notes (Signed)
 Patient Care Team: Kennyth Worth [name], MD as PCP - General (Family Medicine) Odean Potts, MD as Consulting Physician (Hematology and Oncology) Dewey Rush, MD as Consulting Physician (Radiation Oncology) Vanderbilt Ned, MD as Consulting Physician (General Surgery)  DIAGNOSIS:  Encounter Diagnosis  Name Primary?   Malignant neoplasm of upper-outer quadrant of right breast in female, estrogen receptor negative (HCC) Yes    SUMMARY OF ONCOLOGIC HISTORY: Oncology History  Malignant neoplasm of upper-outer quadrant of right breast in female, estrogen receptor negative (HCC)  11/17/2020 Initial Diagnosis   Work-up performed at [facility]: Palpable right breast mass.  Mammogram and ultrasound 11/10/2020: Spiculated 4.9 cm mass UOQ right breast with microcalcifications, biopsy revealed IDC ER/PR negative, HER2 positive 3+ by IHC, axillary lymph node positive (2 lymph nodes were detected)   11/20/2020 Genetic Testing   Negative. The Common Hereditary Gene Panel offered by [facility] includes sequencing and/or deletion duplication testing of the following 47 genes: APC, ATM, AXIN2, BARD1, BMPR1A, BRCA1, BRCA2, BRIP1, CDH1, CDK4, CDKN2A (p14ARF), CDKN2A (p16INK4a), CHEK2, CTNNA1, DICER1, EPCAM (Deletion/duplication testing only), GREM1 (promoter region deletion/duplication testing only), KIT, MEN1, MLH1, MSH2, MSH3, MSH6, MUTYH, NBN, NF1, NHTL1, PALB2, PDGFRA, PMS2, POLD1, POLE, PTEN, RAD50, RAD51C, RAD51D, SDHB, SDHC, SDHD, SMAD4, SMARCA4. STK11, TP53, TSC1, TSC2, and VHL.  The following genes were evaluated for sequence changes only: SDHA and HOXB13 c.251G>A variant only.    12/03/2020 PET scan   Right breast malignancy SUV 4.6, subcentimeter right level 1 and 2 axillary lymph nodes.  No distant metastatic disease.   12/21/2020 Cancer Staging   Staging form: Breast, AJCC 8th Edition - Clinical stage from 12/21/2020: Stage IIB (cT2, cN1, cM0, G3, ER-, PR-, HER2+) - Signed by Odean Potts, MD on  12/21/2020 Stage prefix: Initial diagnosis Histologic grading system: 3 grade system   12/29/2020 - 12/21/2021 Chemotherapy   Patient is on Treatment Plan : BREAST  Docetaxel  + Carboplatin  + Trastuzumab  + Pertuzumab   (TCHP) q21d      06/29/2021 Surgery   Right mastectomy: No residual cancer identified, 0/4 lymph nodes negative, complete pathologic response   08/27/2021 - 10/12/2021 Radiation Therapy   Site Technique Total Dose (Gy) Dose per Fx (Gy) Completed Fx Beam Energies  Chest Wall, Right: CW_R 3D 50.4/50.4 1.8 28/28 6X  Chest Wall, Right: CW_R_SCLV 3D 50.4/50.4 1.8 28/28 6X, 10X  Chest Wall, Right: CW_R_Bst Electron 10/10 2 5/5 6E       CHIEF COMPLIANT: Surveillance of breast cancer  HISTORY OF PRESENT ILLNESS: History of Present Illness Marie Day is a 78 year old female who presents for follow-up after a femur fracture.  She experienced a fall at church, resulting in a femur fracture just below the neck, which required surgical intervention with pin placement. She was hospitalized for twelve days and currently uses a walker for mobility. She experiences significant fatigue and remains mostly bedridden since returning home. She was able to get up the stairs by herself and will be seeing her surgeon soon.  She has a history of breast cancer diagnosed three years ago, with clear mammograms and no current symptoms. There is a family history of cancer, with her mother and both grandmothers having died from pancreatic and bone cancer.     ALLERGIES:  is allergic to sulfa antibiotics.  MEDICATIONS:  Current Outpatient Medications  Medication Sig Dispense Refill   acetaminophen  (TYLENOL ) 500 MG tablet Take 2 tablets (1,000 mg total) by mouth every 8 (eight) hours for 14 days.     alendronate  (  FOSAMAX ) 70 MG tablet TAKE 1 TABLET (70 MG TOTAL) BY MOUTH ONCE A WEEK. TAKE WITH A FULL GLASS OF WATER  ON AN EMPTY STOMACH. 12 tablet 3   aspirin  81 MG chewable tablet Chew 1 tablet (81 mg total)  by mouth 2 (two) times daily.     baclofen  (LIORESAL ) 10 MG tablet Take 1 tablet (10 mg total) by mouth 3 (three) times daily as needed for muscle spasms.     ferrous sulfate  325 (65 FE) MG EC tablet Take 1 tablet (325 mg total) by mouth 2 (two) times daily. 60 tablet 0   Glucosamine-Chondroitin (OSTEO BI-FLEX REGULAR STRENGTH PO) Take 1 Dose by mouth in the morning and at bedtime.     oxyCODONE  (OXY IR/ROXICODONE ) 5 MG immediate release tablet Take 1-2 tablets (5-10 mg total) by mouth every 4 (four) hours as needed for severe pain (pain score 7-10) or moderate pain (pain score 4-6). 30 tablet 0   senna (SENOKOT) 8.6 MG TABS tablet Take 1 tablet (8.6 mg total) by mouth 2 (two) times daily for 14 days.     traZODone  (DESYREL ) 50 MG tablet TAKE 0.5-1 TABLETS BY MOUTH AT BEDTIME AS NEEDED FOR SLEEP. 90 tablet 2   UNABLE TO FIND Take 1 Dose by mouth as needed (sleep). CBD Gummies     UNABLE TO FIND Take 2 capsules by mouth in the morning. Fish oil & Tumeric     Vitamin D-Vitamin K (VITAMIN K2-VITAMIN D3 PO) Take 1 tablet by mouth in the morning.     No current facility-administered medications for this visit.    PHYSICAL EXAMINATION: ECOG PERFORMANCE STATUS: 1 - Symptomatic but completely ambulatory  Vitals:   12/26/23 1145  BP: 119/67  Pulse: 79  Resp: 17  Temp: 98.1 F (36.7 C)  SpO2: 98%   Filed Weights   12/26/23 1145  Weight: 178 lb 1.6 oz (80.8 kg)    Physical Exam No palpable lumps or margins of bilateral breasts or axilla.  Right breast has been reconstructed.  (exam performed in the presence of a chaperone)  LABORATORY DATA:  I have reviewed the data as listed    Latest Ref Rng & Units 12/19/2023    6:04 AM 12/13/2023    5:15 AM 12/12/2023    9:12 AM  CMP  Glucose 70 - 99 mg/dL 891  873  881   BUN 8 - 23 mg/dL 14  9  10    Creatinine 0.44 - 1.00 mg/dL 9.26  9.34  9.23   Sodium 135 - 145 mmol/L 139  137  135   Potassium 3.5 - 5.1 mmol/L 3.9  3.8  3.9   Chloride 98 -  111 mmol/L 105  103  102   CO2 22 - 32 mmol/L 24  25  24    Calcium 8.9 - 10.3 mg/dL 9.2  8.6  8.4     Lab Results  Component Value Date   WBC 12.0 (H) 12/19/2023   HGB 8.9 (L) 12/19/2023   HCT 27.7 (L) 12/19/2023   MCV 90.5 12/19/2023   PLT 461 (H) 12/19/2023   NEUTROABS 3.4 12/10/2023    ASSESSMENT & PLAN:  Malignant neoplasm of upper-outer quadrant of right breast in female, estrogen receptor negative (HCC) 11/17/2020:Work-up performed at [facility]: Palpable right breast mass.  Mammogram and ultrasound 11/10/2020: Spiculated 4.9 cm mass UOQ right breast with microcalcifications, biopsy revealed IDC ER/PR negative, HER2 positive 3+ by IHC, axillary lymph node positive (2 lymph nodes were detected)  Genetics were negative   Treatment Plan based on multidisciplinary tumor board: 1. Neoadjuvant chemotherapy with TCH Perjeta  6 cycles (started 12/29/2020-04/13/2021) followed by Herceptin  Perjeta  maintenance  for 1 year 2. 06/29/2021:Right mastectomy: No residual cancer identified, 0/4 lymph nodes negative, complete pathologic response 3. Followed by adjuvant radiation therapy 08/27/2021-10/12/2021 ------------------------------------------------------------------------------------------------------------------------- Current treatment: Surveillance  Diffuse arthralgias and myalgias: The symptoms have come back since chemotherapy has been discontinued    Breast cancer surveillance:  Breast exam 12/26/2023: Benign  mammograms in the left breast 09/22/2023: Benign breast density category C Bone density 06/07/2022: T-score -2.9: Osteoporosis recommended calcium and vitamin D and bisphosphonate therapy   [facility]ant reveal for MRD testing   Return to clinic in 1 year for follow-up      No orders of the defined types were placed in this encounter.  The patient has a good understanding of the overall plan. she agrees with it. she will call with any problems that may develop before the next  visit here. Total time spent: 30 mins including face to face time and time spent for planning, charting and co-ordination of care   Naomi MARLA Chad, MD 12/26/23

## 2023-12-26 NOTE — Assessment & Plan Note (Signed)
 11/17/2020:Work-up performed at [facility]: Palpable right breast mass.  Mammogram and ultrasound 11/10/2020: Spiculated 4.9 cm mass UOQ right breast with microcalcifications, biopsy revealed IDC ER/PR negative, HER2 positive 3+ by IHC, axillary lymph node positive (2 lymph nodes were detected)   Genetics were negative   Treatment Plan based on multidisciplinary tumor board: 1. Neoadjuvant chemotherapy with [facility] Perjeta  6 cycles (started 12/29/2020-04/13/2021) followed by Herceptin  Perjeta  maintenance  for 1 year 2. 06/29/2021:Right mastectomy: No residual cancer identified, 0/4 lymph nodes negative, complete pathologic response 3. Followed by adjuvant radiation therapy 08/27/2021-10/12/2021 ------------------------------------------------------------------------------------------------------------------------- Current treatment: Surveillance  Diffuse arthralgias and myalgias: The symptoms have come back since chemotherapy has been discontinued    Breast cancer surveillance:  Breast exam 12/26/2023: Benign  mammograms in the left breast 09/22/2023: Benign breast density category C Bone density 06/07/2022: T-score -2.9: Osteoporosis recommended calcium and vitamin D and bisphosphonate therapy She is going to [facility]  for 3 to 4 weeks and is excited about that.   Return to clinic in 1 year for follow-up

## 2023-12-26 NOTE — Telephone Encounter (Signed)
 See previews note

## 2023-12-27 ENCOUNTER — Other Ambulatory Visit (INDEPENDENT_AMBULATORY_CARE_PROVIDER_SITE_OTHER): Payer: Self-pay

## 2023-12-27 ENCOUNTER — Telehealth: Payer: Self-pay | Admitting: *Deleted

## 2023-12-27 ENCOUNTER — Ambulatory Visit (INDEPENDENT_AMBULATORY_CARE_PROVIDER_SITE_OTHER): Admitting: Orthopedic Surgery

## 2023-12-27 DIAGNOSIS — M25551 Pain in right hip: Secondary | ICD-10-CM

## 2023-12-27 MED ORDER — OXYCODONE HCL 5 MG PO TABS: 5.0000 mg | ORAL_TABLET | ORAL | 0 refills | Status: AC | PRN

## 2023-12-27 NOTE — Progress Notes (Signed)
 Orthopedic Surgery Post-operative Office Visit  Procedure: right intertrochanteric femur fracture CMN Date of Surgery: 12/10/2023 (~2 weeks post-op)  Assessment: Patient is a 78 y.o. who is doing as expected after right hip CMN   Plan: -Operative plans complete -Weightbearing as tolerated -Staples removed today in office -Okay to let soap/water  run over the incision but do not submerge -Pain management: weaning oxycodone  -Return to office in 4 weeks, x-rays needed at next visit: AP/lateral right hip  ___________________________________________________________________________   Subjective: Patient has been at home since discharge from the hospital.  She has been ambulating with a walker.  Her hip pain has been getting better with time.  She is doing short distances around the house but has not tried walking any further.  She has not noticed any redness or drainage around her incisions.  She is left her dressings on since discharge from the hospital.  Objective:  General: no acute distress, appropriate affect Neurologic: alert, answering questions appropriately, following commands Respiratory: unlabored breathing on room air Skin: incisions are well-approximated with no erythema, induration, active/expressible drainage  MSK (RLE): Ambulating with walker with antalgic gait, EHL/TA/GSC intact, sensation intact to light touch in sural/saphenous/deep peroneal/superficial peroneal/tibial nerve distributions, foot warm and well-perfused  Imaging: X-rays of the right hip from 12/27/2023 were independently reviewed and interpreted, showing well reduced right intertrochanteric femur fracture with no change in alignment since immediate postoperative films.  No callus formation seen.  No lucency seen around the cephalomedullary rod.  Lag screws appear well centered within the femoral head.  No new fracture seen.   Patient name: Marie Day Patient MRN: [id number] Date of visit: 12/27/23

## 2023-12-27 NOTE — Telephone Encounter (Signed)
 Called pt per MD to advise [facility] testing was negative/not detected. Pt verbalized understanding of results and knows [employer] will be in touch to schedule 6 mo repeat lab.

## 2023-12-27 NOTE — Telephone Encounter (Signed)
 Copied from CRM [id number]. Topic: Referral - Status >> Dec 27, 2023  9:40 AM Harlene ORN wrote: Reason for CRM: It takes 5 business days to have a Home Health nurse to come to the patient for PT and OT treatment. Just FYI. Phone number for [facility]: [phone]  Please advise  [facility]

## 2024-01-01 ENCOUNTER — Ambulatory Visit: Admitting: Family Medicine

## 2024-01-01 ENCOUNTER — Encounter: Payer: Self-pay | Admitting: Family Medicine

## 2024-01-01 VITALS — BP 104/66 | HR 65 | Temp 97.7°F | Ht 64.0 in | Wt 177.0 lb

## 2024-01-01 DIAGNOSIS — S72146D Nondisplaced intertrochanteric fracture of unspecified femur, subsequent encounter for closed fracture with routine healing: Secondary | ICD-10-CM

## 2024-01-01 DIAGNOSIS — M810 Age-related osteoporosis without current pathological fracture: Secondary | ICD-10-CM

## 2024-01-01 NOTE — Progress Notes (Signed)
 Chief Complaint:  Marie Day is a 78 y.o. female who presents today for a TCM visit.  Assessment/Plan:   Intertrochanteric fracture Doing well with recovery.  Still having some muscle pain and weakness however this is improving.  We will check on her home health referral to physical therapy and Occupational Therapy as she has not yet heard from them.  She has an appoint with orthopedics next month.  Will order DME for shower chair today.  Recheck CBC and c-Met today.   Osteoporosis Continue Fosamax  per oncology.    Subjective:  HPI:  Summary of Hospital admission: Reason for admission: Intertrochanteric fracture. Date of admission: 12/10/2023 Date of discharge: 12/21/2023 Date of Interactive contact: 12/22/2023 Summary of Hospital course: Patient presented to the ED on 12/10/2023 after suffering a mechanical fall.  Was found to have acute intertrochanteric fracture of the right femoral neck and underwent open reduction and internal fixation on 12/10/2023.  Postoperatively she worked with PT and OT.  She was discharged to home in stable condition  Discussed the use of AI scribe software for clinical note transcription with the patient, who gave verbal consent to proceed.  History of Present Illness Marie Day is a 78 year old female who presents for follow-up after a femur fracture.  She sustained a femur fracture after tripping on a bench foot at church, resulting in a fall where she landed on her hip. The initial pain was severe, primarily muscle pain, and post-surgery, the muscles remain tender due to the surgical intervention. She underwent surgery where two screws were placed to stabilize the fracture. She was hospitalized for twelve days post-surgery and did not attend a rehab facility. During her hospital stay, she experienced episodes of hypotension when attempting to stand or sit up, which delayed her physical therapy initially. By the end of her hospital stay, she was  able to walk with assistance.  Currently, she is taking oxycodone , one tablet in the morning and one at night. She is working on increasing her physical activity as part of her recovery. She has not yet received follow-up physical therapy at home despite a referral and wants guidance on her exercises to ensure proper technique. She is cautious about standing in the shower and would prefer a shower chair for safety.  Her social support includes meals provided by her church three times a week and assistance from her husband, who is still able to drive her to appointments. Her grandson and his wife have also contributed by preparing meals.     ROS: Per HPI, otherwise a complete review of systems was negative.   PMH:  The following were reviewed and entered/updated in epic: Past Medical History:  Diagnosis Date   Arthritis    Breast cancer (HCC)    Cancer [facility])    Patient Active Problem List   Diagnosis Date Noted   Osteoporosis 01/01/2024   Elevated antinuclear antibody (ANA) level 09/27/2023   Insomnia 09/18/2023   Dyslipidemia 03/24/2023   Body mass index (BMI) 24.0-24.9, adult 02/28/2023   Joint swelling 02/28/2023   Other specified abnormal immunological findings in serum 02/28/2023   Pain in limb 02/28/2023   Tinnitus 12/21/2022   Breast cancer (HCC) 12/21/2022   Joint stiffness 09/01/2022   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   Polyarthralgia 02/08/2022   S/P breast reconstruction 06/29/2021   Malignant neoplasm of upper-outer quadrant of right breast in female, estrogen receptor negative (HCC) 12/21/2020   Anemia 10/27/2020   Prediabetes 10/27/2020  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   Unspecified age-related cataract 12/14/2015   Past Surgical History:  Procedure Laterality Date   BREAST BIOPSY Right 01/30/2023   US  [name] BX W LOC DEV 1ST LESION IMG BX SPEC US  GUIDE 01/30/2023 [facility] MAMMOGRAPHY   BREAST RECONSTRUCTION  WITH PLACEMENT OF TISSUE EXPANDER AND FLEX HD (ACELLULAR HYDRATED DERMIS) Right 06/29/2021   Procedure: RIGHT BREAST RECONSTRUCTION WITH PLACEMENT OF TISSUE EXPANDER AND FLEX HD (ACELLULAR HYDRATED DERMIS);  Surgeon: Elisabeth Craig RAMAN, MD;  Location: [facility];  Service: Plastics;  Laterality: Right;   BREAST REDUCTION WITH MASTOPEXY Left 06/30/2022   Procedure: LEFT BREAST REDUCTION WITH MASTOPEXY;  Surgeon: Lowery Estefana RAMAN, DO;  Location: [facility];  Service: Plastics;  Laterality: Left;   INTRAMEDULLARY (IM) NAIL INTERTROCHANTERIC Right 12/10/2023   Procedure: FIXATION, FRACTURE, INTERTROCHANTERIC, WITH INTRAMEDULLARY ROD;  Surgeon: Georgina Ozell LABOR, MD;  Location: [facility];  Service: Orthopedics;  Laterality: Right;   MASTECTOMY Right 06/29/2021   MASTECTOMY W/ SENTINEL NODE BIOPSY Right 06/29/2021   Procedure: RIGHT MASTECTOMY WITH SENTINEL LYMPH NODE BIOPSY;  Surgeon: Vanderbilt Ned, MD;  Location: [facility];  Service: General;  Laterality: Right;   PORT-A-CATH REMOVAL Left 06/30/2022   Procedure: REMOVAL PORT-A-CATH;  Surgeon: Lowery Estefana RAMAN, DO;  Location: [facility];  Service: Plastics;  Laterality: Left;   REMOVAL OF TISSUE EXPANDER AND PLACEMENT OF IMPLANT Right 06/30/2022   Procedure: REMOVAL OF TISSUE EXPANDER AND PLACEMENT OF IMPLANT;  Surgeon: Lowery Estefana RAMAN, DO;  Location: [facility];  Service: Plastics;  Laterality: Right;   WISDOM TOOTH EXTRACTION      Family History  Problem Relation Age of Onset   Hyperlipidemia Mother    Bone cancer Mother    Hearing loss Mother    Hearing loss Father    Hyperlipidemia Sister    Early death Brother    Birth defects Brother    Early death Maternal Grandmother    Pancreatic cancer Maternal Grandmother    Cancer Maternal Grandfather    Pancreatic cancer Paternal Grandmother    Cancer Paternal Grandfather    Depression Son    Colon cancer Neg Hx     Breast cancer Neg Hx     Medications- Reconciled discharge and current medications in Epic.  Current Outpatient Medications  Medication Sig Dispense Refill   acetaminophen  (TYLENOL ) 500 MG tablet Take 2 tablets (1,000 mg total) by mouth every 8 (eight) hours for 14 days.     alendronate  (FOSAMAX ) 70 MG tablet TAKE 1 TABLET (70 MG TOTAL) BY MOUTH ONCE A WEEK. TAKE WITH A FULL GLASS OF WATER  ON AN EMPTY STOMACH. 12 tablet 3   aspirin  81 MG chewable tablet Chew 1 tablet (81 mg total) by mouth 2 (two) times daily.     baclofen  (LIORESAL ) 10 MG tablet Take 1 tablet (10 mg total) by mouth 3 (three) times daily as needed for muscle spasms.     ferrous sulfate  325 (65 FE) MG EC tablet Take 1 tablet (325 mg total) by mouth 2 (two) times daily. 60 tablet 0   Glucosamine-Chondroitin (OSTEO BI-FLEX REGULAR STRENGTH PO) Take 1 Dose by mouth in the morning and at bedtime.     oxyCODONE  (ROXICODONE ) 5 MG immediate release tablet Take 1 tablet (5 mg total) by mouth every 4 (four) hours as needed for up to 7 days for severe pain (pain score 7-10). 25 tablet 0   senna (SENOKOT)  8.6 MG TABS tablet Take 1 tablet (8.6 mg total) by mouth 2 (two) times daily for 14 days.     traZODone  (DESYREL ) 50 MG tablet TAKE 0.5-1 TABLETS BY MOUTH AT BEDTIME AS NEEDED FOR SLEEP. 90 tablet 2   UNABLE TO FIND Take 1 Dose by mouth as needed (sleep). CBD Gummies     UNABLE TO FIND Take 2 capsules by mouth in the morning. Fish oil & Tumeric     Vitamin D-Vitamin K (VITAMIN K2-VITAMIN D3 PO) Take 1 tablet by mouth in the morning.     No current facility-administered medications for this visit.    Allergies-reviewed and updated Allergies  Allergen Reactions   Sulfa Antibiotics Hives and Other (See Comments)    Pt reports foggy brain, Rashy/ hives, Red spots on Chest    Social History   Socioeconomic History   Marital status: Married    Spouse name: Not on file   Number of children: Not on file   Years of education: Not on  file   Highest education level: Some college, no degree  Occupational History   Not on file  Tobacco Use   Smoking status: Never   Smokeless tobacco: Never  Vaping Use   Vaping status: Never Used  Substance and Sexual Activity   Alcohol use: Not Currently   Drug use: Never   Sexual activity: Not Currently  Other Topics Concern   Not on file  Social History Narrative   Not on file   Social Drivers of Health   Financial Resource Strain: Low Risk  (10/10/2023)   Overall Financial Resource Strain (CARDIA)    Difficulty of Paying Living Expenses: Not hard at all  Food Insecurity: No Food Insecurity (12/10/2023)   Hunger Vital Sign    Worried About Running Out of Food in the Last Year: Never true    Ran Out of Food in the Last Year: Never true  Transportation Needs: No Transportation Needs (12/10/2023)   PRAPARE - Administrator, Civil Service (Medical): No    Lack of Transportation (Non-Medical): No  Physical Activity: Inactive (10/10/2023)   Exercise Vital Sign    Days of Exercise per Week: 0 days    Minutes of Exercise per Session: 0 min  Stress: No Stress Concern Present (10/10/2023)   [employer] of Occupational Health - Occupational Stress Questionnaire    Feeling of Stress : Not at all  Social Connections: Unknown (12/10/2023)   Social Connection and Isolation Panel    Frequency of Communication with Friends and Family: Three times a week    Frequency of Social Gatherings with Friends and Family: Three times a week    Attends Religious Services: Not on file    Active Member of Clubs or Organizations: No    Attends Banker Meetings: Never    Marital Status: Married        Objective:  Physical Exam: BP 104/66   Pulse 65   Temp 97.7 F (36.5 C) (Temporal)   Ht 5' 4 (1.626 m)   Wt 177 lb (80.3 kg)   SpO2 96%   BMI 30.38 kg/m   Gen: NAD, resting comfortably CV: RRR with no murmurs appreciated Pulm: NWOB, CTAB with no crackles,  wheezes, or rhonchi GI: Normal bowel sounds present. Soft, Nontender, Nondistended. MSK: No edema, cyanosis, or clubbing noted Skin: Warm, dry Neuro: Grossly normal, moves all extremities Psych: Normal affect and thought content  Time Spent: 45 minutes of total time was  spent on the date of the encounter performing the following actions: chart review prior to seeing the patient, obtaining history, performing a medically necessary exam, counseling on the treatment plan, placing orders, and documenting in our EHR.       Worth [name]. Kennyth, MD 01/01/2024 2:53 PM

## 2024-01-01 NOTE — Assessment & Plan Note (Signed)
 Continue Fosamax  per oncology.

## 2024-01-01 NOTE — Patient Instructions (Addendum)
 It was very nice to see you today!  VISIT SUMMARY: Today, we discussed your recovery after your femur fracture surgery. We reviewed your current medications, physical activity, and the need for home health physical therapy. We also talked about your osteoporosis treatment and the importance of taking your medication regularly.  YOUR PLAN: CLOSED FRACTURE OF RIGHT FEMUR: You are recovering from a femur fracture surgery and experiencing muscle pain and tenderness. -We will arrange for home health physical therapy services to help with your recovery. -We will order blood work to monitor your blood counts. -We will provide a shower chair to ensure your safety while bathing.  AGE-RELATED OSTEOPOROSIS: -It is important to take your osteoporosis medication regularly to help strengthen your bones.  Return if symptoms worsen or fail to improve.   Take care, Dr Kennyth  PLEASE NOTE:  If you had any lab tests, please let us  know if you have not heard back within a few days. You may see your results on mychart before we have a chance to review them but we will give you a call once they are reviewed by us .   If we ordered any referrals today, please let us  know if you have not heard from their office within the next week.   If you had any urgent prescriptions sent in today, please check with the pharmacy within an hour of our visit to make sure the prescription was transmitted appropriately.   Please try these tips to maintain a healthy lifestyle:  Eat at least 3 REAL meals and 1-2 snacks per day.  Aim for no more than 5 hours between eating.  If you eat breakfast, please do so within one hour of getting up.   Each meal should contain half fruits/vegetables, one quarter protein, and one quarter carbs (no bigger than a computer mouse)  Cut down on sweet beverages. This includes juice, soda, and sweet tea.   Drink at least 1 glass of water  with each meal and aim for at least 8 glasses per  day  Exercise at least 150 minutes every week.

## 2024-01-02 ENCOUNTER — Ambulatory Visit: Payer: Self-pay | Admitting: Family Medicine

## 2024-01-02 LAB — CBC
HCT: 31.5 % — ABNORMAL LOW (ref 36.0–46.0)
Hemoglobin: 10.1 g/dL — ABNORMAL LOW (ref 12.0–15.0)
MCHC: 32 g/dL (ref 30.0–36.0)
MCV: 89.3 fl (ref 78.0–100.0)
Platelets: 402 K/uL — ABNORMAL HIGH (ref 150.0–400.0)
RBC: 3.53 Mil/uL — ABNORMAL LOW (ref 3.87–5.11)
RDW: 15.6 % — ABNORMAL HIGH (ref 11.5–15.5)
WBC: 5.2 K/uL (ref 4.0–10.5)

## 2024-01-02 LAB — COMPREHENSIVE METABOLIC PANEL WITH GFR
ALT: 17 U/L (ref 0–35)
AST: 22 U/L (ref 0–37)
Albumin: 4.3 g/dL (ref 3.5–5.2)
Alkaline Phosphatase: 182 U/L — ABNORMAL HIGH (ref 39–117)
BUN: 6 mg/dL (ref 6–23)
CO2: 25 meq/L (ref 19–32)
Calcium: 9 mg/dL (ref 8.4–10.5)
Chloride: 103 meq/L (ref 96–112)
Creatinine, Ser: 0.62 mg/dL (ref 0.40–1.20)
GFR: 86.17 mL/min (ref >60.00)
Glucose, Bld: 77 mg/dL (ref 70–99)
Potassium: 3.9 meq/L (ref 3.5–5.1)
Sodium: 138 meq/L (ref 135–145)
Total Bilirubin: 0.5 mg/dL (ref 0.2–1.2)
Total Protein: 6.9 g/dL (ref 6.0–8.3)

## 2024-01-02 NOTE — Progress Notes (Signed)
 Her blood counts are trending up.  We can recheck again at her next office visit.  Her alkaline phosphatase is elevated however this is likely due to her recent hip fracture.  We can recheck this again in next office visit as well.  Do not need to make any changes to her treatment plan at this time.

## 2024-01-03 DIAGNOSIS — Z6830 Body mass index (BMI) 30.0-30.9, adult: Secondary | ICD-10-CM | POA: Diagnosis not present

## 2024-01-03 DIAGNOSIS — Z9181 History of falling: Secondary | ICD-10-CM | POA: Diagnosis not present

## 2024-01-03 DIAGNOSIS — Z853 Personal history of malignant neoplasm of breast: Secondary | ICD-10-CM | POA: Diagnosis not present

## 2024-01-03 DIAGNOSIS — Z9011 Acquired absence of right breast and nipple: Secondary | ICD-10-CM | POA: Diagnosis not present

## 2024-01-03 DIAGNOSIS — E785 Hyperlipidemia, unspecified: Secondary | ICD-10-CM | POA: Diagnosis not present

## 2024-01-03 DIAGNOSIS — I951 Orthostatic hypotension: Secondary | ICD-10-CM | POA: Diagnosis not present

## 2024-01-03 DIAGNOSIS — D649 Anemia, unspecified: Secondary | ICD-10-CM | POA: Diagnosis not present

## 2024-01-03 DIAGNOSIS — M80051D Age-related osteoporosis with current pathological fracture, right femur, subsequent encounter for fracture with routine healing: Secondary | ICD-10-CM | POA: Diagnosis not present

## 2024-01-03 DIAGNOSIS — E66811 Obesity, class 1: Secondary | ICD-10-CM | POA: Diagnosis not present

## 2024-01-03 DIAGNOSIS — Z7982 Long term (current) use of aspirin: Secondary | ICD-10-CM | POA: Diagnosis not present

## 2024-01-04 ENCOUNTER — Telehealth: Payer: Self-pay | Admitting: *Deleted

## 2024-01-04 NOTE — Telephone Encounter (Signed)
 Copied from CRM #[id number]. Topic: Clinical - Home Health Verbal Orders >> Jan 04, 2024 12:44 PM Lavanda D wrote: Caller/Agency: Will/OT with [facility] Callback Number: [phone] Service Requested: Occupational Therapy Frequency: 2x a week for 2 weeks, 1x week for 1 week Any new concerns about the patient? No

## 2024-01-04 NOTE — Telephone Encounter (Signed)
 Error

## 2024-01-04 NOTE — Telephone Encounter (Signed)
 DME refaxed today at [phone]

## 2024-01-04 NOTE — Telephone Encounter (Signed)
 Ok with me. Please place any necessary orders.

## 2024-01-04 NOTE — Telephone Encounter (Signed)
 Copied from CRM #[id number]. Topic: Clinical - Home Health Verbal Orders >> Jan 03, 2024 11:46 AM Adelita BRAVO wrote: Caller/Agency: Almira, PT with [facility] Callback Number: [phone] (secure line) Service Requested: Physical Therapy Frequency: 2x/week for 3 weeks, then 1x/week for 1 week. Any new concerns about the patient? No

## 2024-01-04 NOTE — Telephone Encounter (Unsigned)
 Copied from CRM [id number]. Topic: General - Other >> Jan 04, 2024 12:01 PM Taleah C wrote: Reason for CRM: [facility] medical called and stated that they sent a fax on 8/1 regarding a request for a shower Chair. They asked for a callback at [phone]. She stated that she will resend it today.  Select option 1, exts: [phone]

## 2024-01-04 NOTE — Telephone Encounter (Signed)
 SABRA

## 2024-01-05 ENCOUNTER — Telehealth: Payer: Self-pay | Admitting: *Deleted

## 2024-01-05 NOTE — Telephone Encounter (Signed)
 Copied from CRM #[id number]. Topic: Clinical - Lab/Test Results >> Jan 05, 2024 12:07 PM Franky GRADE wrote: Reason for CRM: Patient returning a call she received from [facility], advised patient of the message Dr.Parker left. Patient understood and had no questions.

## 2024-01-05 NOTE — Telephone Encounter (Signed)
 Called Almira, PT with [facility] # [phone]  VO given

## 2024-01-05 NOTE — Telephone Encounter (Signed)
 Will/OT with [facility] #  [phone] LVM with VO

## 2024-01-08 ENCOUNTER — Telehealth: Payer: Self-pay | Admitting: Orthopedic Surgery

## 2024-01-08 ENCOUNTER — Encounter: Payer: Self-pay | Admitting: Hematology and Oncology

## 2024-01-08 ENCOUNTER — Telehealth: Payer: Self-pay | Admitting: *Deleted

## 2024-01-08 MED ORDER — OXYCODONE HCL 5 MG PO TABS: 2.5000 mg | ORAL_TABLET | ORAL | 0 refills | Status: AC | PRN

## 2024-01-08 NOTE — Telephone Encounter (Signed)
 Copied from CRM [id number]. Topic: Clinical - Order For Equipment >> Dec 25, 2023  9:24 AM Donna BRAVO wrote: Reason for CRM: Ronal with [facility] supply order for bedside commode and shower chair  Ronal will be re faxing order   DME faxed patient requesting shower chair  [facility]

## 2024-01-08 NOTE — Telephone Encounter (Signed)
 Copied from CRM [id number]. Topic: Clinical - Order For Equipment >> Dec 25, 2023  9:24 AM Donna BRAVO wrote: Reason for CRM: Ronal with [facility] supply order for bedside commode and shower chair  Ronal will be re faxing order

## 2024-01-08 NOTE — Telephone Encounter (Signed)
 Form DME faxed to [phone]

## 2024-01-08 NOTE — Telephone Encounter (Signed)
 Copied from CRM #[id number]. Topic: General - Other >> Jan 08, 2024 11:06 AM Thersia BROCKS wrote: Reason for CRM: Patient called in wanting to speak with Elora, would like a callback   LVM to return call  [facility]

## 2024-01-08 NOTE — Telephone Encounter (Signed)
 Left message to return call to our office at their convenience.

## 2024-01-08 NOTE — Telephone Encounter (Signed)
Patient requesting Oxycodone refill  

## 2024-01-08 NOTE — Telephone Encounter (Signed)
 Shower chair DME faxed x3

## 2024-01-08 NOTE — Telephone Encounter (Signed)
 Pt called requesting a refill of oxycodone . Please send to [facility] [address] Pt phone number is [phone].

## 2024-01-09 ENCOUNTER — Telehealth: Payer: Self-pay | Admitting: *Deleted

## 2024-01-09 ENCOUNTER — Telehealth: Payer: Self-pay | Admitting: Family Medicine

## 2024-01-09 ENCOUNTER — Other Ambulatory Visit: Payer: Self-pay | Admitting: *Deleted

## 2024-01-09 NOTE — Telephone Encounter (Signed)
 Can we clarify? I believe she was talking about the AVS. If so I have corrected it in her chart.

## 2024-01-09 NOTE — Telephone Encounter (Signed)
 Copied from CRM #[id number]. Topic: General - Other >> Jan 09, 2024  2:27 PM Thersia BROCKS wrote: Reason for CRM: Geni from [facility] health will like a callback regarding patient shower chair   [phone]   Spoke with Geni  at [phone] Notified DME was faxed to [facility] x3  Geni will call [facility] for more information  Will Call back in DME need to fax to [facility]  [facility]

## 2024-01-09 NOTE — Telephone Encounter (Signed)
 DME Faxed to [facility] [phone]

## 2024-01-09 NOTE — Telephone Encounter (Signed)
 pt called and stated that her insurance does not have a request for a shwer chair. She did inform them that the office has sent it 3 times already but they're asking for another one. Please advise

## 2024-01-09 NOTE — Telephone Encounter (Signed)
 [facility] faxed Home Health Certificate (Order ID [id number]), to be filled out by provider. Patient requested to send it back via Fax within ASAP. Document is located in providers tray at front office.Please advise at [phone].

## 2024-01-10 ENCOUNTER — Telehealth: Payer: Self-pay | Admitting: *Deleted

## 2024-01-10 DIAGNOSIS — E785 Hyperlipidemia, unspecified: Secondary | ICD-10-CM | POA: Diagnosis not present

## 2024-01-10 DIAGNOSIS — Z9181 History of falling: Secondary | ICD-10-CM | POA: Diagnosis not present

## 2024-01-10 DIAGNOSIS — Z853 Personal history of malignant neoplasm of breast: Secondary | ICD-10-CM | POA: Diagnosis not present

## 2024-01-10 DIAGNOSIS — Z9011 Acquired absence of right breast and nipple: Secondary | ICD-10-CM | POA: Diagnosis not present

## 2024-01-10 DIAGNOSIS — M80051D Age-related osteoporosis with current pathological fracture, right femur, subsequent encounter for fracture with routine healing: Secondary | ICD-10-CM | POA: Diagnosis not present

## 2024-01-10 DIAGNOSIS — Z7982 Long term (current) use of aspirin: Secondary | ICD-10-CM | POA: Diagnosis not present

## 2024-01-10 DIAGNOSIS — Z6830 Body mass index (BMI) 30.0-30.9, adult: Secondary | ICD-10-CM | POA: Diagnosis not present

## 2024-01-10 DIAGNOSIS — E66811 Obesity, class 1: Secondary | ICD-10-CM | POA: Diagnosis not present

## 2024-01-10 DIAGNOSIS — I951 Orthostatic hypotension: Secondary | ICD-10-CM | POA: Diagnosis not present

## 2024-01-10 DIAGNOSIS — D649 Anemia, unspecified: Secondary | ICD-10-CM | POA: Diagnosis not present

## 2024-01-10 NOTE — Telephone Encounter (Signed)
Placed to be reviewed in PCP office

## 2024-01-10 NOTE — Telephone Encounter (Signed)
 SABRA

## 2024-01-10 NOTE — Telephone Encounter (Signed)
 Copied from CRM #[id number]. Topic: General - Other >> Jan 10, 2024  8:58 AM Turkey A wrote: Reason for CRM: [facility] with [facility] called and would like for Clinical Notes, , Scripts, Demo, Orders sent together-if not sent all together it will not accepted. Fax number is [phone]   Faxed multiple time  [facility]

## 2024-01-11 NOTE — Telephone Encounter (Signed)
 [name] from [facility] [place] called asking if DME could be faxed once more. States clinical notes,demographics, and script with PCP's signature (not stamped) be faxed to [phone]. I informed her that fax has been sent multiple times but she informed me that [facility] still has it as pending, which means they are missing a piece. Marie Day would like to be called and informed when it's been sent. She can be reached @ [phone], Option 1, and ext [phone].

## 2024-01-11 NOTE — Telephone Encounter (Signed)
 DME was faxed to [facility]

## 2024-01-15 ENCOUNTER — Telehealth: Payer: Self-pay | Admitting: *Deleted

## 2024-01-15 NOTE — Telephone Encounter (Signed)
 Called pt to make aware of negative Guardant reveal results. Pt verbalized understanding

## 2024-01-18 ENCOUNTER — Encounter: Payer: Self-pay | Admitting: Hematology and Oncology

## 2024-01-19 ENCOUNTER — Telehealth: Payer: Self-pay | Admitting: Family Medicine

## 2024-01-21 ENCOUNTER — Encounter: Payer: Self-pay | Admitting: Orthopedic Surgery

## 2024-01-21 DIAGNOSIS — S72141G Displaced intertrochanteric fracture of right femur, subsequent encounter for closed fracture with delayed healing: Secondary | ICD-10-CM

## 2024-01-23 ENCOUNTER — Ambulatory Visit: Payer: No Typology Code available for payment source | Admitting: Plastic Surgery

## 2024-01-23 MED ORDER — HYDROCODONE-ACETAMINOPHEN 5-325 MG PO TABS: 1.0000 | ORAL_TABLET | ORAL | 0 refills | Status: DC | PRN

## 2024-01-24 ENCOUNTER — Telehealth: Payer: Self-pay | Admitting: *Deleted

## 2024-01-24 DIAGNOSIS — Z7982 Long term (current) use of aspirin: Secondary | ICD-10-CM | POA: Diagnosis not present

## 2024-01-24 DIAGNOSIS — D649 Anemia, unspecified: Secondary | ICD-10-CM | POA: Diagnosis not present

## 2024-01-24 DIAGNOSIS — E785 Hyperlipidemia, unspecified: Secondary | ICD-10-CM | POA: Diagnosis not present

## 2024-01-24 DIAGNOSIS — M80051D Age-related osteoporosis with current pathological fracture, right femur, subsequent encounter for fracture with routine healing: Secondary | ICD-10-CM | POA: Diagnosis not present

## 2024-01-24 DIAGNOSIS — Z853 Personal history of malignant neoplasm of breast: Secondary | ICD-10-CM | POA: Diagnosis not present

## 2024-01-24 DIAGNOSIS — Z6830 Body mass index (BMI) 30.0-30.9, adult: Secondary | ICD-10-CM | POA: Diagnosis not present

## 2024-01-24 DIAGNOSIS — I951 Orthostatic hypotension: Secondary | ICD-10-CM | POA: Diagnosis not present

## 2024-01-24 DIAGNOSIS — Z9011 Acquired absence of right breast and nipple: Secondary | ICD-10-CM | POA: Diagnosis not present

## 2024-01-24 DIAGNOSIS — Z9181 History of falling: Secondary | ICD-10-CM | POA: Diagnosis not present

## 2024-01-24 DIAGNOSIS — E66811 Obesity, class 1: Secondary | ICD-10-CM | POA: Diagnosis not present

## 2024-01-24 NOTE — Telephone Encounter (Signed)
 Copied from CRM #[id number]. Topic: Clinical - Prescription Issue >> Jan 24, 2024  8:54 AM Laymon [name] wrote: Reason for CRM: Patient was given ferrous sulfate  325 (65 FE) MG EC tablet at the hospital- ended 8/29 wanting to get another prescription for it.  Ok to send Refill? Akaylah Lalley,RMA

## 2024-01-25 ENCOUNTER — Ambulatory Visit (INDEPENDENT_AMBULATORY_CARE_PROVIDER_SITE_OTHER): Admitting: Orthopedic Surgery

## 2024-01-25 ENCOUNTER — Other Ambulatory Visit (INDEPENDENT_AMBULATORY_CARE_PROVIDER_SITE_OTHER)

## 2024-01-25 DIAGNOSIS — M25551 Pain in right hip: Secondary | ICD-10-CM

## 2024-01-25 NOTE — Telephone Encounter (Signed)
 Ok with me. Please place any necessary orders.

## 2024-01-25 NOTE — Progress Notes (Signed)
 Orthopedic Surgery Post-operative Office Visit   Procedure: right intertrochanteric femur fracture CMN Date of Surgery: 12/10/2023 (~6 weeks post-op)   Assessment: Patient is a 78 y.o. who is doing well after surgery     Plan: -Operative plans complete -Weightbearing as tolerated -Okay to submerge wounds at this point -Pain management: hydrocodone , will wean to tramadol  next -Return to office in 6 weeks, x-rays needed at next visit: AP/lateral right hip   ___________________________________________________________________________     Subjective: Patient states that she is doing well at this point.  She is ambulating with a walker.  She is able to ambulate up and down stairs as well.  She is not having any consistent pain in the hip.  She is sparingly using hydrocodone  to control her pain when she does have it.  She has not noticed any redness or drainage around her incisions.   Objective:   General: no acute distress, appropriate affect Neurologic: alert, answering questions appropriately, following commands Respiratory: unlabored breathing on room air Skin: incisions are well healed with no erythema, induration, active/expressible drainage   MSK (RLE): Ambulating with walker with antalgic gait, EHL/TA/GSC intact, sensation intact to light touch in sural/saphenous/deep peroneal/superficial peroneal/tibial nerve distributions, foot warm and well-perfused   Imaging: X-rays of the right hip from 01/25/2024 were independently reviewed and interpreted, showing well reduced intertrochanteric femur fracture.  Alignment has not changed since last films on 12/10/2023.  No lucency seen around any of the interlocking or lag screws.  No evidence of screw cut out.  No new fracture seen.     Patient name: Marie Day Patient MRN: [id number] Date of visit: 01/25/24

## 2024-01-26 ENCOUNTER — Telehealth: Payer: Self-pay | Admitting: *Deleted

## 2024-01-26 NOTE — Telephone Encounter (Signed)
 Copied from CRM [id number]. Topic: Clinical - Home Health Verbal Orders >> Jan 26, 2024 11:04 AM Laymon [name] wrote: Caller/Agency: Almira- [facility] Callback Number: [phone] Service Requested: Physical Therapy Frequency: 1 week 4 Any new concerns about the patient? No   OK to give VO? Nashali Ditmer,RMA

## 2024-01-29 NOTE — Telephone Encounter (Signed)
 Ok with me. Please place any necessary orders.

## 2024-01-29 NOTE — Telephone Encounter (Signed)
 Called [facility] Number: [phone] LVM with VO

## 2024-02-05 MED ORDER — HYDROCODONE-ACETAMINOPHEN 5-325 MG PO TABS: 1.0000 | ORAL_TABLET | ORAL | 0 refills | Status: DC | PRN

## 2024-02-05 NOTE — Addendum Note (Signed)
 Addended by: GEORGINA SHARPER on: 02/05/2024 09:02 AM   Modules accepted: Orders

## 2024-02-06 ENCOUNTER — Telehealth: Payer: Self-pay | Admitting: Orthopedic Surgery

## 2024-02-06 NOTE — Telephone Encounter (Signed)
 Patient called and said that her dentist didn't receive the paperwork Dr. Georgina sent. CB#[id number]

## 2024-02-07 NOTE — Telephone Encounter (Signed)
 Refaxed form To:               Recipient at [phone] Subject:          Completed form Result:           The transmission was successful. Explanation:      All Pages Ok Pages Sent:       2 Connect Time:     1 minutes, 5 seconds Transmit Time:    02/07/2024 11:31 Transfer Rate:    9600 Status Code:      0000 Retry Count:      0 Job Id:           [id number] Unique Id:        [id number] Fax Line:         41 Fax Server:       [id number]

## 2024-02-12 NOTE — Addendum Note (Signed)
 Addended by: GEORGINA SHARPER on: 02/12/2024 09:32 PM   Modules accepted: Orders

## 2024-02-14 ENCOUNTER — Ambulatory Visit: Attending: Orthopedic Surgery

## 2024-02-14 DIAGNOSIS — R2689 Other abnormalities of gait and mobility: Secondary | ICD-10-CM | POA: Insufficient documentation

## 2024-02-14 DIAGNOSIS — M79604 Pain in right leg: Secondary | ICD-10-CM | POA: Diagnosis not present

## 2024-02-14 DIAGNOSIS — S72141G Displaced intertrochanteric fracture of right femur, subsequent encounter for closed fracture with delayed healing: Secondary | ICD-10-CM | POA: Insufficient documentation

## 2024-02-14 DIAGNOSIS — M6281 Muscle weakness (generalized): Secondary | ICD-10-CM | POA: Insufficient documentation

## 2024-02-14 NOTE — Therapy (Signed)
 OUTPATIENT PHYSICAL THERAPY LOWER EXTREMITY EVALUATION   Patient Name: Marie Day MRN: [id number] DOB:1945/09/16, 78 y.o., female Today's Date: 02/14/2024  END OF SESSION:  PT End of Session - 02/14/24 1147     Visit Number 1    Date for Recertification  04/10/24    Authorization Type [employer]-no auth required    Progress Note Due on Visit 10    PT Start Time 1101    PT Stop Time 1146    PT Time Calculation (min) 45 min    Activity Tolerance Patient tolerated treatment well    Behavior During Therapy [facility] for tasks assessed/performed          Past Medical History:  Diagnosis Date   Arthritis    Breast cancer (HCC)    Cancer (HCC)    Past Surgical History:  Procedure Laterality Date   BREAST BIOPSY Right 01/30/2023   US  [name] BX W LOC DEV 1ST LESION IMG BX SPEC US  GUIDE 01/30/2023 [facility] MAMMOGRAPHY   BREAST RECONSTRUCTION WITH PLACEMENT OF TISSUE EXPANDER AND FLEX HD (ACELLULAR HYDRATED DERMIS) Right 06/29/2021   Procedure: RIGHT BREAST RECONSTRUCTION WITH PLACEMENT OF TISSUE EXPANDER AND FLEX HD (ACELLULAR HYDRATED DERMIS);  Surgeon: Elisabeth Craig RAMAN, MD;  Location: [facility];  Service: Plastics;  Laterality: Right;   BREAST REDUCTION WITH MASTOPEXY Left 06/30/2022   Procedure: LEFT BREAST REDUCTION WITH MASTOPEXY;  Surgeon: Lowery Estefana RAMAN, DO;  Location: [facility];  Service: Plastics;  Laterality: Left;   INTRAMEDULLARY (IM) NAIL INTERTROCHANTERIC Right 12/10/2023   Procedure: FIXATION, FRACTURE, INTERTROCHANTERIC, WITH INTRAMEDULLARY ROD;  Surgeon: Georgina Ozell LABOR, MD;  [facility];  Service: Orthopedics;  Laterality: Right;   MASTECTOMY Right 06/29/2021   MASTECTOMY W/ SENTINEL NODE BIOPSY Right 06/29/2021   Procedure: RIGHT MASTECTOMY WITH SENTINEL LYMPH NODE BIOPSY;  Surgeon: Vanderbilt Ned, MD;  Location: [facility];  Service: General;  Laterality: Right;   PORT-A-CATH REMOVAL Left 06/30/2022   Procedure:  REMOVAL PORT-A-CATH;  Surgeon: Lowery Estefana RAMAN, DO;  Location: [facility];  Service: Plastics;  Laterality: Left;   REMOVAL OF TISSUE EXPANDER AND PLACEMENT OF IMPLANT Right 06/30/2022   Procedure: REMOVAL OF TISSUE EXPANDER AND PLACEMENT OF IMPLANT;  Surgeon: Lowery Estefana RAMAN, DO;  Location:  [facility];  Service: Plastics;  Laterality: Right;   WISDOM TOOTH EXTRACTION     Patient Active Problem List   Diagnosis Date Noted   Osteoporosis 01/01/2024   Elevated antinuclear antibody (ANA) level 09/27/2023   Insomnia 09/18/2023   Dyslipidemia 03/24/2023   Body mass index (BMI) 24.0-24.9, adult 02/28/2023   Joint swelling 02/28/2023   Other specified abnormal immunological findings in serum 02/28/2023   Pain in limb 02/28/2023   Tinnitus 12/21/2022   Breast cancer (HCC) 12/21/2022   Joint stiffness 09/01/2022   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   Polyarthralgia 02/08/2022   S/P breast reconstruction 06/29/2021   Malignant neoplasm of upper-outer quadrant of right breast in female, estrogen receptor negative (HCC) 12/21/2020   Anemia 10/27/2020   Prediabetes 10/27/2020   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   Unspecified age-related cataract 12/14/2015    PCP: Kennyth Bart, MD  REFERRING PROVIDER: Georgina Ozell, MD  REFERRING DIAG:  Diagnosis  S72.141G (ICD-10-CM) - Displaced intertrochanteric fracture of right femur, subsequent encounter for closed fracture with delayed healing    THERAPY DIAG:  Muscle weakness (generalized) - Plan: PT plan of care cert/re-cert  Other abnormalities of  gait and mobility - Plan: PT plan of care cert/re-cert  Pain in right leg  Rationale for Evaluation and Treatment: Rehabilitation  ONSET DATE: 12/10/23- fall with femur fracture   SUBJECTIVE:   SUBJECTIVE STATEMENT: Pt is a 78 y.o. female who presents to PT s/p IM rod placement on 12/10/23 after sustaining a  fracture due to fall.  She tripped and reports no prior balance deficits.  She was hospitalized x 12 days.  She is WBAT now and using a rolling walker.   She has been walking outside for exercise x 15 minutes.   From MD note 01/25/24:  Patient states that she is doing well at this point. She is ambulating with a walker. She is able to ambulate up and down stairs as well. She is not having any consistent pain in the hip. She is sparingly using hydrocodone  to control her pain when she does have it. She has not noticed any redness or drainage around her incisions.   PERTINENT HISTORY: Malignant neoplasm of upper-outer quadrant of right breast in female, estrogen receptor negative (HCC) 2023, IM rod placement  PAIN: 02/14/24 Are you having pain? Yes: NPRS scale: 0-6/10 Pain location: Rt hip/leg Pain description: sharp/ache Aggravating factors: daily activities, walking  Relieving factors: pain medication, ice  PRECAUTIONS: Other: breast cancer and mastectomy   RED FLAGS: None   WEIGHT BEARING RESTRICTIONS: No WBAT per MD   FALLS:  Has patient fallen in last 6 months? Yes. Number of falls 1 on 12/10/23 with IM rod placement on Rt  LIVING ENVIRONMENT: Lives with: lives with their family Lives in: House/apartment Stairs: Yes: Internal: 4 steps; on right going up Has following equipment at home: [name] - 2 wheeled  PLOF: Independent and Leisure: none  PATIENT GOALS: improve mobility, walk without walker  NEXT MD VISIT: 03/31/24  OBJECTIVE:  Note: Objective measures were completed at Evaluation unless otherwise noted.   COGNITION: Overall cognitive status: Within functional limits for tasks assessed     SENSATION: WFL   POSTURE: rounded shoulders, forward head, and weight shift left  PALPATION: Tender over incisions in the Rt anterior and lateral thigh  LOWER EXTREMITY ROM: WFLs   LOWER EXTREMITY MMT: Rt hip 4-/5, Lt 4+/5, bil knees 4+/5   FUNCTIONAL TESTS:  02/14/24 5  times sit to stand:with UE support 19.30 seconds  6 minute walk test: 552 feet with walker with 4/10 RPE -age related norm is 1250 feet   GAIT: Distance walked: 50 Assistive device utilized: Environmental consultant - 2 wheeled Level of assistance: Modified independence Comments: slow mobility, reduced step length                                                                                                                             TREATMENT DATE:  02/14/24 Findings from evaluation discussed, pt educated on plan of care, HEP initiated.    Scar massage education    PATIENT EDUCATION:  Education details: Access Code: [email or web address]  Person educated: Patient Education method: Explanation, Demonstration, and Handouts Education comprehension: verbalized understanding and returned demonstration  HOME EXERCISE PROGRAM: Access Code: [email or web address] URL: [email or web address] Date: 02/14/2024 Prepared by: Burnard  Exercises - Seated Hamstring Stretch  - 2 x daily - 7 x weekly - 1 sets - 3 reps - 20 hold - Sit to Stand Without Arm Support  - 2 x daily - 7 x weekly - 2 sets - 5-10 reps - Seated March  - 2 x daily - 7 x weekly - 3 sets - 10 reps - Seated Long Arc Quad  - 2 x daily - 7 x weekly - 2 sets - 10 reps - 5 hold  ASSESSMENT:  CLINICAL IMPRESSION: Patient is a 78 y.o. female who was seen today for physical therapy evaluation and treatment for gait and strength s/p IM rod placement on 12/10/23. She had a fall that resulted in fracture with surgery later that day.  Pt had a 12 day hospital stay and is now home with her husband.  Pt is WBAT now with rolling walker and reports minimal compliance with her HEP and has been walking outside for exercise.  Pt requires UE support with sit to stand and demonstrates hip adduction with this.  6 min walk test is 552 feet and age related norm is 1250 feet and 5x sit to stand time indicates falls risk. She is limited with performing housework due to fatigue.  Patient  will benefit from skilled PT to address the below impairments and improve overall function.   OBJECTIVE IMPAIRMENTS: Abnormal gait, decreased activity tolerance, decreased balance, decreased mobility, difficulty walking, decreased ROM, decreased strength, hypomobility, increased fascial restrictions, increased muscle spasms, impaired flexibility, and pain.   ACTIVITY LIMITATIONS: carrying, lifting, standing, squatting, stairs, transfers, dressing, hygiene/grooming, locomotion level, and caring for others  PARTICIPATION LIMITATIONS: meal prep, cleaning, laundry, driving, shopping, community activity, and church  PERSONAL FACTORS: Age, Time since onset of injury/illness/exacerbation, and 1 comorbidity: breast cancer  are also affecting patient's functional outcome.   REHAB POTENTIAL: Good  CLINICAL DECISION MAKING: Evolving/moderate complexity  EVALUATION COMPLEXITY: Moderate   GOALS: Goals reviewed with patient? Yes  SHORT TERM GOALS: Target date: 03/20/2024   Be independent in initial HEP Baseline: Goal status: INITIAL  2.  Wean from walker to cane for household distances due to increased strength  Baseline:  Goal status: INITIAL  3.  Perform sit to stand transition without UE support with neutral hips due to improved functional strength  Baseline:  Goal status: INITIAL  4.  Improve 6 min walk test to > or = to 750 feet to improve community distance  Baseline: 552 feet  Goal status: INITIAL  5.  Report > or = to 30% reduction in Rt hip pain with daily tasks  Baseline:  Goal status: INITIAL    LONG TERM GOALS: Target date: 04/24/2024  Be independent in advanced HEP Baseline:  Goal status: INITIAL  2.  Improve 5x sit to stand to < or = to 14 seconds to reduce falls risk  Baseline: 19.30 seconds with UE support  Goal status: INITIAL  3.  Improve 6 min walk test to > or = to 900 feet to improve community ambulation  Baseline: 552 feet  Goal status: INITIAL  4.   Wean from walker to cane in the community due to improved balance and safety Baseline:  Goal status: INITIAL  5.  Repot > or = to 60% reduction in Rt LE pain to improve tolerance for  housework  Baseline:  Goal status: INITIAL   PLAN:  PT FREQUENCY: 2x/week  PT DURATION: 8 weeks  PLANNED INTERVENTIONS: 97110-Therapeutic exercises, 97530- Therapeutic activity, 97112- Neuromuscular re-education, 97535- Self Care, 02859- Manual therapy, [phone]- Canalith repositioning, [id number]- Aquatic Therapy, [phone]- Electrical stimulation (unattended), 20560 (1-2 muscles), 20561 (3+ muscles)- Dry Needling, Patient/Family education, Balance training, Stair training, Taping, Joint mobilization, Scar mobilization, Vestibular training, Cryotherapy, and Moist heat  PLAN FOR NEXT SESSION: NuStep, gait with walker, work on sit to stand, review HEP, balance tasks, review scar massage  Burnard Joy, PT 02/14/24 12:25 PM   [facility] [address], [address] [place], [place] [address] Phone # [phone] Fax [phone]

## 2024-02-19 ENCOUNTER — Ambulatory Visit

## 2024-02-19 DIAGNOSIS — M6281 Muscle weakness (generalized): Secondary | ICD-10-CM | POA: Diagnosis not present

## 2024-02-19 DIAGNOSIS — M79604 Pain in right leg: Secondary | ICD-10-CM

## 2024-02-19 DIAGNOSIS — R2689 Other abnormalities of gait and mobility: Secondary | ICD-10-CM

## 2024-02-19 NOTE — Therapy (Signed)
 OUTPATIENT PHYSICAL THERAPY LOWER EXTREMITY TREATMENT   Patient Name: Marie Day MRN: [id number] DOB:10/04/1945, 78 y.o., female Today's Date: 02/19/2024  END OF SESSION:  PT End of Session - 02/19/24 1415     Visit Number 2    Number of Visits 9    Date for Recertification  04/10/24    Authorization Type [employer]-no auth required    Progress Note Due on Visit 10    PT Start Time 1401    PT Stop Time 1452    PT Time Calculation (min) 51 min    Activity Tolerance Patient tolerated treatment well    Behavior During Therapy [facility] for tasks assessed/performed          Past Medical History:  Diagnosis Date   Arthritis    Breast cancer (HCC)    Cancer (HCC)    Past Surgical History:  Procedure Laterality Date   BREAST BIOPSY Right 01/30/2023   US  [name] BX W LOC DEV 1ST LESION IMG BX SPEC US  GUIDE 01/30/2023 [facility] MAMMOGRAPHY   BREAST RECONSTRUCTION WITH PLACEMENT OF TISSUE EXPANDER AND FLEX HD (ACELLULAR HYDRATED DERMIS) Right 06/29/2021   Procedure: RIGHT BREAST RECONSTRUCTION WITH PLACEMENT OF TISSUE EXPANDER AND FLEX HD (ACELLULAR HYDRATED DERMIS);  Surgeon: Elisabeth Craig RAMAN, MD;  Location: [facility];  Service: Plastics;  Laterality: Right;   BREAST REDUCTION WITH MASTOPEXY Left 06/30/2022   Procedure: LEFT BREAST REDUCTION WITH MASTOPEXY;  Surgeon: Lowery Estefana RAMAN, DO;  Location: [facility];  Service: Plastics;  Laterality: Left;   INTRAMEDULLARY (IM) NAIL INTERTROCHANTERIC Right 12/10/2023   Procedure: FIXATION, FRACTURE, INTERTROCHANTERIC, WITH INTRAMEDULLARY ROD;  Surgeon: Georgina Ozell LABOR, MD;  Location: [facility];  Service: Orthopedics;  Laterality: Right;   MASTECTOMY Right 06/29/2021   MASTECTOMY W/ SENTINEL NODE BIOPSY Right 06/29/2021   Procedure: RIGHT MASTECTOMY WITH SENTINEL LYMPH NODE BIOPSY;  Surgeon: Vanderbilt Ned, MD;  Location: [facility];  Service: General;  Laterality: Right;   PORT-A-CATH REMOVAL Left  06/30/2022   Procedure: REMOVAL PORT-A-CATH;  Surgeon: Lowery Estefana RAMAN, DO;  Location: [facility];  Service: Plastics;  Laterality: Left;   REMOVAL OF TISSUE EXPANDER AND PLACEMENT OF IMPLANT Right 06/30/2022   Procedure: REMOVAL OF TISSUE EXPANDER AND PLACEMENT OF IMPLANT;  Surgeon: Lowery Estefana RAMAN, DO;  Location: [facility];  Service: Plastics;  Laterality: Right;   WISDOM TOOTH EXTRACTION     Patient Active Problem List   Diagnosis Date Noted   Osteoporosis 01/01/2024   Elevated antinuclear antibody (ANA) level 09/27/2023   Insomnia 09/18/2023   Dyslipidemia 03/24/2023   Body mass index (BMI) 24.0-24.9, adult 02/28/2023   Joint swelling 02/28/2023   Other specified abnormal immunological findings in serum 02/28/2023   Pain in limb 02/28/2023   Tinnitus 12/21/2022   Breast cancer (HCC) 12/21/2022   Joint stiffness 09/01/2022   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   Polyarthralgia 02/08/2022   S/P breast reconstruction 06/29/2021   Malignant neoplasm of upper-outer quadrant of right breast in female, estrogen receptor negative (HCC) 12/21/2020   Anemia 10/27/2020   Prediabetes 10/27/2020   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   From MD note 01/25/24:  Patient states that she is doing well at this point. She is  ambulating with a walker. She is able to ambulate up and down stairs as well. She is not having any consistent pain in the hip. She is sparingly using hydrocodone  to control her pain when she does have it. She has not noticed any redness or drainage around her incisions.   PERTINENT HISTORY: Malignant neoplasm of upper-outer quadrant of right breast in female, estrogen receptor negative (HCC) 2023, IM rod placement  PAIN: 02/14/24 Are you having pain? Yes: NPRS scale: 0-6/10 Pain location: Rt hip/leg Pain description: sharp/ache Aggravating factors: daily activities, walking  Relieving factors: pain medication, ice  PRECAUTIONS: Other: breast cancer and mastectomy   RED FLAGS: None   WEIGHT BEARING RESTRICTIONS: No WBAT per MD   FALLS:  Has patient fallen in last 6 months? Yes. Number of falls 1 on 12/10/23 with IM rod placement on Rt  LIVING ENVIRONMENT: Lives with: lives with their family Lives in: House/apartment Stairs: Yes: Internal: 4 steps; on right going up Has following equipment at home: [name] - 2 wheeled  PLOF: Independent and Leisure: none  PATIENT GOALS: improve mobility, walk without walker  NEXT MD VISIT: 03/31/24  OBJECTIVE:  Note: Objective measures were completed at Evaluation unless otherwise noted.   COGNITION: Overall cognitive status: Within functional limits for tasks assessed     SENSATION: WFL   POSTURE: rounded shoulders, forward head, and weight shift left  PALPATION: Tender over incisions in the Rt anterior and lateral thigh  LOWER EXTREMITY ROM: WFLs   LOWER EXTREMITY MMT: Rt hip 4-/5, Lt 4+/5, bil knees 4+/5   FUNCTIONAL TESTS:  02/14/24 5 times sit to stand:with UE support 19.30 seconds  6 minute walk test: 552 feet with walker with 4/10 RPE -age related norm is 1250 feet   GAIT: Distance walked: 50 Assistive device utilized: Environmental consultant - 2 wheeled Level of assistance: Modified independence Comments: slow mobility, reduced step  length                                                                                                                             TREATMENT DATE:  02/19/24: Neuro Muscular Re Ed In // bars for following: Walking focusing on less UE support than she uses with the walker, front and retro x 2 laps each Heel-toe front and retro x 2 laps each focusing on controlled gait Slow and controlled high knee marching focusing on brief SLS as tolerated x 2 laps Bil sidestepping x 2 laps focusing on keeping toes pointing forward/decreasing hip er Therapeutic Exercises NuStep, level 3 x 10 mins, UE 8/LE 9; 498 steps LAQ with 2# on  each ankle x 5 sec holds, x 10 Therapeutic Activities Sit to Stand too challenging from mat table so sat on purple airex x 5 reps slowly focusing on technique, rest break then 5 reps at a more consistent pace; then trial again of sit-stand from mat table and on third attempt of rocking pt able to perform 5 consecutive sit-stand Seated on balance disc with 2# on each ankle for alt marching x 25 focusing on core engagement for stability and no UE support Gait Training  Briefly at end of session practiced gait with walker x 50 ft focusing on less UE support (pushing walker instead of leaning on it) and equal step length. Pt able to show great improvement after cuing for this.    02/14/24 Findings from evaluation discussed, pt educated on plan of care, HEP initiated.    Scar massage education    PATIENT EDUCATION:  Education details: Access Code: [email or web address] Person educated: Patient Education method: Explanation, Demonstration, and Handouts Education comprehension: verbalized understanding and returned demonstration  HOME EXERCISE PROGRAM: Access Code: [email or web address] URL: [email or web address] Date: 02/14/2024 Prepared by: Burnard  Exercises - Seated Hamstring Stretch  - 2 x daily - 7 x weekly - 1 sets - 3 reps - 20 hold - Sit to Stand Without Arm Support  - 2 x daily  - 7 x weekly - 2 sets - 5-10 reps - Seated March  - 2 x daily - 7 x weekly - 3 sets - 10 reps - Seated Long Arc Quad  - 2 x daily - 7 x weekly - 2 sets - 10 reps - 5 hold  ASSESSMENT:  CLINICAL IMPRESSION: First full session of balance and bil LE endurance activities. She did not have pain with any activities and reports feeling challenged. Encouraged pt to work on being more consistent with her HEP as she reports she does her exercises some. She verbalized understanding and agreed. Also encouraged her to keep working on less support from UE's when using walker so she is more reliant on her legs for stability.   OBJECTIVE IMPAIRMENTS: Abnormal gait, decreased activity tolerance, decreased balance, decreased mobility, difficulty walking, decreased ROM, decreased strength, hypomobility, increased fascial restrictions, increased muscle spasms, impaired flexibility, and pain.   ACTIVITY LIMITATIONS: carrying, lifting, standing, squatting, stairs, transfers, dressing, hygiene/grooming, locomotion level, and caring for others  PARTICIPATION LIMITATIONS: meal prep, cleaning, laundry, driving, shopping, community activity, and church  PERSONAL FACTORS: Age, Time since onset of injury/illness/exacerbation, and 1 comorbidity: breast cancer  are also affecting patient's functional outcome.   REHAB POTENTIAL: Good  CLINICAL DECISION MAKING: Evolving/moderate complexity  EVALUATION COMPLEXITY: Moderate   GOALS: Goals reviewed with patient? Yes  SHORT TERM GOALS: Target date: 03/20/2024   Be independent in initial HEP Baseline: Goal status: INITIAL  2.  Wean from walker to cane for household distances due to increased strength  Baseline:  Goal status: INITIAL  3.  Perform sit to stand transition without UE support with neutral hips due to improved functional strength  Baseline:  Goal status: INITIAL  4.  Improve 6 min walk test to > or = to 750 feet to improve community distance   Baseline: 552 feet  Goal status: INITIAL  5.  Report > or = to 30% reduction in Rt hip pain with daily tasks  Baseline:  Goal status: INITIAL    LONG TERM GOALS: Target date: 04/24/2024  Be independent in advanced HEP Baseline:  Goal status: INITIAL  2.  Improve 5x sit to stand  to < or = to 14 seconds to reduce falls risk  Baseline: 19.30 seconds with UE support  Goal status: INITIAL  3.  Improve 6 min walk test to > or = to 900 feet to improve community ambulation  Baseline: 552 feet  Goal status: INITIAL  4.  Wean from walker to cane in the community due to improved balance and safety Baseline:  Goal status: INITIAL  5.  Repot > or = to 60% reduction in Rt LE pain to improve tolerance for housework  Baseline:  Goal status: INITIAL   PLAN:  PT FREQUENCY: 2x/week  PT DURATION: 8 weeks  PLANNED INTERVENTIONS: 97110-Therapeutic exercises, 97530- Therapeutic activity, 97112- Neuromuscular re-education, 97535- Self Care, 02859- Manual therapy, [phone]- Canalith repositioning, [id number]- Aquatic Therapy, [id number]- Electrical stimulation (unattended), 20560 (1-2 muscles), 20561 (3+ muscles)- Dry Needling, Patient/Family education, Balance training, Stair training, Taping, Joint mobilization, Scar mobilization, Vestibular training, Cryotherapy, and Moist heat  PLAN FOR NEXT SESSION: Cont NuStep, gait with walker, work on sit to stand, review HEP, balance tasks, review scar massage  Berwyn Knights, PTA 02/19/24 3:21 PM  [facility] [address] [place], [place] [address] Phone # [phone] Fax [phone]

## 2024-02-21 ENCOUNTER — Ambulatory Visit: Attending: Orthopedic Surgery

## 2024-02-21 DIAGNOSIS — R2689 Other abnormalities of gait and mobility: Secondary | ICD-10-CM | POA: Insufficient documentation

## 2024-02-21 DIAGNOSIS — R293 Abnormal posture: Secondary | ICD-10-CM | POA: Diagnosis present

## 2024-02-21 DIAGNOSIS — M6281 Muscle weakness (generalized): Secondary | ICD-10-CM | POA: Diagnosis present

## 2024-02-21 DIAGNOSIS — M79604 Pain in right leg: Secondary | ICD-10-CM | POA: Insufficient documentation

## 2024-02-21 NOTE — Therapy (Signed)
 OUTPATIENT PHYSICAL THERAPY LOWER EXTREMITY TREATMENT   Patient Name: Marie Day MRN: [id number] DOB:1945/07/17, 78 y.o., female Today's Date: 02/21/2024  END OF SESSION:  PT End of Session - 02/21/24 1406     Visit Number 3    Number of Visits 9    Date for Recertification  04/10/24    Authorization Type [employer]-no auth required    PT Start Time 1400    PT Stop Time 1456    PT Time Calculation (min) 56 min    Activity Tolerance Patient tolerated treatment well    Behavior During Therapy [facility] for tasks assessed/performed          Past Medical History:  Diagnosis Date   Arthritis    Breast cancer (HCC)    Cancer (HCC)    Past Surgical History:  Procedure Laterality Date   BREAST BIOPSY Right 01/30/2023   US  [name] BX W LOC DEV 1ST LESION IMG BX SPEC US  GUIDE 01/30/2023 [facility]   BREAST RECONSTRUCTION WITH PLACEMENT OF TISSUE EXPANDER AND FLEX HD (ACELLULAR HYDRATED DERMIS) Right 06/29/2021   Procedure: RIGHT BREAST RECONSTRUCTION WITH PLACEMENT OF TISSUE EXPANDER AND FLEX HD (ACELLULAR HYDRATED DERMIS);  Surgeon: Elisabeth Craig RAMAN, MD;  Location: [facility];  Service: Plastics;  Laterality: Right;   BREAST REDUCTION WITH MASTOPEXY Left 06/30/2022   Procedure: LEFT BREAST REDUCTION WITH MASTOPEXY;  Surgeon: Lowery Estefana RAMAN, DO;  Location: [facility];  Service: Plastics;  Laterality: Left;   INTRAMEDULLARY (IM) NAIL INTERTROCHANTERIC Right 12/10/2023   Procedure: FIXATION, FRACTURE, INTERTROCHANTERIC, WITH INTRAMEDULLARY ROD;  Surgeon: Georgina Ozell LABOR, MD;  Location: [facility];  Service: Orthopedics;  Laterality: Right;   MASTECTOMY Right 06/29/2021   MASTECTOMY W/ SENTINEL NODE BIOPSY Right 06/29/2021   Procedure: RIGHT MASTECTOMY WITH SENTINEL LYMPH NODE BIOPSY;  Surgeon: Vanderbilt Ned, MD;  Location: [facility];  Service: General;  Laterality: Right;   PORT-A-CATH REMOVAL Left 06/30/2022   Procedure: REMOVAL  PORT-A-CATH;  Surgeon: Lowery Estefana RAMAN, DO;  Location: [facility];  Service: Plastics;  Laterality: Left;   REMOVAL OF TISSUE EXPANDER AND PLACEMENT OF IMPLANT Right 06/30/2022   Procedure: REMOVAL OF TISSUE EXPANDER AND PLACEMENT OF IMPLANT;  Surgeon: Lowery Estefana RAMAN, DO;  Location: [facility];  Service: Plastics;  Laterality: Right;   WISDOM TOOTH EXTRACTION     Patient Active Problem List   Diagnosis Date Noted   Osteoporosis 01/01/2024   Elevated antinuclear antibody (ANA) level 09/27/2023   Insomnia 09/18/2023   Dyslipidemia 03/24/2023   Body mass index (BMI) 24.0-24.9, adult 02/28/2023   Joint swelling 02/28/2023   Other specified abnormal immunological findings in serum 02/28/2023   Pain in limb 02/28/2023   Tinnitus 12/21/2022   Breast cancer (HCC) 12/21/2022   Joint stiffness 09/01/2022   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   Polyarthralgia 02/08/2022   S/P breast reconstruction 06/29/2021   Malignant neoplasm of upper-outer quadrant of right breast in female, estrogen receptor negative (HCC) 12/21/2020   Anemia 10/27/2020   Prediabetes 10/27/2020   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   Unspecified age-related cataract 12/14/2015    PCP: Kennyth Bart, MD  REFERRING PROVIDER: Georgina Ozell, MD  REFERRING DIAG:  Diagnosis  S72.141G (ICD-10-CM) - Displaced intertrochanteric fracture of right femur, subsequent encounter for closed fracture with delayed healing    THERAPY DIAG:  Muscle weakness (generalized)  Other abnormalities of gait and mobility  Pain in right leg  Rationale for Evaluation and Treatment: Rehabilitation  ONSET DATE: 12/10/23- fall with femur fracture   SUBJECTIVE:   SUBJECTIVE STATEMENT: I felt a lot safer walking after our last session. It helped that you pointed out I was death gripping the walker so just learning to push it has really helped me.  From MD note  01/25/24:  Patient states that she is doing well at this point. She is ambulating with a walker. She is able to ambulate up and down stairs as well. She is not having any consistent pain in the hip. She is sparingly using hydrocodone  to control her pain when she does have it. She has not noticed any redness or drainage around her incisions.   PERTINENT HISTORY: Malignant neoplasm of upper-outer quadrant of right breast in female, estrogen receptor negative (HCC) 2023, IM rod placement  PAIN: 02/21/24 Are you having pain? Yes:  NPRS scale: 3/10 Pain location: Rt hip/leg Pain description: ache Aggravating factors: daily activities, walking  Relieving factors: pain medication, ice  PRECAUTIONS: Other: breast cancer and mastectomy   RED FLAGS: None   WEIGHT BEARING RESTRICTIONS: No WBAT per MD   FALLS:  Has patient fallen in last 6 months? Yes. Number of falls 1 on 12/10/23 with IM rod placement on Rt  LIVING ENVIRONMENT: Lives with: lives with their family Lives in: House/apartment Stairs: Yes: Internal: 4 steps; on right going up Has following equipment at home: [name] - 2 wheeled  PLOF: Independent and Leisure: none  PATIENT GOALS: improve mobility, walk without walker  NEXT MD VISIT: 03/31/24  OBJECTIVE:  Note: Objective measures were completed at Evaluation unless otherwise noted.   COGNITION: Overall cognitive status: Within functional limits for tasks assessed     SENSATION: WFL   POSTURE: rounded shoulders, forward head, and weight shift left  PALPATION: Tender over incisions in the Rt anterior and lateral thigh  LOWER EXTREMITY ROM: WFLs   LOWER EXTREMITY MMT: Rt hip 4-/5, Lt 4+/5, bil knees 4+/5   FUNCTIONAL TESTS:  02/14/24 5 times sit to stand:with UE support 19.30 seconds  6 minute walk test: 552 feet with walker with 4/10 RPE -age related norm is 1250 feet   GAIT: Distance walked: 50 Assistive device utilized: Environmental consultant - 2 wheeled Level of  assistance: Modified independence Comments: slow mobility, reduced step length                                                                                                                             TREATMENT DATE:  02/21/24: Therapeutic Exercises NuStep x 10 mins total, level 4 x 3 mins, Level 5 x 7 mins; UE 8/LE 9; 470 steps LAQ with 2# on each ankle x 5 sec holds, x 10 Bil leg press 40# x 10, then 50# x 10 with VC's to decrease valgus during concentric motion Therapeutic Activities Stairs up/down alternating x 4 working on decreasing UE support Sit to Stand from mat table 2 x 5 able  to do without airex today and first try Seated on balance disc with 2# on each ankle for alt marching x 25 focusing on core engagement for stability and no UE support Standing with walker for front weight shift and then Rt side weight shift x 1 min each Neuro Muscular Re Ed In // bars for following: Walking focusing on less UE support than she uses with the walker, front and retro x 2 laps each Heel-toe front and retro x 2 laps each focusing on controlled gait Slow and controlled high knee marching focusing on 3 sec SLS as tolerated x 2 laps Bil sidestepping with mini squat x 2 laps  02/19/24: Neuro Muscular Re Ed In // bars for following: Walking focusing on less UE support than she uses with the walker, front and retro x 2 laps each Heel-toe front and retro x 2 laps each focusing on controlled gait Slow and controlled high knee marching focusing on brief SLS as tolerated x 2 laps Bil sidestepping x 2 laps focusing on keeping toes pointing forward/decreasing hip er Therapeutic Exercises NuStep, level 3 x 10 mins, UE 8/LE 9; 498 steps LAQ with 2# on each ankle x 5 sec holds, x 10 Therapeutic Activities Sit to Stand too challenging from mat table so sat on purple airex x 5 reps slowly focusing on technique, rest break then 5 reps at a more consistent pace; then trial again of sit-stand from mat table  and on third attempt of rocking pt able to perform 5 consecutive sit-stand Seated on balance disc with 2# on each ankle for alt marching x 25 focusing on core engagement for stability and no UE support Gait Training  Briefly at end of session practiced gait with walker x 50 ft focusing on less UE support (pushing walker instead of leaning on it) and equal step length. Pt able to show great improvement after cuing for this.    02/14/24 Findings from evaluation discussed, pt educated on plan of care, HEP initiated.    Scar massage education    PATIENT EDUCATION:  Education details: Access Code: [email or web address] Person educated: Patient Education method: Explanation, Demonstration, and Handouts Education comprehension: verbalized understanding and returned demonstration  HOME EXERCISE PROGRAM: Access Code: [email or web address] URL: [email or web address] Date: 02/14/2024 Prepared by: Burnard  Exercises - Seated Hamstring Stretch  - 2 x daily - 7 x weekly - 1 sets - 3 reps - 20 hold - Sit to Stand Without Arm Support  - 2 x daily - 7 x weekly - 2 sets - 5-10 reps - Seated March  - 2 x daily - 7 x weekly - 3 sets - 10 reps - Seated Long Arc Quad  - 2 x daily - 7 x weekly - 2 sets - 10 reps - 5 hold  ASSESSMENT:  CLINICAL IMPRESSION: Pt comes in reporting that she felt more confidence after last session as her legs felt stronger. She also started walking short distances in her house without her walker yesterday, like in kitchen to carry a dish to the sink. Today continued with bil LE strength and neuro muscular re education. Also worked on stairs with alternating gait pattern and working to rely on UE's less so as not pulling herself up the stairs. Encouraged pt to work on weight shifting activities at home at counter as done today, she verbalized understanding.   OBJECTIVE IMPAIRMENTS: Abnormal gait, decreased activity tolerance, decreased balance, decreased mobility, difficulty walking, decreased  ROM, decreased strength, hypomobility, increased fascial restrictions, increased muscle  spasms, impaired flexibility, and pain.   ACTIVITY LIMITATIONS: carrying, lifting, standing, squatting, stairs, transfers, dressing, hygiene/grooming, locomotion level, and caring for others  PARTICIPATION LIMITATIONS: meal prep, cleaning, laundry, driving, shopping, community activity, and church  PERSONAL FACTORS: Age, Time since onset of injury/illness/exacerbation, and 1 comorbidity: breast cancer  are also affecting patient's functional outcome.   REHAB POTENTIAL: Good  CLINICAL DECISION MAKING: Evolving/moderate complexity  EVALUATION COMPLEXITY: Moderate   GOALS: Goals reviewed with patient? Yes  SHORT TERM GOALS: Target date: 03/20/2024   Be independent in initial HEP Baseline: Goal status: INITIAL  2.  Wean from walker to cane for household distances due to increased strength  Baseline:  Goal status: INITIAL  3.  Perform sit to stand transition without UE support with neutral hips due to improved functional strength  Baseline:  Goal status: INITIAL  4.  Improve 6 min walk test to > or = to 750 feet to improve community distance  Baseline: 552 feet  Goal status: INITIAL  5.  Report > or = to 30% reduction in Rt hip pain with daily tasks  Baseline:  Goal status: INITIAL    LONG TERM GOALS: Target date: 04/24/2024  Be independent in advanced HEP Baseline:  Goal status: INITIAL  2.  Improve 5x sit to stand to < or = to 14 seconds to reduce falls risk  Baseline: 19.30 seconds with UE support  Goal status: INITIAL  3.  Improve 6 min walk test to > or = to 900 feet to improve community ambulation  Baseline: 552 feet  Goal status: INITIAL  4.  Wean from walker to cane in the community due to improved balance and safety Baseline:  Goal status: INITIAL  5.  Repot > or = to 60% reduction in Rt LE pain to improve tolerance for housework  Baseline:  Goal status:  INITIAL   PLAN:  PT FREQUENCY: 2x/week  PT DURATION: 8 weeks  PLANNED INTERVENTIONS: 97110-Therapeutic exercises, 97530- Therapeutic activity, 97112- Neuromuscular re-education, 97535- Self Care, 02859- Manual therapy, [phone]- Canalith repositioning, [id number]- Aquatic Therapy, [id number]- Electrical stimulation (unattended), 20560 (1-2 muscles), 20561 (3+ muscles)- Dry Needling, Patient/Family education, Balance training, Stair training, Taping, Joint mobilization, Scar mobilization, Vestibular training, Cryotherapy, and Moist heat  PLAN FOR NEXT SESSION: Progress HEP to include new exs done in gym; Cont NuStep, gait with walker, work on sit to stand, review HEP, balance tasks, review scar massage  Berwyn Knights, PTA 02/21/24 3:02 PM  [facility] [address] [place], [place] [address] Phone # [phone] Fax [phone]

## 2024-02-26 ENCOUNTER — Ambulatory Visit

## 2024-02-26 DIAGNOSIS — M6281 Muscle weakness (generalized): Secondary | ICD-10-CM

## 2024-02-26 DIAGNOSIS — M79604 Pain in right leg: Secondary | ICD-10-CM

## 2024-02-26 DIAGNOSIS — R2689 Other abnormalities of gait and mobility: Secondary | ICD-10-CM

## 2024-02-26 MED ORDER — TRAMADOL HCL 50 MG PO TABS: 50.0000 mg | ORAL_TABLET | Freq: Four times a day (QID) | ORAL | 0 refills | Status: AC | PRN

## 2024-02-26 NOTE — Therapy (Signed)
 OUTPATIENT PHYSICAL THERAPY LOWER EXTREMITY TREATMENT   Patient Name: Marie Day MRN: [id number] DOB:[birth year], 78 y.o., female Today's Date: 02/26/2024  END OF SESSION:  PT End of Session - 02/26/24 1207     Visit Number 4    Number of Visits 9    Date for Recertification  04/10/24    Authorization Type [employer]-no auth required    Progress Note Due on Visit 10    PT Start Time 1203    PT Stop Time 1253    PT Time Calculation (min) 50 min    Activity Tolerance Patient tolerated treatment well    Behavior During Therapy [facility] for tasks assessed/performed          Past Medical History:  Diagnosis Date   Arthritis    Breast cancer (HCC)    Cancer (HCC)    Past Surgical History:  Procedure Laterality Date   BREAST BIOPSY Right 01/30/2023   US  [name] BX W LOC DEV 1ST LESION IMG BX SPEC US  GUIDE 01/30/2023 [facility]AMMO[facility]   BREAST RECONSTRUCTION WITH PLACEMENT OF TISSUE EXPANDER AND FLEX HD (ACELLULAR HYDRATED DERMIS) Right 06/29/2021   Procedure: RIGHT BREAST RECONSTRUCTION WITH PLACEMENT OF TISSUE EXPANDER AND FLEX HD (ACELLULAR HYDRATED DERMIS);  Surgeon: Elisabeth Craig RAMAN, MD;  Location: [facility];  Service: Plastics;  Laterality: Right;   BREAST REDUCTION WITH MASTOPEXY Left 06/30/2022   Procedure: LEFT BREAST REDUCTION WITH MASTOPEXY;  Surgeon: Lowery Estefana RAMAN, DO;  Location: [facility];  Service: Plastics;  Laterality: Left;   INTRAMEDULLARY (IM) NAIL INTERTROCHANTERIC Right 12/10/2023   Procedure: FIXATION, FRACTURE, INTERTROCHANTERIC, WITH INTRAMEDULLARY ROD;  Surgeon: Georgina Ozell LABOR, MD;  Location: [facility];  Service: Orthopedics;  Laterality: Right;   MASTECTOMY Right 06/29/2021   MASTECTOMY W/ SENTINEL NODE BIOPSY Right 06/29/2021   Procedure: RIGHT MASTECTOMY WITH SENTINEL LYMPH NODE BIOPSY;  Surgeon: Vanderbilt Ned, MD;  Location: [facility];  Service: General;  Laterality: Right;   PORT-A-CATH REMOVAL Left  06/30/2022   Procedure: REMOVAL PORT-A-CATH;  Surgeon: Lowery Estefana RAMAN, DO;  Location: [facility];  Service: Plastics;  Laterality: Left;   REMOVAL OF TISSUE EXPANDER AND PLACEMENT OF IMPLANT Right 06/30/2022   Procedure: REMOVAL OF TISSUE EXPANDER AND PLACEMENT OF IMPLANT;  Surgeon: Lowery Estefana RAMAN, DO;  Location: [facility];  Service: Plastics;  Laterality: Right;   WISDOM TOOTH EXTRACTION     Patient Active Problem List   Diagnosis Date Noted   Osteoporosis 01/01/2024   Elevated antinuclear antibody (ANA) level 09/27/2023   Insomnia 09/18/2023   Dyslipidemia 03/24/2023   Body mass index (BMI) 24.0-24.9, adult 02/28/2023   Joint swelling 02/28/2023   Other specified abnormal immunological findings in serum 02/28/2023   Pain in limb 02/28/2023   Tinnitus 12/21/2022   Breast cancer (HCC) 12/21/2022   Joint stiffness 09/01/2022   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   Polyarthralgia 02/08/2022   S/P breast reconstruction 06/29/2021   Malignant neoplasm of upper-outer quadrant of right breast in female, estrogen receptor negative (HCC) 12/21/2020   Anemia 10/27/2020   Prediabetes 10/27/2020   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   Unspecified age-related cataract 12/14/2015    PCP: Kennyth Bart, MD  REFERRING PROVIDER: Georgina Ozell, MD  REFERRING DIAG:  Diagnosis  S72.141G (ICD-10-CM) - Displaced intertrochanteric fracture of right femur, subsequent encounter for closed fracture with delayed healing    THERAPY DIAG:  Muscle weakness (generalized)  Other abnormalities of  gait and mobility  Pain in right leg  Rationale for Evaluation and Treatment: Rehabilitation  ONSET DATE: 12/10/23- fall with femur fracture   SUBJECTIVE:   SUBJECTIVE STATEMENT: I'm using my walker less and less at home and when my hip isn't hurting my gait is getting straighter. I am going to have to call my surgeon about  more pain meds because it's been waking me up at night pretty consistently.   From MD note 01/25/24:  Patient states that she is doing well at this point. She is ambulating with a walker. She is able to ambulate up and down stairs as well. She is not having any consistent pain in the hip. She is sparingly using hydrocodone  to control her pain when she does have it. She has not noticed any redness or drainage around her incisions.   PERTINENT HISTORY: Malignant neoplasm of upper-outer quadrant of right breast in female, estrogen receptor negative (HCC) 2023, IM rod placement  PAIN: 02/21/24 Are you having pain? No, not currently because I'm moving it  PRECAUTIONS: Other: breast cancer and mastectomy   RED FLAGS: None   WEIGHT BEARING RESTRICTIONS: No WBAT per MD   FALLS:  Has patient fallen in last 6 months? Yes. Number of falls 1 on 12/10/23 with IM rod placement on Rt  LIVING ENVIRONMENT: Lives with: lives with their family Lives in: House/apartment Stairs: Yes: Internal: 4 steps; on right going up Has following equipment at home: [name] - 2 wheeled  PLOF: Independent and Leisure: none  PATIENT GOALS: improve mobility, walk without walker  NEXT MD VISIT: 03/31/24  OBJECTIVE:  Note: Objective measures were completed at Evaluation unless otherwise noted.   COGNITION: Overall cognitive status: Within functional limits for tasks assessed     SENSATION: WFL   POSTURE: rounded shoulders, forward head, and weight shift left  PALPATION: Tender over incisions in the Rt anterior and lateral thigh  LOWER EXTREMITY ROM: WFLs   LOWER EXTREMITY MMT: Rt hip 4-/5, Lt 4+/5, bil knees 4+/5   FUNCTIONAL TESTS:  02/14/24 5 times sit to stand:with UE support 19.30 seconds  6 minute walk test: 552 feet with walker with 4/10 RPE -age related norm is 1250 feet   GAIT: Distance walked: 50 Assistive device utilized: Environmental consultant - 2 wheeled Level of assistance: Modified  independence Comments: slow mobility, reduced step length                                                                                                                             TREATMENT DATE:  02/26/24: Therapeutic Exercises NuStep x 12 mins total, level 4 x 3 mins, Level 5 x 7 mins; UE 8/LE 9; 591 steps End of session for bil hip flexor stretch in // bars in runners lunge x 2 reps each Seated EOB for Rt HS stretch x 1 rep x 30 sec holds Therapeutic Activities In // bars with red theraband around Rt ankle for bil hip 3 way  raises into flex, ext and abd 2 x 10 each returning therapist demo for each 6 front step up with 3 sec SLS on step with each rep, 2 x 10 returning therapist demo Supine bridges x 10 encouraging full hip ext as much as able Neuro Muscular Re Ed  Heel-toe front and retro x 3 laps each focusing on foot placement and not looking down, then same on Airex beam 4 laps each with encouragement to rely on UE's as little as possible Self Care Spent time discussing new HEP once printed and advised her how to incorporate the different exs into her day at different times. (Bridges in morning before getting out of bed and night before going to bed, then can do standing exs during kitchen or bathroom ADLs)  02/21/24: Therapeutic Exercises NuStep x 10 mins total, level 4 x 3 mins, Level 5 x 7 mins; UE 8/LE 9; 470 steps LAQ with 2# on each ankle x 5 sec holds, x 10 Bil leg press 40# x 10, then 50# x 10 with VC's to decrease valgus during concentric motion Therapeutic Activities Stairs up/down alternating x 4 working on decreasing UE support Sit to Stand from mat table 2 x 5 able to do without airex today and first try Seated on balance disc with 2# on each ankle for alt marching x 25 focusing on core engagement for stability and no UE support Standing with walker for front weight shift and then Rt side weight shift x 1 min each Neuro Muscular Re Ed In // bars for  following: Walking focusing on less UE support than she uses with the walker, front and retro x 2 laps each Heel-toe front and retro x 2 laps each focusing on controlled gait Slow and controlled high knee marching focusing on 3 sec SLS as tolerated x 2 laps Bil sidestepping with mini squat x 2 laps  02/19/24: Neuro Muscular Re Ed In // bars for following: Walking focusing on less UE support than she uses with the walker, front and retro x 2 laps each Heel-toe front and retro x 2 laps each focusing on controlled gait Slow and controlled high knee marching focusing on brief SLS as tolerated x 2 laps Bil sidestepping x 2 laps focusing on keeping toes pointing forward/decreasing hip er Therapeutic Exercises NuStep, level 3 x 10 mins, UE 8/LE 9; 498 steps LAQ with 2# on each ankle x 5 sec holds, x 10 Therapeutic Activities Sit to Stand too challenging from mat table so sat on purple airex x 5 reps slowly focusing on technique, rest break then 5 reps at a more consistent pace; then trial again of sit-stand from mat table and on third attempt of rocking pt able to perform 5 consecutive sit-stand Seated on balance disc with 2# on each ankle for alt marching x 25 focusing on core engagement for stability and no UE support Gait Training  Briefly at end of session practiced gait with walker x 50 ft focusing on less UE support (pushing walker instead of leaning on it) and equal step length. Pt able to show great improvement after cuing for this.       PATIENT EDUCATION:  Education details: Access Code: [email or web address] Person educated: Patient Education method: Explanation, Demonstration, and Handouts Education comprehension: verbalized understanding and returned demonstration  HOME EXERCISE PROGRAM: Access Code: [email or web address] (use new access code) URL: [email or web address] Date: 02/14/2024 Prepared by: Burnard  Exercises - Seated Hamstring Stretch  - 2 x daily - 7 x weekly -  1 sets - 3 reps  - 20 hold - Sit to Stand Without Arm Support  - 2 x daily - 7 x weekly - 2 sets - 5-10 reps - Seated March  - 2 x daily - 7 x weekly - 3 sets - 10 reps - Seated Long Arc Quad  - 2 x daily - 7 x weekly - 2 sets - 10 reps - 5 hold  Access Code: [email or web address] URL: [email or web address] Date: 02/26/2024 Prepared by: Berwyn Knights  Exercises - Standing Hip Flexion with Resistance (Mirrored)  - 1 x daily - 7 x weekly - 1 sets - 10 reps - 3 hold - Standing Hip Extension with Resistance  - 1 x daily - 7 x weekly - 1 sets - 10 reps - 3 hold - Standing Hip Abduction with Theraband Resistance  - 1 x daily - 7 x weekly - 1 sets - 10 reps - 3 hold - Supine Bridge  - 1 x daily - 7 x weekly - 2 sets - 10 reps - 5 hold - Seated Long Arc Quad with Ankle Weight  - 1 x daily - 7 x weekly - 2 sets - 10 reps - 5 hold - Seated March  - 2 x daily - 7 x weekly - 3 sets - 10 reps - Sit to Stand Without Arm Support  - 2 x daily - 7 x weekly - 2 sets - 5-10 reps - Standing Tandem Balance with Counter Support  - 1 x daily - 7 x weekly - 5 reps - 30-60 hold - Standing Single Leg Stance with Counter Support  - 1 x daily - 7 x weekly - 1 sets - 5 reps - 30-60 hold - Standing Heel Raise with Support  - 1 x daily - 7 x weekly - 3 sets - 10 reps - 3 hold - Runner's Climb  - 2 x daily - 7 x weekly - 2-3 sets - 10 reps - 3-5 hold - Seated Hamstring Stretch  - 1 x daily - 7 x weekly - 1 sets - 3 reps - 20 hold - Standing Hip Flexor Stretch  - 2 x daily - 7 x weekly - 1 sets - 3 reps - 20 hold  ASSESSMENT: CLINICAL IMPRESSION: Pt reports feeling confidence slowly improving with gait as she is using walker less and less at home and feels at times she is able to walk straighter. Today continued with Rt LE strength and stability and progressed HEP to reflect todays activities. Educated pt about how she can sprinkle these exercises into different times of her day so as not feeling as she has to do all of them at  once causing increased fatigue. Pt verbalized understanding.   OBJECTIVE IMPAIRMENTS: Abnormal gait, decreased activity tolerance, decreased balance, decreased mobility, difficulty walking, decreased ROM, decreased strength, hypomobility, increased fascial restrictions, increased muscle spasms, impaired flexibility, and pain.   ACTIVITY LIMITATIONS: carrying, lifting, standing, squatting, stairs, transfers, dressing, hygiene/grooming, locomotion level, and caring for others  PARTICIPATION LIMITATIONS: meal prep, cleaning, laundry, driving, shopping, community activity, and church  PERSONAL FACTORS: Age, Time since onset of injury/illness/exacerbation, and 1 comorbidity: breast cancer  are also affecting patient's functional outcome.   REHAB POTENTIAL: Good  CLINICAL DECISION MAKING: Evolving/moderate complexity  EVALUATION COMPLEXITY: Moderate   GOALS: Goals reviewed with patient? Yes  SHORT TERM GOALS: Target date: 03/20/2024   Be independent in initial HEP Baseline: Goal status: INITIAL  2.  Wean from walker to cane for  household distances due to increased strength  Baseline:  Goal status: INITIAL  3.  Perform sit to stand transition without UE support with neutral hips due to improved functional strength  Baseline:  Goal status: INITIAL  4.  Improve 6 min walk test to > or = to 750 feet to improve community distance  Baseline: 552 feet  Goal status: INITIAL  5.  Report > or = to 30% reduction in Rt hip pain with daily tasks  Baseline:  Goal status: INITIAL    LONG TERM GOALS: Target date: 04/24/2024  Be independent in advanced HEP Baseline:  Goal status: INITIAL  2.  Improve 5x sit to stand to < or = to 14 seconds to reduce falls risk  Baseline: 19.30 seconds with UE support  Goal status: INITIAL  3.  Improve 6 min walk test to > or = to 900 feet to improve community ambulation  Baseline: 552 feet  Goal status: INITIAL  4.  Wean from walker to cane in the  community due to improved balance and safety Baseline:  Goal status: INITIAL  5.  Repot > or = to 60% reduction in Rt LE pain to improve tolerance for housework  Baseline:  Goal status: INITIAL   PLAN:  PT FREQUENCY: 2x/week  PT DURATION: 8 weeks  PLANNED INTERVENTIONS: 97110-Therapeutic exercises, 97530- Therapeutic activity, 97112- Neuromuscular re-education, 97535- Self Care, 02859- Manual therapy, [id number]- Canalith repositioning, [id number]- Aquatic Therapy, [id number]- Electrical stimulation (unattended), 20560 (1-2 muscles), 20561 (3+ muscles)- Dry Needling, Patient/Family education, Balance training, Stair training, Taping, Joint mobilization, Scar mobilization, Vestibular training, Cryotherapy, and Moist heat  PLAN FOR NEXT SESSION: Review and progress HEP prn; Cont NuStep, gait with walker, work on sit to stand, review HEP, balance tasks, review scar massage  Berwyn Knights, PTA 02/26/24 1:10 PM  [facility] [address] [place], [place] [address] Phone # [phone] Fax [phone]

## 2024-02-26 NOTE — Patient Instructions (Signed)
 Marie Day

## 2024-02-26 NOTE — Addendum Note (Signed)
 Addended by: GEORGINA SHARPER on: 02/26/2024 08:27 PM   Modules accepted: Orders

## 2024-02-28 ENCOUNTER — Ambulatory Visit

## 2024-02-28 DIAGNOSIS — M79604 Pain in right leg: Secondary | ICD-10-CM

## 2024-02-28 DIAGNOSIS — M6281 Muscle weakness (generalized): Secondary | ICD-10-CM | POA: Diagnosis not present

## 2024-02-28 DIAGNOSIS — R2689 Other abnormalities of gait and mobility: Secondary | ICD-10-CM

## 2024-02-28 NOTE — Therapy (Signed)
 OUTPATIENT PHYSICAL THERAPY LOWER EXTREMITY TREATMENT   Patient Name: Marie Day MRN: [id number] DOB:Mar 05, 1946, 78 y.o., female Today's Date: 02/28/2024  END OF SESSION:  PT End of Session - 02/28/24 1205     Visit Number 5    Number of Visits 9    Date for Recertification  04/10/24    Authorization Type [employer]-no auth required    Progress Note Due on Visit 10    PT Start Time 1202    PT Stop Time 1255    PT Time Calculation (min) 53 min    Activity Tolerance Patient tolerated treatment well    Behavior During Therapy [facility] for tasks assessed/performed          Past Medical History:  Diagnosis Date   Arthritis    Breast cancer (HCC)    Cancer (HCC)    Past Surgical History:  Procedure Laterality Date   BREAST BIOPSY Right 01/30/2023   US  [name] BX W LOC DEV 1ST LESION IMG BX SPEC US  GUIDE 01/30/2023 [facility] MAMMOGRAPHY   BREAST RECONSTRUCTION WITH PLACEMENT OF TISSUE EXPANDER AND FLEX HD (ACELLULAR HYDRATED DERMIS) Right 06/29/2021   Procedure: RIGHT BREAST RECONSTRUCTION WITH PLACEMENT OF TISSUE EXPANDER AND FLEX HD (ACELLULAR HYDRATED DERMIS);  Surgeon: Elisabeth Craig RAMAN, MD;  Location: [facility];  Service: Plastics;  Laterality: Right;   BREAST REDUCTION WITH MASTOPEXY Left 06/30/2022   Procedure: LEFT BREAST REDUCTION WITH MASTOPEXY;  Surgeon: Lowery Estefana RAMAN, DO;  Location: [facility];  Service: Plastics;  Laterality: Left;   INTRAMEDULLARY (IM) NAIL INTERTROCHANTERIC Right 12/10/2023   Procedure: FIXATION, FRACTURE, INTERTROCHANTERIC, WITH INTRAMEDULLARY ROD;  Surgeon: Georgina Ozell LABOR, MD;  Location: [facility];  Service: Orthopedics;  Laterality: Right;   MASTECTOMY Right 06/29/2021   MASTECTOMY W/ SENTINEL NODE BIOPSY Right 06/29/2021   Procedure: RIGHT MASTECTOMY WITH SENTINEL LYMPH NODE BIOPSY;  Surgeon: Vanderbilt Ned, MD;  Location: [facility];  Service: General;  Laterality: Right;   PORT-A-CATH REMOVAL Left  06/30/2022   Procedure: REMOVAL PORT-A-CATH;  Surgeon: Lowery Estefana RAMAN, DO;  Location: [facility];  Service: Plastics;  Laterality: Left;   REMOVAL OF TISSUE EXPANDER AND PLACEMENT OF IMPLANT Right 06/30/2022   Procedure: REMOVAL OF TISSUE EXPANDER AND PLACEMENT OF IMPLANT;  Surgeon: Lowery Estefana RAMAN, DO;  Location: [facility];  Service: Plastics;  Laterality: Right;   WISDOM TOOTH EXTRACTION     Patient Active Problem List   Diagnosis Date Noted   Osteoporosis 01/01/2024   Elevated antinuclear antibody (ANA) level 09/27/2023   Insomnia 09/18/2023   Dyslipidemia 03/24/2023   Body mass index (BMI) 24.0-24.9, adult 02/28/2023   Joint swelling 02/28/2023   Other specified abnormal immunological findings in serum 02/28/2023   Pain in limb 02/28/2023   Tinnitus 12/21/2022   Breast cancer (HCC) 12/21/2022   Joint stiffness 09/01/2022   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   Polyarthralgia 02/08/2022   S/P breast reconstruction 06/29/2021   Malignant neoplasm of upper-outer quadrant of right breast in female, estrogen receptor negative (HCC) 12/21/2020   Anemia 10/27/2020   Prediabetes 10/27/2020   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   Unspecified age-related cataract 12/14/2015    PCP: Kennyth Bart, MD  REFERRING PROVIDER: Georgina Ozell, MD  REFERRING DIAG:  Diagnosis  S72.141G (ICD-10-CM) - Displaced intertrochanteric fracture of right femur, subsequent encounter for closed fracture with delayed healing    THERAPY DIAG:  Muscle weakness (generalized)  Other abnormalities of  gait and mobility  Pain in right leg  Rationale for Evaluation and Treatment: Rehabilitation  ONSET DATE: 12/10/23- fall with femur fracture   SUBJECTIVE:   SUBJECTIVE STATEMENT: I did massage my scar with lotion a little this morning. I got a new order for the Tramodol for pain.  I'm trying to work the exercises into my day  more. I can tell lifting my leg into bed is getting easier. I can't quite lift my leg high enough to get my sock on but it's getting closer.   From MD note 01/25/24:  Patient states that she is doing well at this point. She is ambulating with a walker. She is able to ambulate up and down stairs as well. She is not having any consistent pain in the hip. She is sparingly using hydrocodone  to control her pain when she does have it. She has not noticed any redness or drainage around her incisions.   PERTINENT HISTORY: Malignant neoplasm of upper-outer quadrant of right breast in female, estrogen receptor negative (HCC) 2023, IM rod placement  PAIN: 02/21/24 Are you having pain? No, not currently because I'm moving it  PRECAUTIONS: Other: breast cancer and mastectomy   RED FLAGS: None   WEIGHT BEARING RESTRICTIONS: No WBAT per MD   FALLS:  Has patient fallen in last 6 months? Yes. Number of falls 1 on 12/10/23 with IM rod placement on Rt  LIVING ENVIRONMENT: Lives with: lives with their family Lives in: House/apartment Stairs: Yes: Internal: 4 steps; on right going up Has following equipment at home: [name] - 2 wheeled  PLOF: Independent and Leisure: none  PATIENT GOALS: improve mobility, walk without walker  NEXT MD VISIT: 03/31/24  OBJECTIVE:  Note: Objective measures were completed at Evaluation unless otherwise noted.   COGNITION: Overall cognitive status: Within functional limits for tasks assessed     SENSATION: WFL   POSTURE: rounded shoulders, forward head, and weight shift left  PALPATION: Tender over incisions in the Rt anterior and lateral thigh  LOWER EXTREMITY ROM: WFLs   LOWER EXTREMITY MMT: Rt hip 4-/5, Lt 4+/5, bil knees 4+/5   FUNCTIONAL TESTS:  02/14/24 5 times sit to stand:with UE support 19.30 seconds  6 minute walk test: 552 feet with walker with 4/10 RPE -age related norm is 1250 feet   GAIT: Distance walked: 50 Assistive device utilized:  Environmental consultant - 2 wheeled Level of assistance: Modified independence Comments: slow mobility, reduced step length                                                                                                                             TREATMENT DATE:  02/28/24: Therapeutic Exercises NuStep level 6 x 10 mins; UE 8/LE 9; 530 steps Hip Machine for hip 3 way raises into Rt flex, abd and then ext 25# x 10 each, then second set 15#, x 10 each returning therapist demo Bil leg press 60#, 3 x 10; pt able to return  correct demo after instruction  Neuro Re ed Standing in corner on purple airex for purple all toss x 3 mins at varying heights In // bars: 2# added to each ankle for increased challenge: Front and retro heel-toe walking 3 laps; slow, controlled marching with encouragement to lift Rt knee to hip level as able, 4  laps Stepping over orange obstacles with 2# on ankles: Front x 2 laps, then bil sidestepping x 3 laps each with fingertip support Manual Therapy Scar massage in Lt S/L to Rt lateral hip incisions with cocoa butter at end of session so pt could compare to how she is doing this at home  02/26/24: Therapeutic Exercises NuStep x 12 mins total, level 4 x 3 mins, Level 5 x 7 mins; UE 8/LE 9; 591 steps End of session for bil hip flexor stretch in // bars in runners lunge x 2 reps each Seated EOB for Rt HS stretch x 1 rep x 30 sec holds Therapeutic Activities In // bars with red theraband around Rt ankle for bil hip 3 way raises into flex, ext and abd 2 x 10 each returning therapist demo for each 6 front step up with 3 sec SLS on step with each rep, 2 x 10 returning therapist demo Supine bridges x 10 encouraging full hip ext as much as able Neuro Muscular Re Ed  Heel-toe front and retro x 3 laps each focusing on foot placement and not looking down, then same on Airex beam 4 laps each with encouragement to rely on UE's as little as possible Self Care Spent time discussing new HEP once  printed and advised her how to incorporate the different exs into her day at different times. (Bridges in morning before getting out of bed and night before going to bed, then can do standing exs during kitchen or bathroom ADLs)  02/21/24: Therapeutic Exercises NuStep x 10 mins total, level 4 x 3 mins, Level 5 x 7 mins; UE 8/LE 9; 470 steps LAQ with 2# on each ankle x 5 sec holds, x 10 Bil leg press 40# x 10, then 50# x 10 with VC's to decrease valgus during concentric motion Therapeutic Activities Stairs up/down alternating x 4 working on decreasing UE support Sit to Stand from mat table 2 x 5 able to do without airex today and first try Seated on balance disc with 2# on each ankle for alt marching x 25 focusing on core engagement for stability and no UE support Standing with walker for front weight shift and then Rt side weight shift x 1 min each Neuro Muscular Re Ed In // bars for following: Walking focusing on less UE support than she uses with the walker, front and retro x 2 laps each Heel-toe front and retro x 2 laps each focusing on controlled gait Slow and controlled high knee marching focusing on 3 sec SLS as tolerated x 2 laps Bil sidestepping with mini squat x 2 laps  02/19/24: Neuro Muscular Re Ed In // bars for following: Walking focusing on less UE support than she uses with the walker, front and retro x 2 laps each Heel-toe front and retro x 2 laps each focusing on controlled gait Slow and controlled high knee marching focusing on brief SLS as tolerated x 2 laps Bil sidestepping x 2 laps focusing on keeping toes pointing forward/decreasing hip er Therapeutic Exercises NuStep, level 3 x 10 mins, UE 8/LE 9; 498 steps LAQ with 2# on each ankle x 5 sec holds, x  10 Therapeutic Activities Sit to Stand too challenging from mat table so sat on purple airex x 5 reps slowly focusing on technique, rest break then 5 reps at a more consistent pace; then trial again of sit-stand from mat  table and on third attempt of rocking pt able to perform 5 consecutive sit-stand Seated on balance disc with 2# on each ankle for alt marching x 25 focusing on core engagement for stability and no UE support Gait Training  Briefly at end of session practiced gait with walker x 50 ft focusing on less UE support (pushing walker instead of leaning on it) and equal step length. Pt able to show great improvement after cuing for this.       PATIENT EDUCATION:  Education details: Access Code: [email or web address] Person educated: Patient Education method: Explanation, Demonstration, and Handouts Education comprehension: verbalized understanding and returned demonstration  HOME EXERCISE PROGRAM: Access Code: [email or web address] (use new access code) URL: [email or web address] Date: 02/14/2024 Prepared by: Burnard  Exercises - Seated Hamstring Stretch  - 2 x daily - 7 x weekly - 1 sets - 3 reps - 20 hold - Sit to Stand Without Arm Support  - 2 x daily - 7 x weekly - 2 sets - 5-10 reps - Seated March  - 2 x daily - 7 x weekly - 3 sets - 10 reps - Seated Long Arc Quad  - 2 x daily - 7 x weekly - 2 sets - 10 reps - 5 hold  Access Code: [email or web address] URL: [email or web address] Date: 02/26/2024 Prepared by: Berwyn Knights  Exercises - Standing Hip Flexion with Resistance (Mirrored)  - 1 x daily - 7 x weekly - 1 sets - 10 reps - 3 hold - Standing Hip Extension with Resistance  - 1 x daily - 7 x weekly - 1 sets - 10 reps - 3 hold - Standing Hip Abduction with Theraband Resistance  - 1 x daily - 7 x weekly - 1 sets - 10 reps - 3 hold - Supine Bridge  - 1 x daily - 7 x weekly - 2 sets - 10 reps - 5 hold - Seated Long Arc Quad with Ankle Weight  - 1 x daily - 7 x weekly - 2 sets - 10 reps - 5 hold - Seated March  - 2 x daily - 7 x weekly - 3 sets - 10 reps - Sit to Stand Without Arm Support  - 2 x daily - 7 x weekly - 2 sets - 5-10 reps - Standing Tandem Balance with Counter Support  - 1 x  daily - 7 x weekly - 5 reps - 30-60 hold - Standing Single Leg Stance with Counter Support  - 1 x daily - 7 x weekly - 1 sets - 5 reps - 30-60 hold - Standing Heel Raise with Support  - 1 x daily - 7 x weekly - 3 sets - 10 reps - 3 hold - Runner's Climb  - 2 x daily - 7 x weekly - 2-3 sets - 10 reps - 3-5 hold - Seated Hamstring Stretch  - 1 x daily - 7 x weekly - 1 sets - 3 reps - 20 hold - Standing Hip Flexor Stretch  - 2 x daily - 7 x weekly - 1 sets - 3 reps - 20 hold  ASSESSMENT: CLINICAL IMPRESSION: Progressed pt to using weighted resistance with machine for hip strengthening and then leg press on light weights. Pt tolerated these well reporting feeling challenged by both  with no increased pain. Then continued with high level balance activities and added ankle weights for increased difficulty. Then at end of session scar massage per pt request so she can compare to how she is doing this at home to ensure she is doing this correctly. Her thigh incisions scar mobility is very good, some knots noticed at lateral hip incision but overall still with good tissue mobility.   OBJECTIVE IMPAIRMENTS: Abnormal gait, decreased activity tolerance, decreased balance, decreased mobility, difficulty walking, decreased ROM, decreased strength, hypomobility, increased fascial restrictions, increased muscle spasms, impaired flexibility, and pain.   ACTIVITY LIMITATIONS: carrying, lifting, standing, squatting, stairs, transfers, dressing, hygiene/grooming, locomotion level, and caring for others  PARTICIPATION LIMITATIONS: meal prep, cleaning, laundry, driving, shopping, community activity, and church  PERSONAL FACTORS: Age, Time since onset of injury/illness/exacerbation, and 1 comorbidity: breast cancer  are also affecting patient's functional outcome.   REHAB POTENTIAL: Good  CLINICAL DECISION MAKING: Evolving/moderate complexity  EVALUATION COMPLEXITY: Moderate   GOALS: Goals reviewed with patient?  Yes  SHORT TERM GOALS: Target date: 03/20/2024   Be independent in initial HEP Baseline: Goal status: INITIAL  2.  Wean from walker to cane for household distances due to increased strength  Baseline:  Goal status: INITIAL  3.  Perform sit to stand transition without UE support with neutral hips due to improved functional strength  Baseline:  Goal status: INITIAL  4.  Improve 6 min walk test to > or = to 750 feet to improve community distance  Baseline: 552 feet  Goal status: INITIAL  5.  Report > or = to 30% reduction in Rt hip pain with daily tasks  Baseline:  Goal status: INITIAL    LONG TERM GOALS: Target date: 04/24/2024  Be independent in advanced HEP Baseline:  Goal status: INITIAL  2.  Improve 5x sit to stand to < or = to 14 seconds to reduce falls risk  Baseline: 19.30 seconds with UE support  Goal status: INITIAL  3.  Improve 6 min walk test to > or = to 900 feet to improve community ambulation  Baseline: 552 feet  Goal status: INITIAL  4.  Wean from walker to cane in the community due to improved balance and safety Baseline:  Goal status: INITIAL  5.  Repot > or = to 60% reduction in Rt LE pain to improve tolerance for housework  Baseline:  Goal status: INITIAL   PLAN:  PT FREQUENCY: 2x/week  PT DURATION: 8 weeks  PLANNED INTERVENTIONS: 97110-Therapeutic exercises, 97530- Therapeutic activity, 97112- Neuromuscular re-education, 97535- Self Care, [id number]- Manual therapy, [phone]- Canalith repositioning, [id number]- Aquatic Therapy, [id number]- Electrical stimulation (unattended), 20560 (1-2 muscles), 20561 (3+ muscles)- Dry Needling, Patient/Family education, Balance training, Stair training, Taping, Joint mobilization, Scar mobilization, Vestibular training, Cryotherapy, and Moist heat  PLAN FOR NEXT SESSION: Review and progress HEP prn; Cont NuStep, gait with walker, work on sit to stand, review HEP, high level balance tasks  Berwyn Knights, PTA 02/28/24  1:03 PM  [facility] [address], [address] [place], [place] [address] Phone # [phone] Fax [phone]

## 2024-02-29 ENCOUNTER — Ambulatory Visit: Admitting: Orthopedic Surgery

## 2024-02-29 ENCOUNTER — Ambulatory Visit: Payer: Self-pay

## 2024-02-29 DIAGNOSIS — S72141G Displaced intertrochanteric fracture of right femur, subsequent encounter for closed fracture with delayed healing: Secondary | ICD-10-CM

## 2024-02-29 NOTE — Progress Notes (Signed)
 Orthopedic Surgery Post-operative Office Visit   Procedure: right intertrochanteric femur fracture s/p CMN Date of Surgery: 12/10/2023 (~3 months post-op)   Assessment: Patient is a 78 y.o. who is doing well after surgery     Plan: -Operative plans complete -Weightbearing as tolerated -No activity restrictions -Will continue to prescribe tramadol  she uses it sparingly -Return to office in 3 months, x-rays needed at next visit: AP/lateral right hip   ___________________________________________________________________________     Subjective: Patient states that she has noticed improvement since she was last in the office.  She states she is walking more.  She is having less pain in the hip.  She still sometimes has pain in the hip if she is particularly active.  She has been doing PT at [facility] and has found this helpful.  Ambulating with a walker.  Feels her strength is improving.  She has been using tramadol  sometimes at night to help with the pain and help her sleep.   Objective:   General: no acute distress, appropriate affect Neurologic: alert, answering questions appropriately, following commands Respiratory: unlabored breathing on room air Skin: incisions are well healed   MSK (RLE): Ambulating with walker, EHL/TA/GSC intact, sensation intact to light touch in sural/saphenous/deep peroneal/superficial peroneal/tibial nerve distributions, foot warm and well-perfused   Imaging: XRs of the right hip from 02/29/2024 were independently reviewed and interpreted, showing a minimally displaced intertrochanteric femur fracture.  No significant alignment change since last films obtained.  Short cephalomedullary rod in place.  No lucency seen around the lag screws or interlocking screw.  No new fracture seen.  No dislocation seen.     Patient name: LILA LUFKIN Patient MRN: [id number] Date of visit: 02/29/24

## 2024-03-04 ENCOUNTER — Ambulatory Visit

## 2024-03-04 DIAGNOSIS — R2689 Other abnormalities of gait and mobility: Secondary | ICD-10-CM

## 2024-03-04 DIAGNOSIS — M6281 Muscle weakness (generalized): Secondary | ICD-10-CM | POA: Diagnosis not present

## 2024-03-04 DIAGNOSIS — M79604 Pain in right leg: Secondary | ICD-10-CM

## 2024-03-04 NOTE — Therapy (Signed)
 OUTPATIENT PHYSICAL THERAPY LOWER EXTREMITY TREATMENT   Patient Name: Marie Day MRN: [id number] DOB:03/09/1946, 78 y.o., female Today's Date: 03/04/2024  END OF SESSION:  PT End of Session - 03/04/24 1109     Visit Number 6    Number of Visits 9    Date for Recertification  04/10/24    Authorization Type [employer]-no auth required    Progress Note Due on Visit 10    PT Start Time 1104    PT Stop Time 1201    PT Time Calculation (min) 57 min    Activity Tolerance Patient tolerated treatment well    Behavior During Therapy [facility] for tasks assessed/performed          Past Medical History:  Diagnosis Date   Arthritis    Breast cancer (HCC)    Cancer (HCC)    Past Surgical History:  Procedure Laterality Date   BREAST BIOPSY Right 01/30/2023   US  [name] BX W LOC DEV 1ST LESION IMG BX SPEC US  GUIDE 01/30/2023 [facility] MAMMOGRAPHY   BREAST RECONSTRUCTION WITH PLACEMENT OF TISSUE EXPANDER AND FLEX HD (ACELLULAR HYDRATED DERMIS) Right 06/29/2021   Procedure: RIGHT BREAST RECONSTRUCTION WITH PLACEMENT OF TISSUE EXPANDER AND FLEX HD (ACELLULAR HYDRATED DERMIS);  Surgeon: Elisabeth [place] RAMAN, MD;  Location: [facility];  Service: Plastics;  Laterality: Right;   BREAST REDUCTION WITH MASTOPEXY Left 06/30/2022   Procedure: LEFT BREAST REDUCTION WITH MASTOPEXY;  Surgeon: Lowery Estefana RAMAN, DO;  Location: [facility];  Service: Plastics;  Laterality: Left;   INTRAMEDULLARY (IM) NAIL INTERTROCHANTERIC Right 12/10/2023   Procedure: FIXATION, FRACTURE, INTERTROCHANTERIC, WITH INTRAMEDULLARY ROD;  Surgeon: Georgina Ozell LABOR, MD;  Location: [facility];  Service: Orthopedics;  Laterality: Right;   MASTECTOMY Right 06/29/2021   MASTECTOMY W/ SENTINEL NODE BIOPSY Right 06/29/2021   Procedure: RIGHT MASTECTOMY WITH SENTINEL LYMPH NODE BIOPSY;  Surgeon: Vanderbilt Ned, MD;  Location: [facility];  Service: General;  Laterality: Right;   PORT-A-CATH REMOVAL Left  06/30/2022   Procedure: REMOVAL PORT-A-CATH;  Surgeon: Lowery Estefana RAMAN, DO;  Location: [facility];  Service: Plastics;  Laterality: Left;   REMOVAL OF TISSUE EXPANDER AND PLACEMENT OF IMPLANT Right 06/30/2022   Procedure: REMOVAL OF TISSUE EXPANDER AND PLACEMENT OF IMPLANT;  Surgeon: Lowery Estefana RAMAN, DO;  Location: [facility];  Service: Plastics;  Laterality: Right;   WISDOM TOOTH EXTRACTION     Patient Active Problem List   Diagnosis Date Noted   Osteoporosis 01/01/2024   Elevated antinuclear antibody (ANA) level 09/27/2023   Insomnia 09/18/2023   Dyslipidemia 03/24/2023   Body mass index (BMI) 24.0-24.9, adult 02/28/2023   Joint swelling 02/28/2023   Other specified abnormal immunological findings in serum 02/28/2023   Pain in limb 02/28/2023   Tinnitus 12/21/2022   Breast cancer (HCC) 12/21/2022   Joint stiffness 09/01/2022   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   Polyarthralgia 02/08/2022   S/P breast reconstruction 06/29/2021   Malignant neoplasm of upper-outer quadrant of right breast in female, estrogen receptor negative (HCC) 12/21/2020   Anemia 10/27/2020   Prediabetes 10/27/2020   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   Unspecified age-related cataract 12/14/2015    PCP: Kennyth Bart, MD  REFERRING PROVIDER: Georgina Ozell, MD  REFERRING DIAG:  Diagnosis  S72.141G (ICD-10-CM) - Displaced intertrochanteric fracture of right femur, subsequent encounter for closed fracture with delayed healing    THERAPY DIAG:  Muscle weakness (generalized)  Other abnormalities of  gait and mobility  Pain in right leg  Rationale for Evaluation and Treatment: Rehabilitation  ONSET DATE: 12/10/23- fall with femur fracture   SUBJECTIVE:   SUBJECTIVE STATEMENT: I can walk about 30% of the time around my house now without my walker. My vision is a little off and I need to go to the eye doctor for anew  prescription. I was due to go this summer before I fell.   From MD note 01/25/24:  Patient states that she is doing well at this point. She is ambulating with a walker. She is able to ambulate up and down stairs as well. She is not having any consistent pain in the hip. She is sparingly using hydrocodone  to control her pain when she does have it. She has not noticed any redness or drainage around her incisions.   PERTINENT HISTORY: Malignant neoplasm of upper-outer quadrant of right breast in female, estrogen receptor negative (HCC) 2023, IM rod placement  PAIN: 03/04/24 PAIN:  Are you having pain? Yes NPRS scale: 1-2/10 Pain location: hip Pain orientation: Right and Anterior  PAIN TYPE: aching Pain description: intermittent  Aggravating factors: prolonged sitting Relieving factors: walking, exercises   PRECAUTIONS: Other: breast cancer and mastectomy   RED FLAGS: None   WEIGHT BEARING RESTRICTIONS: No WBAT per MD   FALLS:  Has patient fallen in last 6 months? Yes. Number of falls 1 on 12/10/23 with IM rod placement on Rt  LIVING ENVIRONMENT: Lives with: lives with their family Lives in: House/apartment Stairs: Yes: Internal: 4 steps; on right going up Has following equipment at home: [name] - 2 wheeled  PLOF: Independent and Leisure: none  PATIENT GOALS: improve mobility, walk without walker  NEXT MD VISIT: 03/31/24  OBJECTIVE:  Note: Objective measures were completed at Evaluation unless otherwise noted.   COGNITION: Overall cognitive status: Within functional limits for tasks assessed     SENSATION: WFL   POSTURE: rounded shoulders, forward head, and weight shift left  PALPATION: Tender over incisions in the Rt anterior and lateral thigh  LOWER EXTREMITY ROM: WFLs   LOWER EXTREMITY MMT: Rt hip 4-/5, Lt 4+/5, bil knees 4+/5   FUNCTIONAL TESTS:  02/14/24 5 times sit to stand:with UE support 19.30 seconds  6 minute walk test: 552 feet with walker with  4/10 RPE -age related norm is 1250 feet  03/04/24:  7 times sit to stand (30 sec): 2 without UE support/ then remaining with UE support 6 minute walk test: 808 ft with walker with 2-3/10 RPE   GAIT: Distance walked: 50 Assistive device utilized: Environmental consultant - 2 wheeled Level of assistance: Modified independence Comments: slow mobility, reduced step length                                                                                                                             TREATMENT DATE:  03/04/24: Therapeutic Exercises NuStep level 6 x 10 mins; UE 8/LE 9; 555 steps Hip Machine  for hip 3 way raises into Rt flex (some pain in anterior hip initially but this decreased by end of first set), abd and then ext 25# x 10 each, then second set 15#, x 10 each returning therapist demo Bil leg press 60#, x 24; 70# 2 x 10  Self Care Discussed pts current functional status for goal assess and how she is doing with current HEP at home, answering her questions about this and continuing to encourage her to incorporate this into her day as much as able. She is doing better with this.  Therapeutic Activities 6 min walk test with walker - 808 ft 30 sec sit to stand x 7 reps, (2 no UE support/5 with UE support) Neuro Re Ed In // bars: Front (4 laps) and retro (3 laps) tandem walking         Side stepping over orange obstacles x 2 laps each  02/28/24: Therapeutic Exercises NuStep level 6 x 10 mins; UE 8/LE 9; 530 steps Hip Machine for hip 3 way raises into Rt flex, abd and then ext 25# x 10 each, then second set 15#, x 10 each returning therapist demo Bil leg press 60#, 3 x 10; pt able to return correct demo after instruction  Neuro Re ed Standing in corner on purple airex for purple all toss x 3 mins at varying heights In // bars: 2# added to each ankle for increased challenge: Front and retro heel-toe walking 3 laps; slow, controlled marching with encouragement to lift Rt knee to hip level as able, 4   laps Stepping over orange obstacles with 2# on ankles: Front x 2 laps, then bil sidestepping x 3 laps each with fingertip support Manual Therapy Scar massage in Lt S/L to Rt lateral hip incisions with cocoa butter at end of session so pt could compare to how she is doing this at home  02/26/24: Therapeutic Exercises NuStep x 12 mins total, level 4 x 3 mins, Level 5 x 7 mins; UE 8/LE 9; 591 steps End of session for bil hip flexor stretch in // bars in runners lunge x 2 reps each Seated EOB for Rt HS stretch x 1 rep x 30 sec holds Therapeutic Activities In // bars with red theraband around Rt ankle for bil hip 3 way raises into flex, ext and abd 2 x 10 each returning therapist demo for each 6 front step up with 3 sec SLS on step with each rep, 2 x 10 returning therapist demo Supine bridges x 10 encouraging full hip ext as much as able Neuro Muscular Re Ed  Heel-toe front and retro x 3 laps each focusing on foot placement and not looking down, then same on Airex beam 4 laps each with encouragement to rely on UE's as little as possible Self Care Spent time discussing new HEP once printed and advised her how to incorporate the different exs into her day at different times. (Bridges in morning before getting out of bed and night before going to bed, then can do standing exs during kitchen or bathroom ADLs)       PATIENT EDUCATION:  Education details: Access Code: [email or web address] Person educated: Patient Education method: Explanation, Demonstration, and Handouts Education comprehension: verbalized understanding and returned demonstration  HOME EXERCISE PROGRAM: Access Code: [email or web address] (use new access code) URL: [email or web address] Date: 02/14/2024 Prepared by: Burnard  Exercises - Seated Hamstring Stretch  - 2 x daily - 7 x weekly - 1 sets - 3 reps - 20 hold -  Sit to Stand Without Arm Support  - 2 x daily - 7 x weekly - 2 sets - 5-10 reps - Seated March  - 2 x daily - 7 x weekly -  3 sets - 10 reps - Seated Long Arc Quad  - 2 x daily - 7 x weekly - 2 sets - 10 reps - 5 hold  Access Code: [email or web address] URL: [email or web address] Date: 02/26/2024 Prepared by: Berwyn Knights  Exercises - Standing Hip Flexion with Resistance (Mirrored)  - 1 x daily - 7 x weekly - 1 sets - 10 reps - 3 hold - Standing Hip Extension with Resistance  - 1 x daily - 7 x weekly - 1 sets - 10 reps - 3 hold - Standing Hip Abduction with Theraband Resistance  - 1 x daily - 7 x weekly - 1 sets - 10 reps - 3 hold - Supine Bridge  - 1 x daily - 7 x weekly - 2 sets - 10 reps - 5 hold - Seated Long Arc Quad with Ankle Weight  - 1 x daily - 7 x weekly - 2 sets - 10 reps - 5 hold - Seated March  - 2 x daily - 7 x weekly - 3 sets - 10 reps - Sit to Stand Without Arm Support  - 2 x daily - 7 x weekly - 2 sets - 5-10 reps - Standing Tandem Balance with Counter Support  - 1 x daily - 7 x weekly - 5 reps - 30-60 hold - Standing Single Leg Stance with Counter Support  - 1 x daily - 7 x weekly - 1 sets - 5 reps - 30-60 hold - Standing Heel Raise with Support  - 1 x daily - 7 x weekly - 3 sets - 10 reps - 3 hold - Runner's Climb  - 2 x daily - 7 x weekly - 2-3 sets - 10 reps - 3-5 hold - Seated Hamstring Stretch  - 1 x daily - 7 x weekly - 1 sets - 3 reps - 20 hold - Standing Hip Flexor Stretch  - 2 x daily - 7 x weekly - 1 sets - 3 reps - 20 hold  ASSESSMENT: CLINICAL IMPRESSION: Reassessed goals today. Pt is making great progress of increasing distance with gait, though she does still have to rely on her walker for this. Able to increase her sit to stand by 2 reps and some without UE support. She is also starting to notice reduced pain during day but does still rely on her pain meds to sleep at night or lese pain wakes her up. Pt at this time is showing good progress overall towards her goals of reducing pain and increasing function.  OBJECTIVE IMPAIRMENTS: Abnormal gait, decreased activity  tolerance, decreased balance, decreased mobility, difficulty walking, decreased ROM, decreased strength, hypomobility, increased fascial restrictions, increased muscle spasms, impaired flexibility, and pain.   ACTIVITY LIMITATIONS: carrying, lifting, standing, squatting, stairs, transfers, dressing, hygiene/grooming, locomotion level, and caring for others  PARTICIPATION LIMITATIONS: meal prep, cleaning, laundry, driving, shopping, community activity, and church  PERSONAL FACTORS: Age, Time since onset of injury/illness/exacerbation, and 1 comorbidity: breast cancer  are also affecting patient's functional outcome.   REHAB POTENTIAL: Good  CLINICAL DECISION MAKING: Evolving/moderate complexity  EVALUATION COMPLEXITY: Moderate   GOALS: Goals reviewed with patient? Yes  SHORT TERM GOALS: Target date: 03/20/2024   Be independent in initial HEP Baseline: 03/04/24 - Pt is independent with initial HEP Goal status: ONGOING  2.  Wean from walker to cane for household distances due to increased strength  Baseline: 03/04/24 - pt still relies on walker ~ 70% of time, other times relies on counter or other things nearby Goal status: ONGOING  3.  Perform sit to stand transition without UE support with neutral hips due to improved functional strength  Baseline: 03/04/24 - increased to 7 reps with 2/7 no UE support Goal status: ONGOING  4.  Improve 6 min walk test to > or = to 750 feet to improve community distance  Baseline: 552 feet; 03/04/24 - 808 ft with walker Goal status: MET  5.  Report > or = to 30% reduction in Rt hip pain with daily tasks  Baseline: 03/04/24 - 30% with pain meds Goal status: MET    LONG TERM GOALS: Target date: 04/24/2024  Be independent in advanced HEP Baseline:  Goal status: ONGOING  2.  Improve 5x sit to stand to < or = to 14 seconds to reduce falls risk  Baseline: 19.30 seconds with UE support; 03/04/24 - 30 sec x 7 reps with/without UE support Goal  status: INITIAL  3.  Improve 6 min walk test to > or = to 900 feet to improve community ambulation  Baseline: 552 feet; 03/04/24 - 808 ft  Goal status: INITIAL  4.  Wean from walker to cane in the community due to improved balance and safety Baseline: 03/04/24 - pt has been struggling with dizziness due to vision changes and is due to see her eye doctor (had appt day after she fell) Goal status: ONGOING  5.  Repot > or = to 60% reduction in Rt LE pain to improve tolerance for housework  Baseline: 03/04/24 - 30% with pain meds at night and baclofen  Goal status: ONGOING   PLAN:  PT FREQUENCY: 2x/week  PT DURATION: 8 weeks  PLANNED INTERVENTIONS: 97110-Therapeutic exercises, 97530- Therapeutic activity, 97112- Neuromuscular re-education, 97535- Self Care, 02859- Manual therapy, [phone]- Canalith repositioning, [id number]- Aquatic Therapy, [id number]- Electrical stimulation (unattended), 20560 (1-2 muscles), 20561 (3+ muscles)- Dry Needling, Patient/Family education, Balance training, Stair training, Taping, Joint mobilization, Scar mobilization, Vestibular training, Cryotherapy, and Moist heat  PLAN FOR NEXT SESSION: Review and progress HEP prn; Cont NuStep, gait with walker, work on sit to stand, review HEP, high level balance tasks  Berwyn Knights, PTA 03/04/24 12:15 PM  [facility] [address] [place], [place] [address] Phone # [phone] Fax [phone]

## 2024-03-06 ENCOUNTER — Ambulatory Visit

## 2024-03-06 DIAGNOSIS — M6281 Muscle weakness (generalized): Secondary | ICD-10-CM

## 2024-03-06 DIAGNOSIS — R2689 Other abnormalities of gait and mobility: Secondary | ICD-10-CM

## 2024-03-06 DIAGNOSIS — M79604 Pain in right leg: Secondary | ICD-10-CM

## 2024-03-06 NOTE — Therapy (Signed)
 OUTPATIENT PHYSICAL THERAPY LOWER EXTREMITY TREATMENT   Patient Name: Marie Day MRN: [id number] DOB:12/07/1945, 78 y.o., female Today's Date: 03/06/2024  END OF SESSION:  PT End of Session - 03/06/24 1131     Visit Number 7    Number of Visits 9    Date for Recertification  04/10/24    Authorization Type [employer]-no auth required    Progress Note Due on Visit 10    PT Start Time 1115    PT Stop Time 1209    PT Time Calculation (min) 54 min    Activity Tolerance Patient tolerated treatment well    Behavior During Therapy [facility] for tasks assessed/performed          Past Medical History:  Diagnosis Date   Arthritis    Breast cancer (HCC)    Cancer (HCC)    Past Surgical History:  Procedure Laterality Date   BREAST BIOPSY Right 01/30/2023   US  [name] BX W LOC DEV 1ST LESION IMG BX SPEC US  GUIDE 01/30/2023 [facility]AMMOGRAPHY   BREAST RECONSTRUCTION WITH PLACEMENT OF TISSUE EXPANDER AND FLEX HD (ACELLULAR HYDRATED DERMIS) Right 06/29/2021   Procedure: RIGHT BREAST RECONSTRUCTION WITH PLACEMENT OF TISSUE EXPANDER AND FLEX HD (ACELLULAR HYDRATED DERMIS);  Surgeon: Elisabeth Craig RAMAN, MD;  Location: [facility];  Service: Plastics;  Laterality: Right;   BREAST REDUCTION WITH MASTOPEXY Left 06/30/2022   Procedure: LEFT BREAST REDUCTION WITH MASTOPEXY;  Surgeon: Lowery Estefana RAMAN, DO;  Location: [facility];  Service: Plastics;  Laterality: Left;   INTRAMEDULLARY (IM) NAIL INTERTROCHANTERIC Right 12/10/2023   Procedure: FIXATION, FRACTURE, INTERTROCHANTERIC, WITH INTRAMEDULLARY ROD;  Surgeon: Georgina Ozell LABOR, MD;  Location: [facility];  Service: Orthopedics;  Laterality: Right;   MASTECTOMY Right 06/29/2021   MASTECTOMY W/ SENTINEL NODE BIOPSY Right 06/29/2021   Procedure: RIGHT MASTECTOMY WITH SENTINEL LYMPH NODE BIOPSY;  Surgeon: Vanderbilt Ned, MD;  Location: [facility];  Service: General;  Laterality: Right;   PORT-A-CATH REMOVAL Left  06/30/2022   Procedure: REMOVAL PORT-A-CATH;  Surgeon: Lowery Estefana RAMAN, DO;  Location: [facility];  Service: Plastics;  Laterality: Left;   REMOVAL OF TISSUE EXPANDER AND PLACEMENT OF IMPLANT Right 06/30/2022   Procedure: REMOVAL OF TISSUE EXPANDER AND PLACEMENT OF IMPLANT;  Surgeon: Lowery Estefana RAMAN, DO;  Location:  SURGERY CENTER;  Service: Plastics;  Laterality: Right;   WISDOM TOOTH EXTRACTION     Patient Active Problem List   Diagnosis Date Noted   Osteoporosis 01/01/2024   Elevated antinuclear antibody (ANA) level 09/27/2023   Insomnia 09/18/2023   Dyslipidemia 03/24/2023   Body mass index (BMI) 24.0-24.9, adult 02/28/2023   Joint swelling 02/28/2023   Other specified abnormal immunological findings in serum 02/28/2023   Pain in limb 02/28/2023   Tinnitus 12/21/2022   Breast cancer (HCC) 12/21/2022   Joint stiffness 09/01/2022   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   Polyarthralgia 02/08/2022   S/P breast reconstruction 06/29/2021   Malignant neoplasm of upper-outer quadrant of right breast in female, estrogen receptor negative (HCC) 12/21/2020   Anemia 10/27/2020   Prediabetes 10/27/2020   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   Unspecified age-related cataract 12/14/2015    PCP: Kennyth Bart, MD  REFERRING PROVIDER: Georgina Ozell, MD  REFERRING DIAG:  Diagnosis  S72.141G (ICD-10-CM) - Displaced intertrochanteric fracture of right femur, subsequent encounter for closed fracture with delayed healing    THERAPY DIAG:  Muscle weakness (generalized)  Other abnormalities of  gait and mobility  Pain in right leg  Rationale for Evaluation and Treatment: Rehabilitation  ONSET DATE: 12/10/23- fall with femur fracture   SUBJECTIVE:   SUBJECTIVE STATEMENT: I'm trying to use my walker less at home and even outside a little.   From MD note 01/25/24:  Patient states that she is doing well at this point.  She is ambulating with a walker. She is able to ambulate up and down stairs as well. She is not having any consistent pain in the hip. She is sparingly using hydrocodone  to control her pain when she does have it. She has not noticed any redness or drainage around her incisions.   PERTINENT HISTORY: Malignant neoplasm of upper-outer quadrant of right breast in female, estrogen receptor negative (HCC) 2023, IM rod placement  PAIN: 03/04/24 PAIN:  Are you having pain? Yes NPRS scale: 1-2/10 Pain location: hip Pain orientation: Right and Anterior  PAIN TYPE: aching Pain description: intermittent  Aggravating factors: prolonged sitting Relieving factors: walking, exercises   PRECAUTIONS: Other: breast cancer and mastectomy   RED FLAGS: None   WEIGHT BEARING RESTRICTIONS: No WBAT per MD   FALLS:  Has patient fallen in last 6 months? Yes. Number of falls 1 on 12/10/23 with IM rod placement on Rt  LIVING ENVIRONMENT: Lives with: lives with their family Lives in: House/apartment Stairs: Yes: Internal: 4 steps; on right going up Has following equipment at home: [name] - 2 wheeled  PLOF: Independent and Leisure: none  PATIENT GOALS: improve mobility, walk without walker  NEXT MD VISIT: 03/31/24  OBJECTIVE:  Note: Objective measures were completed at Evaluation unless otherwise noted.   COGNITION: Overall cognitive status: Within functional limits for tasks assessed     SENSATION: WFL   POSTURE: rounded shoulders, forward head, and weight shift left  PALPATION: Tender over incisions in the Rt anterior and lateral thigh  LOWER EXTREMITY ROM: WFLs   LOWER EXTREMITY MMT: Rt hip 4-/5, Lt 4+/5, bil knees 4+/5   FUNCTIONAL TESTS:  02/14/24 5 times sit to stand:with UE support 19.30 seconds  6 minute walk test: 552 feet with walker with 4/10 RPE -age related norm is 1250 feet  03/04/24:  7 times sit to stand (30 sec): 2 without UE support/ then remaining with UE  support 6 minute walk test: 808 ft with walker with 2-3/10 RPE   GAIT: Distance walked: 50 Assistive device utilized: Environmental consultant - 2 wheeled Level of assistance: Modified independence Comments: slow mobility, reduced step length                                                                                                                             TREATMENT DATE:  03/06/24: Neuro Re Ed In // bars: Front (4 laps) and retro (3 laps) tandem walking         Side stepping over orange obstacles x 2 laps each with 2# on each ankle Standing on blue oval for hip 3  way raises with 2#  x 12 on each ankle x 10 each leg into flex (x 40 with Lt), ext and abd, tactile cues to keep hips level with Lt abd due to weakness in Rt hip abductors Therapeutic Activities  Partial squat in front of chair focusing on neutral alignment of Rt knee and while working to keep hips level x 10 reps Therapeutic Exercises NuStep level 7 x 10 mins; UE 8/LE 9; 480 steps, pt tolerated increased resistance well without increased pain Leg Press 70#, 2 x 20 Hip Machine for hip 3 way raises into Rt flex (no anterior hip pain with decreased weight today), abd and then ext 15# 2 x 12-15 each   03/04/24: Therapeutic Exercises NuStep level 6 x 10 mins; UE 8/LE 9; 555 steps Hip Machine for hip 3 way raises into Rt flex (some pain in anterior hip initially but this decreased by end of first set), abd and then ext 25# x 10 each, then second set 15#, x 10 each returning therapist demo Bil leg press 60#, x 24; 70# 2 x 10  Self Care Discussed pts current functional status for goal assess and how she is doing with current HEP at home, answering her questions about this and continuing to encourage her to incorporate this into her day as much as able. She is doing better with this.  Therapeutic Activities 6 min walk test with walker - 808 ft 30 sec sit to stand x 7 reps, (2 no UE support/5 with UE support) Neuro Re Ed In // bars: Front  (4 laps) and retro (3 laps) tandem walking         Side stepping over orange obstacles x 2 laps each  02/28/24: Therapeutic Exercises NuStep level 6 x 10 mins; UE 8/LE 9; 530 steps Hip Machine for hip 3 way raises into Rt flex, abd and then ext 25# x 10 each, then second set 15#, x 10 each returning therapist demo Bil leg press 60#, 3 x 10; pt able to return correct demo after instruction  Neuro Re ed Standing in corner on purple airex for purple all toss x 3 mins at varying heights In // bars: 2# added to each ankle for increased challenge: Front and retro heel-toe walking 3 laps; slow, controlled marching with encouragement to lift Rt knee to hip level as able, 4  laps Stepping over orange obstacles with 2# on ankles: Front x 2 laps, then bil sidestepping x 3 laps each with fingertip support Manual Therapy Scar massage in Lt S/L to Rt lateral hip incisions with cocoa butter at end of session so pt could compare to how she is doing this at home        PATIENT EDUCATION:  Education details: Access Code: [email or web address] Person educated: Patient Education method: Explanation, Demonstration, and Handouts Education comprehension: verbalized understanding and returned demonstration  HOME EXERCISE PROGRAM: Access Code: [email or web address] (use new access code) URL: [email or web address] Date: 02/14/2024 Prepared by: Burnard  Exercises - Seated Hamstring Stretch  - 2 x daily - 7 x weekly - 1 sets - 3 reps - 20 hold - Sit to Stand Without Arm Support  - 2 x daily - 7 x weekly - 2 sets - 5-10 reps - Seated March  - 2 x daily - 7 x weekly - 3 sets - 10 reps - Seated Long Arc Quad  - 2 x daily - 7 x weekly - 2 sets - 10 reps - 5 hold  Access Code: [email or web address]  URL: [email or web address] Date: 02/26/2024 Prepared by: Berwyn Knights  Exercises - Standing Hip Flexion with Resistance (Mirrored)  - 1 x daily - 7 x weekly - 1 sets - 10 reps - 3 hold - Standing Hip Extension with  Resistance  - 1 x daily - 7 x weekly - 1 sets - 10 reps - 3 hold - Standing Hip Abduction with Theraband Resistance  - 1 x daily - 7 x weekly - 1 sets - 10 reps - 3 hold - Supine Bridge  - 1 x daily - 7 x weekly - 2 sets - 10 reps - 5 hold - Seated Long Arc Quad with Ankle Weight  - 1 x daily - 7 x weekly - 2 sets - 10 reps - 5 hold - Seated March  - 2 x daily - 7 x weekly - 3 sets - 10 reps - Sit to Stand Without Arm Support  - 2 x daily - 7 x weekly - 2 sets - 5-10 reps - Standing Tandem Balance with Counter Support  - 1 x daily - 7 x weekly - 5 reps - 30-60 hold - Standing Single Leg Stance with Counter Support  - 1 x daily - 7 x weekly - 1 sets - 5 reps - 30-60 hold - Standing Heel Raise with Support  - 1 x daily - 7 x weekly - 3 sets - 10 reps - 3 hold - Runner's Climb  - 2 x daily - 7 x weekly - 2-3 sets - 10 reps - 3-5 hold - Seated Hamstring Stretch  - 1 x daily - 7 x weekly - 1 sets - 3 reps - 20 hold - Standing Hip Flexor Stretch  - 2 x daily - 7 x weekly - 1 sets - 3 reps - 20 hold  ASSESSMENT: CLINICAL IMPRESSION: Progressed pt with ankle weight with high level balance activities. She also tolerated increased resistance on NuStep today. Pt conts to note improvement with her Rt LE strength. She has been trying to walk more at home without her walker, including a little outside. Reminded her that when she is transitioning from sit to stand to focus on keeping knees in neutral alignment (prevent valgus).  OBJECTIVE IMPAIRMENTS: Abnormal gait, decreased activity tolerance, decreased balance, decreased mobility, difficulty walking, decreased ROM, decreased strength, hypomobility, increased fascial restrictions, increased muscle spasms, impaired flexibility, and pain.   ACTIVITY LIMITATIONS: carrying, lifting, standing, squatting, stairs, transfers, dressing, hygiene/grooming, locomotion level, and caring for others  PARTICIPATION LIMITATIONS: meal prep, cleaning, laundry, driving,  shopping, community activity, and church  PERSONAL FACTORS: Age, Time since onset of injury/illness/exacerbation, and 1 comorbidity: breast cancer  are also affecting patient's functional outcome.   REHAB POTENTIAL: Good  CLINICAL DECISION MAKING: Evolving/moderate complexity  EVALUATION COMPLEXITY: Moderate   GOALS: Goals reviewed with patient? Yes  SHORT TERM GOALS: Target date: 03/20/2024   Be independent in initial HEP Baseline: 03/04/24 - Pt is independent with initial HEP Goal status: ONGOING  2.  Wean from walker to cane for household distances due to increased strength  Baseline: 03/04/24 - pt still relies on walker ~ 70% of time, other times relies on counter or other things nearby Goal status: ONGOING  3.  Perform sit to stand transition without UE support with neutral hips due to improved functional strength  Baseline: 03/04/24 - increased to 7 reps with 2/7 no UE support Goal status: ONGOING  4.  Improve 6 min walk test to > or = to 750 feet  to improve community distance  Baseline: 552 feet; 03/04/24 - 808 ft with walker Goal status: MET  5.  Report > or = to 30% reduction in Rt hip pain with daily tasks  Baseline: 03/04/24 - 30% with pain meds Goal status: MET    LONG TERM GOALS: Target date: 04/24/2024  Be independent in advanced HEP Baseline:  Goal status: ONGOING  2.  Improve 5x sit to stand to < or = to 14 seconds to reduce falls risk  Baseline: 19.30 seconds with UE support; 03/04/24 - 30 sec x 7 reps with/without UE support Goal status: INITIAL  3.  Improve 6 min walk test to > or = to 900 feet to improve community ambulation  Baseline: 552 feet; 03/04/24 - 808 ft  Goal status: INITIAL  4.  Wean from walker to cane in the community due to improved balance and safety Baseline: 03/04/24 - pt has been struggling with dizziness due to vision changes and is due to see her eye doctor (had appt day after she fell) Goal status: ONGOING  5.  Repot >  or = to 60% reduction in Rt LE pain to improve tolerance for housework  Baseline: 03/04/24 - 30% with pain meds at night and baclofen  Goal status: ONGOING   PLAN:  PT FREQUENCY: 2x/week  PT DURATION: 8 weeks  PLANNED INTERVENTIONS: 97110-Therapeutic exercises, 97530- Therapeutic activity, 97112- Neuromuscular re-education, 97535- Self Care, [id number]- Manual therapy, [phone]- Canalith repositioning, [id number]- Aquatic Therapy, [id number]- Electrical stimulation (unattended), 20560 (1-2 muscles), 20561 (3+ muscles)- Dry Needling, Patient/Family education, Balance training, Stair training, Taping, Joint mobilization, Scar mobilization, Vestibular training, Cryotherapy, and Moist heat  PLAN FOR NEXT SESSION: Review and progress HEP prn; Cont NuStep, gait with walker, work on sit to stand and partial squat in front of chair focusing on keeping Rt knee in neutral alignment, high level balance tasks  Berwyn Knights, PTA 03/06/24 12:16 PM  [facility] [address] [place], [place] [address] Phone # [phone] Fax [phone]

## 2024-03-11 ENCOUNTER — Ambulatory Visit

## 2024-03-11 VITALS — Wt 167.4 lb

## 2024-03-11 DIAGNOSIS — R2689 Other abnormalities of gait and mobility: Secondary | ICD-10-CM

## 2024-03-11 DIAGNOSIS — M79604 Pain in right leg: Secondary | ICD-10-CM

## 2024-03-11 DIAGNOSIS — M6281 Muscle weakness (generalized): Secondary | ICD-10-CM | POA: Diagnosis not present

## 2024-03-11 NOTE — Therapy (Signed)
 OUTPATIENT PHYSICAL THERAPY LOWER EXTREMITY TREATMENT   Patient Name: Marie Day MRN: [id number] DOB:1946-02-01, 78 y.o., female Today's Date: 03/11/2024  END OF SESSION:  PT End of Session - 03/11/24 1207     Visit Number 8    Number of Visits 9    Date for Recertification  04/10/24    Authorization Type [employer]-no auth required    Progress Note Due on Visit 10    PT Start Time 1202    PT Stop Time 1304    PT Time Calculation (min) 62 min          Past Medical History:  Diagnosis Date   Arthritis    Breast cancer (HCC)    Cancer (HCC)    Past Surgical History:  Procedure Laterality Date   BREAST BIOPSY Right 01/30/2023   US  [name] BX W LOC DEV 1ST LESION IMG BX SPEC US  GUIDE 01/30/2023 [facility]   BREAST RECONSTRUCTION WITH PLACEMENT OF TISSUE EXPANDER AND FLEX HD (ACELLULAR HYDRATED DERMIS) Right 06/29/2021   Procedure: RIGHT BREAST RECONSTRUCTION WITH PLACEMENT OF TISSUE EXPANDER AND FLEX HD (ACELLULAR HYDRATED DERMIS);  Surgeon: Elisabeth Craig RAMAN, MD;  Location: [facility];  Service: Plastics;  Laterality: Right;   BREAST REDUCTION WITH MASTOPEXY Left 06/30/2022   Procedure: LEFT BREAST REDUCTION WITH MASTOPEXY;  Surgeon: Lowery Estefana RAMAN, DO;  Location: [facility];  Service: Plastics;  Laterality: Left;   INTRAMEDULLARY (IM) NAIL INTERTROCHANTERIC Right 12/10/2023   Procedure: FIXATION, FRACTURE, INTERTROCHANTERIC, WITH INTRAMEDULLARY ROD;  Surgeon: Georgina Ozell LABOR, MD;  Location: [facility];  Service: Orthopedics;  Laterality: Right;   MASTECTOMY Right 06/29/2021   MASTECTOMY W/ SENTINEL NODE BIOPSY Right 06/29/2021   Procedure: RIGHT MASTECTOMY WITH SENTINEL LYMPH NODE BIOPSY;  Surgeon: Vanderbilt Ned, MD;  Location: [facility];  Service: General;  Laterality: Right;   PORT-A-CATH REMOVAL Left 06/30/2022   Procedure: REMOVAL PORT-A-CATH;  Surgeon: Lowery Estefana RAMAN, DO;  Location: [facility];  Service: Plastics;  Laterality: Left;   REMOVAL OF TISSUE EXPANDER AND PLACEMENT OF IMPLANT Right 06/30/2022   Procedure: REMOVAL OF TISSUE EXPANDER AND PLACEMENT OF IMPLANT;  Surgeon: Lowery Estefana RAMAN, DO;  Location: [facility];  Service: Plastics;  Laterality: Right;   WISDOM TOOTH EXTRACTION     Patient Active Problem List   Diagnosis Date Noted   Osteoporosis 01/01/2024   Elevated antinuclear antibody (ANA) level 09/27/2023   Insomnia 09/18/2023   Dyslipidemia 03/24/2023   Body mass index (BMI) 24.0-24.9, adult 02/28/2023   Joint swelling 02/28/2023   Other specified abnormal immunological findings in serum 02/28/2023   Pain in limb 02/28/2023   Tinnitus 12/21/2022   Breast cancer (HCC) 12/21/2022   Joint stiffness 09/01/2022   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   Polyarthralgia 02/08/2022   S/P breast reconstruction 06/29/2021   Malignant neoplasm of upper-outer quadrant of right breast in female, estrogen receptor negative (HCC) 12/21/2020   Anemia 10/27/2020   Prediabetes 10/27/2020   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   Unspecified age-related cataract 12/14/2015    PCP: Kennyth Bart, MD  REFERRING PROVIDER: Georgina Ozell, MD  REFERRING DIAG:  Diagnosis  S72.141G (ICD-10-CM) - Displaced intertrochanteric fracture of right femur, subsequent encounter for closed fracture with delayed healing    THERAPY DIAG:  Muscle weakness (generalized)  Other abnormalities of gait and mobility  Pain in right leg  Rationale for Evaluation and Treatment: Rehabilitation  ONSET DATE: 12/10/23-  fall with femur fracture   SUBJECTIVE:   SUBJECTIVE STATEMENT: I was able to walk around my house all day Friday without my walker. I had pain that night when trying to sleep again though. I am wokring on sit to stand without hands but can't do it as often as I'd like yet.   From MD note 01/25/24:  Patient states that  she is doing well at this point. She is ambulating with a walker. She is able to ambulate up and down stairs as well. She is not having any consistent pain in the hip. She is sparingly using hydrocodone  to control her pain when she does have it. She has not noticed any redness or drainage around her incisions.   PERTINENT HISTORY: Malignant neoplasm of upper-outer quadrant of right breast in female, estrogen receptor negative (HCC) 2023, IM rod placement  PAIN: 03/11/24 PAIN:  Are you having pain? No, just achy in the Rt hip   PRECAUTIONS: Other: breast cancer and mastectomy   RED FLAGS: None   WEIGHT BEARING RESTRICTIONS: No WBAT per MD   FALLS:  Has patient fallen in last 6 months? Yes. Number of falls 1 on 12/10/23 with IM rod placement on Rt  LIVING ENVIRONMENT: Lives with: lives with their family Lives in: House/apartment Stairs: Yes: Internal: 4 steps; on right going up Has following equipment at home: [name] - 2 wheeled  PLOF: Independent and Leisure: none  PATIENT GOALS: improve mobility, walk without walker  NEXT MD VISIT: 03/31/24  OBJECTIVE:  Note: Objective measures were completed at Evaluation unless otherwise noted.   COGNITION: Overall cognitive status: Within functional limits for tasks assessed     SENSATION: WFL   POSTURE: rounded shoulders, forward head, and weight shift left  PALPATION: Tender over incisions in the Rt anterior and lateral thigh  LOWER EXTREMITY ROM: WFLs   LOWER EXTREMITY MMT: Rt hip 4-/5, Lt 4+/5, bil knees 4+/5   FUNCTIONAL TESTS:  02/14/24 5 times sit to stand:with UE support 19.30 seconds  6 minute walk test: 552 feet with walker with 4/10 RPE -age related norm is 1250 feet  03/04/24:  7 times sit to stand (30 sec): 2 without UE support/ then remaining with UE support 6 minute walk test: 808 ft with walker with 2-3/10 RPE   GAIT: Distance walked: 50 Assistive device utilized: Environmental consultant - 2 wheeled Level of  assistance: Modified independence Comments: slow mobility, reduced step length                                                                                                                             TREATMENT DATE:  03/11/24: 6 month SOZO done, pt WNLs, R/S next 6 month Therapeutic Exercises NuStep level 7 x 10 mins; UE 8/LE 9; 530 steps Leg Press 70#, x 20; 80# x 20, brief reminder to keep hips in neutral alignment but pt did better with this today Hip Machine for hip 3 way raises into  Rt flex, abd and then ext 15# 2 x 15, with 2-3 sec hold with each rep today; VC's to decrease hip compensations that were very min but pt able to demonstrate better muscular control over these today Rt HS stretch x 2 reps, 20 sec holds, bil piriformis stretch x 2 reps x 20 sec each, then demo'd SKTC, DKTC and LTR for pt to try incorporating at home to stretch her low back that she reports also feels tight and tender like a bruise since surgery. Neuro Re Ed In // bars: Front and retro tandem walking x 3 laps each Side stepping over orange obstacles x 3 laps each with 2# on each ankle Therapeutic Activities  Squat in front of chair x 15, no UE support and improved control Sit to stand x 5 reps no UE support and first try, good improvements  03/06/24: Neuro Re Ed In // bars: Front (4 laps) and retro (3 laps) tandem walking         Side stepping over orange obstacles x 2 laps each with 2# on each ankle        Standing on blue oval for hip 3 way raises with 2#  x 12 on each ankle x 10 each leg into flex (x 40 with Lt), ext and abd, tactile cues to keep hips level with Lt abd due to weakness in Rt hip abductors Therapeutic Activities  Partial squat in front of chair focusing on neutral alignment of Rt knee and while working to keep hips level x 10 reps Therapeutic Exercises NuStep level 7 x 10 mins; UE 8/LE 9; 480 steps, pt tolerated increased resistance well without increased pain Leg Press 70#, 2 x 20 Hip  Machine for hip 3 way raises into Rt flex (no anterior hip pain with decreased weight today), abd and then ext 15# 2 x 12-15 each   03/04/24: Therapeutic Exercises NuStep level 6 x 10 mins; UE 8/LE 9; 555 steps Hip Machine for hip 3 way raises into Rt flex (some pain in anterior hip initially but this decreased by end of first set), abd and then ext 25# x 10 each, then second set 15#, x 10 each returning therapist demo Bil leg press 60#, x 24; 70# 2 x 10  Self Care Discussed pts current functional status for goal assess and how she is doing with current HEP at home, answering her questions about this and continuing to encourage her to incorporate this into her day as much as able. She is doing better with this.  Therapeutic Activities 6 min walk test with walker - 808 ft 30 sec sit to stand x 7 reps, (2 no UE support/5 with UE support) Neuro Re Ed In // bars: Front (4 laps) and retro (3 laps) tandem walking         Side stepping over orange obstacles x 2 laps each        PATIENT EDUCATION:  Education details: Access Code: [email or web address] Person educated: Patient Education method: Explanation, Demonstration, and Handouts Education comprehension: verbalized understanding and returned demonstration  HOME EXERCISE PROGRAM: Access Code: [email or web address] (use new access code) URL: [email or web address] Date: 02/14/2024 Prepared by: Burnard  Exercises - Seated Hamstring Stretch  - 2 x daily - 7 x weekly - 1 sets - 3 reps - 20 hold - Sit to Stand Without Arm Support  - 2 x daily - 7 x weekly - 2 sets - 5-10 reps - Seated March  - 2 x daily - 7 x  weekly - 3 sets - 10 reps - Seated Long Arc Quad  - 2 x daily - 7 x weekly - 2 sets - 10 reps - 5 hold  Access Code: [email or web address] URL: [email or web address] Date: 02/26/2024 Prepared by: Berwyn Knights  Exercises - Standing Hip Flexion with Resistance (Mirrored)  - 1 x daily - 7 x weekly - 1 sets - 10 reps - 3 hold -  Standing Hip Extension with Resistance  - 1 x daily - 7 x weekly - 1 sets - 10 reps - 3 hold - Standing Hip Abduction with Theraband Resistance  - 1 x daily - 7 x weekly - 1 sets - 10 reps - 3 hold - Supine Bridge  - 1 x daily - 7 x weekly - 2 sets - 10 reps - 5 hold - Seated Long Arc Quad with Ankle Weight  - 1 x daily - 7 x weekly - 2 sets - 10 reps - 5 hold - Seated March  - 2 x daily - 7 x weekly - 3 sets - 10 reps - Sit to Stand Without Arm Support  - 2 x daily - 7 x weekly - 2 sets - 5-10 reps - Standing Tandem Balance with Counter Support  - 1 x daily - 7 x weekly - 5 reps - 30-60 hold - Standing Single Leg Stance with Counter Support  - 1 x daily - 7 x weekly - 1 sets - 5 reps - 30-60 hold - Standing Heel Raise with Support  - 1 x daily - 7 x weekly - 3 sets - 10 reps - 3 hold - Runner's Climb  - 2 x daily - 7 x weekly - 2-3 sets - 10 reps - 3-5 hold - Seated Hamstring Stretch  - 1 x daily - 7 x weekly - 1 sets - 3 reps - 20 hold - Standing Hip Flexor Stretch  - 2 x daily - 7 x weekly - 1 sets - 3 reps - 20 hold  ASSESSMENT: CLINICAL IMPRESSION: SOZO done this session, pt WNLs and next 6 month SOZO scheduled. Today continued with Rt LE strength focusing on slower motions and decreasing hip/LE compensations. Improvement noted with this today as pt is getting stronger and able to exert improved muscular control. Pt able to perform improved lower squat in front of chair and STS easier with no hands or compensations. Pt reports having a hand massager at home so suggested she try using this 1-2x/day and before bed to see if this will help decrease nighttime pain/discomfort. Also encouraged her to increasing stretching throughout day as well. Pt verbalized good understanding of all.   OBJECTIVE IMPAIRMENTS: Abnormal gait, decreased activity tolerance, decreased balance, decreased mobility, difficulty walking, decreased ROM, decreased strength, hypomobility, increased fascial restrictions,  increased muscle spasms, impaired flexibility, and pain.   ACTIVITY LIMITATIONS: carrying, lifting, standing, squatting, stairs, transfers, dressing, hygiene/grooming, locomotion level, and caring for others  PARTICIPATION LIMITATIONS: meal prep, cleaning, laundry, driving, shopping, community activity, and church  PERSONAL FACTORS: Age, Time since onset of injury/illness/exacerbation, and 1 comorbidity: breast cancer  are also affecting patient's functional outcome.   REHAB POTENTIAL: Good  CLINICAL DECISION MAKING: Evolving/moderate complexity  EVALUATION COMPLEXITY: Moderate   GOALS: Goals reviewed with patient? Yes  SHORT TERM GOALS: Target date: 03/20/2024   Be independent in initial HEP Baseline: 03/04/24 - Pt is independent with initial HEP Goal status: ONGOING  2.  Wean from walker to cane for household distances due to increased strength  Baseline: 03/04/24 - pt still relies on walker ~ 70% of time, other times relies on counter or other things nearby Goal status: ONGOING  3.  Perform sit to stand transition without UE support with neutral hips due to improved functional strength  Baseline: 03/04/24 - increased to 7 reps with 2/7 no UE support Goal status: ONGOING  4.  Improve 6 min walk test to > or = to 750 feet to improve community distance  Baseline: 552 feet; 03/04/24 - 808 ft with walker Goal status: MET  5.  Report > or = to 30% reduction in Rt hip pain with daily tasks  Baseline: 03/04/24 - 30% with pain meds Goal status: MET    LONG TERM GOALS: Target date: 04/24/2024  Be independent in advanced HEP Baseline:  Goal status: ONGOING  2.  Improve 5x sit to stand to < or = to 14 seconds to reduce falls risk  Baseline: 19.30 seconds with UE support; 03/04/24 - 30 sec x 7 reps with/without UE support Goal status: INITIAL  3.  Improve 6 min walk test to > or = to 900 feet to improve community ambulation  Baseline: 552 feet; 03/04/24 - 808 ft  Goal  status: INITIAL  4.  Wean from walker to cane in the community due to improved balance and safety Baseline: 03/04/24 - pt has been struggling with dizziness due to vision changes and is due to see her eye doctor (had appt day after she fell) Goal status: ONGOING  5.  Repot > or = to 60% reduction in Rt LE pain to improve tolerance for housework  Baseline: 03/04/24 - 30% with pain meds at night and baclofen  Goal status: ONGOING   PLAN:  PT FREQUENCY: 2x/week  PT DURATION: 8 weeks  PLANNED INTERVENTIONS: 97110-Therapeutic exercises, 97530- Therapeutic activity, 97112- Neuromuscular re-education, 97535- Self Care, [id number]- Manual therapy, [phone]- Canalith repositioning, [id number]- Aquatic Therapy, [id number]- Electrical stimulation (unattended), 20560 (1-2 muscles), 20561 (3+ muscles)- Dry Needling, Patient/Family education, Balance training, Stair training, Taping, Joint mobilization, Scar mobilization, Vestibular training, Cryotherapy, and Moist heat  PLAN FOR NEXT SESSION: Is hand massager and more stretching helping decrease low back and Rt hip tightness/tenderness? Review and progress HEP prn; Cont NuStep, gait with walker, work on sit to stand and partial squat in front of chair focusing on keeping Rt knee in neutral alignment, high level balance tasks  Berwyn Knights, PTA 03/11/24 1:17 PM  [facility] [address], [address] [place], [place] [address] Phone # [phone] Fax [phone]

## 2024-03-13 ENCOUNTER — Ambulatory Visit

## 2024-03-13 DIAGNOSIS — M6281 Muscle weakness (generalized): Secondary | ICD-10-CM | POA: Diagnosis not present

## 2024-03-13 DIAGNOSIS — R2689 Other abnormalities of gait and mobility: Secondary | ICD-10-CM

## 2024-03-13 DIAGNOSIS — M79604 Pain in right leg: Secondary | ICD-10-CM

## 2024-03-13 NOTE — Therapy (Signed)
 OUTPATIENT PHYSICAL THERAPY LOWER EXTREMITY TREATMENT   Patient Name: Marie Day MRN: [id number] DOB:1945-09-28, 78 y.o., female Today's Date: 03/13/2024  END OF SESSION:  PT End of Session - 03/13/24 1158     Visit Number 9    Number of Visits 9    Date for Recertification  04/10/24    Authorization Type [employer]-no auth required    Progress Note Due on Visit 10    PT Start Time 1156    PT Stop Time 1247    PT Time Calculation (min) 51 min    Activity Tolerance Patient tolerated treatment well    Behavior During Therapy WFL for tasks assessed/performed          Past Medical History:  Diagnosis Date   Arthritis    Breast cancer (HCC)    Cancer (HCC)    Past Surgical History:  Procedure Laterality Date   BREAST BIOPSY Right 01/30/2023   US  [name] BX W LOC DEV 1ST LESION IMG BX SPEC US  GUIDE 01/30/2023 [facility]   BREAST RECONSTRUCTION WITH PLACEMENT OF TISSUE EXPANDER AND FLEX HD (ACELLULAR HYDRATED DERMIS) Right 06/29/2021   Procedure: RIGHT BREAST RECONSTRUCTION WITH PLACEMENT OF TISSUE EXPANDER AND FLEX HD (ACELLULAR HYDRATED DERMIS);  Surgeon: Elisabeth Craig RAMAN, MD;  Location: [facility];  Service: Plastics;  Laterality: Right;   BREAST REDUCTION WITH MASTOPEXY Left 06/30/2022   Procedure: LEFT BREAST REDUCTION WITH MASTOPEXY;  Surgeon: Lowery Estefana RAMAN, DO;  Location: [facility];  Service: Plastics;  Laterality: Left;   INTRAMEDULLARY (IM) NAIL INTERTROCHANTERIC Right 12/10/2023   Procedure: FIXATION, FRACTURE, INTERTROCHANTERIC, WITH INTRAMEDULLARY ROD;  Surgeon: Georgina Ozell LABOR, MD;  Location: [facility];  Service: Orthopedics;  Laterality: Right;   MASTECTOMY Right 06/29/2021   MASTECTOMY W/ SENTINEL NODE BIOPSY Right 06/29/2021   Procedure: RIGHT MASTECTOMY WITH SENTINEL LYMPH NODE BIOPSY;  Surgeon: Vanderbilt Ned, MD;  Location: [facility];  Service: General;  Laterality: Right;   PORT-A-CATH REMOVAL Left  06/30/2022   Procedure: REMOVAL PORT-A-CATH;  Surgeon: Lowery Estefana RAMAN, DO;  Location: [facility];  Service: Plastics;  Laterality: Left;   REMOVAL OF TISSUE EXPANDER AND PLACEMENT OF IMPLANT Right 06/30/2022   Procedure: REMOVAL OF TISSUE EXPANDER AND PLACEMENT OF IMPLANT;  Surgeon: Lowery Estefana RAMAN, DO;  Location: [facility];  Service: Plastics;  Laterality: Right;   WISDOM TOOTH EXTRACTION     Patient Active Problem List   Diagnosis Date Noted   Osteoporosis 01/01/2024   Elevated antinuclear antibody (ANA) level 09/27/2023   Insomnia 09/18/2023   Dyslipidemia 03/24/2023   Body mass index (BMI) 24.0-24.9, adult 02/28/2023   Joint swelling 02/28/2023   Other specified abnormal immunological findings in serum 02/28/2023   Pain in limb 02/28/2023   Tinnitus 12/21/2022   Breast cancer (HCC) 12/21/2022   Joint stiffness 09/01/2022   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   Polyarthralgia 02/08/2022   S/P breast reconstruction 06/29/2021   Malignant neoplasm of upper-outer quadrant of right breast in female, estrogen receptor negative (HCC) 12/21/2020   Anemia 10/27/2020   Prediabetes 10/27/2020   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 Sometimes it slows me down.   From MD note 01/25/24:  Patient states that she is doing well  at this point. She is ambulating with a walker. She is able to ambulate up and down stairs as well. She is not having any consistent pain in the hip. She is sparingly using hydrocodone  to control her pain when she does have it. She has not noticed any redness or drainage around her incisions.   PERTINENT HISTORY: Malignant neoplasm of upper-outer quadrant of right breast in female, estrogen receptor negative (HCC) 2023, IM rod placement  PAIN: 03/11/24 PAIN:  Are you having pain? No, just achy in the Rt hip   PRECAUTIONS: Other: breast cancer and mastectomy   RED FLAGS: None   WEIGHT BEARING RESTRICTIONS: No WBAT per MD   FALLS:  Has patient fallen in last 6 months? Yes. Number of falls 1 on 12/10/23 with IM rod placement on Rt  LIVING ENVIRONMENT: Lives with: lives with their family Lives in: House/apartment Stairs: Yes: Internal: 4 steps; on right going up Has following equipment at home: [name] - 2 wheeled  PLOF: Independent and Leisure: none  PATIENT GOALS: improve mobility, walk without walker  NEXT MD VISIT: 03/31/24  OBJECTIVE:  Note: Objective measures were completed at Evaluation unless otherwise noted.   COGNITION: Overall cognitive status: Within functional limits for tasks assessed     SENSATION: WFL   POSTURE: rounded shoulders, forward head, and weight shift left  PALPATION: Tender over incisions in the Rt anterior and lateral thigh  LOWER EXTREMITY ROM: WFLs   LOWER EXTREMITY MMT: Rt hip 4-/5, Lt 4+/5, bil knees 4+/5   FUNCTIONAL TESTS:  02/14/24 5 times sit to stand:with UE support 19.30 seconds  6 minute walk test: 552 feet with walker with 4/10 RPE -age related norm is 1250 feet  03/04/24:  7 times sit to stand (30 sec): 2 without UE support/ then remaining with UE support 6 minute walk test: 808 ft with walker with 2-3/10 RPE   GAIT: Distance walked: 50 Assistive device utilized: Environmental consultant - 2 wheeled Level of assistance: Modified  independence Comments: slow mobility, reduced step length                                                                                                                             TREATMENT DATE:  03/13/24: Therapeutic Exercises NuStep level 7 x 10 mins; UE 8/LE 9; 530 steps; VC's to decrease valgus throughout Leg press 80#, 2 x 20 Standing in front of mat table with hands on chair in front of her and Rt foot on mat for Rt hip flexor stretch, 2 reps, x 20-30 sec Neuro Re Ed Practiced transitioning to [facility] x 375  ft. Pt practiced 3 point vs 2 point pattern and did well with 2 point pattern after demo and VC's. She was also able to perform with step through pattern without difficulty. Then continued to use [facility] for rest of session ambulating throughout clinic.  Standing in corner on purple airex for ball toss at varying heights x 3 mins In // bars for all following: Front and retro tandem walking on airex beam x 5 laps each, encouraged VC's for fingertip support  Therapeutic Activities SLS on blue oval for bil hip 3 way raises x 15 each with 2# on each ankle with VC's to encourage decreased UE support by using fingertips only as able.  Mini squats on purple airex x 20   03/11/24: 6 month SOZO done, pt WNLs, R/S next 6 month Therapeutic Exercises NuStep level 7 x 10 mins; UE 8/LE 9; 530 steps Leg Press 70#, x 20; 80# x 20, brief reminder to keep hips in neutral alignment but pt did better with this today Hip Machine for hip 3 way raises into Rt flex, abd and then ext 15# 2 x 15, with 2-3 sec hold with each rep today; VC's to decrease hip compensations that were very min but pt able to demonstrate better muscular control over these today Rt HS stretch x 2 reps, 20 sec holds, bil piriformis stretch x 2 reps x 20 sec each, then demo'd SKTC, DKTC and LTR for pt to try incorporating at home to stretch her low back that she reports also feels tight and tender like a bruise since surgery. Neuro Re  Ed In // bars: Front and retro tandem walking x 3 laps each Side stepping over orange obstacles x 3 laps each with 2# on each ankle Therapeutic Activities  Squat in front of chair x 15, no UE support and improved control Sit to stand x 5 reps no UE support and first try, good improvements  03/06/24: Neuro Re Ed In // bars: Front (4 laps) and retro (3 laps) tandem walking         Side stepping over orange obstacles x 2 laps each with 2# on each ankle        Standing on blue oval for hip 3 way raises with 2#  x 12 on each ankle x 10 each leg into flex (x 40 with Lt), ext and abd, tactile cues to keep hips level with Lt abd due to weakness in Rt hip abductors Therapeutic Activities  Partial squat in front of chair focusing on neutral alignment of Rt knee and while working to keep hips level x 10 reps Therapeutic Exercises NuStep level 7 x 10 mins; UE 8/LE 9; 480 steps, pt tolerated increased resistance well without increased pain Leg Press 70#, 2 x 20 Hip Machine for hip 3 way raises into Rt flex (no anterior hip pain with decreased weight today), abd and then ext 15# 2 x 12-15 each      PATIENT EDUCATION:  Education details: Access Code: [email or web address] Person educated: Patient Education method: Explanation, Demonstration, and Handouts Education comprehension: verbalized understanding and returned demonstration  HOME EXERCISE PROGRAM: Access Code: [email or web address] (use new access code) URL: [email or web address] Date: 02/14/2024 Prepared by: Burnard  Exercises - Seated Hamstring Stretch  - 2 x daily - 7 x weekly - 1 sets - 3 reps - 20 hold - Sit to Stand Without Arm Support  - 2 x daily - 7 x weekly - 2 sets - 5-10 reps - Seated March  -  2 x daily - 7 x weekly - 3 sets - 10 reps - Seated Long Arc Quad  - 2 x daily - 7 x weekly - 2 sets - 10 reps - 5 hold  Access Code: [email or web address] URL: [email or web address] Date: 02/26/2024 Prepared by: Berwyn Knights  Exercises - Standing Hip Flexion with Resistance (Mirrored)  - 1 x daily - 7 x weekly - 1 sets - 10 reps - 3 hold - Standing Hip Extension with Resistance  - 1 x daily - 7 x weekly - 1 sets - 10 reps - 3 hold - Standing Hip Abduction with Theraband Resistance  - 1 x daily - 7 x weekly - 1 sets - 10 reps - 3 hold - Supine Bridge  - 1 x daily - 7 x weekly - 2 sets - 10 reps - 5 hold - Seated Long Arc Quad with Ankle Weight  - 1 x daily - 7 x weekly - 2 sets - 10 reps - 5 hold - Seated March  - 2 x daily - 7 x weekly - 3 sets - 10 reps - Sit to Stand Without Arm Support  - 2 x daily - 7 x weekly - 2 sets - 5-10 reps - Standing Tandem Balance with Counter Support  - 1 x daily - 7 x weekly - 5 reps - 30-60 hold - Standing Single Leg Stance with Counter Support  - 1 x daily - 7 x weekly - 1 sets - 5 reps - 30-60 hold - Standing Heel Raise with Support  - 1 x daily - 7 x weekly - 3 sets - 10 reps - 3 hold - Runner's Climb  - 2 x daily - 7 x weekly - 2-3 sets - 10 reps - 3-5 hold - Seated Hamstring Stretch  - 1 x daily - 7 x weekly - 1 sets - 3 reps - 20 hold - Standing Hip Flexor Stretch  - 2 x daily - 7 x weekly - 1 sets - 3 reps - 20 hold  ASSESSMENT: CLINICAL IMPRESSION: Goal met for 5 STS in less than 14 sec (pt did in 13 sec). Began practicing transitioning from walker to [facility] today. After practice pt able to demonstrate improved confidence with gait. She reports feeling safe and able to walk at increased pace than with her walker. Used SPC for entirety of session throughout clinic. Encouraged less UE support with activities in // bars and she was able to increase weights on leg press.  OBJECTIVE IMPAIRMENTS: Abnormal gait, decreased activity tolerance, decreased balance, decreased mobility, difficulty walking, decreased ROM, decreased strength, hypomobility, increased fascial restrictions, increased muscle spasms, impaired flexibility, and pain.   ACTIVITY LIMITATIONS: carrying,  lifting, standing, squatting, stairs, transfers, dressing, hygiene/grooming, locomotion level, and caring for others  PARTICIPATION LIMITATIONS: meal prep, cleaning, laundry, driving, shopping, community activity, and church  PERSONAL FACTORS: Age, Time since onset of injury/illness/exacerbation, and 1 comorbidity: breast cancer  are also affecting patient's functional outcome.   REHAB POTENTIAL: Good  CLINICAL DECISION MAKING: Evolving/moderate complexity  EVALUATION COMPLEXITY: Moderate   GOALS: Goals reviewed with patient? Yes  SHORT TERM GOALS: Target date: 03/20/2024   Be independent in initial HEP Baseline: 03/04/24 - Pt is independent with initial HEP Goal status: MET  2.  Wean from walker to cane for household distances due to increased strength  Baseline: 03/04/24 - pt still relies on walker ~ 70% of time, other times relies on counter or other things nearby; 03/13/24 - pt  reports mostly walks around house without AD now Goal status: MET  3.  Perform sit to stand transition without UE support with neutral hips due to improved functional strength  Baseline: 03/04/24 - increased to 7 reps with 2/7 no UE support; 03/13/24 - with controlled motion pt able to perform correctly Goal status: MET  4.  Improve 6 min walk test to > or = to 750 feet to improve community distance  Baseline: 552 feet; 03/04/24 - 808 ft with walker Goal status: MET  5.  Report > or = to 30% reduction in Rt hip pain with daily tasks  Baseline: 03/04/24 - 30% with pain meds Goal status: MET    LONG TERM GOALS: Target date: 04/24/2024  Be independent in advanced HEP Baseline:  Goal status: ONGOING  2.  Improve 5x sit to stand to < or = to 14 seconds to reduce falls risk  Baseline: 19.30 seconds with UE support; 03/04/24 - 30 sec x 7 reps with/without UE support; 03/13/24 - 5 STS in 13 sec Goal status: MET  3.  Improve 6 min walk test to > or = to 900 feet to improve community ambulation   Baseline: 552 feet; 03/04/24 - 808 ft  Goal status: INITIAL  4.  Wean from walker to cane in the community due to improved balance and safety Baseline: 03/04/24 - pt has been struggling with dizziness due to vision changes and is due to see her eye doctor (had appt day after she fell) Goal status: ONGOING  5.  Repot > or = to 60% reduction in Rt LE pain to improve tolerance for housework  Baseline: 03/04/24 - 30% with pain meds at night and baclofen  Goal status: ONGOING   PLAN:  PT FREQUENCY: 2x/week  PT DURATION: 8 weeks  PLANNED INTERVENTIONS: 97110-Therapeutic exercises, 97530- Therapeutic activity, 97112- Neuromuscular re-education, 97535- Self Care, 02859- Manual therapy, [phone]- Canalith repositioning, [id number]- Aquatic Therapy, [id number]- Electrical stimulation (unattended), 20560 (1-2 muscles), 20561 (3+ muscles)- Dry Needling, Patient/Family education, Balance training, Stair training, Taping, Joint mobilization, Scar mobilization, Vestibular training, Cryotherapy, and Moist heat  PLAN FOR NEXT SESSION: Assess goals and renew if appropriate; Is hand massager and more stretching helping decrease low back and Rt hip tightness/tenderness? Cont to progress HEP prn; Cont NuStep, cont gait with SPC/did pt get one? work on sit to stand and partial squat in front of chair focusing on keeping Rt knee in neutral alignment, cont high level balance tasks including stepping over obstacles as SLS is challenging and lifting Rt knee  Berwyn Knights, PTA 03/13/24 1:04 PM  [facility] [address] [place], [place] [address] Phone # [phone] Fax [phone]

## 2024-03-15 MED ORDER — TRAMADOL HCL 50 MG PO TABS: 50.0000 mg | ORAL_TABLET | Freq: Four times a day (QID) | ORAL | 0 refills | Status: AC | PRN

## 2024-03-15 NOTE — Addendum Note (Signed)
 Addended by: GEORGINA SHARPER on: 03/15/2024 07:30 AM   Modules accepted: Orders

## 2024-03-18 ENCOUNTER — Ambulatory Visit

## 2024-03-18 DIAGNOSIS — R293 Abnormal posture: Secondary | ICD-10-CM

## 2024-03-18 DIAGNOSIS — M6281 Muscle weakness (generalized): Secondary | ICD-10-CM | POA: Diagnosis not present

## 2024-03-18 DIAGNOSIS — M79604 Pain in right leg: Secondary | ICD-10-CM

## 2024-03-18 DIAGNOSIS — R2689 Other abnormalities of gait and mobility: Secondary | ICD-10-CM

## 2024-03-18 NOTE — Therapy (Signed)
 OUTPATIENT PHYSICAL THERAPY LOWER EXTREMITY TREATMENT   Patient Name: Marie Day MRN: [id number] DOB:16-Jun-1945, 78 y.o., female Today's Date: 03/18/2024 Progress Note Reporting Period 02/14/24 to 03/18/24  See note below for Objective Data and Assessment of Progress/Goals.     END OF SESSION:  PT End of Session - 03/18/24 1320     Visit Number 10    Date for Recertification  04/24/24    Authorization Type [employer]-no auth required    Progress Note Due on Visit 20    PT Start Time 1232    PT Stop Time 1318    PT Time Calculation (min) 46 min    Activity Tolerance Patient tolerated treatment well    Behavior During Therapy WFL for tasks assessed/performed           Past Medical History:  Diagnosis Date   Arthritis    Breast cancer (HCC)    Cancer (HCC)    Past Surgical History:  Procedure Laterality Date   BREAST BIOPSY Right 01/30/2023   US  [name] BX W LOC DEV 1ST LESION IMG BX SPEC US  GUIDE 01/30/2023 [facility]   BREAST RECONSTRUCTION WITH PLACEMENT OF TISSUE EXPANDER AND FLEX HD (ACELLULAR HYDRATED DERMIS) Right 06/29/2021   Procedure: RIGHT BREAST RECONSTRUCTION WITH PLACEMENT OF TISSUE EXPANDER AND FLEX HD (ACELLULAR HYDRATED DERMIS);  Surgeon: Elisabeth Craig RAMAN, MD;  Location: [facility];  Service: Plastics;  Laterality: Right;   BREAST REDUCTION WITH MASTOPEXY Left 06/30/2022   Procedure: LEFT BREAST REDUCTION WITH MASTOPEXY;  Surgeon: Lowery Estefana RAMAN, DO;  Location: [facility];  Service: Plastics;  Laterality: Left;   INTRAMEDULLARY (IM) NAIL INTERTROCHANTERIC Right 12/10/2023   Procedure: FIXATION, FRACTURE, INTERTROCHANTERIC, WITH INTRAMEDULLARY ROD;  Surgeon: Georgina Ozell LABOR, MD;  Location: [facility];  Service: Orthopedics;  Laterality: Right;   MASTECTOMY Right 06/29/2021   MASTECTOMY W/ SENTINEL NODE BIOPSY Right 06/29/2021   Procedure: RIGHT MASTECTOMY WITH SENTINEL LYMPH NODE BIOPSY;  Surgeon: Vanderbilt Ned,  MD;  Location: [facility];  Service: General;  Laterality: Right;   PORT-A-CATH REMOVAL Left 06/30/2022   Procedure: REMOVAL PORT-A-CATH;  Surgeon: Lowery Estefana RAMAN, DO;  Location: [facility];  Service: Plastics;  Laterality: Left;   REMOVAL OF TISSUE EXPANDER AND PLACEMENT OF IMPLANT Right 06/30/2022   Procedure: REMOVAL OF TISSUE EXPANDER AND PLACEMENT OF IMPLANT;  Surgeon: Lowery Estefana RAMAN, DO;  Location: 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   Polyarthralgia 02/08/2022   S/P breast reconstruction 06/29/2021   Malignant neoplasm of upper-outer quadrant of right breast in female, estrogen receptor negative (HCC) 12/21/2020   Anemia 10/27/2020   Prediabetes 10/27/2020   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   Unspecified age-related cataract 12/14/2015    PCP: Kennyth Bart, MD  REFERRING PROVIDER: Georgina Ozell, MD  REFERRING DIAG:  Diagnosis  S72.141G (ICD-10-CM) - Displaced intertrochanteric fracture of right femur, subsequent encounter for closed fracture with  delayed healing    THERAPY DIAG:  Muscle weakness (generalized)  Other abnormalities of gait and mobility  Pain in right leg  Abnormal posture  Rationale for Evaluation and Treatment: Rehabilitation  ONSET DATE: 12/10/23- fall with femur fracture   SUBJECTIVE:   SUBJECTIVE STATEMENT: I've been walking  without my walker around the house.  I haven't gotten a cane yet.  I have intermittent pain in my Rt leg.  From MD note 01/25/24:  Patient states that she is doing well at this point. She is ambulating with a walker. She is able to ambulate up and down stairs as well. She is not having any consistent pain in the hip. She is sparingly using hydrocodone  to control her pain when she does have it. She has not noticed any redness or drainage around her incisions.   PERTINENT HISTORY: Malignant neoplasm of upper-outer quadrant of right breast in female, estrogen receptor negative (HCC) 2023, IM rod placement  PAIN: 03/18/24 PAIN:  Are you having pain? No, just achy in the Rt hip  PRECAUTIONS: Other: breast cancer and mastectomy   RED FLAGS: None   WEIGHT BEARING RESTRICTIONS: No WBAT per MD   FALLS:  Has patient fallen in last 6 months? Yes. Number of falls 1 on 12/10/23 with IM rod placement on Rt  LIVING ENVIRONMENT: Lives with: lives with their family Lives in: House/apartment Stairs: Yes: Internal: 4 steps; on right going up Has following equipment at home: [name] - 2 wheeled  PLOF: Independent and Leisure: none  PATIENT GOALS: improve mobility, walk without walker  NEXT MD VISIT: 03/31/24  OBJECTIVE:  Note: Objective measures were completed at Evaluation unless otherwise noted.   COGNITION: Overall cognitive status: Within functional limits for tasks assessed     SENSATION: WFL   POSTURE: rounded shoulders, forward head, and weight shift left  PALPATION: Tender over incisions in the Rt anterior and lateral thigh  LOWER EXTREMITY ROM: WFLs   LOWER EXTREMITY MMT: Rt hip 4-/5, Lt 4+/5, bil knees 4+/5   FUNCTIONAL TESTS:  02/14/24 5 times sit to stand:with UE support 19.30 seconds  6 minute walk test: 552 feet with walker with 4/10 RPE -age related norm is 1250 feet  03/04/24:  7 times sit to stand (30 sec): 2 without UE support/ then remaining with UE support 6  minute walk test: 808 ft with walker with 2-3/10 RPE   GAIT: Distance walked: 50 Assistive device utilized: [employer] - 2 wheeled Level of assistance: Modified independence Comments: slow mobility, reduced step length                                                                                                                             TREATMENT DATE:   03/18/24: Therapeutic Exercises NuStep level 7 x 10 mins; UE 8/LE 9-PT present to discuss progress and cueing to reduce hip adduction Leg press 80#, 2 x 20 bil, 40# Rt and Lt 40# 2x10 bil each  Seated hamstring stretch 3x20 seconds bil  Neuro Re Ed Practiced transitioning to SPC x 375 ft. Pt practiced 3 point vs 2 point pattern and did well with 2 point pattern after demo and VC's. She was also able to perform with step through pattern without difficulty. Then continued to use [facility] for rest of session ambulating throughout clinic.  Standing in corner on purple airex for ball toss at varying heights x 3 mins In // bars for all following: Front and retro tandem walking on airex beam x 5 laps each, encouraged VC's for fingertip support  Step to gait over hurdles: forward and lateral x 3 laps each with min UE support at barre  Therapeutic Activities Hip abduction and extension standing on balance pad 2x10 bil with min UE support at barre  Mini squats on purple airex x 20    03/13/24: Therapeutic Exercises NuStep level 7 x 10 mins; UE 8/LE 9; 530 steps; VC's to decrease valgus throughout-PT present to discuss progress Leg press 80#, 2 x 20 Standing in front of mat table with hands on chair in front of her and Rt foot on mat for Rt hip flexor stretch, 2 reps, x 20-30 sec Neuro Re Ed Practiced transitioning to SPC x 375 ft. Pt practiced 3 point vs 2 point pattern and did well with 2 point pattern after demo and VC's. She was also able to perform with step through pattern without difficulty. Then continued to use [facility] for rest of session  ambulating throughout clinic.  Standing in corner on purple airex for ball toss at varying heights x 3 mins In // bars for all following: Front and retro tandem walking on airex beam x 5 laps each, encouraged VC's for fingertip support  Therapeutic Activities SLS on blue oval for bil hip 3 way raises x 15 each with 2# on each ankle with VC's to encourage decreased UE support by using fingertips only as able.  Mini squats on purple airex x 20   03/11/24: 6 month SOZO done, pt WNLs, R/S next 6 month Therapeutic Exercises NuStep level 7 x 10 mins; UE 8/LE 9; 530 steps Leg Press 70#, x 20; 80# x 20, brief reminder to keep hips in neutral alignment but pt did better with this today Hip Machine for hip 3 way raises into Rt flex, abd and then ext 15# 2 x 15, with 2-3 sec hold with each rep today; VC's to decrease hip compensations that were very min but pt able to demonstrate better muscular control over these today Rt HS stretch x 2 reps, 20 sec holds, bil piriformis stretch x 2 reps x 20 sec each, then demo'd SKTC, DKTC and LTR for pt to try incorporating at home to stretch her low back that she reports also feels tight and tender like a bruise since surgery. Neuro Re Ed In // bars: Front and retro tandem walking x 3 laps each Side stepping over orange obstacles x 3 laps each with 2# on each ankle Therapeutic Activities  Squat in front of chair x 15, no UE support and improved control Sit to stand x 5 reps no UE support and first try, good improvements   PATIENT EDUCATION:  Education details: Access Code: [email or web address] Person educated: Patient Education method: Explanation, Demonstration, and Handouts Education comprehension: verbalized understanding and returned demonstration  HOME EXERCISE PROGRAM: Access Code: [email or web address] (use new access code) URL: [email or web address] Date: 02/14/2024 Prepared by: Burnard  Exercises - Seated Hamstring Stretch  - 2 x daily -  7 x weekly - 1 sets  - 3 reps - 20 hold - Sit to Stand Without Arm Support  - 2 x daily - 7 x weekly - 2 sets - 5-10 reps - Seated March  - 2 x daily - 7 x weekly - 3 sets - 10 reps - Seated Long Arc Quad  - 2 x daily - 7 x weekly - 2 sets - 10 reps - 5 hold  Access Code: [email or web address] URL: [email or web address] Date: 02/26/2024 Prepared by: Berwyn Knights  Exercises - Standing Hip Flexion with Resistance (Mirrored)  - 1 x daily - 7 x weekly - 1 sets - 10 reps - 3 hold - Standing Hip Extension with Resistance  - 1 x daily - 7 x weekly - 1 sets - 10 reps - 3 hold - Standing Hip Abduction with Theraband Resistance  - 1 x daily - 7 x weekly - 1 sets - 10 reps - 3 hold - Supine Bridge  - 1 x daily - 7 x weekly - 2 sets - 10 reps - 5 hold - Seated Long Arc Quad with Ankle Weight  - 1 x daily - 7 x weekly - 2 sets - 10 reps - 5 hold - Seated March  - 2 x daily - 7 x weekly - 3 sets - 10 reps - Sit to Stand Without Arm Support  - 2 x daily - 7 x weekly - 2 sets - 5-10 reps - Standing Tandem Balance with Counter Support  - 1 x daily - 7 x weekly - 5 reps - 30-60 hold - Standing Single Leg Stance with Counter Support  - 1 x daily - 7 x weekly - 1 sets - 5 reps - 30-60 hold - Standing Heel Raise with Support  - 1 x daily - 7 x weekly - 3 sets - 10 reps - 3 hold - Runner's Climb  - 2 x daily - 7 x weekly - 2-3 sets - 10 reps - 3-5 hold - Seated Hamstring Stretch  - 1 x daily - 7 x weekly - 1 sets - 3 reps - 20 hold - Standing Hip Flexor Stretch  - 2 x daily - 7 x weekly - 1 sets - 3 reps - 20 hold  ASSESSMENT: CLINICAL IMPRESSION: Pt demonstrates improved stability overall and is walking around the house without device for short periods.  Functional tests have improved since the start of care.  She did well with single leg on leg press.  Continued practice with single point cane today. Pt demonstrates good stability and she reports feeling safe and able to walk at increased pace than with her walker for  longer distances.  PT monitored throughout session for safety and cueing.  Cues to reduce knee valgus with exercise. Patient will benefit from skilled PT to address the below impairments and improve overall function.   OBJECTIVE IMPAIRMENTS: Abnormal gait, decreased activity tolerance, decreased balance, decreased mobility, difficulty walking, decreased ROM, decreased strength, hypomobility, increased fascial restrictions, increased muscle spasms, impaired flexibility, and pain.   ACTIVITY LIMITATIONS: carrying, lifting, standing, squatting, stairs, transfers, dressing, hygiene/grooming, locomotion level, and caring for others  PARTICIPATION LIMITATIONS: meal prep, cleaning, laundry, driving, shopping, community activity, and church  PERSONAL FACTORS: Age, Time since onset of injury/illness/exacerbation, and 1 comorbidity: breast cancer  are also affecting patient's functional outcome.   REHAB POTENTIAL: Good  CLINICAL DECISION MAKING: Evolving/moderate complexity  EVALUATION COMPLEXITY: Moderate   GOALS: Goals reviewed with patient? Yes  SHORT  TERM GOALS: Target date: 03/20/2024   Be independent in initial HEP Baseline: 03/04/24 - Pt is independent with initial HEP Goal status: MET  2.  Wean from walker to cane for household distances due to increased strength  Baseline: 03/04/24 - pt still relies on walker ~ 70% of time, other times relies on counter or other things nearby; 03/13/24 - pt reports mostly walks around house without AD now Goal status: MET  3.  Perform sit to stand transition without UE support with neutral hips due to improved functional strength  Baseline: 03/04/24 - increased to 7 reps with 2/7 no UE support; 03/13/24 - with controlled motion pt able to perform correctly Goal status: MET  4.  Improve 6 min walk test to > or = to 750 feet to improve community distance  Baseline: 552 feet; 03/04/24 - 808 ft with walker Goal status: MET  5.  Report > or = to 30%  reduction in Rt hip pain with daily tasks  Baseline: 03/04/24 - 30% with pain meds Goal status: MET    LONG TERM GOALS: Target date: 04/24/2024  Be independent in advanced HEP Baseline:  Goal status: ONGOING  2.  Improve 5x sit to stand to < or = to 14 seconds to reduce falls risk  Baseline: 19.30 seconds with UE support; 03/04/24 - 30 sec x 7 reps with/without UE support; 03/13/24 - 5 STS in 13 sec Goal status: MET  3.  Improve 6 min walk test to > or = to 900 feet to improve community ambulation  Baseline: 552 feet; 03/04/24 - 808 ft  Goal status: INITIAL  4.  Wean from walker to cane in the community due to improved balance and safety Baseline: 03/04/24 - pt has been struggling with dizziness due to vision changes and is due to see her eye doctor (had appt day after she fell) Goal status: ONGOING  5.  Repot > or = to 60% reduction in Rt LE pain to improve tolerance for housework  Baseline: 03/04/24 - 30% with pain meds at night and baclofen  Goal status: ONGOING   PLAN:  PT FREQUENCY: 2x/week  PT DURATION: 8 weeks  PLANNED INTERVENTIONS: 97110-Therapeutic exercises, 97530- Therapeutic activity, 97112- Neuromuscular re-education, 97535- Self Care, 02859- Manual therapy, [phone]- Canalith repositioning, [id number]- Aquatic Therapy, [id number]- Electrical stimulation (unattended), 20560 (1-2 muscles), 20561 (3+ muscles)- Dry Needling, Patient/Family education, Balance training, Stair training, Taping, Joint mobilization, Scar mobilization, Vestibular training, Cryotherapy, and Moist heat  PLAN FOR NEXT SESSION: 6 min walk test with walker or cane, Cont to progress HEP prn; Cont NuStep, cont gait with SPC,  work on sit to stand and partial squat in front of chair focusing on keeping Rt knee in neutral alignment, cont high level balance tasks including stepping over obstacles as SLS is challenging and lifting Rt knee  Burnard Joy, PT 03/18/24 1:22 PM   [facility]  [facility] [address], [address] [place], [place] [address] Phone # [phone] Fax [phone]

## 2024-03-20 ENCOUNTER — Ambulatory Visit

## 2024-03-20 DIAGNOSIS — M6281 Muscle weakness (generalized): Secondary | ICD-10-CM | POA: Diagnosis not present

## 2024-03-20 DIAGNOSIS — R293 Abnormal posture: Secondary | ICD-10-CM

## 2024-03-20 DIAGNOSIS — R2689 Other abnormalities of gait and mobility: Secondary | ICD-10-CM

## 2024-03-20 DIAGNOSIS — M79604 Pain in right leg: Secondary | ICD-10-CM

## 2024-03-20 NOTE — Therapy (Signed)
 OUTPATIENT PHYSICAL THERAPY LOWER EXTREMITY TREATMENT   Patient Name: Marie Day MRN: [id number] DOB:[birth year], 78 y.o., female Today's Date: 03/20/2024     END OF SESSION:  PT End of Session - 03/20/24 1157     Visit Number 11    Number of Visits 16    Date for Recertification  04/24/24    Authorization Type [employer]-no auth required    Progress Note Due on Visit 20    PT Start Time 1156    PT Stop Time 1251    PT Time Calculation (min) 55 min    Activity Tolerance Patient tolerated treatment well    Behavior During Therapy [facility] for tasks assessed/performed           Past Medical History:  Diagnosis Date   Arthritis    Breast cancer (HCC)    Cancer (HCC)    Past Surgical History:  Procedure Laterality Date   BREAST BIOPSY Right 01/30/2023   US  [name] BX W LOC DEV 1ST LESION IMG BX SPEC US  GUIDE 01/30/2023 [facility] MAMMOGRAPHY   BREAST RECONSTRUCTION WITH PLACEMENT OF TISSUE EXPANDER AND FLEX HD (ACELLULAR HYDRATED DERMIS) Right 06/29/2021   Procedure: RIGHT BREAST RECONSTRUCTION WITH PLACEMENT OF TISSUE EXPANDER AND FLEX HD (ACELLULAR HYDRATED DERMIS);  Surgeon: Elisabeth Craig RAMAN, MD;  Location: [facility];  Service: Plastics;  Laterality: Right;   BREAST REDUCTION WITH MASTOPEXY Left 06/30/2022   Procedure: LEFT BREAST REDUCTION WITH MASTOPEXY;  Surgeon: Lowery Estefana RAMAN, DO;  Location: [facility];  Service: Plastics;  Laterality: Left;   INTRAMEDULLARY (IM) NAIL INTERTROCHANTERIC Right 12/10/2023   Procedure: FIXATION, FRACTURE, INTERTROCHANTERIC, WITH INTRAMEDULLARY ROD;  Surgeon: Georgina Ozell LABOR, MD;  Location: [facility];  Service: Orthopedics;  Laterality: Right;   MASTECTOMY Right 06/29/2021   MASTECTOMY W/ SENTINEL NODE BIOPSY Right 06/29/2021   Procedure: RIGHT MASTECTOMY WITH SENTINEL LYMPH NODE BIOPSY;  Surgeon: Vanderbilt Ned, MD;  Location: [facility];  Service: General;  Laterality: Right;   PORT-A-CATH  REMOVAL Left 06/30/2022   Procedure: REMOVAL PORT-A-CATH;  Surgeon: Lowery Estefana RAMAN, DO;  Location: [facility];  Service: Plastics;  Laterality: Left;   REMOVAL OF TISSUE EXPANDER AND PLACEMENT OF IMPLANT Right 06/30/2022   Procedure: REMOVAL OF TISSUE EXPANDER AND PLACEMENT OF IMPLANT;  Surgeon: Lowery Estefana RAMAN, DO;  Location: [facility];  Service: Plastics;  Laterality: Right;   WISDOM TOOTH EXTRACTION     Patient Active Problem List   Diagnosis Date Noted   Osteoporosis 01/01/2024   Elevated antinuclear antibody (ANA) level 09/27/2023   Insomnia 09/18/2023   Dyslipidemia 03/24/2023   Body mass index (BMI) 24.0-24.9, adult 02/28/2023   Joint swelling 02/28/2023   Other specified abnormal immunological findings in serum 02/28/2023   Pain in limb 02/28/2023   Tinnitus 12/21/2022   Breast cancer (HCC) 12/21/2022   Joint stiffness 09/01/2022   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   Polyarthralgia 02/08/2022   S/P breast reconstruction 06/29/2021   Malignant neoplasm of upper-outer quadrant of right breast in female, estrogen receptor negative (HCC) 12/21/2020   Anemia 10/27/2020   Prediabetes 10/27/2020   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   Unspecified age-related cataract 12/14/2015    PCP: Kennyth Bart, MD  REFERRING PROVIDER: Georgina Ozell, MD  REFERRING DIAG:  Diagnosis  S72.141G (ICD-10-CM) - Displaced intertrochanteric fracture of right femur, subsequent encounter for closed fracture with delayed healing    THERAPY DIAG:  Muscle weakness (generalized)  Other abnormalities of gait and mobility  Pain in right leg  Abnormal posture  Rationale for Evaluation and Treatment: Rehabilitation  ONSET DATE: 12/10/23- fall with femur fracture   SUBJECTIVE:   SUBJECTIVE STATEMENT: I did a lot of cleaning around my house yesterday so I am pretty tired today. I had trouble getting comfortable and  falling asleep last night because my hip was so achy.  From MD note 01/25/24:  Patient states that she is doing well at this point. She is ambulating with a walker. She is able to ambulate up and down stairs as well. She is not having any consistent pain in the hip. She is sparingly using hydrocodone  to control her pain when she does have it. She has not noticed any redness or drainage around her incisions.   PERTINENT HISTORY: Malignant neoplasm of upper-outer quadrant of right breast in female, estrogen receptor negative (HCC) 2023, IM rod placement  PAIN: 03/18/24 PAIN:  Are you having pain? No, just achy in the Rt hip  PRECAUTIONS: Other: breast cancer and mastectomy   RED FLAGS: None   WEIGHT BEARING RESTRICTIONS: No WBAT per MD   FALLS:  Has patient fallen in last 6 months? Yes. Number of falls 1 on 12/10/23 with IM rod placement on Rt  LIVING ENVIRONMENT: Lives with: lives with their family Lives in: House/apartment Stairs: Yes: Internal: 4 steps; on right going up Has following equipment at home: [name] - 2 wheeled  PLOF: Independent and Leisure: none  PATIENT GOALS: improve mobility, walk without walker  NEXT MD VISIT: 03/31/24  OBJECTIVE:  Note: Objective measures were completed at Evaluation unless otherwise noted.   COGNITION: Overall cognitive status: Within functional limits for tasks assessed     SENSATION: WFL   POSTURE: rounded shoulders, forward head, and weight shift left  PALPATION: Tender over incisions in the Rt anterior and lateral thigh  LOWER EXTREMITY ROM: WFLs   LOWER EXTREMITY MMT: Rt hip 4-/5, Lt 4+/5, bil knees 4+/5   FUNCTIONAL TESTS:  02/14/24 5 times sit to stand:with UE support 19.30 seconds  6 minute walk test: 552 feet with walker with 4/10 RPE -age related norm is 1250 feet  03/04/24:  7 times sit to stand (30 sec): 2 without UE support/ then remaining with UE support 6 minute walk test: 808 ft with walker with 2-3/10  RPE   GAIT: Distance walked: 50 Assistive device utilized: [employer] - 2 wheeled Level of assistance: Modified independence Comments: slow mobility, reduced step length                                                                                                                             TREATMENT DATE:  03/20/24: Therapeutic Exercises NuStep level 7 x 10 mins; UE 8/LE 9, 715 steps Leg press 80#, 2 x 20 bil, 35# Rt (felt a little weaker today), then second set 30# and Lt 40# 2 x 20 each leg Hip Machine for hip  3 way raises into bil flex, abd and then ext 15# x 20 each Seated hamstring stretch 3x20 seconds bil  Neuro Re Ed  Standing on upside down bosu for squats with +2 UE support 2 x 10 Continued with focus on walking with SPC x 250 ft around clinic Stepping over orange obstacles with 1# added to each ankle at counter and with SPC: forward x 4 laps and then bil sidestepping x 2 each   03/18/24: Therapeutic Exercises NuStep level 7 x 10 mins; UE 8/LE 9-PT present to discuss progress and cueing to reduce hip adduction Leg press 80#, 2 x 20 bil, 40# Rt and Lt 40# 2x10 bil each  Seated hamstring stretch 3x20 seconds bil  Neuro Re Ed Practiced transitioning to SPC x 375 ft. Pt practiced 3 point vs 2 point pattern and did well with 2 point pattern after demo and VC's. She was also able to perform with step through pattern without difficulty. Then continued to use [facility] for rest of session ambulating throughout clinic.  Standing in corner on purple airex for ball toss at varying heights x 3 mins In // bars for all following: Front and retro tandem walking on airex beam x 5 laps each, encouraged VC's for fingertip support  Step to gait over hurdles: forward and lateral x 3 laps each with min UE support at barre  Therapeutic Activities Hip abduction and extension standing on balance pad 2x10 bil with min UE support at barre  Mini squats on purple airex x 20    03/13/24: Therapeutic  Exercises NuStep level 7 x 10 mins; UE 8/LE 9; 530 steps; VC's to decrease valgus throughout-PT present to discuss progress Leg press 80#, 2 x 20 Standing in front of mat table with hands on chair in front of her and Rt foot on mat for Rt hip flexor stretch, 2 reps, x 20-30 sec Neuro Re Ed Practiced transitioning to SPC x 375 ft. Pt practiced 3 point vs 2 point pattern and did well with 2 point pattern after demo and VC's. She was also able to perform with step through pattern without difficulty. Then continued to use [facility] for rest of session ambulating throughout clinic.  Standing in corner on purple airex for ball toss at varying heights x 3 mins In // bars for all following: Front and retro tandem walking on airex beam x 5 laps each, encouraged VC's for fingertip support  Therapeutic Activities SLS on blue oval for bil hip 3 way raises x 15 each with 2# on each ankle with VC's to encourage decreased UE support by using fingertips only as able.  Mini squats on purple airex x 20   03/11/24: 6 month SOZO done, pt WNLs, R/S next 6 month Therapeutic Exercises NuStep level 7 x 10 mins; UE 8/LE 9; 530 steps Leg Press 70#, x 20; 80# x 20, brief reminder to keep hips in neutral alignment but pt did better with this today Hip Machine for hip 3 way raises into Rt flex, abd and then ext 15# 2 x 15, with 2-3 sec hold with each rep today; VC's to decrease hip compensations that were very min but pt able to demonstrate better muscular control over these today Rt HS stretch x 2 reps, 20 sec holds, bil piriformis stretch x 2 reps x 20 sec each, then demo'd SKTC, DKTC and LTR for pt to try incorporating at home to stretch her low back that she reports also feels tight and tender like a  bruise since surgery. Neuro Re Ed In // bars: Front and retro tandem walking x 3 laps each Side stepping over orange obstacles x 3 laps each with 2# on each ankle Therapeutic Activities  Squat in front of chair x 15, no UE  support and improved control Sit to stand x 5 reps no UE support and first try, good improvements   PATIENT EDUCATION:  Education details: Access Code: [email or web address] Person educated: Patient Education method: Explanation, Demonstration, and Handouts Education comprehension: verbalized understanding and returned demonstration  HOME EXERCISE PROGRAM: Access Code: [email or web address] (use new access code) URL: [email or web address] Date: 02/14/2024 Prepared by: Burnard  Exercises - Seated Hamstring Stretch  - 2 x daily - 7 x weekly - 1 sets - 3 reps - 20 hold - Sit to Stand Without Arm Support  - 2 x daily - 7 x weekly - 2 sets - 5-10 reps - Seated March  - 2 x daily - 7 x weekly - 3 sets - 10 reps - Seated Long Arc Quad  - 2 x daily - 7 x weekly - 2 sets - 10 reps - 5 hold  Access Code: [email or web address] URL: [email or web address] Date: 02/26/2024 Prepared by: Berwyn Knights  Exercises - Standing Hip Flexion with Resistance (Mirrored)  - 1 x daily - 7 x weekly - 1 sets - 10 reps - 3 hold - Standing Hip Extension with Resistance  - 1 x daily - 7 x weekly - 1 sets - 10 reps - 3 hold - Standing Hip Abduction with Theraband Resistance  - 1 x daily - 7 x weekly - 1 sets - 10 reps - 3 hold - Supine Bridge  - 1 x daily - 7 x weekly - 2 sets - 10 reps - 5 hold - Seated Long Arc Quad with Ankle Weight  - 1 x daily - 7 x weekly - 2 sets - 10 reps - 5 hold - Seated March  - 2 x daily - 7 x weekly - 3 sets - 10 reps - Sit to Stand Without Arm Support  - 2 x daily - 7 x weekly - 2 sets - 5-10 reps - Standing Tandem Balance with Counter Support  - 1 x daily - 7 x weekly - 5 reps - 30-60 hold - Standing Single Leg Stance with Counter Support  - 1 x daily - 7 x weekly - 1 sets - 5 reps - 30-60 hold - Standing Heel Raise with Support  - 1 x daily - 7 x weekly - 3 sets - 10 reps - 3 hold - Runner's Climb  - 2 x daily - 7 x weekly - 2-3 sets - 10 reps - 3-5 hold - Seated Hamstring Stretch  -  1 x daily - 7 x weekly - 1 sets - 3 reps - 20 hold - Standing Hip Flexor Stretch  - 2 x daily - 7 x weekly - 1 sets - 3 reps - 20 hold  ASSESSMENT: CLINICAL IMPRESSION: Pt comes in c/o increased Rt LE fatigue and feeling tired overall from increased housework yesterday. This caused some increased discomfort in her Rt hip last night so she also didn't sleep well. Due to this decreased weights for some of activities but overall pt did well and continued with her hip strength and stability exercises.   OBJECTIVE IMPAIRMENTS: Abnormal gait, decreased activity tolerance, decreased balance, decreased mobility, difficulty walking, decreased ROM, decreased strength, hypomobility, increased fascial restrictions, increased muscle spasms, impaired flexibility, and pain.  ACTIVITY LIMITATIONS: carrying, lifting, standing, squatting, stairs, transfers, dressing, hygiene/grooming, locomotion level, and caring for others  PARTICIPATION LIMITATIONS: meal prep, cleaning, laundry, driving, shopping, community activity, and church  PERSONAL FACTORS: Age, Time since onset of injury/illness/exacerbation, and 1 comorbidity: breast cancer  are also affecting patient's functional outcome.   REHAB POTENTIAL: Good  CLINICAL DECISION MAKING: Evolving/moderate complexity  EVALUATION COMPLEXITY: Moderate   GOALS: Goals reviewed with patient? Yes  SHORT TERM GOALS: Target date: 03/20/2024   Be independent in initial HEP Baseline: 03/04/24 - Pt is independent with initial HEP Goal status: MET  2.  Wean from walker to cane for household distances due to increased strength  Baseline: 03/04/24 - pt still relies on walker ~ 70% of time, other times relies on counter or other things nearby; 03/13/24 - pt reports mostly walks around house without AD now Goal status: MET  3.  Perform sit to stand transition without UE support with neutral hips due to improved functional strength  Baseline: 03/04/24 - increased to 7  reps with 2/7 no UE support; 03/13/24 - with controlled motion pt able to perform correctly Goal status: MET  4.  Improve 6 min walk test to > or = to 750 feet to improve community distance  Baseline: 552 feet; 03/04/24 - 808 ft with walker Goal status: MET  5.  Report > or = to 30% reduction in Rt hip pain with daily tasks  Baseline: 03/04/24 - 30% with pain meds Goal status: MET    LONG TERM GOALS: Target date: 04/24/2024  Be independent in advanced HEP Baseline:  Goal status: ONGOING  2.  Improve 5x sit to stand to < or = to 14 seconds to reduce falls risk  Baseline: 19.30 seconds with UE support; 03/04/24 - 30 sec x 7 reps with/without UE support; 03/13/24 - 5 STS in 13 sec Goal status: MET  3.  Improve 6 min walk test to > or = to 900 feet to improve community ambulation  Baseline: 552 feet; 03/04/24 - 808 ft  Goal status: INITIAL  4.  Wean from walker to cane in the community due to improved balance and safety Baseline: 03/04/24 - pt has been struggling with dizziness due to vision changes and is due to see her eye doctor (had appt day after she fell) Goal status: ONGOING  5.  Repot > or = to 60% reduction in Rt LE pain to improve tolerance for housework  Baseline: 03/04/24 - 30% with pain meds at night and baclofen  Goal status: ONGOING   PLAN:  PT FREQUENCY: 2x/week  PT DURATION: 8 weeks  PLANNED INTERVENTIONS: 97110-Therapeutic exercises, 97530- Therapeutic activity, 97112- Neuromuscular re-education, 97535- Self Care, 02859- Manual therapy, [id number]- Canalith repositioning, [id number]- Aquatic Therapy, G0283- Electrical stimulation (unattended), 20560 (1-2 muscles), 20561 (3+ muscles)- Dry Needling, Patient/Family education, Balance training, Stair training, Taping, Joint mobilization, Scar mobilization, Vestibular training, Cryotherapy, and Moist heat  PLAN FOR NEXT SESSION: 6 min walk test with walker or cane, Cont to progress HEP prn; Cont NuStep, cont gait with SPC,   work on sit to stand and partial squat in front of chair focusing on keeping Rt knee in neutral alignment, cont high level balance tasks including stepping over obstacles as SLS is challenging and lifting Rt knee  Berwyn Knights, PTA 03/20/24 1:09 PM   [facility] [address], [address] [place], [place] [address] Phone # [phone] Fax [phone]

## 2024-03-25 ENCOUNTER — Encounter: Payer: Self-pay | Admitting: Radiology

## 2024-03-25 ENCOUNTER — Ambulatory Visit: Attending: Orthopedic Surgery

## 2024-03-25 ENCOUNTER — Encounter: Payer: No Typology Code available for payment source | Admitting: Family Medicine

## 2024-03-25 DIAGNOSIS — M79604 Pain in right leg: Secondary | ICD-10-CM | POA: Diagnosis present

## 2024-03-25 DIAGNOSIS — R293 Abnormal posture: Secondary | ICD-10-CM | POA: Insufficient documentation

## 2024-03-25 DIAGNOSIS — R2689 Other abnormalities of gait and mobility: Secondary | ICD-10-CM | POA: Diagnosis present

## 2024-03-25 DIAGNOSIS — M6281 Muscle weakness (generalized): Secondary | ICD-10-CM | POA: Insufficient documentation

## 2024-03-25 NOTE — Therapy (Signed)
 OUTPATIENT PHYSICAL THERAPY LOWER EXTREMITY TREATMENT   Patient Name: Marie Day MRN: [id number] DOB:[birth year], 78 y.o., female Today's Date: 03/25/2024     END OF SESSION:  PT End of Session - 03/25/24 1323     Visit Number 12    Date for Recertification  04/24/24    Authorization Type [employer]-no auth required    Progress Note Due on Visit 20    PT Start Time 1233    PT Stop Time 1316    PT Time Calculation (min) 43 min    Activity Tolerance Patient tolerated treatment well    Behavior During Therapy [facility] for tasks assessed/performed            Past Medical History:  Diagnosis Date   Arthritis    Breast cancer (HCC)    Cancer (HCC)    Past Surgical History:  Procedure Laterality Date   BREAST BIOPSY Right 01/30/2023   US  [name] BX W LOC DEV 1ST LESION IMG BX SPEC US  GUIDE 01/30/2023 [facility] MAMMOGRAPHY   BREAST RECONSTRUCTION WITH PLACEMENT OF TISSUE EXPANDER AND FLEX HD (ACELLULAR HYDRATED DERMIS) Right 06/29/2021   Procedure: RIGHT BREAST RECONSTRUCTION WITH PLACEMENT OF TISSUE EXPANDER AND FLEX HD (ACELLULAR HYDRATED DERMIS);  Surgeon: Elisabeth Craig RAMAN, MD;  Location: [facility];  Service: Plastics;  Laterality: Right;   BREAST REDUCTION WITH MASTOPEXY Left 06/30/2022   Procedure: LEFT BREAST REDUCTION WITH MASTOPEXY;  Surgeon: Lowery Estefana RAMAN, DO;  Location: [facility];  Service: Plastics;  Laterality: Left;   INTRAMEDULLARY (IM) NAIL INTERTROCHANTERIC Right 12/10/2023   Procedure: FIXATION, FRACTURE, INTERTROCHANTERIC, WITH INTRAMEDULLARY ROD;  Surgeon: Georgina Ozell LABOR, MD;  Location: [facility];  Service: Orthopedics;  Laterality: Right;   MASTECTOMY Right 06/29/2021   MASTECTOMY W/ SENTINEL NODE BIOPSY Right 06/29/2021   Procedure: RIGHT MASTECTOMY WITH SENTINEL LYMPH NODE BIOPSY;  Surgeon: Vanderbilt Ned, MD;  Location: [facility];  Service: General;  Laterality: Right;   PORT-A-CATH REMOVAL Left 06/30/2022    Procedure: REMOVAL PORT-A-CATH;  Surgeon: Lowery Estefana RAMAN, DO;  Location: [facility];  Service: Plastics;  Laterality: Left;   REMOVAL OF TISSUE EXPANDER AND PLACEMENT OF IMPLANT Right 06/30/2022   Procedure: REMOVAL OF TISSUE EXPANDER AND PLACEMENT OF IMPLANT;  Surgeon: Lowery Estefana RAMAN, DO;  Location: 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   Polyarthralgia 02/08/2022   S/P breast reconstruction 06/29/2021   Malignant neoplasm of upper-outer quadrant of right breast in female, estrogen receptor negative (HCC) 12/21/2020   Anemia 10/27/2020   Prediabetes 10/27/2020   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 I might get a cane for community distances.   From MD note 01/25/24:  Patient states that she is  doing well at this point. She is ambulating with a walker. She is able to ambulate up and down stairs as well. She is not having any consistent pain in the hip. She is sparingly using hydrocodone  to control her pain when she does have it. She has not noticed any redness or drainage around her incisions.   PERTINENT HISTORY: Malignant neoplasm of upper-outer quadrant of right breast in female, estrogen receptor negative (HCC) 2023, IM rod placement  PAIN: 03/18/24 PAIN:  Are you having pain? No, just achy in the Rt hip  PRECAUTIONS: Other: breast cancer and mastectomy   RED FLAGS: None   WEIGHT BEARING RESTRICTIONS: No WBAT per MD   FALLS:  Has patient fallen in last 6 months? Yes. Number of falls 1 on 12/10/23 with IM rod placement on Rt  LIVING ENVIRONMENT: Lives with: lives with their family Lives in: House/apartment Stairs: Yes: Internal: 4 steps; on right going up Has following equipment at home: [name] - 2 wheeled  PLOF: Independent and Leisure: none  PATIENT GOALS: improve mobility, walk without walker  NEXT MD VISIT: 03/31/24  OBJECTIVE:  Note: Objective measures were completed at Evaluation unless otherwise noted.   COGNITION: Overall cognitive status: Within functional limits for tasks assessed     SENSATION: WFL   POSTURE: rounded shoulders, forward head, and weight shift left  PALPATION: Tender over incisions in the Rt anterior and lateral thigh  LOWER EXTREMITY ROM: WFLs   LOWER EXTREMITY MMT: Rt hip 4-/5, Lt 4+/5, bil knees 4+/5   FUNCTIONAL TESTS:  02/14/24 5 times sit to stand:with UE support 19.30 seconds  6 minute walk test: 552 feet with walker with 4/10 RPE -age related norm is 1250 feet  03/04/24:  7 times sit to stand (30 sec): 2 without UE support/ then remaining with UE support 6 minute walk test: 808 ft with walker with 2-3/10 RPE   GAIT: Distance walked: 50 Assistive device utilized: [employer] - 2 wheeled Level of assistance:  Modified independence Comments: slow mobility, reduced step length                                                                                                                             TREATMENT DATE:   03/25/24: Therapeutic Exercises NuStep level 8 x 10 mins; UE 8/LE 9, 526 steps Leg press 80#, 2 x 20 bil, 35# Rt, then second set 30# and Lt 40# 2 x 20 each leg Seated hamstring stretch 3x20 seconds bil  Neuro Re Ed  Discussion regarding use of cane for shorter community distances and walker for longer distances Resisted walking: 10#  forward and reverse x 8 each  Sit to stand with yellow loop around thighs 2x10  03/20/24: Therapeutic Exercises NuStep level 7 x 10 mins; UE 8/LE 9, 715 steps Leg press 80#, 2 x 20 bil, 35# Rt (felt a little weaker today), then second set 30# and Lt 40# 2 x 20 each leg Hip Machine for hip 3 way raises into bil flex, abd and then ext 15# x 20 each Seated hamstring stretch 3x20 seconds bil  Neuro Re Ed  Standing on upside down bosu for squats with +2 UE support 2 x 10 Continued with focus on walking with SPC x 250 ft around clinic Stepping over orange obstacles with 1# added to each ankle at counter and with SPC: forward x 4 laps and then bil sidestepping x 2 each   03/18/24: Therapeutic Exercises NuStep level 7 x 10 mins; UE 8/LE 9-PT present to discuss progress and cueing to reduce hip adduction Leg press 80#, 2 x 20 bil, 40# Rt and Lt 40# 2x10 bil each  Seated hamstring stretch 3x20 seconds bil  Neuro Re Ed Practiced transitioning to SPC x 375 ft. Pt practiced 3 point vs 2 point pattern and did well with 2 point pattern after demo and VC's. She was also able to perform with step through pattern without difficulty. Then continued to use [facility] for rest of session ambulating throughout clinic.  Standing in corner on purple airex for ball toss at varying heights x 3 mins In // bars for all following: Front and retro tandem walking on airex beam x 5  laps each, encouraged VC's for fingertip support  Step to gait over hurdles: forward and lateral x 3 laps each with min UE support at barre  Therapeutic Activities Hip abduction and extension standing on balance pad 2x10 bil with min UE support at barre  Mini squats on purple airex x 20     PATIENT EDUCATION:  Education details: Access Code: [email or web address] Person educated: Patient Education method: Explanation, Demonstration, and Handouts Education comprehension: verbalized understanding and returned demonstration  HOME EXERCISE PROGRAM: Access Code: [email or web address] (use new access code) URL: [email or web address] Date: 02/14/2024 Prepared by: Burnard  Exercises - Seated Hamstring Stretch  - 2 x daily - 7 x weekly - 1 sets - 3 reps - 20 hold - Sit to Stand Without Arm Support  - 2 x daily - 7 x weekly - 2 sets - 5-10 reps - Seated March  - 2 x daily - 7 x weekly - 3 sets - 10 reps - Seated Long Arc Quad  - 2 x daily - 7 x weekly - 2 sets - 10 reps - 5 hold  Access Code: [email or web address] URL: [email or web address] Date: 02/26/2024 Prepared by: Berwyn Knights  Exercises - Standing Hip Flexion with Resistance (Mirrored)  - 1 x daily - 7 x weekly - 1 sets - 10 reps - 3 hold - Standing Hip Extension with Resistance  - 1 x daily - 7 x weekly - 1 sets - 10 reps - 3 hold - Standing Hip Abduction with Theraband Resistance  - 1 x daily - 7 x weekly - 1 sets - 10 reps - 3 hold - Supine Bridge  - 1 x daily - 7 x weekly - 2 sets - 10 reps - 5 hold - Seated Long Arc Quad with Ankle Weight  - 1 x daily - 7 x weekly - 2 sets - 10 reps - 5 hold - Seated March  - 2 x daily -  7 x weekly - 3 sets - 10 reps - Sit to Stand Without Arm Support  - 2 x daily - 7 x weekly - 2 sets - 5-10 reps - Standing Tandem Balance with Counter Support  - 1 x daily - 7 x weekly - 5 reps - 30-60 hold - Standing Single Leg Stance with Counter Support  - 1 x daily - 7 x weekly - 1 sets - 5 reps - 30-60  hold - Standing Heel Raise with Support  - 1 x daily - 7 x weekly - 3 sets - 10 reps - 3 hold - Runner's Climb  - 2 x daily - 7 x weekly - 2-3 sets - 10 reps - 3-5 hold - Seated Hamstring Stretch  - 1 x daily - 7 x weekly - 1 sets - 3 reps - 20 hold - Standing Hip Flexor Stretch  - 2 x daily - 7 x weekly - 1 sets - 3 reps - 20 hold  ASSESSMENT: CLINICAL IMPRESSION: Pt reports that she has been walking without her walker in the house.  She uses walker for community distances.  She is more independent with housework and doesn't fatigue as quickly.  Pt still experiences pain that limits her mobility and sleep. Pt was able to advanced to resisted walking today with CGA for safety and increased her resistance on NuStep.  Will continue to advance to wean from walker for community distances and gain independence for return to the gym.  PT monitored throughout session for fatigue, technique and safety.  Patient will benefit from skilled PT to address the below impairments and improve overall function.   OBJECTIVE IMPAIRMENTS: Abnormal gait, decreased activity tolerance, decreased balance, decreased mobility, difficulty walking, decreased ROM, decreased strength, hypomobility, increased fascial restrictions, increased muscle spasms, impaired flexibility, and pain.   ACTIVITY LIMITATIONS: carrying, lifting, standing, squatting, stairs, transfers, dressing, hygiene/grooming, locomotion level, and caring for others  PARTICIPATION LIMITATIONS: meal prep, cleaning, laundry, driving, shopping, community activity, and church  PERSONAL FACTORS: Age, Time since onset of injury/illness/exacerbation, and 1 comorbidity: breast cancer  are also affecting patient's functional outcome.   REHAB POTENTIAL: Good  CLINICAL DECISION MAKING: Evolving/moderate complexity  EVALUATION COMPLEXITY: Moderate   GOALS: Goals reviewed with patient? Yes  SHORT TERM GOALS: Target date: 03/20/2024   Be independent in initial  HEP Baseline: 03/04/24 - Pt is independent with initial HEP Goal status: MET  2.  Wean from walker to cane for household distances due to increased strength  Baseline: 03/04/24 - pt still relies on walker ~ 70% of time, other times relies on counter or other things nearby; 03/13/24 - pt reports mostly walks around house without AD now Goal status: MET  3.  Perform sit to stand transition without UE support with neutral hips due to improved functional strength  Baseline: 03/04/24 - increased to 7 reps with 2/7 no UE support; 03/13/24 - with controlled motion pt able to perform correctly Goal status: MET  4.  Improve 6 min walk test to > or = to 750 feet to improve community distance  Baseline: 552 feet; 03/04/24 - 808 ft with walker Goal status: MET  5.  Report > or = to 30% reduction in Rt hip pain with daily tasks  Baseline: 03/04/24 - 30% with pain meds Goal status: MET    LONG TERM GOALS: Target date: 04/24/2024  Be independent in advanced HEP Baseline:  Goal status: ONGOING  2.  Improve 5x sit to stand to <  or = to 14 seconds to reduce falls risk  Baseline: 19.30 seconds with UE support; 03/04/24 - 30 sec x 7 reps with/without UE support; 03/13/24 - 5 STS in 13 sec Goal status: MET  3.  Improve 6 min walk test to > or = to 900 feet to improve community ambulation  Baseline: 552 feet; 03/04/24 - 808 ft  Goal status: INITIAL  4.  Wean from walker to cane in the community due to improved balance and safety Baseline: not using any device at home (03/25/24) Goal status: ONGOING  5.  Repot > or = to 60% reduction in Rt LE pain to improve tolerance for housework  Baseline: 03/04/24 - 30% with pain meds at night and baclofen  Goal status: ONGOING   PLAN:  PT FREQUENCY: 2x/week  PT DURATION: 8 weeks  PLANNED INTERVENTIONS: 97110-Therapeutic exercises, 97530- Therapeutic activity, 97112- Neuromuscular re-education, 97535- Self Care, 02859- Manual therapy, [phone]- Canalith  repositioning, [id number]- Aquatic Therapy, [id number]- Electrical stimulation (unattended), 20560 (1-2 muscles), 20561 (3+ muscles)- Dry Needling, Patient/Family education, Balance training, Stair training, Taping, Joint mobilization, Scar mobilization, Vestibular training, Cryotherapy, and Moist heat  PLAN FOR NEXT SESSION: Cont to progress HEP prn; Cont NuStep, cont gait with SPC,  work on functional tasks and mobility, cont high level balance tasks including stepping over obstacles as SLS is challenging and lifting Rt knee  Burnard Joy, PT 03/25/24 1:24 PM    [facility] [address] [place], [place] [address] Phone # [phone] Fax [phone]

## 2024-03-27 ENCOUNTER — Ambulatory Visit

## 2024-03-27 DIAGNOSIS — M79604 Pain in right leg: Secondary | ICD-10-CM

## 2024-03-27 DIAGNOSIS — R2689 Other abnormalities of gait and mobility: Secondary | ICD-10-CM

## 2024-03-27 DIAGNOSIS — M6281 Muscle weakness (generalized): Secondary | ICD-10-CM

## 2024-03-27 DIAGNOSIS — R293 Abnormal posture: Secondary | ICD-10-CM

## 2024-03-27 NOTE — Therapy (Signed)
 OUTPATIENT PHYSICAL THERAPY LOWER EXTREMITY TREATMENT   Patient Name: Marie Day MRN: [id number] DOB:04-Jan-1946, 78 y.o., female Today's Date: 03/27/2024     END OF SESSION:  PT End of Session - 03/27/24 1209     Visit Number 13    Number of Visits 16    Date for Recertification  04/24/24    Authorization Type [employer]-no auth required    Progress Note Due on Visit 20    PT Start Time 1204    PT Stop Time 1259    PT Time Calculation (min) 55 min    Activity Tolerance Patient tolerated treatment well    Behavior During Therapy WFL for tasks assessed/performed            Past Medical History:  Diagnosis Date   Arthritis    Breast cancer (HCC)    Cancer (HCC)    Past Surgical History:  Procedure Laterality Date   BREAST BIOPSY Right 01/30/2023   US  [name] BX W LOC DEV 1ST LESION IMG BX SPEC US  GUIDE 01/30/2023 [facility]   BREAST RECONSTRUCTION WITH PLACEMENT OF TISSUE EXPANDER AND FLEX HD (ACELLULAR HYDRATED DERMIS) Right 06/29/2021   Procedure: RIGHT BREAST RECONSTRUCTION WITH PLACEMENT OF TISSUE EXPANDER AND FLEX HD (ACELLULAR HYDRATED DERMIS);  Surgeon: Elisabeth Craig RAMAN, MD;  Location: [facility];  Service: Plastics;  Laterality: Right;   BREAST REDUCTION WITH MASTOPEXY Left 06/30/2022   Procedure: LEFT BREAST REDUCTION WITH MASTOPEXY;  Surgeon: Lowery Estefana RAMAN, DO;  Location: [facility];  Service: Plastics;  Laterality: Left;   INTRAMEDULLARY (IM) NAIL INTERTROCHANTERIC Right 12/10/2023   Procedure: FIXATION, FRACTURE, INTERTROCHANTERIC, WITH INTRAMEDULLARY ROD;  Surgeon: Georgina Ozell LABOR, MD;  Location: [facility];  Service: Orthopedics;  Laterality: Right;   MASTECTOMY Right 06/29/2021   MASTECTOMY W/ SENTINEL NODE BIOPSY Right 06/29/2021   Procedure: RIGHT MASTECTOMY WITH SENTINEL LYMPH NODE BIOPSY;  Surgeon: Vanderbilt Ned, MD;  Location: [facility];  Service: General;  Laterality: Right;   PORT-A-CATH  REMOVAL Left 06/30/2022   Procedure: REMOVAL PORT-A-CATH;  Surgeon: Lowery Estefana RAMAN, DO;  Location: [facility];  Service: Plastics;  Laterality: Left;   REMOVAL OF TISSUE EXPANDER AND PLACEMENT OF IMPLANT Right 06/30/2022   Procedure: REMOVAL OF TISSUE EXPANDER AND PLACEMENT OF IMPLANT;  Surgeon: Lowery Estefana RAMAN, DO;  Location: [facility];  Service: Plastics;  Laterality: Right;   WISDOM TOOTH EXTRACTION     Patient Active Problem List   Diagnosis Date Noted   Osteoporosis 01/01/2024   Elevated antinuclear antibody (ANA) level 09/27/2023   Insomnia 09/18/2023   Dyslipidemia 03/24/2023   Body mass index (BMI) 24.0-24.9, adult 02/28/2023   Joint swelling 02/28/2023   Other specified abnormal immunological findings in serum 02/28/2023   Pain in limb 02/28/2023   Tinnitus 12/21/2022   Breast cancer (HCC) 12/21/2022   Joint stiffness 09/01/2022   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   Polyarthralgia 02/08/2022   S/P breast reconstruction 06/29/2021   Malignant neoplasm of upper-outer quadrant of right breast in female, estrogen receptor negative (HCC) 12/21/2020   Anemia 10/27/2020   Prediabetes 10/27/2020   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   Unspecified age-related cataract 12/14/2015    PCP: Kennyth Bart, MD  REFERRING PROVIDER: Georgina Ozell, MD  REFERRING DIAG:  Diagnosis  S72.141G (ICD-10-CM) - Displaced intertrochanteric fracture of right femur, subsequent encounter for closed fracture with delayed healing    THERAPY DIAG:  Muscle weakness (  generalized)  Other abnormalities of gait and mobility  Pain in right leg  Abnormal posture  Rationale for Evaluation and Treatment: Rehabilitation  ONSET DATE: 12/10/23- fall with femur fracture   SUBJECTIVE:   SUBJECTIVE STATEMENT: I haven't had a pain pill since Monday night. But my Rt hip pain has been okay, only about 3-4/10, in the early  morning hours so that's improved.   From MD note 01/25/24:  Patient states that she is doing well at this point. She is ambulating with a walker. She is able to ambulate up and down stairs as well. She is not having any consistent pain in the hip. She is sparingly using hydrocodone  to control her pain when she does have it. She has not noticed any redness or drainage around her incisions.   PERTINENT HISTORY: Malignant neoplasm of upper-outer quadrant of right breast in female, estrogen receptor negative (HCC) 2023, IM rod placement  PAIN: 03/18/24 PAIN:  Are you having pain? No, just achy in the Rt hip  PRECAUTIONS: Other: breast cancer and mastectomy   RED FLAGS: None   WEIGHT BEARING RESTRICTIONS: No WBAT per MD   FALLS:  Has patient fallen in last 6 months? Yes. Number of falls 1 on 12/10/23 with IM rod placement on Rt  LIVING ENVIRONMENT: Lives with: lives with their family Lives in: House/apartment Stairs: Yes: Internal: 4 steps; on right going up Has following equipment at home: [name] - 2 wheeled  PLOF: Independent and Leisure: none  PATIENT GOALS: improve mobility, walk without walker  NEXT MD VISIT: 03/31/24  OBJECTIVE:  Note: Objective measures were completed at Evaluation unless otherwise noted.   COGNITION: Overall cognitive status: Within functional limits for tasks assessed     SENSATION: WFL   POSTURE: rounded shoulders, forward head, and weight shift left  PALPATION: Tender over incisions in the Rt anterior and lateral thigh  LOWER EXTREMITY ROM: WFLs   LOWER EXTREMITY MMT: Rt hip 4-/5, Lt 4+/5, bil knees 4+/5   FUNCTIONAL TESTS:  02/14/24 5 times sit to stand:with UE support 19.30 seconds  6 minute walk test: 552 feet with walker with 4/10 RPE -age related norm is 1250 feet  03/04/24:  7 times sit to stand (30 sec): 2 without UE support/ then remaining with UE support 6 minute walk test: 808 ft with walker with 2-3/10  RPE   GAIT: Distance walked: 50 Assistive device utilized: [employer] - 2 wheeled Level of assistance: Modified independence Comments: slow mobility, reduced step length                                                                                                                             TREATMENT DATE:  03/27/24: Therapeutic Exercises NuStep level 8 x 10 mins; UE 8/LE 9, 501 steps Leg press 80#, 2 x 20 bil; then Rt 35# and Lt 45# 2 x 20 each leg; discussed with pt during this about getting a SPC and she is going  to look into getting one of these today Hip Machine for hip 3 way raises into bil flex, abd and then ext 15# x 20 each, pt with good technique today and reports less Rt hip discomfort than she's been having with these exercise Seated hamstring stretch 2x20 seconds bil at end of session Neuro Re Ed  Resisted walking: 10# forward and reverse x 8 each with CGA-min A initially, was able to transition to SBA by end of reps  03/25/24:  Therapeutic Exercises NuStep level 8 x 10 mins; UE 8/LE 9, 526 steps Leg press 80#, 2 x 20 bil, 35# Rt, 2 x 20, then second set Lt 45# 2 x 20 each leg Seated hamstring stretch 3x20 seconds bil  Neuro Re Ed  Discussion regarding use of cane for shorter community distances and walker for longer distances Resisted walking: 10# forward and reverse x 8 each  Sit to stand with yellow loop around thighs 2x10  03/20/24: Therapeutic Exercises NuStep level 7 x 10 mins; UE 8/LE 9, 715 steps Leg press 80#, 2 x 20 bil, 35# Rt (felt a little weaker today), then second set 30# and Lt 40# 2 x 20 each leg Hip Machine for hip 3 way raises into bil flex, abd and then ext 15# x 20 each Seated hamstring stretch 3x20 seconds bil  Neuro Re Ed  Standing on upside down bosu for squats with +2 UE support 2 x 10 Continued with focus on walking with SPC x 250 ft around clinic Stepping over orange obstacles with 1# added to each ankle at counter and with SPC: forward x 4  laps and then bil sidestepping x 2 each   03/18/24: Therapeutic Exercises NuStep level 7 x 10 mins; UE 8/LE 9-PT present to discuss progress and cueing to reduce hip adduction Leg press 80#, 2 x 20 bil, 40# Rt and Lt 40# 2x10 bil each  Seated hamstring stretch 3x20 seconds bil  Neuro Re Ed Practiced transitioning to SPC x 375 ft. Pt practiced 3 point vs 2 point pattern and did well with 2 point pattern after demo and VC's. She was also able to perform with step through pattern without difficulty. Then continued to use [facility] for rest of session ambulating throughout clinic.  Standing in corner on purple airex for ball toss at varying heights x 3 mins In // bars for all following: Front and retro tandem walking on airex beam x 5 laps each, encouraged VC's for fingertip support  Step to gait over hurdles: forward and lateral x 3 laps each with min UE support at barre  Therapeutic Activities Hip abduction and extension standing on balance pad 2x10 bil with min UE support at barre  Mini squats on purple airex x 20     PATIENT EDUCATION:  Education details: Access Code: [email or web address] Person educated: Patient Education method: Explanation, Demonstration, and Handouts Education comprehension: verbalized understanding and returned demonstration  HOME EXERCISE PROGRAM: Access Code: [email or web address] (use new access code) URL: [email or web address] Date: 02/14/2024 Prepared by: Burnard  Exercises - Seated Hamstring Stretch  - 2 x daily - 7 x weekly - 1 sets - 3 reps - 20 hold - Sit to Stand Without Arm Support  - 2 x daily - 7 x weekly - 2 sets - 5-10 reps - Seated March  - 2 x daily - 7 x weekly - 3 sets - 10 reps - Seated Long Arc Quad  - 2 x daily - 7 x weekly - 2 sets - 10  reps - 5 hold  Access Code: [email or web address] URL: [email or web address] Date: 02/26/2024 Prepared by: Berwyn Knights  Exercises - Standing Hip Flexion with Resistance (Mirrored)  - 1 x daily - 7 x weekly  - 1 sets - 10 reps - 3 hold - Standing Hip Extension with Resistance  - 1 x daily - 7 x weekly - 1 sets - 10 reps - 3 hold - Standing Hip Abduction with Theraband Resistance  - 1 x daily - 7 x weekly - 1 sets - 10 reps - 3 hold - Supine Bridge  - 1 x daily - 7 x weekly - 2 sets - 10 reps - 5 hold - Seated Long Arc Quad with Ankle Weight  - 1 x daily - 7 x weekly - 2 sets - 10 reps - 5 hold - Seated March  - 2 x daily - 7 x weekly - 3 sets - 10 reps - Sit to Stand Without Arm Support  - 2 x daily - 7 x weekly - 2 sets - 5-10 reps - Standing Tandem Balance with Counter Support  - 1 x daily - 7 x weekly - 5 reps - 30-60 hold - Standing Single Leg Stance with Counter Support  - 1 x daily - 7 x weekly - 1 sets - 5 reps - 30-60 hold - Standing Heel Raise with Support  - 1 x daily - 7 x weekly - 3 sets - 10 reps - 3 hold - Runner's Climb  - 2 x daily - 7 x weekly - 2-3 sets - 10 reps - 3-5 hold - Seated Hamstring Stretch  - 1 x daily - 7 x weekly - 1 sets - 3 reps - 20 hold - Standing Hip Flexor Stretch  - 2 x daily - 7 x weekly - 1 sets - 3 reps - 20 hold  ASSESSMENT: CLINICAL IMPRESSION: Continued with progression with resisted walking and had pt use SPC for the duration of session with walking around clinic. She reports is going to look into getting one of these today as she is feeling increased safety and independence now when she uses this. Also continued with focus on Lt hip functional strength. Pt is noticing some reduction of pain as she hasn't had a pain pill since Mon night and Rt hip pain in early morning hours that had been waking her up at a 5-6/10 but last few mornings was only 3-4/10.   OBJECTIVE IMPAIRMENTS: Abnormal gait, decreased activity tolerance, decreased balance, decreased mobility, difficulty walking, decreased ROM, decreased strength, hypomobility, increased fascial restrictions, increased muscle spasms, impaired flexibility, and pain.   ACTIVITY LIMITATIONS: carrying, lifting,  standing, squatting, stairs, transfers, dressing, hygiene/grooming, locomotion level, and caring for others  PARTICIPATION LIMITATIONS: meal prep, cleaning, laundry, driving, shopping, community activity, and church  PERSONAL FACTORS: Age, Time since onset of injury/illness/exacerbation, and 1 comorbidity: breast cancer  are also affecting patient's functional outcome.   REHAB POTENTIAL: Good  CLINICAL DECISION MAKING: Evolving/moderate complexity  EVALUATION COMPLEXITY: Moderate   GOALS: Goals reviewed with patient? Yes  SHORT TERM GOALS: Target date: 03/20/2024   Be independent in initial HEP Baseline: 03/04/24 - Pt is independent with initial HEP Goal status: MET  2.  Wean from walker to cane for household distances due to increased strength  Baseline: 03/04/24 - pt still relies on walker ~ 70% of time, other times relies on counter or other things nearby; 03/13/24 - pt reports mostly walks around house without AD now Goal status: MET  3.  Perform sit to stand transition without UE support with neutral hips due to improved functional strength  Baseline: 03/04/24 - increased to 7 reps with 2/7 no UE support; 03/13/24 - with controlled motion pt able to perform correctly Goal status: MET  4.  Improve 6 min walk test to > or = to 750 feet to improve community distance  Baseline: 552 feet; 03/04/24 - 808 ft with walker Goal status: MET  5.  Report > or = to 30% reduction in Rt hip pain with daily tasks  Baseline: 03/04/24 - 30% with pain meds Goal status: MET    LONG TERM GOALS: Target date: 04/24/2024  Be independent in advanced HEP Baseline:  Goal status: ONGOING  2.  Improve 5x sit to stand to < or = to 14 seconds to reduce falls risk  Baseline: 19.30 seconds with UE support; 03/04/24 - 30 sec x 7 reps with/without UE support; 03/13/24 - 5 STS in 13 sec Goal status: MET  3.  Improve 6 min walk test to > or = to 900 feet to improve community ambulation  Baseline:  552 feet; 03/04/24 - 808 ft;  Goal status: INITIAL  4.  Wean from walker to cane in the community due to improved balance and safety Baseline: not using any device at home (03/25/24) Goal status: ONGOING  5.  Repot > or = to 60% reduction in Rt LE pain to improve tolerance for housework  Baseline: 03/04/24 - 30% with pain meds at night and baclofen  Goal status: ONGOING   PLAN:  PT FREQUENCY: 2x/week  PT DURATION: 8 weeks  PLANNED INTERVENTIONS: 97110-Therapeutic exercises, 97530- Therapeutic activity, 97112- Neuromuscular re-education, 97535- Self Care, [id number]- Manual therapy, [phone]- Canalith repositioning, [id number]- Aquatic Therapy, [id number]- Electrical stimulation (unattended), 20560 (1-2 muscles), 20561 (3+ muscles)- Dry Needling, Patient/Family education, Balance training, Stair training, Taping, Joint mobilization, Scar mobilization, Vestibular training, Cryotherapy, and Moist heat  PLAN FOR NEXT SESSION: Cont NuStep, cont gait with SPC,  work on functional tasks and mobility, cont high level balance tasks including stepping over obstacles as SLS is challenging and lifting Rt knee; Cont to progress HEP prn  Berwyn Knights, PTA 03/27/24 1:12 PM   [facility] [address], [address] [place], [place] [address] Phone # [phone] Fax [phone]

## 2024-04-01 ENCOUNTER — Encounter

## 2024-04-01 ENCOUNTER — Encounter: Payer: Self-pay | Admitting: Physical Therapy

## 2024-04-01 ENCOUNTER — Ambulatory Visit: Admitting: Physical Therapy

## 2024-04-01 DIAGNOSIS — R2689 Other abnormalities of gait and mobility: Secondary | ICD-10-CM

## 2024-04-01 DIAGNOSIS — M6281 Muscle weakness (generalized): Secondary | ICD-10-CM

## 2024-04-01 DIAGNOSIS — M79604 Pain in right leg: Secondary | ICD-10-CM

## 2024-04-01 NOTE — Therapy (Signed)
 OUTPATIENT PHYSICAL THERAPY LOWER EXTREMITY TREATMENT   Patient Name: Marie Day MRN: [id number] DOB:Oct 19, 1945, 78 y.o., female Today's Date: 04/01/2024     END OF SESSION:  PT End of Session - 04/01/24 0906     Visit Number 14    Number of Visits 16    Date for Recertification  04/24/24    Authorization Type [employer]-no auth required    PT Start Time 0904    PT Stop Time 1001    PT Time Calculation (min) 57 min    Activity Tolerance Patient tolerated treatment well    Behavior During Therapy [facility] for tasks assessed/performed            Past Medical History:  Diagnosis Date   Arthritis    Breast cancer (HCC)    Cancer (HCC)    Past Surgical History:  Procedure Laterality Date   BREAST BIOPSY Right 01/30/2023   US  [name] BX W LOC DEV 1ST LESION IMG BX SPEC US  GUIDE 01/30/2023 [facility]AMMOGRAP[facility]   BREAST RECONSTRUCTION WITH PLACEMENT OF TISSUE EXPANDER AND FLEX HD (ACELLULAR HYDRATED DERMIS) Right 06/29/2021   Procedure: RIGHT BREAST RECONSTRUCTION WITH PLACEMENT OF TISSUE EXPANDER AND FLEX HD (ACELLULAR HYDRATED DERMIS);  Surgeon: Elisabeth Craig RAMAN, MD;  Location: [facility];  Service: Plastics;  Laterality: Right;   BREAST REDUCTION WITH MASTOPEXY Left 06/30/2022   Procedure: LEFT BREAST REDUCTION WITH MASTOPEXY;  Surgeon: Lowery Estefana RAMAN, DO;  Location: [facility];  Service: Plastics;  Laterality: Left;   INTRAMEDULLARY (IM) NAIL INTERTROCHANTERIC Right 12/10/2023   Procedure: FIXATION, FRACTURE, INTERTROCHANTERIC, WITH INTRAMEDULLARY ROD;  Surgeon: Georgina Ozell LABOR, MD;  Location: [facility];  Service: Orthopedics;  Laterality: Right;   MASTECTOMY Right 06/29/2021   MASTECTOMY W/ SENTINEL NODE BIOPSY Right 06/29/2021   Procedure: RIGHT MASTECTOMY WITH SENTINEL LYMPH NODE BIOPSY;  Surgeon: Vanderbilt Ned, MD;  Location: [facility];  Service: General;  Laterality: Right;   PORT-A-CATH REMOVAL Left 06/30/2022   Procedure:  REMOVAL PORT-A-CATH;  Surgeon: Lowery Estefana RAMAN, DO;  Location: [facility];  Service: Plastics;  Laterality: Left;   REMOVAL OF TISSUE EXPANDER AND PLACEMENT OF IMPLANT Right 06/30/2022   Procedure: REMOVAL OF TISSUE EXPANDER AND PLACEMENT OF IMPLANT;  Surgeon: Lowery Estefana RAMAN, DO;  Location: [facility];  Service: Plastics;  Laterality: Right;   WISDOM TOOTH EXTRACTION     Patient Active Problem List   Diagnosis Date Noted   Osteoporosis 01/01/2024   Elevated antinuclear antibody (ANA) level 09/27/2023   Insomnia 09/18/2023   Dyslipidemia 03/24/2023   Body mass index (BMI) 24.0-24.9, adult 02/28/2023   Joint swelling 02/28/2023   Other specified abnormal immunological findings in serum 02/28/2023   Pain in limb 02/28/2023   Tinnitus 12/21/2022   Breast cancer (HCC) 12/21/2022   Joint stiffness 09/01/2022   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   Polyarthralgia 02/08/2022   S/P breast reconstruction 06/29/2021   Malignant neoplasm of upper-outer quadrant of right breast in female, estrogen receptor negative (HCC) 12/21/2020   Anemia 10/27/2020   Prediabetes 10/27/2020   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   Unspecified age-related cataract 12/14/2015    PCP: Kennyth Bart, MD  REFERRING PROVIDER: Georgina Ozell, MD  REFERRING DIAG:  Diagnosis  S72.141G (ICD-10-CM) - Displaced intertrochanteric fracture of right femur, subsequent encounter for closed fracture with delayed healing    THERAPY DIAG:  Muscle weakness (generalized)  Other abnormalities of gait and mobility  Pain in right leg  Rationale for Evaluation and Treatment: Rehabilitation  ONSET DATE: 12/10/23- fall with femur fracture   SUBJECTIVE:   SUBJECTIVE STATEMENT: Pain is doing good. Yesterday the pain woke me up and went down to the knee.   From MD note 01/25/24:  Patient states that she is doing well at this point. She is  ambulating with a walker. She is able to ambulate up and down stairs as well. She is not having any consistent pain in the hip. She is sparingly using hydrocodone  to control her pain when she does have it. She has not noticed any redness or drainage around her incisions.   PERTINENT HISTORY: Malignant neoplasm of upper-outer quadrant of right breast in female, estrogen receptor negative (HCC) 2023, IM rod placement  PAIN: 03/18/24 PAIN:  Are you having pain? 2/10, R hip, ache, medication helps, being still makes it worse  PRECAUTIONS: Other: breast cancer and mastectomy   RED FLAGS: None   WEIGHT BEARING RESTRICTIONS: No WBAT per MD   FALLS:  Has patient fallen in last 6 months? Yes. Number of falls 1 on 12/10/23 with IM rod placement on Rt  LIVING ENVIRONMENT: Lives with: lives with their family Lives in: House/apartment Stairs: Yes: Internal: 4 steps; on right going up Has following equipment at home: [name] - 2 wheeled  PLOF: Independent and Leisure: none  PATIENT GOALS: improve mobility, walk without walker  NEXT MD VISIT: 03/31/24  OBJECTIVE:  Note: Objective measures were completed at Evaluation unless otherwise noted.   COGNITION: Overall cognitive status: Within functional limits for tasks assessed     SENSATION: WFL   POSTURE: rounded shoulders, forward head, and weight shift left  PALPATION: Tender over incisions in the Rt anterior and lateral thigh  LOWER EXTREMITY ROM: WFLs   LOWER EXTREMITY MMT: Rt hip 4-/5, Lt 4+/5, bil knees 4+/5   FUNCTIONAL TESTS:  02/14/24 5 times sit to stand:with UE support 19.30 seconds  6 minute walk test: 552 feet with walker with 4/10 RPE -age related norm is 1250 feet  03/04/24:  7 times sit to stand (30 sec): 2 without UE support/ then remaining with UE support 6 minute walk test: 808 ft with walker with 2-3/10 RPE   GAIT: Distance walked: 50 Assistive device utilized: Walker - 2 wheeled Level of assistance:  Modified independence Comments: slow mobility, reduced step length                                                                                                                             TREATMENT DATE:  04/01/24: Therapeutic Exercises NuStep level 8 x 10 mins; UE 8/LE 8, 507 steps Leg press 80#, 2 x 20 bil; then Rt 35# and Lt 45# 2 x 20 each leg; discussed with pt during this about getting a SPC and she is going to look into getting one of these today Hip Machine for hip 3 way raises into bil flex, abd and  then ext 15# x 20 each, pt with good technique today and reports less Rt hip discomfort than she's been having with these exercise Seated hamstring stretch 2x20 seconds bil at end of session Neuro Re Ed  Resisted walking: 10# forward (attempted 13# but too difficult) x 8 and reverse with 10# x 2 then 13# x 6 reps with CGA and 1 occasion of min assist with balance  03/27/24: Therapeutic Exercises NuStep level 8 x 10 mins; UE 8/LE 9, 501 steps Leg press 80#, 2 x 20 bil; then Rt 35# and Lt 45# 2 x 20 each leg; v/c and t/c to avoid hip adduction  Hip Machine for hip 3 way raises into bil flex, abd and then ext 15# x 20 each, pt with good technique today with only minimal cueing for form Seated hamstring stretch 2x20 seconds bil at end of session Neuro Re Ed  Resisted walking: 10# forward and reverse x 8 each with CGA-min A initially, was able to transition to SBA by end of reps  03/25/24:  Therapeutic Exercises NuStep level 8 x 10 mins; UE 8/LE 9, 526 steps Leg press 80#, 2 x 20 bil, 35# Rt, 2 x 20, then second set Lt 45# 2 x 20 each leg Seated hamstring stretch 3x20 seconds bil  Neuro Re Ed  Discussion regarding use of cane for shorter community distances and walker for longer distances Resisted walking: 10# forward and reverse x 8 each  Sit to stand with yellow loop around thighs 2x10  03/20/24: Therapeutic Exercises NuStep level 7 x 10 mins; UE 8/LE 9, 715 steps Leg press 80#,  2 x 20 bil, 35# Rt (felt a little weaker today), then second set 30# and Lt 40# 2 x 20 each leg Hip Machine for hip 3 way raises into bil flex, abd and then ext 15# x 20 each Seated hamstring stretch 3x20 seconds bil  Neuro Re Ed  Standing on upside down bosu for squats with +2 UE support 2 x 10 Continued with focus on walking with SPC x 250 ft around clinic Stepping over orange obstacles with 1# added to each ankle at counter and with SPC: forward x 4 laps and then bil sidestepping x 2 each   03/18/24: Therapeutic Exercises NuStep level 7 x 10 mins; UE 8/LE 9-PT present to discuss progress and cueing to reduce hip adduction Leg press 80#, 2 x 20 bil, 40# Rt and Lt 40# 2x10 bil each  Seated hamstring stretch 3x20 seconds bil  Neuro Re Ed Practiced transitioning to SPC x 375 ft. Pt practiced 3 point vs 2 point pattern and did well with 2 point pattern after demo and VC's. She was also able to perform with step through pattern without difficulty. Then continued to use [facility] for rest of session ambulating throughout clinic.  Standing in corner on purple airex for ball toss at varying heights x 3 mins In // bars for all following: Front and retro tandem walking on airex beam x 5 laps each, encouraged VC's for fingertip support  Step to gait over hurdles: forward and lateral x 3 laps each with min UE support at barre  Therapeutic Activities Hip abduction and extension standing on balance pad 2x10 bil with min UE support at barre  Mini squats on purple airex x 20     PATIENT EDUCATION:  Education details: Access Code: [email or web address] Person educated: Patient Education method: Explanation, Demonstration, and Handouts Education comprehension: verbalized understanding and returned demonstration  HOME EXERCISE PROGRAM: Access  Code: [email or web address] (use new access code) URL: [email or web address] Date: 02/14/2024 Prepared by: Burnard  Exercises - Seated Hamstring Stretch  - 2 x daily - 7 x  weekly - 1 sets - 3 reps - 20 hold - Sit to Stand Without Arm Support  - 2 x daily - 7 x weekly - 2 sets - 5-10 reps - Seated March  - 2 x daily - 7 x weekly - 3 sets - 10 reps - Seated Long Arc Quad  - 2 x daily - 7 x weekly - 2 sets - 10 reps - 5 hold  Access Code: [email or web address] URL: [email or web address] Date: 02/26/2024 Prepared by: Berwyn Knights  Exercises - Standing Hip Flexion with Resistance (Mirrored)  - 1 x daily - 7 x weekly - 1 sets - 10 reps - 3 hold - Standing Hip Extension with Resistance  - 1 x daily - 7 x weekly - 1 sets - 10 reps - 3 hold - Standing Hip Abduction with Theraband Resistance  - 1 x daily - 7 x weekly - 1 sets - 10 reps - 3 hold - Supine Bridge  - 1 x daily - 7 x weekly - 2 sets - 10 reps - 5 hold - Seated Long Arc Quad with Ankle Weight  - 1 x daily - 7 x weekly - 2 sets - 10 reps - 5 hold - Seated March  - 2 x daily - 7 x weekly - 3 sets - 10 reps - Sit to Stand Without Arm Support  - 2 x daily - 7 x weekly - 2 sets - 5-10 reps - Standing Tandem Balance with Counter Support  - 1 x daily - 7 x weekly - 5 reps - 30-60 hold - Standing Single Leg Stance with Counter Support  - 1 x daily - 7 x weekly - 1 sets - 5 reps - 30-60 hold - Standing Heel Raise with Support  - 1 x daily - 7 x weekly - 3 sets - 10 reps - 3 hold - Runner's Climb  - 2 x daily - 7 x weekly - 2-3 sets - 10 reps - 3-5 hold - Seated Hamstring Stretch  - 1 x daily - 7 x weekly - 1 sets - 3 reps - 20 hold - Standing Hip Flexor Stretch  - 2 x daily - 7 x weekly - 1 sets - 3 reps - 20 hold  ASSESSMENT: CLINICAL IMPRESSION: Pt continues to present with decreased pain. Her pain is worse after periods of inactivity but improves with activity. Was able to increase resistance of backwards walking today. Attempted to increase resistance with forward walking but pt unable. She has not gotten a single point cane yet and stills feels more stable with walker for ambulation. She had some muscle  soreness at the end of the session in her RLE.   OBJECTIVE IMPAIRMENTS: Abnormal gait, decreased activity tolerance, decreased balance, decreased mobility, difficulty walking, decreased ROM, decreased strength, hypomobility, increased fascial restrictions, increased muscle spasms, impaired flexibility, and pain.   ACTIVITY LIMITATIONS: carrying, lifting, standing, squatting, stairs, transfers, dressing, hygiene/grooming, locomotion level, and caring for others  PARTICIPATION LIMITATIONS: meal prep, cleaning, laundry, driving, shopping, community activity, and church  PERSONAL FACTORS: Age, Time since onset of injury/illness/exacerbation, and 1 comorbidity: breast cancer  are also affecting patient's functional outcome.   REHAB POTENTIAL: Good  CLINICAL DECISION MAKING: Evolving/moderate complexity  EVALUATION COMPLEXITY: Moderate   GOALS: Goals reviewed with patient? Yes  SHORT TERM GOALS: Target date:  03/20/2024   Be independent in initial HEP Baseline: 03/04/24 - Pt is independent with initial HEP Goal status: MET  2.  Wean from walker to cane for household distances due to increased strength  Baseline: 03/04/24 - pt still relies on walker ~ 70% of time, other times relies on counter or other things nearby; 03/13/24 - pt reports mostly walks around house without AD now Goal status: MET  3.  Perform sit to stand transition without UE support with neutral hips due to improved functional strength  Baseline: 03/04/24 - increased to 7 reps with 2/7 no UE support; 03/13/24 - with controlled motion pt able to perform correctly Goal status: MET  4.  Improve 6 min walk test to > or = to 750 feet to improve community distance  Baseline: 552 feet; 03/04/24 - 808 ft with walker Goal status: MET  5.  Report > or = to 30% reduction in Rt hip pain with daily tasks  Baseline: 03/04/24 - 30% with pain meds Goal status: MET    LONG TERM GOALS: Target date: 04/24/2024  Be independent in  advanced HEP Baseline:  Goal status: ONGOING  2.  Improve 5x sit to stand to < or = to 14 seconds to reduce falls risk  Baseline: 19.30 seconds with UE support; 03/04/24 - 30 sec x 7 reps with/without UE support; 03/13/24 - 5 STS in 13 sec Goal status: MET  3.  Improve 6 min walk test to > or = to 900 feet to improve community ambulation  Baseline: 552 feet; 03/04/24 - 808 ft;  Goal status: INITIAL  4.  Wean from walker to cane in the community due to improved balance and safety Baseline: not using any device at home (03/25/24) Goal status: ONGOING  5.  Repot > or = to 60% reduction in Rt LE pain to improve tolerance for housework  Baseline: 03/04/24 - 30% with pain meds at night and baclofen  Goal status: ONGOING   PLAN:  PT FREQUENCY: 2x/week  PT DURATION: 8 weeks  PLANNED INTERVENTIONS: 97110-Therapeutic exercises, 97530- Therapeutic activity, 97112- Neuromuscular re-education, 97535- Self Care, 02859- Manual therapy, [id number]- Canalith repositioning, [id number]- Aquatic Therapy, [id number]- Electrical stimulation (unattended), 20560 (1-2 muscles), 20561 (3+ muscles)- Dry Needling, Patient/Family education, Balance training, Stair training, Taping, Joint mobilization, Scar mobilization, Vestibular training, Cryotherapy, and Moist heat  PLAN FOR NEXT SESSION: Cont NuStep, cont gait with SPC,  work on functional tasks and mobility, cont high level balance tasks including stepping over obstacles as SLS is challenging and lifting Rt knee; Cont to progress HEP prn  Florina Lanis Carbon, PT 04/01/24 10:04 AM    [facility] [address], [address] [place], [place] [address] Phone # [phone] Fax [phone]

## 2024-04-03 ENCOUNTER — Ambulatory Visit

## 2024-04-03 DIAGNOSIS — M6281 Muscle weakness (generalized): Secondary | ICD-10-CM

## 2024-04-03 DIAGNOSIS — R293 Abnormal posture: Secondary | ICD-10-CM

## 2024-04-03 DIAGNOSIS — M79604 Pain in right leg: Secondary | ICD-10-CM

## 2024-04-03 DIAGNOSIS — R2689 Other abnormalities of gait and mobility: Secondary | ICD-10-CM

## 2024-04-03 NOTE — Therapy (Signed)
 OUTPATIENT PHYSICAL THERAPY LOWER EXTREMITY TREATMENT   Patient Name: Marie Day MRN: [id number] DOB:[birth year], 78 y.o., female Today's Date: 04/03/2024     END OF SESSION:  PT End of Session - 04/03/24 1313     Visit Number 15    Date for Recertification  04/24/24    Authorization Type [employer]-no auth required    Progress Note Due on Visit 20    PT Start Time 1224    PT Stop Time 1313    PT Time Calculation (min) 49 min    Activity Tolerance Patient tolerated treatment well    Behavior During Therapy WFL for tasks assessed/performed             Past Medical History:  Diagnosis Date   Arthritis    Breast cancer (HCC)    Cancer (HCC)    Past Surgical History:  Procedure Laterality Date   BREAST BIOPSY Right 01/30/2023   US  [name] BX W LOC DEV 1ST LESION IMG BX SPEC US  GUIDE 01/30/2023 [facility]   BREAST RECONSTRUCTION WITH PLACEMENT OF TISSUE EXPANDER AND FLEX HD (ACELLULAR HYDRATED DERMIS) Right 06/29/2021   Procedure: RIGHT BREAST RECONSTRUCTION WITH PLACEMENT OF TISSUE EXPANDER AND FLEX HD (ACELLULAR HYDRATED DERMIS);  Surgeon: Elisabeth Craig RAMAN, MD;  Location: [facility];  Service: Plastics;  Laterality: Right;   BREAST REDUCTION WITH MASTOPEXY Left 06/30/2022   Procedure: LEFT BREAST REDUCTION WITH MASTOPEXY;  Surgeon: Lowery Estefana RAMAN, DO;  Location: [facility];  Service: Plastics;  Laterality: Left;   INTRAMEDULLARY (IM) NAIL INTERTROCHANTERIC Right 12/10/2023   Procedure: FIXATION, FRACTURE, INTERTROCHANTERIC, WITH INTRAMEDULLARY ROD;  Surgeon: Georgina Ozell LABOR, MD;  Location: [facility];  Service: Orthopedics;  Laterality: Right;   MASTECTOMY Right 06/29/2021   MASTECTOMY W/ SENTINEL NODE BIOPSY Right 06/29/2021   Procedure: RIGHT MASTECTOMY WITH SENTINEL LYMPH NODE BIOPSY;  Surgeon: Vanderbilt Ned, MD;  Location: [facility];  Service: General;  Laterality: Right;   PORT-A-CATH REMOVAL Left 06/30/2022    Procedure: REMOVAL PORT-A-CATH;  Surgeon: Lowery Estefana RAMAN, DO;  Location: [facility];  Service: Plastics;  Laterality: Left;   REMOVAL OF TISSUE EXPANDER AND PLACEMENT OF IMPLANT Right 06/30/2022   Procedure: REMOVAL OF TISSUE EXPANDER AND PLACEMENT OF IMPLANT;  Surgeon: Lowery Estefana RAMAN, DO;  Location: [facility];  Service: Plastics;  Laterality: Right;   WISDOM TOOTH EXTRACTION     Patient Active Problem List   Diagnosis Date Noted   Osteoporosis 01/01/2024   Elevated antinuclear antibody (ANA) level 09/27/2023   Insomnia 09/18/2023   Dyslipidemia 03/24/2023   Body mass index (BMI) 24.0-24.9, adult 02/28/2023   Joint swelling 02/28/2023   Other specified abnormal immunological findings in serum 02/28/2023   Pain in limb 02/28/2023   Tinnitus 12/21/2022   Breast cancer (HCC) 12/21/2022   Joint stiffness 09/01/2022   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   Polyarthralgia 02/08/2022   S/P breast reconstruction 06/29/2021   Malignant neoplasm of upper-outer quadrant of right breast in female, estrogen receptor negative (HCC) 12/21/2020   Anemia 10/27/2020   Prediabetes 10/27/2020   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  I think I want to start going to the gym.   From MD note 01/25/24:  Patient states that  she is doing well at this point. She is ambulating with a walker. She is able to ambulate up and down stairs as well. She is not having any consistent pain in the hip. She is sparingly using hydrocodone  to control her pain when she does have it. She has not noticed any redness or drainage around her incisions.   PERTINENT HISTORY: Malignant neoplasm of upper-outer quadrant of right breast in female, estrogen receptor negative (HCC) 2023, IM rod placement  PAIN: 03/18/24 PAIN:  Are you having pain? 2/10, R hip, ache, medication helps, being still makes it worse  PRECAUTIONS: Other: breast cancer and mastectomy   RED FLAGS: None   WEIGHT BEARING RESTRICTIONS: No WBAT per MD   FALLS:  Has patient fallen in last 6 months? Yes. Number of falls 1 on 12/10/23 with IM rod placement on Rt  LIVING ENVIRONMENT: Lives with: lives with their family Lives in: House/apartment Stairs: Yes: Internal: 4 steps; on right going up Has following equipment at home: [name] - 2 wheeled  PLOF: Independent and Leisure: none  PATIENT GOALS: improve mobility, walk without walker  NEXT MD VISIT: 03/31/24  OBJECTIVE:  Note: Objective measures were completed at Evaluation unless otherwise noted.   COGNITION: Overall cognitive status: Within functional limits for tasks assessed     SENSATION: WFL   POSTURE: rounded shoulders, forward head, and weight shift left  PALPATION: Tender over incisions in the Rt anterior and lateral thigh  LOWER EXTREMITY ROM: WFLs   LOWER EXTREMITY MMT: Rt hip 4-/5, Lt 4+/5, bil knees 4+/5   FUNCTIONAL TESTS:  02/14/24 5 times sit to stand:with UE support 19.30 seconds  6 minute walk test: 552 feet with walker with 4/10 RPE -age related norm is 1250 feet  03/04/24:  7 times sit to stand (30 sec): 2 without UE support/ then remaining with UE support 6 minute walk test: 808 ft with walker with 2-3/10 RPE   GAIT: Distance walked: 50 Assistive device utilized:  Environmental Consultant - 2 wheeled Level of assistance: Modified independence Comments: slow mobility, reduced step length                                                                                                                             TREATMENT DATE:  04/03/24: Therapeutic Exercises NuStep level 8 x 10 mins; UE 8/LE 8, 531 steps- PT present to discuss progress  Leg press seat 6, 80#, 2 x 20 bil; then Rt 35# and Lt 45# 2 x 20 each leg Hip Machine for hip 3 way raises into bil flex, abd and then  ext 25# x 20 each  Seated hamstring stretch 2x20 seconds bil Neuro Re Ed  Hurdles forward and lateral with min hand hold assistance -3 laps, sidestepping x 3 laps  Alternating step taps: 8 x30 reps  Modified single leg stance with cone tap on mat table 2x10 taps    04/01/24: Therapeutic Exercises NuStep level 8 x 10 mins; UE 8/LE 8, 507 steps Leg press 80#, 2 x 20 bil; then Rt 35# and Lt 45# 2 x 20 each leg; discussed with pt during this about getting a SPC and she is going to look into getting one of these today Hip Machine for hip 3 way raises into bil flex, abd and then ext 15# x 20 each, pt with good technique today and reports less Rt hip discomfort than she's been having with these exercise Seated hamstring stretch 2x20 seconds bil at end of session Neuro Re Ed  Resisted walking: 10# forward (attempted 13# but too difficult) x 8 and reverse with 10# x 2 then 13# x 6 reps with CGA and 1 occasion of min assist with balance  03/27/24: Therapeutic Exercises NuStep level 8 x 10 mins; UE 8/LE 9, 501 steps Leg press 80#, 2 x 20 bil; then Rt 35# and Lt 45# 2 x 20 each leg; v/c and t/c to avoid hip adduction  Hip Machine for hip 3 way raises into bil flex, abd and then ext 15# x 20 each, pt with good technique today with only minimal cueing for form Seated hamstring stretch 2x20 seconds bil at end of session Neuro Re Ed  Resisted walking: 10# forward and reverse x 8 each with CGA-min A initially, was  able to transition to SBA by end of reps  PATIENT EDUCATION:  Education details: Access Code: [email or web address] Person educated: Patient Education method: Explanation, Demonstration, and Handouts Education comprehension: verbalized understanding and returned demonstration  HOME EXERCISE PROGRAM: Access Code: [email or web address] (use new access code), [id number] Access Code: [email or web address] URL: [email or web address] Date: 02/26/2024 Prepared by: Berwyn Knights  Exercises - Standing Hip Flexion with Resistance (Mirrored)  - 1 x daily - 7 x weekly - 1 sets - 10 reps - 3 hold - Standing Hip Extension with Resistance  - 1 x daily - 7 x weekly - 1 sets - 10 reps - 3 hold - Standing Hip Abduction with Theraband Resistance  - 1 x daily - 7 x weekly - 1 sets - 10 reps - 3 hold - Supine Bridge  - 1 x daily - 7 x weekly - 2 sets - 10 reps - 5 hold - Seated Long Arc Quad with Ankle Weight  - 1 x daily - 7 x weekly - 2 sets - 10 reps - 5 hold - Seated March  - 2 x daily - 7 x weekly - 3 sets - 10 reps - Sit to Stand Without Arm Support  - 2 x daily - 7 x weekly - 2 sets - 5-10 reps - Standing Tandem Balance with Counter Support  - 1 x daily - 7 x weekly - 5 reps - 30-60 hold - Standing Single Leg Stance with Counter Support  - 1 x daily - 7 x weekly - 1 sets - 5 reps - 30-60 hold - Standing Heel Raise with Support  - 1 x daily - 7 x weekly - 3 sets - 10 reps - 3 hold - Runner's Climb  - 2 x daily - 7 x weekly - 2-3 sets - 10 reps - 3-5  hold - Seated Hamstring Stretch  - 1 x daily - 7 x weekly - 1 sets - 3 reps - 20 hold - Standing Hip Flexor Stretch  - 2 x daily - 7 x weekly - 1 sets - 3 reps - 20 hold  ASSESSMENT: CLINICAL IMPRESSION: Gait remains antalgic when not using device.  PT encouraged pt to get a cane to improve stability. We worked with cane and PT provided cueing for technique and symmetry.  Good technique with use of cane on the Lt.  Mirror used for feedback.  Pt did well with balance and  single limb exercises with min UE support by PT as needed.  She increased weight with multihip today.  Patient will benefit from skilled PT to address the below impairments and improve overall function.   OBJECTIVE IMPAIRMENTS: Abnormal gait, decreased activity tolerance, decreased balance, decreased mobility, difficulty walking, decreased ROM, decreased strength, hypomobility, increased fascial restrictions, increased muscle spasms, impaired flexibility, and pain.   ACTIVITY LIMITATIONS: carrying, lifting, standing, squatting, stairs, transfers, dressing, hygiene/grooming, locomotion level, and caring for others  PARTICIPATION LIMITATIONS: meal prep, cleaning, laundry, driving, shopping, community activity, and church  PERSONAL FACTORS: Age, Time since onset of injury/illness/exacerbation, and 1 comorbidity: breast cancer  are also affecting patient's functional outcome.   REHAB POTENTIAL: Good  CLINICAL DECISION MAKING: Evolving/moderate complexity  EVALUATION COMPLEXITY: Moderate   GOALS: Goals reviewed with patient? Yes  SHORT TERM GOALS: Target date: 03/20/2024   Be independent in initial HEP Baseline: 03/04/24 - Pt is independent with initial HEP Goal status: MET  2.  Wean from walker to cane for household distances due to increased strength  Baseline: 03/04/24 - pt still relies on walker ~ 70% of time, other times relies on counter or other things nearby; 03/13/24 - pt reports mostly walks around house without AD now Goal status: MET  3.  Perform sit to stand transition without UE support with neutral hips due to improved functional strength  Baseline: 03/04/24 - increased to 7 reps with 2/7 no UE support; 03/13/24 - with controlled motion pt able to perform correctly Goal status: MET  4.  Improve 6 min walk test to > or = to 750 feet to improve community distance  Baseline: 552 feet; 03/04/24 - 808 ft with walker Goal status: MET  5.  Report > or = to 30% reduction in  Rt hip pain with daily tasks  Baseline: 03/04/24 - 30% with pain meds Goal status: MET    LONG TERM GOALS: Target date: 04/24/2024  Be independent in advanced HEP Baseline:  Goal status: ONGOING  2.  Improve 5x sit to stand to < or = to 14 seconds to reduce falls risk  Baseline: 19.30 seconds with UE support; 03/04/24 - 30 sec x 7 reps with/without UE support; 03/13/24 - 5 STS in 13 sec Goal status: MET  3.  Improve 6 min walk test to > or = to 900 feet to improve community ambulation  Baseline: 552 feet; 03/04/24 - 808 ft;  Goal status: INITIAL  4.  Wean from walker to cane in the community due to improved balance and safety Baseline: not using any device at home (03/25/24) Goal status: ONGOING  5.  Repot > or = to 60% reduction in Rt LE pain to improve tolerance for housework  Baseline: 03/04/24 - 30% with pain meds at night and baclofen  Goal status: ONGOING   PLAN:  PT FREQUENCY: 2x/week  PT DURATION: 8 weeks  PLANNED INTERVENTIONS: 97110-Therapeutic  exercises, 97530- Therapeutic activity, [id number]- Neuromuscular re-education, [phone]- Self Care, [id number]- Manual therapy, [phone]- Canalith repositioning, [phone]- Aquatic Therapy, [phone]- Electrical stimulation (unattended), [phone] (1-2 muscles), [id number] (3+ muscles)- Dry Needling, Patient/Family education, Balance training, Stair training, Taping, Joint mobilization, Scar mobilization, Vestibular training, Cryotherapy, and Moist heat  PLAN FOR NEXT SESSION: Cont NuStep, cont gait with SPC,  work on functional tasks and mobility, cont high level balance tasks including stepping over obstacles as SLS is challenging and lifting Rt knee;  sit to stand with staggered stance,  ERO and set new goals- walk to the corner without walker  Burnard Joy, PT 04/03/24 1:14 PM     [facility] [address], [address] [place], [place] [address] Phone # [phone] Fax [phone]

## 2024-04-04 ENCOUNTER — Encounter: Payer: Self-pay | Admitting: Family Medicine

## 2024-04-04 ENCOUNTER — Ambulatory Visit: Admitting: Family Medicine

## 2024-04-04 VITALS — BP 140/70 | HR 73 | Temp 97.5°F | Ht 64.0 in | Wt 169.2 lb

## 2024-04-04 DIAGNOSIS — Z853 Personal history of malignant neoplasm of breast: Secondary | ICD-10-CM

## 2024-04-04 DIAGNOSIS — R030 Elevated blood-pressure reading, without diagnosis of hypertension: Secondary | ICD-10-CM

## 2024-04-04 DIAGNOSIS — M2550 Pain in unspecified joint: Secondary | ICD-10-CM | POA: Diagnosis not present

## 2024-04-04 DIAGNOSIS — G4700 Insomnia, unspecified: Secondary | ICD-10-CM | POA: Diagnosis not present

## 2024-04-04 DIAGNOSIS — R7303 Prediabetes: Secondary | ICD-10-CM | POA: Diagnosis not present

## 2024-04-04 DIAGNOSIS — Z0001 Encounter for general adult medical examination with abnormal findings: Secondary | ICD-10-CM

## 2024-04-04 DIAGNOSIS — M25551 Pain in right hip: Secondary | ICD-10-CM | POA: Diagnosis not present

## 2024-04-04 DIAGNOSIS — E785 Hyperlipidemia, unspecified: Secondary | ICD-10-CM | POA: Diagnosis not present

## 2024-04-04 DIAGNOSIS — M810 Age-related osteoporosis without current pathological fracture: Secondary | ICD-10-CM

## 2024-04-04 DIAGNOSIS — C50919 Malignant neoplasm of unspecified site of unspecified female breast: Secondary | ICD-10-CM

## 2024-04-04 LAB — CBC
HCT: 33.7 % — ABNORMAL LOW (ref 36.0–46.0)
Hemoglobin: 11.5 g/dL — ABNORMAL LOW (ref 12.0–15.0)
MCHC: 34.2 g/dL (ref 30.0–36.0)
MCV: 84.8 fl (ref 78.0–100.0)
Platelets: 253 K/uL (ref 150.0–400.0)
RBC: 3.97 Mil/uL (ref 3.87–5.11)
RDW: 16.8 % — ABNORMAL HIGH (ref 11.5–15.5)
WBC: 6.1 K/uL (ref 4.0–10.5)

## 2024-04-04 LAB — LIPID PANEL
Cholesterol: 236 mg/dL — ABNORMAL HIGH (ref 0–200)
HDL: 69.1 mg/dL (ref >39.00)
LDL Cholesterol: 137 mg/dL — ABNORMAL HIGH (ref 0–99)
NonHDL: 167.12
Total CHOL/HDL Ratio: 3
Triglycerides: 149 mg/dL (ref 0.0–149.0)
VLDL: 29.8 mg/dL (ref 0.0–40.0)

## 2024-04-04 LAB — COMPREHENSIVE METABOLIC PANEL WITH GFR
ALT: 23 U/L (ref 0–35)
AST: 19 U/L (ref 0–37)
Albumin: 4.5 g/dL (ref 3.5–5.2)
Alkaline Phosphatase: 60 U/L (ref 39–117)
BUN: 13 mg/dL (ref 6–23)
CO2: 25 meq/L (ref 19–32)
Calcium: 9.1 mg/dL (ref 8.4–10.5)
Chloride: 105 meq/L (ref 96–112)
Creatinine, Ser: 0.63 mg/dL (ref 0.40–1.20)
GFR: 85.68 mL/min (ref >60.00)
Glucose, Bld: 104 mg/dL — ABNORMAL HIGH (ref 70–99)
Potassium: 3.5 meq/L (ref 3.5–5.1)
Sodium: 140 meq/L (ref 135–145)
Total Bilirubin: 0.4 mg/dL (ref 0.2–1.2)
Total Protein: 6.8 g/dL (ref 6.0–8.3)

## 2024-04-04 LAB — TSH: TSH: 2.23 u[IU]/mL (ref 0.35–5.50)

## 2024-04-04 LAB — HEMOGLOBIN A1C: Hgb A1c MFr Bld: 6.1 % (ref 4.6–6.5)

## 2024-04-04 MED ORDER — GABAPENTIN 300 MG PO CAPS: 300.0000 mg | ORAL_CAPSULE | Freq: Every day | ORAL | 3 refills | Status: AC

## 2024-04-04 NOTE — Assessment & Plan Note (Signed)
 Check lipids with labs.

## 2024-04-04 NOTE — Assessment & Plan Note (Signed)
Check A1c with labs. 

## 2024-04-04 NOTE — Assessment & Plan Note (Signed)
 Doing reasonably well with trazodone  nightly though has had worsening symptoms recently due to her recent hip fracture.  She will continue her current dose of trazodone  as we are adding on gabapentin  as above.

## 2024-04-04 NOTE — Assessment & Plan Note (Signed)
 In remission.  Following with oncology.  She is not happy with outcome of her breast reconstruction so does not wish to pursue any further surgeries at this point.

## 2024-04-04 NOTE — Patient Instructions (Signed)
 It was very nice to see you today!  VISIT SUMMARY: Today, you had your annual physical exam. We discussed your recovery from a recent hip fracture, ongoing pain management, and other health concerns including blood pressure, scar tissue pain, and prediabetes.  YOUR PLAN: PAIN AND INSOMNIA FOLLOWING RIGHT HIP FRACTURE AND SURGERY: You are experiencing chronic pain and insomnia after your right hip fracture and surgery, though the pain is improving. -We discussed and prescribed gabapentin  for nighttime pain management. Please send a message in a few weeks to report on how well gabapentin  is working for you.  ELEVATED BLOOD PRESSURE READING: Your blood pressure was elevated today, likely due to situational factors. -We rechecked your blood pressure before you left. -It is important to monitor your blood pressure at home regularly.  AGE-RELATED OSTEOPOROSIS: You have age-related osteoporosis and recently had a hip fracture. -Continue with physical therapy and stay active to improve long-term outcomes.  PREDIABETES: You have prediabetes. -We ordered an A1c test as part of your blood work.  GENERAL HEALTH MAINTENANCE: Routine wellness visit focused on general health and physical therapy progress. -We ordered blood work including cholesterol and A1c. -Continue with your physical activity and walking.  Return in about 1 year (around 04/04/2025) for Annual Physical.   Take care, Dr Kennyth  PLEASE NOTE:  If you had any lab tests, please let us  know if you have not heard back within a few days. You may see your results on mychart before we have a chance to review them but we will give you a call once they are reviewed by us .   If we ordered any referrals today, please let us  know if you have not heard from their office within the next week.   If you had any urgent prescriptions sent in today, please check with the pharmacy within an hour of our visit to make sure the prescription was  transmitted appropriately.   Please try these tips to maintain a healthy lifestyle:  Eat at least 3 REAL meals and 1-2 snacks per day.  Aim for no more than 5 hours between eating.  If you eat breakfast, please do so within one hour of getting up.   Each meal should contain half fruits/vegetables, one quarter protein, and one quarter carbs (no bigger than a computer mouse)  Cut down on sweet beverages. This includes juice, soda, and sweet tea.   Drink at least 1 glass of water  with each meal and aim for at least 8 glasses per day  Exercise at least 150 minutes every week.     Preventive Care 12 Years and Older, Female Preventive care refers to lifestyle choices and visits with your health care provider that can promote health and wellness. Preventive care visits are also called wellness exams. What can I expect for my preventive care visit? Counseling Your health care provider may ask you questions about your: Medical history, including: Past medical problems. Family medical history. Pregnancy and menstrual history. History of falls. Current health, including: Memory and ability to understand (cognition). Emotional well-being. Home life and relationship well-being. Sexual activity and sexual health. Lifestyle, including: Alcohol, nicotine or tobacco, and drug use. Access to firearms. Diet, exercise, and sleep habits. Work and work astronomer. Sunscreen use. Safety issues such as seatbelt and bike helmet use. Physical exam Your health care provider will check your: Height and weight. These may be used to calculate your BMI (body mass index). BMI is a measurement that tells if you are at a  healthy weight. Waist circumference. This measures the distance around your waistline. This measurement also tells if you are at a healthy weight and may help predict your risk of certain diseases, such as type 2 diabetes and high blood pressure. Heart rate and blood pressure. Body  temperature. Skin for abnormal spots. What immunizations do I need?  Vaccines are usually given at various ages, according to a schedule. Your health care provider will recommend vaccines for you based on your age, medical history, and lifestyle or other factors, such as travel or where you work. What tests do I need? Screening Your health care provider may recommend screening tests for certain conditions. This may include: Lipid and cholesterol levels. Hepatitis C test. Hepatitis B test. HIV (human immunodeficiency virus) test. STI (sexually transmitted infection) testing, if you are at risk. Lung cancer screening. Colorectal cancer screening. Diabetes screening. This is done by checking your blood sugar (glucose) after you have not eaten for a while (fasting). Mammogram. Talk with your health care provider about how often you should have regular mammograms. BRCA-related cancer screening. This may be done if you have a family history of breast, ovarian, tubal, or peritoneal cancers. Bone density scan. This is done to screen for osteoporosis. Talk with your health care provider about your test results, treatment options, and if necessary, the need for more tests. Follow these instructions at home: Eating and drinking  Eat a diet that includes fresh fruits and vegetables, whole grains, lean protein, and low-fat dairy products. Limit your intake of foods with high amounts of sugar, saturated fats, and salt. Take vitamin and mineral supplements as recommended by your health care provider. Do not drink alcohol if your health care provider tells you not to drink. If you drink alcohol: Limit how much you have to 0-1 drink a day. Know how much alcohol is in your drink. In the [place] one drink equals one 12 oz bottle of beer (355 mL), one 5 oz glass of wine (148 mL), or one 1 oz glass of hard liquor (44 mL). Lifestyle Brush your teeth every morning and night with fluoride toothpaste. Floss one  time each day. Exercise for at least 30 minutes 5 or more days each week. Do not use any products that contain nicotine or tobacco. These products include cigarettes, chewing tobacco, and vaping devices, such as e-cigarettes. If you need help quitting, ask your health care provider. Do not use drugs. If you are sexually active, practice safe sex. Use a condom or other form of protection in order to prevent STIs. Take aspirin  only as told by your health care provider. Make sure that you understand how much to take and what form to take. Work with your health care provider to find out whether it is safe and beneficial for you to take aspirin  daily. Ask your health care provider if you need to take a cholesterol-lowering medicine (statin). Find healthy ways to manage stress, such as: Meditation, yoga, or listening to music. Journaling. Talking to a trusted person. Spending time with friends and family. Minimize exposure to UV radiation to reduce your risk of skin cancer. Safety Always wear your seat belt while driving or riding in a vehicle. Do not drive: If you have been drinking alcohol. Do not ride with someone who has been drinking. When you are tired or distracted. While texting. If you have been using any mind-altering substances or drugs. Wear a helmet and other protective equipment during sports activities. If you have firearms in your  house, make sure you follow all gun safety procedures. What's next? Visit your health care provider once a year for an annual wellness visit. Ask your health care provider how often you should have your eyes and teeth checked. Stay up to date on all vaccines. This information is not intended to replace advice given to you by your health care provider. Make sure you discuss any questions you have with your health care provider. Document Revised: 11/04/2020 Document Reviewed: 11/04/2020 Elsevier Patient Education  2024 [employer].

## 2024-04-04 NOTE — Assessment & Plan Note (Signed)
 On Fosamax  per oncology.

## 2024-04-04 NOTE — Progress Notes (Signed)
 Chief Complaint:  Marie Day is a 78 y.o. female who presents today for her annual comprehensive physical exam.    Assessment/Plan:  New/Acute Problems: Right Hip Pain  Patient is recovering from recent intertrochanteric fracture.  She does have tramadol  use as needed and has been working with orthopedics.  She has been using over-the-counter meds as needed as well.  We discussed potential treatment options.  We will avoid higher doses of narcotics at this time.  Will start gabapentin  300 mg nightly.  Discussed potential side effects.  She will follow-up with us  in 2 weeks via MyChart and we can adjust as needed  Elevated Blood Pressure Reading  Mildly elevated today.  Typically well-controlled.  She will monitor at home and let us  know if persistently elevated.  Chronic Problems Addressed Today: Prediabetes Check A1c with labs.   Polyarthralgia Secondary to osteoarthritis though is recovering from anterior trochanteric fracture as above.  She is using over-the-counter meds as needed and is working with physical therapy.  We are adding on gabapentin  as above.  Insomnia Doing reasonably well with trazodone  nightly though has had worsening symptoms recently due to her recent hip fracture.  She will continue her current dose of trazodone  as we are adding on gabapentin  as above.  Osteoporosis On Fosamax  per oncology.  Dyslipidemia Check lipids with labs.  Breast cancer (HCC) In remission.  Following with oncology.  She is not happy with outcome of her breast reconstruction so does not wish to pursue any further surgeries at this point.  Preventative Healthcare: Flu shot declined. Check labs today. Follows with oncology for breast cancer screening.   Patient Counseling(The following topics were reviewed and/or handout was given):  -Nutrition: Stressed importance of moderation in sodium/caffeine intake, saturated fat and cholesterol, caloric balance, sufficient intake of fresh  fruits, vegetables, and fiber.  -Stressed the importance of regular exercise.   -Substance Abuse: Discussed cessation/primary prevention of tobacco, alcohol, or other drug use; driving or other dangerous activities under the influence; availability of treatment for abuse.   -Injury prevention: Discussed safety belts, safety helmets, smoke detector, smoking near bedding or upholstery.   -Sexuality: Discussed sexually transmitted diseases, partner selection, use of condoms, avoidance of unintended pregnancy and contraceptive alternatives.   -Dental health: Discussed importance of regular tooth brushing, flossing, and dental visits.  -Health maintenance and immunizations reviewed. Please refer to Health maintenance section.  Return to care in 1 year for next preventative visit.     Subjective:  HPI:  She has no acute complaints today. Patient is here today for her annual physical.  See assessment / plan for status of chronic conditions.  Discussed the use of AI scribe software for clinical note transcription with the patient, who gave verbal consent to proceed.  History of Present Illness Marie Day is a 78 year old female who presents for an annual physical exam following a recent hip fracture and ongoing pain management.  She has been recovering from a hip fracture sustained approximately four months ago. She is now able to ambulate around her house without a walker. She has been participating in physical therapy and is scheduled for a re-evaluation and goal reset next week.  She continues to experience some pain, which she describes as improving. She uses tramadol , with about eight pills remaining, and plans to use them only for significant pain. She also uses Tylenol . Pain occasionally wakes her at night, but it is not severe enough to prevent sleep. She has stopped  using trazodone  due to concerns about interactions with tramadol , but is eager to resume it to improve her sleep  quality.  She mentions having scar tissue from a previous surgery, which remains achy. She performs massages and uses heat inconsistently. She expresses frustration with past surgical outcomes, particularly regarding size discrepancies post-surgery.  Her blood pressure was noted to be elevated today, which she attributes to irritation with her husband earlier. She does not routinely check her blood pressure at home but believes it is usually below 130/80 mmHg.  She has experienced swelling in her legs, particularly noticeable after removing her socks. She attributes this to increased walking and activity, especially since her 45 year old father moved in with her and enjoys walking.       04/04/2024    7:34 AM  Depression screen PHQ 2/9  Decreased Interest 0  Down, Depressed, Hopeless 0  PHQ - 2 Score 0    There are no preventive care reminders to display for this patient.   ROS: Per HPI, otherwise a complete review of systems was negative.   PMH:  The following were reviewed and entered/updated in epic: Past Medical History:  Diagnosis Date   Arthritis    Breast cancer (HCC)    Cancer [facility])    Patient Active Problem List   Diagnosis Date Noted   Osteoporosis 01/01/2024   Elevated antinuclear antibody (ANA) level 09/27/2023   Insomnia 09/18/2023   Dyslipidemia 03/24/2023   Body mass index (BMI) 24.0-24.9, adult 02/28/2023   Joint swelling 02/28/2023   Other specified abnormal immunological findings in serum 02/28/2023   Pain in limb 02/28/2023   Tinnitus 12/21/2022   Breast cancer (HCC) 12/21/2022   Joint stiffness 09/01/2022   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   Polyarthralgia 02/08/2022   S/P breast reconstruction 06/29/2021   Malignant neoplasm of upper-outer quadrant of right breast in female, estrogen receptor negative (HCC) 12/21/2020   Anemia 10/27/2020   Prediabetes 10/27/2020   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    Unspecified age-related cataract 12/14/2015   Past Surgical History:  Procedure Laterality Date   BREAST BIOPSY Right 01/30/2023   US  [name] BX W LOC DEV 1ST LESION IMG BX SPEC US  GUIDE 01/30/2023 [facility]   BREAST RECONSTRUCTION WITH PLACEMENT OF TISSUE EXPANDER AND FLEX HD (ACELLULAR HYDRATED DERMIS) Right 06/29/2021   Procedure: RIGHT BREAST RECONSTRUCTION WITH PLACEMENT OF TISSUE EXPANDER AND FLEX HD (ACELLULAR HYDRATED DERMIS);  Surgeon: Elisabeth Craig RAMAN, MD;  Location: [facility];  Service: Plastics;  Laterality: Right;   BREAST REDUCTION WITH MASTOPEXY Left 06/30/2022   Procedure: LEFT BREAST REDUCTION WITH MASTOPEXY;  Surgeon: Lowery Estefana RAMAN, DO;  Location: [facility];  Service: Plastics;  Laterality: Left;   INTRAMEDULLARY (IM) NAIL INTERTROCHANTERIC Right 12/10/2023   Procedure: FIXATION, FRACTURE, INTERTROCHANTERIC, WITH INTRAMEDULLARY ROD;  Surgeon: Georgina Ozell LABOR, MD;  Location: [facility];  Service: Orthopedics;  Laterality: Right;   MASTECTOMY Right 06/29/2021   MASTECTOMY W/ SENTINEL NODE BIOPSY Right 06/29/2021   Procedure: RIGHT MASTECTOMY WITH SENTINEL LYMPH NODE BIOPSY;  Surgeon: Vanderbilt Ned, MD;  Location: [facility];  Service: General;  Laterality: Right;   PORT-A-CATH REMOVAL Left 06/30/2022   Procedure: REMOVAL PORT-A-CATH;  Surgeon: Lowery Estefana RAMAN, DO;  Location:  [facility];  Service: Plastics;  Laterality: Left;   REMOVAL OF TISSUE EXPANDER AND PLACEMENT OF IMPLANT Right 06/30/2022   Procedure: REMOVAL OF TISSUE EXPANDER AND PLACEMENT OF IMPLANT;  Surgeon: Lowery Estefana  S, DO;  Location: [facility];  Service: Plastics;  Laterality: Right;   WISDOM TOOTH EXTRACTION      Family History  Problem Relation Age of Onset   Hyperlipidemia Mother    Bone cancer Mother    Hearing loss Mother    Hearing loss Father    Hyperlipidemia Sister    Early death Brother    Birth defects  Brother    Early death Maternal Grandmother    Pancreatic cancer Maternal Grandmother    Cancer Maternal Grandfather    Pancreatic cancer Paternal Grandmother    Cancer Paternal Grandfather    Depression Son    Colon cancer Neg Hx    Breast cancer Neg Hx     Medications- reviewed and updated Current Outpatient Medications  Medication Sig Dispense Refill   alendronate  (FOSAMAX ) 70 MG tablet TAKE 1 TABLET (70 MG TOTAL) BY MOUTH ONCE A WEEK. TAKE WITH A FULL GLASS OF WATER  ON AN EMPTY STOMACH. 12 tablet 3   gabapentin  (NEURONTIN ) 300 MG capsule Take 1 capsule (300 mg total) by mouth at bedtime. 30 capsule 3   Glucosamine-Chondroitin (OSTEO BI-FLEX REGULAR STRENGTH PO) Take 1 Dose by mouth in the morning and at bedtime.     traMADol  (ULTRAM ) 50 MG tablet Take by mouth every 6 (six) hours as needed.     traZODone  (DESYREL ) 50 MG tablet TAKE 0.5-1 TABLETS BY MOUTH AT BEDTIME AS NEEDED FOR SLEEP. 90 tablet 2   UNABLE TO FIND Take 1 Dose by mouth as needed (sleep). CBD Gummies     UNABLE TO FIND Take 2 capsules by mouth in the morning. Fish oil & Tumeric     Vitamin D-Vitamin K (VITAMIN K2-VITAMIN D3 PO) Take 1 tablet by mouth in the morning.     No current facility-administered medications for this visit.    Allergies-reviewed and updated Allergies  Allergen Reactions   Sulfa Antibiotics Hives and Other (See Comments)    Pt reports foggy brain, Rashy/ hives, Red spots on Chest    Social History   Socioeconomic History   Marital status: Married    Spouse name: Not on file   Number of children: Not on file   Years of education: Not on file   Highest education level: Some college, no degree  Occupational History   Not on file  Tobacco Use   Smoking status: Never   Smokeless tobacco: Never  Vaping Use   Vaping status: Never Used  Substance and Sexual Activity   Alcohol use: Not Currently   Drug use: Never   Sexual activity: Not Currently  Other Topics Concern   Not on file   Social History Narrative   Not on file   Social Drivers of Health   Financial Resource Strain: Low Risk  (04/03/2024)   Overall Financial Resource Strain (CARDIA)    Difficulty of Paying Living Expenses: Not hard at all  Food Insecurity: No Food Insecurity (04/03/2024)   Hunger Vital Sign    Worried About Running Out of Food in the Last Year: Never true    Ran Out of Food in the Last Year: Never true  Transportation Needs: No Transportation Needs (04/03/2024)   PRAPARE - Administrator, Civil Service (Medical): No    Lack of Transportation (Non-Medical): No  Physical Activity: Unknown (04/03/2024)   Exercise Vital Sign    Days of Exercise per Week: Patient declined    Minutes of Exercise per Session: Not on file  Stress: No Stress Concern Present (04/03/2024)   [employer] of Occupational Health - Occupational Stress Questionnaire    Feeling of Stress: Only a little  Social Connections: Socially Integrated (04/03/2024)   Social Connection and Isolation Panel    Frequency of Communication with Friends and Family: Twice a week    Frequency of Social Gatherings with Friends and Family: Once a week    Attends Religious Services: More than 4 times per year    Active Member of [employer] or Organizations: Yes    Attends Engineer, Structural: More than 4 times per year    Marital Status: Married        Objective:  Physical Exam: BP (!) 140/70   Pulse 73   Temp (!) 97.5 F (36.4 C) (Temporal)   Ht 5' 4 (1.626 m)   Wt 169 lb 3.2 oz (76.7 kg)   SpO2 97%   BMI 29.04 kg/m   Body mass index is 29.04 kg/m. Wt Readings from Last 3 Encounters:  04/04/24 169 lb 3.2 oz (76.7 kg)  03/11/24 167 lb 6 oz (75.9 kg)  01/01/24 177 lb (80.3 kg)   Gen: NAD, resting comfortably HEENT: TMs normal bilaterally. OP clear. No thyromegaly noted.  CV: RRR with no murmurs appreciated Pulm: NWOB, CTAB with no crackles, wheezes, or rhonchi GI: Normal bowel sounds present.  Soft, Nontender, Nondistended. MSK: no edema, cyanosis, or clubbing noted Skin: warm, dry Neuro: CN2-12 grossly intact. Strength 5/5 in upper and lower extremities. Psych: Normal affect and thought content     Myquan Schaumburg M. Kennyth, MD 04/04/2024 8:13 AM

## 2024-04-04 NOTE — Assessment & Plan Note (Signed)
 Secondary to osteoarthritis though is recovering from anterior trochanteric fracture as above.  She is using over-the-counter meds as needed and is working with physical therapy.  We are adding on gabapentin  as above.

## 2024-04-08 ENCOUNTER — Ambulatory Visit

## 2024-04-08 DIAGNOSIS — M6281 Muscle weakness (generalized): Secondary | ICD-10-CM

## 2024-04-08 DIAGNOSIS — R293 Abnormal posture: Secondary | ICD-10-CM

## 2024-04-08 DIAGNOSIS — M79604 Pain in right leg: Secondary | ICD-10-CM

## 2024-04-08 DIAGNOSIS — R2689 Other abnormalities of gait and mobility: Secondary | ICD-10-CM

## 2024-04-08 NOTE — Therapy (Signed)
 OUTPATIENT PHYSICAL THERAPY LOWER EXTREMITY TREATMENT   Patient Name: Marie Day MRN: [id number] DOB:10-13-1945, 78 y.o., female Today's Date: 04/08/2024     END OF SESSION:  PT End of Session - 04/08/24 1313     Visit Number 16    Date for Recertification  06/03/24    Authorization Type [employer]-no auth required    Progress Note Due on Visit 20    PT Start Time 1231    PT Stop Time 1318    PT Time Calculation (min) 47 min    Activity Tolerance Patient tolerated treatment well    Behavior During Therapy [facility] for tasks assessed/performed              Past Medical History:  Diagnosis Date   Arthritis    Breast cancer (HCC)    Cancer (HCC)    Past Surgical History:  Procedure Laterality Date   BREAST BIOPSY Right 01/30/2023   US  [name] BX W LOC DEV 1ST LESION IMG BX SPEC US  GUIDE 01/30/2023 [facility]   BREAST RECONSTRUCTION WITH PLACEMENT OF TISSUE EXPANDER AND FLEX HD (ACELLULAR HYDRATED DERMIS) Right 06/29/2021   Procedure: RIGHT BREAST RECONSTRUCTION WITH PLACEMENT OF TISSUE EXPANDER AND FLEX HD (ACELLULAR HYDRATED DERMIS);  Surgeon: Elisabeth Craig RAMAN, MD;  Location: [facility];  Service: Plastics;  Laterality: Right;   BREAST REDUCTION WITH MASTOPEXY Left 06/30/2022   Procedure: LEFT BREAST REDUCTION WITH MASTOPEXY;  Surgeon: Lowery Estefana RAMAN, DO;  Location: [facility];  Service: Plastics;  Laterality: Left;   INTRAMEDULLARY (IM) NAIL INTERTROCHANTERIC Right 12/10/2023   Procedure: FIXATION, FRACTURE, INTERTROCHANTERIC, WITH INTRAMEDULLARY ROD;  Surgeon: Georgina Ozell LABOR, MD;  Location: [facility];  Service: Orthopedics;  Laterality: Right;   MASTECTOMY Right 06/29/2021   MASTECTOMY W/ SENTINEL NODE BIOPSY Right 06/29/2021   Procedure: RIGHT MASTECTOMY WITH SENTINEL LYMPH NODE BIOPSY;  Surgeon: Vanderbilt Ned, MD;  Location: [facility];  Service: General;  Laterality: Right;   PORT-A-CATH REMOVAL Left 06/30/2022    Procedure: REMOVAL PORT-A-CATH;  Surgeon: Lowery Estefana RAMAN, DO;  Location: [facility];  Service: Plastics;  Laterality: Left;   REMOVAL OF TISSUE EXPANDER AND PLACEMENT OF IMPLANT Right 06/30/2022   Procedure: REMOVAL OF TISSUE EXPANDER AND PLACEMENT OF IMPLANT;  Surgeon: Lowery Estefana RAMAN, DO;  Location: [facility];  Service: Plastics;  Laterality: Right;   WISDOM TOOTH EXTRACTION     Patient Active Problem List   Diagnosis Date Noted   Osteoporosis 01/01/2024   Elevated antinuclear antibody (ANA) level 09/27/2023   Insomnia 09/18/2023   Dyslipidemia 03/24/2023   Body mass index (BMI) 24.0-24.9, adult 02/28/2023   Joint swelling 02/28/2023   Other specified abnormal immunological findings in serum 02/28/2023   Pain in limb 02/28/2023   Tinnitus 12/21/2022   Breast cancer (HCC) 12/21/2022   Joint stiffness 09/01/2022   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   Polyarthralgia 02/08/2022   S/P breast reconstruction 06/29/2021   Malignant neoplasm of upper-outer quadrant of right breast in female, estrogen receptor negative (HCC) 12/21/2020   Anemia 10/27/2020   Prediabetes 10/27/2020   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   Unspecified age-related cataract 12/14/2015    PCP: Kennyth Bart, MD  REFERRING PROVIDER: Georgina Ozell, MD  REFERRING DIAG:  Diagnosis  S72.141G (ICD-10-CM) - Displaced intertrochanteric fracture of right femur, subsequent encounter for closed fracture with delayed healing    THERAPY DIAG:  Muscle weakness (generalized) - Plan: PT plan  of care cert/re-cert  Other abnormalities of gait and mobility - Plan: PT plan of care cert/re-cert  Pain in right leg - Plan: PT plan of care cert/re-cert  Abnormal posture - Plan: PT plan of care cert/re-cert  Rationale for Evaluation and Treatment: Rehabilitation  ONSET DATE: 12/10/23- fall with femur fracture   SUBJECTIVE:   SUBJECTIVE  STATEMENT: My doctor gave me Gabapentin  and I am sleeping much better.   From MD note 01/25/24:  Patient states that she is doing well at this point. She is ambulating with a walker. She is able to ambulate up and down stairs as well. She is not having any consistent pain in the hip. She is sparingly using hydrocodone  to control her pain when she does have it. She has not noticed any redness or drainage around her incisions.   PERTINENT HISTORY: Malignant neoplasm of upper-outer quadrant of right breast in female, estrogen receptor negative (HCC) 2023, IM rod placement  PAIN: 04/08/24 PAIN:  Are you having pain? 2/10, R hip, ache, medication helps, being still makes it worse  PRECAUTIONS: Other: breast cancer and mastectomy   RED FLAGS: None   WEIGHT BEARING RESTRICTIONS: No WBAT per MD   FALLS:  Has patient fallen in last 6 months? Yes. Number of falls 1 on 12/10/23 with IM rod placement on Rt  LIVING ENVIRONMENT: Lives with: lives with their family Lives in: House/apartment Stairs: Yes: Internal: 4 steps; on right going up Has following equipment at home: [name] - 2 wheeled  PLOF: Independent and Leisure: none  PATIENT GOALS: improve mobility, walk without walker  NEXT MD VISIT: 03/31/24  OBJECTIVE:  Note: Objective measures were completed at Evaluation unless otherwise noted.   COGNITION: Overall cognitive status: Within functional limits for tasks assessed     SENSATION: WFL   POSTURE: rounded shoulders, forward head, and weight shift left  PALPATION: Tender over incisions in the Rt anterior and lateral thigh  LOWER EXTREMITY ROM: WFLs   LOWER EXTREMITY MMT: Rt hip 4-/5, Lt 4+/5, bil knees 4+/5   FUNCTIONAL TESTS:  02/14/24 5 times sit to stand:with UE support 19.30 seconds  6 minute walk test: 552 feet with walker with 4/10 RPE -age related norm is 1250 feet  03/04/24:  7 times sit to stand (30 sec): 2 without UE support/ then remaining with UE  support 6 minute walk test: 808 ft with walker with 2-3/10 RPE   04/08/24:  5x sit to stand: 9.59 seconds  6 min walk test: 1019 feet with cane- RPE 6-7/10 (age related norm is 1277 feet)  PSFS : getting on to floor to use foam roller: 5/10 (04/08/24)  GAIT: Distance walked: 50 Assistive device utilized: [employer]ant - 2 wheeled Level of assistance: Modified independence Comments: slow mobility, reduced step length  TREATMENT DATE:  04/08/24: Therapeutic Exercises Leg press seat 6, 80#, 2 x 20 bil; then Rt 35# and Lt 45# 2 x 20 each leg  Seated hamstring stretch 2x20 seconds bil Neuro Re Ed  Hurdles forward and lateral with min hand hold assistance -3 laps, sidestepping x 3 laps  Alternating step taps: 8 x30 reps  Therapeutic activity:  6 min walk test: with cane 1019 feet with cane   04/03/24: Therapeutic Exercises NuStep level 8 x 10 mins; UE 8/LE 8, 531 steps- PT present to discuss progress  Leg press seat 6, 80#, 2 x 20 bil; then Rt 35# and Lt 45# 2 x 20 each leg Hip Machine for hip 3 way raises into bil flex, abd and then ext 25# x 20 each  Seated hamstring stretch 2x20 seconds bil Neuro Re Ed  Hurdles forward and lateral with min hand hold assistance -3 laps, sidestepping x 3 laps  Alternating step taps: 8 x30 reps  Modified single leg stance with cone tap on mat table 2x10 taps    04/01/24: Therapeutic Exercises NuStep level 8 x 10 mins; UE 8/LE 8, 507 steps Leg press 80#, 2 x 20 bil; then Rt 35# and Lt 45# 2 x 20 each leg; discussed with pt during this about getting a SPC and she is going to look into getting one of these today Hip Machine for hip 3 way raises into bil flex, abd and then ext 15# x 20 each, pt with good technique today and reports less Rt hip discomfort than she's been having with these exercise Seated hamstring stretch 2x20  seconds bil at end of session Neuro Re Ed  Resisted walking: 10# forward (attempted 13# but too difficult) x 8 and reverse with 10# x 2 then 13# x 6 reps with CGA and 1 occasion of min assist with balance  PATIENT EDUCATION:  Education details: Access Code: [email or web address] Person educated: Patient Education method: Explanation, Demonstration, and Handouts Education comprehension: verbalized understanding and returned demonstration  HOME EXERCISE PROGRAM: Access Code: [email or web address] (use new access code), [id number] Access Code: [email or web address] URL: [email or web address] Date: 02/26/2024 Prepared by: Berwyn Knights  Exercises - Standing Hip Flexion with Resistance (Mirrored)  - 1 x daily - 7 x weekly - 1 sets - 10 reps - 3 hold - Standing Hip Extension with Resistance  - 1 x daily - 7 x weekly - 1 sets - 10 reps - 3 hold - Standing Hip Abduction with Theraband Resistance  - 1 x daily - 7 x weekly - 1 sets - 10 reps - 3 hold - Supine Bridge  - 1 x daily - 7 x weekly - 2 sets - 10 reps - 5 hold - Seated Long Arc Quad with Ankle Weight  - 1 x daily - 7 x weekly - 2 sets - 10 reps - 5 hold - Seated March  - 2 x daily - 7 x weekly - 3 sets - 10 reps - Sit to Stand Without Arm Support  - 2 x daily - 7 x weekly - 2 sets - 5-10 reps - Standing Tandem Balance with Counter Support  - 1 x daily - 7 x weekly - 5 reps - 30-60 hold - Standing Single Leg Stance with Counter Support  - 1 x daily - 7 x weekly - 1 sets - 5 reps - 30-60 hold - Standing Heel Raise with Support  - 1 x daily - 7 x weekly - 3 sets - 10 reps - 3  hold - Runner's Climb  - 2 x daily - 7 x weekly - 2-3 sets - 10 reps - 3-5 hold - Seated Hamstring Stretch  - 1 x daily - 7 x weekly - 1 sets - 3 reps - 20 hold - Standing Hip Flexor Stretch  - 2 x daily - 7 x weekly - 1 sets - 3 reps - 20 hold  ASSESSMENT: CLINICAL IMPRESSION: Pt is making steady progress with PT.  6 min walk test is improved to 1019 feet with use of cane. Age related  norma is 1277 feet.   6-7/10 RPE at the end of test.  5x sit to stand is <10 seconds, indicating reduced falls risk.  She is using cane for all distances  and demonstrates mild antalgia with use of cane, significant antalgia without cane.  Pt reports challenge getting on/off the floor to use her foam roll and goal set for that. PT monitored throughout session for safety, cueing and guarding as needed.  Patient will benefit from skilled PT to address the below impairments and improve overall function.   OBJECTIVE IMPAIRMENTS: Abnormal gait, decreased activity tolerance, decreased balance, decreased mobility, difficulty walking, decreased ROM, decreased strength, hypomobility, increased fascial restrictions, increased muscle spasms, impaired flexibility, and pain.   ACTIVITY LIMITATIONS: carrying, lifting, standing, squatting, stairs, transfers, dressing, hygiene/grooming, locomotion level, and caring for others  PARTICIPATION LIMITATIONS: meal prep, cleaning, laundry, driving, shopping, community activity, and church  PERSONAL FACTORS: Age, Time since onset of injury/illness/exacerbation, and 1 comorbidity: breast cancer  are also affecting patient's functional outcome.   REHAB POTENTIAL: Good  CLINICAL DECISION MAKING: Evolving/moderate complexity  EVALUATION COMPLEXITY: Moderate   GOALS: Goals reviewed with patient? Yes  SHORT TERM GOALS: Target date: 03/20/2024   Be independent in initial HEP Baseline: 03/04/24 - Pt is independent with initial HEP Goal status: MET   2.  Perform sit to stand transition without UE support with neutral hips due to improved functional strength  Baseline: 03/04/24 - increased to 7 reps with 2/7 no UE support; 03/13/24 - with controlled motion pt able to perform correctly Goal status: MET  3.  Improve 6 min walk test to > or = to 750 feet to improve community distance  Baseline: 552 feet; 03/04/24 - 808 ft with walker Goal status: MET  4.  Report > or  = to 30% reduction in Rt hip pain with daily tasks  Baseline: 03/04/24 - 30% with pain meds Goal status: MET    LONG TERM GOALS: Target date: 06/03/2024   Be independent in advanced HEP Baseline: independent in current HEP Goal status: ONGOING  2. Improved PSFS (patient specific functional scale) to > or = to 6/10 for getting on/off the floor to use foam roller  Baseline: 5/10 (04/08/24) Goal status: NEW  3.  Improve 6 min walk test to > or = to 1200 feet to improve community ambulation  Baseline: 1019 with cane (04/08/24) Goal status: INITIAL  4.  Wean from cane for all distances due to symmetry with ambulation on level surfaces  Baseline: antalgia without cane, mild antalgia with cane  Goal status: NEW  5.  Report < or = to 3/10 RPE with 6 min walk test due to improved endurance and strength  Baseline: 6-7/10 Goal status: NEW   PLAN:  PT FREQUENCY: 2x/week  PT DURATION: 8 weeks  PLANNED INTERVENTIONS: 97110-Therapeutic exercises, 97530- Therapeutic activity, [id number]- Neuromuscular re-education, 97535- Self Care, [id number]- Manual therapy, [phone]- Canalith repositioning, [id number]- Aquatic Therapy, [id number]- Electrical  stimulation (unattended), 20560 (1-2 muscles), 20561 (3+ muscles)- Dry Needling, Patient/Family education, Balance training, Stair training, Taping, Joint mobilization, Scar mobilization, Vestibular training, Cryotherapy, and Moist heat  PLAN FOR NEXT SESSION: Cont NuStep, cont gait with SPC,  work on functional tasks and mobility, cont high level balance tasks including stepping over obstacles as SLS is challenging and lifting Rt knee;  sit to stand with staggered stance,  floor transfers using floor mat Burnard Joy, PT 04/08/24 1:20 PM     [facility] [address], [address] [place], [place] [address] Phone # [phone] Fax [phone]

## 2024-04-09 ENCOUNTER — Ambulatory Visit: Payer: Self-pay | Admitting: Family Medicine

## 2024-04-09 DIAGNOSIS — E785 Hyperlipidemia, unspecified: Secondary | ICD-10-CM

## 2024-04-09 NOTE — Progress Notes (Signed)
 Cholesterol is elevated.  She would benefit from starting a statin to improve her numbers and lower risk of heart attack and stroke.  Please send in Lipitor 40 mg daily if she is agreeable to start.  Regardless, she should continue to work on diet and exercise and we can recheck again in a year.  Her A1c is a little bit elevated.  Do not need to start meds for this but she should continue to work on diet and exercise and we can recheck this in a year as well.  She is slightly anemic but better than the last several months than we checked.  All of her other labs are at goal.  Do not need to make any other changes to her treatment plan at this time.  She should continue to work on diet and exercise and we can recheck again at next office visit.

## 2024-04-10 ENCOUNTER — Ambulatory Visit

## 2024-04-10 DIAGNOSIS — R2689 Other abnormalities of gait and mobility: Secondary | ICD-10-CM

## 2024-04-10 DIAGNOSIS — R293 Abnormal posture: Secondary | ICD-10-CM

## 2024-04-10 DIAGNOSIS — M6281 Muscle weakness (generalized): Secondary | ICD-10-CM | POA: Diagnosis not present

## 2024-04-10 DIAGNOSIS — M79604 Pain in right leg: Secondary | ICD-10-CM

## 2024-04-10 NOTE — Therapy (Signed)
 OUTPATIENT PHYSICAL THERAPY LOWER EXTREMITY TREATMENT   Patient Name: Marie Day MRN: [id number] DOB:[birth year], 78 y.o., female Today's Date: 04/10/2024     END OF SESSION:  PT End of Session - 04/10/24 1319     Visit Number 17    Date for Recertification  06/03/24    Authorization Type [employer]-no auth required    Progress Note Due on Visit 20    PT Start Time 1232    PT Stop Time 1314    PT Time Calculation (min) 42 min    Activity Tolerance Patient tolerated treatment well    Behavior During Therapy [facility] for tasks assessed/performed               Past Medical History:  Diagnosis Date   Arthritis    Breast cancer (HCC)    Cancer (HCC)    Past Surgical History:  Procedure Laterality Date   BREAST BIOPSY Right 01/30/2023   US  [name] BX W LOC DEV 1ST LESION IMG BX SPEC US  GUIDE 01/30/2023 [facility]   BREAST RECONSTRUCTION WITH PLACEMENT OF TISSUE EXPANDER AND FLEX HD (ACELLULAR HYDRATED DERMIS) Right 06/29/2021   Procedure: RIGHT BREAST RECONSTRUCTION WITH PLACEMENT OF TISSUE EXPANDER AND FLEX HD (ACELLULAR HYDRATED DERMIS);  Surgeon: Elisabeth Craig RAMAN, MD;  Location: [facility];  Service: Plastics;  Laterality: Right;   BREAST REDUCTION WITH MASTOPEXY Left 06/30/2022   Procedure: LEFT BREAST REDUCTION WITH MASTOPEXY;  Surgeon: Lowery Estefana RAMAN, DO;  Location: [facility];  Service: Plastics;  Laterality: Left;   INTRAMEDULLARY (IM) NAIL INTERTROCHANTERIC Right 12/10/2023   Procedure: FIXATION, FRACTURE, INTERTROCHANTERIC, WITH INTRAMEDULLARY ROD;  Surgeon: Georgina Ozell LABOR, MD;  Location: [facility];  Service: Orthopedics;  Laterality: Right;   MASTECTOMY Right 06/29/2021   MASTECTOMY W/ SENTINEL NODE BIOPSY Right 06/29/2021   Procedure: RIGHT MASTECTOMY WITH SENTINEL LYMPH NODE BIOPSY;  Surgeon: Vanderbilt Ned, MD;  Location: [facility];  Service: General;  Laterality: Right;   PORT-A-CATH REMOVAL Left  06/30/2022   Procedure: REMOVAL PORT-A-CATH;  Surgeon: Lowery Estefana RAMAN, DO;  Location: [facility];  Service: Plastics;  Laterality: Left;   REMOVAL OF TISSUE EXPANDER AND PLACEMENT OF IMPLANT Right 06/30/2022   Procedure: REMOVAL OF TISSUE EXPANDER AND PLACEMENT OF IMPLANT;  Surgeon: Lowery Estefana RAMAN, DO;  Location:  [facility];  Service: Plastics;  Laterality: Right;   WISDOM TOOTH EXTRACTION     Patient Active Problem List   Diagnosis Date Noted   Osteoporosis 01/01/2024   Elevated antinuclear antibody (ANA) level 09/27/2023   Insomnia 09/18/2023   Dyslipidemia 03/24/2023   Body mass index (BMI) 24.0-24.9, adult 02/28/2023   Joint swelling 02/28/2023   Other specified abnormal immunological findings in serum 02/28/2023   Pain in limb 02/28/2023   Tinnitus 12/21/2022   Breast cancer (HCC) 12/21/2022   Joint stiffness 09/01/2022   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   Polyarthralgia 02/08/2022   S/P breast reconstruction 06/29/2021   Malignant neoplasm of upper-outer quadrant of right breast in female, estrogen receptor negative (HCC) 12/21/2020   Anemia 10/27/2020   Prediabetes 10/27/2020   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   Unspecified age-related cataract 12/14/2015    PCP: Kennyth Bart, MD  REFERRING PROVIDER: Georgina Ozell, MD  REFERRING DIAG:  Diagnosis  S72.141G (ICD-10-CM) - Displaced intertrochanteric fracture of right femur, subsequent encounter for closed fracture with delayed healing    THERAPY DIAG:  Muscle weakness (generalized)  Other abnormalities  of gait and mobility  Pain in right leg  Abnormal posture  Rationale for Evaluation and Treatment: Rehabilitation  ONSET DATE: 12/10/23- fall with femur fracture   SUBJECTIVE:   SUBJECTIVE STATEMENT: I've been using the cane at home and my leg really feels better when I use it.   From MD note 01/25/24:  Patient states that she  is doing well at this point. She is ambulating with a walker. She is able to ambulate up and down stairs as well. She is not having any consistent pain in the hip. She is sparingly using hydrocodone  to control her pain when she does have it. She has not noticed any redness or drainage around her incisions.   PERTINENT HISTORY: Malignant neoplasm of upper-outer quadrant of right breast in female, estrogen receptor negative (HCC) 2023, IM rod placement  PAIN: 04/08/24 PAIN:  Are you having pain? 2/10, R hip, ache, medication helps, being still makes it worse  PRECAUTIONS: Other: breast cancer and mastectomy   RED FLAGS: None   WEIGHT BEARING RESTRICTIONS: No WBAT per MD   FALLS:  Has patient fallen in last 6 months? Yes. Number of falls 1 on 12/10/23 with IM rod placement on Rt  LIVING ENVIRONMENT: Lives with: lives with their family Lives in: House/apartment Stairs: Yes: Internal: 4 steps; on right going up Has following equipment at home: [name] - 2 wheeled  PLOF: Independent and Leisure: none  PATIENT GOALS: improve mobility, walk without walker  NEXT MD VISIT: 03/31/24  OBJECTIVE:  Note: Objective measures were completed at Evaluation unless otherwise noted.   COGNITION: Overall cognitive status: Within functional limits for tasks assessed     SENSATION: WFL   POSTURE: rounded shoulders, forward head, and weight shift left  PALPATION: Tender over incisions in the Rt anterior and lateral thigh  LOWER EXTREMITY ROM: WFLs   LOWER EXTREMITY MMT: Rt hip 4-/5, Lt 4+/5, bil knees 4+/5   FUNCTIONAL TESTS:  02/14/24 5 times sit to stand:with UE support 19.30 seconds  6 minute walk test: 552 feet with walker with 4/10 RPE -age related norm is 1250 feet  03/04/24:  7 times sit to stand (30 sec): 2 without UE support/ then remaining with UE support 6 minute walk test: 808 ft with walker with 2-3/10 RPE   04/08/24:  5x sit to stand: 9.59 seconds  6 min walk test:  1019 feet with cane- RPE 6-7/10 (age related norm is 1277 feet)  PSFS : getting on to floor to use foam roller: 5/10 (04/08/24)  GAIT: Distance walked: 50 Assistive device utilized: Environmental Consultant - 2 wheeled Level of assistance: Modified independence Comments: slow mobility, reduced step length                                                                                                                             TREATMENT DATE:  04/10/24: Therapeutic Exercises NuStep level 8 x 10 mins; UE 8/LE 8, 595 steps- PT present to  discuss progress  Leg press seat 6, 80# x 20 bil, 85# x20; then Rt 40# 4x10 and Lt 45# 2 x 20 each leg Hip Machine for hip 3 way raises into bil flex, abd and then ext 25# x 20 each  Seated hamstring stretch 2x20 seconds bil Neuro Re Ed  Hurdles forward and lateral with min hand hold assistance -3 laps, sidestepping x 3 laps  Modified single leg stance with cone tap on mat table 2x10 taps  Holding kettlebell with Farmer's carry: 10# with alternating marching x20 each side   04/08/24: Therapeutic Exercises Leg press seat 6, 80#, 2 x 20 bil; then Rt 35# and Lt 45# 2 x 20 each leg  Seated hamstring stretch 2x20 seconds bil Neuro Re Ed  Hurdles forward and lateral with min hand hold assistance -3 laps, sidestepping x 3 laps  Alternating step taps: 8 x30 reps  Therapeutic activity:  6 min walk test: with cane 1019 feet with cane   04/03/24: Therapeutic Exercises NuStep level 8 x 10 mins; UE 8/LE 8, 531 steps- PT present to discuss progress  Leg press seat 6, 80#, 2 x 20 bil; then Rt 35# and Lt 45# 2 x 20 each leg Hip Machine for hip 3 way raises into bil flex, abd and then ext 25# x 20 each  Seated hamstring stretch 2x20 seconds bil Neuro Re Ed  Hurdles forward and lateral with min hand hold assistance -3 laps, sidestepping x 3 laps  Alternating step taps: 8 x30 reps  Modified single leg stance with cone tap on mat table 2x10 taps     PATIENT EDUCATION:   Education details: Access Code: [email or web address] Person educated: Patient Education method: Explanation, Demonstration, and Handouts Education comprehension: verbalized understanding and returned demonstration  HOME EXERCISE PROGRAM: Access Code: [email or web address] (use new access code), [id number] Access Code: [email or web address] URL: [email or web address] Date: 02/26/2024 Prepared by: Berwyn Knights  Exercises - Standing Hip Flexion with Resistance (Mirrored)  - 1 x daily - 7 x weekly - 1 sets - 10 reps - 3 hold - Standing Hip Extension with Resistance  - 1 x daily - 7 x weekly - 1 sets - 10 reps - 3 hold - Standing Hip Abduction with Theraband Resistance  - 1 x daily - 7 x weekly - 1 sets - 10 reps - 3 hold - Supine Bridge  - 1 x daily - 7 x weekly - 2 sets - 10 reps - 5 hold - Seated Long Arc Quad with Ankle Weight  - 1 x daily - 7 x weekly - 2 sets - 10 reps - 5 hold - Seated March  - 2 x daily - 7 x weekly - 3 sets - 10 reps - Sit to Stand Without Arm Support  - 2 x daily - 7 x weekly - 2 sets - 5-10 reps - Standing Tandem Balance with Counter Support  - 1 x daily - 7 x weekly - 5 reps - 30-60 hold - Standing Single Leg Stance with Counter Support  - 1 x daily - 7 x weekly - 1 sets - 5 reps - 30-60 hold - Standing Heel Raise with Support  - 1 x daily - 7 x weekly - 3 sets - 10 reps - 3 hold - Runner's Climb  - 2 x daily - 7 x weekly - 2-3 sets - 10 reps - 3-5 hold - Seated Hamstring Stretch  - 1 x daily - 7 x weekly - 1 sets - 3 reps -  20 hold - Standing Hip Flexor Stretch  - 2 x daily - 7 x weekly - 1 sets - 3 reps - 20 hold  ASSESSMENT: CLINICAL IMPRESSION: Pt is making steady progress with PT.  6 min walk test improved this week to 1019 feet with use of cane.  She is using cane for all distances  including inside the house and demonstrates mild antalgia with use of cane, significant antalgia without cane.  Pt advanced weights on leg press today and demonstrated good form.  PT monitored  throughout session for safety, cueing and guarding as needed.  Patient will benefit from skilled PT to address the below impairments and improve overall function.   OBJECTIVE IMPAIRMENTS: Abnormal gait, decreased activity tolerance, decreased balance, decreased mobility, difficulty walking, decreased ROM, decreased strength, hypomobility, increased fascial restrictions, increased muscle spasms, impaired flexibility, and pain.   ACTIVITY LIMITATIONS: carrying, lifting, standing, squatting, stairs, transfers, dressing, hygiene/grooming, locomotion level, and caring for others  PARTICIPATION LIMITATIONS: meal prep, cleaning, laundry, driving, shopping, community activity, and church  PERSONAL FACTORS: Age, Time since onset of injury/illness/exacerbation, and 1 comorbidity: breast cancer  are also affecting patient's functional outcome.   REHAB POTENTIAL: Good  CLINICAL DECISION MAKING: Evolving/moderate complexity  EVALUATION COMPLEXITY: Moderate   GOALS: Goals reviewed with patient? Yes  SHORT TERM GOALS: Target date: 03/20/2024   Be independent in initial HEP Baseline: 03/04/24 - Pt is independent with initial HEP Goal status: MET   2.  Perform sit to stand transition without UE support with neutral hips due to improved functional strength  Baseline: 03/04/24 - increased to 7 reps with 2/7 no UE support; 03/13/24 - with controlled motion pt able to perform correctly Goal status: MET  3.  Improve 6 min walk test to > or = to 750 feet to improve community distance  Baseline: 552 feet; 03/04/24 - 808 ft with walker Goal status: MET  4.  Report > or = to 30% reduction in Rt hip pain with daily tasks  Baseline: 03/04/24 - 30% with pain meds Goal status: MET    LONG TERM GOALS: Target date: 06/03/2024   Be independent in advanced HEP Baseline: independent in current HEP Goal status: ONGOING  2. Improved PSFS (patient specific functional scale) to > or = to 6/10 for getting  on/off the floor to use foam roller  Baseline: 5/10 (04/08/24) Goal status: NEW  3.  Improve 6 min walk test to > or = to 1200 feet to improve community ambulation  Baseline: 1019 with cane (04/08/24) Goal status: INITIAL  4.  Wean from cane for all distances due to symmetry with ambulation on level surfaces  Baseline: antalgia without cane, mild antalgia with cane  Goal status: NEW  5.  Report < or = to 3/10 RPE with 6 min walk test due to improved endurance and strength  Baseline: 6-7/10 Goal status: NEW   PLAN:  PT FREQUENCY: 2x/week  PT DURATION: 8 weeks  PLANNED INTERVENTIONS: 97110-Therapeutic exercises, 97530- Therapeutic activity, 97112- Neuromuscular re-education, 97535- Self Care, 02859- Manual therapy, [phone]- Canalith repositioning, [id number]- Aquatic Therapy, [id number]- Electrical stimulation (unattended), 20560 (1-2 muscles), 20561 (3+ muscles)- Dry Needling, Patient/Family education, Balance training, Stair training, Taping, Joint mobilization, Scar mobilization, Vestibular training, Cryotherapy, and Moist heat  PLAN FOR NEXT SESSION: Cont NuStep, cont gait with SPC,  work on functional tasks and mobility, cont high level balance tasks including stepping over obstacles as SLS is challenging and lifting Rt knee;  sit to stand with staggered stance,  floor transfers using floor mat Burnard Joy, PT 04/10/24 1:20 PM     [facility] [address] [place], [place] [address] Phone # [phone] Fax [phone]

## 2024-04-15 ENCOUNTER — Ambulatory Visit

## 2024-04-15 DIAGNOSIS — M6281 Muscle weakness (generalized): Secondary | ICD-10-CM | POA: Diagnosis not present

## 2024-04-15 DIAGNOSIS — R2689 Other abnormalities of gait and mobility: Secondary | ICD-10-CM

## 2024-04-15 DIAGNOSIS — M79604 Pain in right leg: Secondary | ICD-10-CM

## 2024-04-15 DIAGNOSIS — R293 Abnormal posture: Secondary | ICD-10-CM

## 2024-04-15 NOTE — Therapy (Signed)
 OUTPATIENT PHYSICAL THERAPY LOWER EXTREMITY TREATMENT   Patient Name: Marie Day MRN: [id number] DOB:Aug 20, 1945, 78 y.o., female Today's Date: 04/15/2024     END OF SESSION:  PT End of Session - 04/15/24 1316     Visit Number 18    Date for Recertification  06/03/24    Authorization Type [employer]-no auth required    Progress Note Due on Visit 20    PT Start Time 1231    PT Stop Time 1315    PT Time Calculation (min) 44 min    Activity Tolerance Patient tolerated treatment well    Behavior During Therapy [facility] for tasks assessed/performed                Past Medical History:  Diagnosis Date   Arthritis    Breast cancer (HCC)    Cancer (HCC)    Past Surgical History:  Procedure Laterality Date   BREAST BIOPSY Right 01/30/2023   US  [name] BX W LOC DEV 1ST LESION IMG BX SPEC US  GUIDE 01/30/2023 [facility]AM[facility]   BREAST RECONSTRUCTION WITH PLACEMENT OF TISSUE EXPANDER AND FLEX HD (ACELLULAR HYDRATED DERMIS) Right 06/29/2021   Procedure: RIGHT BREAST RECONSTRUCTION WITH PLACEMENT OF TISSUE EXPANDER AND FLEX HD (ACELLULAR HYDRATED DERMIS);  Surgeon: Elisabeth Craig RAMAN, MD;  Location: [facility];  Service: Plastics;  Laterality: Right;   BREAST REDUCTION WITH MASTOPEXY Left 06/30/2022   Procedure: LEFT BREAST REDUCTION WITH MASTOPEXY;  Surgeon: Lowery Estefana RAMAN, DO;  Location: [facility];  Service: Plastics;  Laterality: Left;   INTRAMEDULLARY (IM) NAIL INTERTROCHANTERIC Right 12/10/2023   Procedure: FIXATION, FRACTURE, INTERTROCHANTERIC, WITH INTRAMEDULLARY ROD;  Surgeon: Georgina Ozell LABOR, MD;  Location: [facility];  Service: Orthopedics;  Laterality: Right;   MASTECTOMY Right 06/29/2021   MASTECTOMY W/ SENTINEL NODE BIOPSY Right 06/29/2021   Procedure: RIGHT MASTECTOMY WITH SENTINEL LYMPH NODE BIOPSY;  Surgeon: Vanderbilt Ned, MD;  Location: [facility];  Service: General;  Laterality: Right;   PORT-A-CATH REMOVAL Left  06/30/2022   Procedure: REMOVAL PORT-A-CATH;  Surgeon: Lowery Estefana RAMAN, DO;  Location:  SURGERY CENTER;  Service: Plastics;  Laterality: Left;   REMOVAL OF TISSUE EXPANDER AND PLACEMENT OF IMPLANT Right 06/30/2022   Procedure: REMOVAL OF TISSUE EXPANDER AND PLACEMENT OF IMPLANT;  Surgeon: Lowery Estefana RAMAN, DO;  Location:  SURGERY CENTER;  Service: Plastics;  Laterality: Right;   WISDOM TOOTH EXTRACTION     Patient Active Problem List   Diagnosis Date Noted   Osteoporosis 01/01/2024   Elevated antinuclear antibody (ANA) level 09/27/2023   Insomnia 09/18/2023   Dyslipidemia 03/24/2023   Body mass index (BMI) 24.0-24.9, adult 02/28/2023   Joint swelling 02/28/2023   Other specified abnormal immunological findings in serum 02/28/2023   Pain in limb 02/28/2023   Tinnitus 12/21/2022   Breast cancer (HCC) 12/21/2022   Joint stiffness 09/01/2022   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   Polyarthralgia 02/08/2022   S/P breast reconstruction 06/29/2021   Malignant neoplasm of upper-outer quadrant of right breast in female, estrogen receptor negative (HCC) 12/21/2020   Anemia 10/27/2020   Prediabetes 10/27/2020   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   Unspecified age-related cataract 12/14/2015    PCP: Kennyth Bart, MD  REFERRING PROVIDER: Georgina Ozell, MD  REFERRING DIAG:  Diagnosis  S72.141G (ICD-10-CM) - Displaced intertrochanteric fracture of right femur, subsequent encounter for closed fracture with delayed healing    THERAPY DIAG:  Muscle weakness (generalized)  Other  abnormalities of gait and mobility  Pain in right leg  Abnormal posture  Rationale for Evaluation and Treatment: Rehabilitation  ONSET DATE: 12/10/23- fall with femur fracture   SUBJECTIVE:   SUBJECTIVE STATEMENT: I'm feeling more aching in my hip today.  It started this morning when I woke up.    From MD note 01/25/24:  Patient states that  she is doing well at this point. She is ambulating with a walker. She is able to ambulate up and down stairs as well. She is not having any consistent pain in the hip. She is sparingly using hydrocodone  to control her pain when she does have it. She has not noticed any redness or drainage around her incisions.   PERTINENT HISTORY: Malignant neoplasm of upper-outer quadrant of right breast in female, estrogen receptor negative (HCC) 2023, IM rod placement  PAIN: 04/15/24 PAIN:  Are you having pain? 4-5/10, R hip, ache, medication helps, being still makes it worse  PRECAUTIONS: Other: breast cancer and mastectomy   RED FLAGS: None   WEIGHT BEARING RESTRICTIONS: No WBAT per MD   FALLS:  Has patient fallen in last 6 months? Yes. Number of falls 1 on 12/10/23 with IM rod placement on Rt  LIVING ENVIRONMENT: Lives with: lives with their family Lives in: House/apartment Stairs: Yes: Internal: 4 steps; on right going up Has following equipment at home: [name] - 2 wheeled  PLOF: Independent and Leisure: none  PATIENT GOALS: improve mobility, walk without walker  NEXT MD VISIT: 03/31/24  OBJECTIVE:  Note: Objective measures were completed at Evaluation unless otherwise noted.   COGNITION: Overall cognitive status: Within functional limits for tasks assessed     SENSATION: WFL   POSTURE: rounded shoulders, forward head, and weight shift left  PALPATION: Tender over incisions in the Rt anterior and lateral thigh  LOWER EXTREMITY ROM: WFLs   LOWER EXTREMITY MMT: Rt hip 4-/5, Lt 4+/5, bil knees 4+/5   FUNCTIONAL TESTS:  02/14/24 5 times sit to stand:with UE support 19.30 seconds  6 minute walk test: 552 feet with walker with 4/10 RPE -age related norm is 1250 feet  03/04/24:  7 times sit to stand (30 sec): 2 without UE support/ then remaining with UE support 6 minute walk test: 808 ft with walker with 2-3/10 RPE   04/08/24:  5x sit to stand: 9.59 seconds  6 min walk  test: 1019 feet with cane- RPE 6-7/10 (age related norm is 1277 feet)  PSFS : getting on to floor to use foam roller: 5/10 (04/08/24)  GAIT: Distance walked: 50 Assistive device utilized: Environmental Consultant - 2 wheeled Level of assistance: Modified independence Comments: slow mobility, reduced step length                                                                                                                             TREATMENT DATE:   04/15/24: Therapeutic Exercises NuStep level 8 x 10 mins; UE 8/LE 8, 595 steps-  PT present to discuss progress  Leg press seat 6, 80# 2x20 then Rt 35# 2x20 and Lt 45# 2 x 20 each leg Seated hamstring stretch 2x20 seconds bil Neuro Re Ed  Hurdles forward and lateral with min hand hold assistance -3 laps, sidestepping x 3 laps  Holding kettlebell with Farmer's carry: 10# with alternating step taps on 6 step x10 bil each   04/10/24: Therapeutic Exercises NuStep level 8 x 10 mins; UE 8/LE 8, 595 steps- PT present to discuss progress  Leg press seat 6, 80# x 20 bil, 85# x20; then Rt 40# 4x10 and Lt 45# 2 x 20 each leg Hip Machine for hip 3 way raises into bil flex, abd and then ext 25# x 20 each  Seated hamstring stretch 2x20 seconds bil Neuro Re Ed  Hurdles forward and lateral with min hand hold assistance -3 laps, sidestepping x 3 laps  Modified single leg stance with cone tap on mat table 2x10 taps  Holding kettlebell with Farmer's carry: 10# with alternating marching x20 each side   04/08/24: Therapeutic Exercises Leg press seat 6, 80#, 2 x 20 bil; then Rt 35# and Lt 45# 2 x 20 each leg  Seated hamstring stretch 2x20 seconds bil Neuro Re Ed  Hurdles forward and lateral with min hand hold assistance -3 laps, sidestepping x 3 laps  Alternating step taps: 8 x30 reps  Therapeutic activity:  6 min walk test: with cane 1019 feet with cane     PATIENT EDUCATION:  Education details: Access Code: [email or web address] Person educated: Patient Education  method: Explanation, Demonstration, and Handouts Education comprehension: verbalized understanding and returned demonstration  HOME EXERCISE PROGRAM: Access Code: [email or web address] (use new access code), [id number] Access Code: [email or web address] URL: [email or web address] Date: 02/26/2024 Prepared by: Berwyn Knights  Exercises - Standing Hip Flexion with Resistance (Mirrored)  - 1 x daily - 7 x weekly - 1 sets - 10 reps - 3 hold - Standing Hip Extension with Resistance  - 1 x daily - 7 x weekly - 1 sets - 10 reps - 3 hold - Standing Hip Abduction with Theraband Resistance  - 1 x daily - 7 x weekly - 1 sets - 10 reps - 3 hold - Supine Bridge  - 1 x daily - 7 x weekly - 2 sets - 10 reps - 5 hold - Seated Long Arc Quad with Ankle Weight  - 1 x daily - 7 x weekly - 2 sets - 10 reps - 5 hold - Seated March  - 2 x daily - 7 x weekly - 3 sets - 10 reps - Sit to Stand Without Arm Support  - 2 x daily - 7 x weekly - 2 sets - 5-10 reps - Standing Tandem Balance with Counter Support  - 1 x daily - 7 x weekly - 5 reps - 30-60 hold - Standing Single Leg Stance with Counter Support  - 1 x daily - 7 x weekly - 1 sets - 5 reps - 30-60 hold - Standing Heel Raise with Support  - 1 x daily - 7 x weekly - 3 sets - 10 reps - 3 hold - Runner's Climb  - 2 x daily - 7 x weekly - 2-3 sets - 10 reps - 3-5 hold - Seated Hamstring Stretch  - 1 x daily - 7 x weekly - 1 sets - 3 reps - 20 hold - Standing Hip Flexor Stretch  - 2 x daily - 7 x weekly - 1 sets -  3 reps - 20 hold  ASSESSMENT: CLINICAL IMPRESSION: Pt with some increased Rt hip pain today so weights were reduced today due to this.  She is using cane for all distances  including inside the house and demonstrates mild antalgia with use of cane, significant antalgia without cane. Improved stability on the Rt with hurdles and able to stabilize without UE support with symmetry with forward and lateral motions.   PT monitored throughout session for safety, cueing  and guarding as needed.  Patient will benefit from skilled PT to address the below impairments and improve overall function.   OBJECTIVE IMPAIRMENTS: Abnormal gait, decreased activity tolerance, decreased balance, decreased mobility, difficulty walking, decreased ROM, decreased strength, hypomobility, increased fascial restrictions, increased muscle spasms, impaired flexibility, and pain.   ACTIVITY LIMITATIONS: carrying, lifting, standing, squatting, stairs, transfers, dressing, hygiene/grooming, locomotion level, and caring for others  PARTICIPATION LIMITATIONS: meal prep, cleaning, laundry, driving, shopping, community activity, and church  PERSONAL FACTORS: Age, Time since onset of injury/illness/exacerbation, and 1 comorbidity: breast cancer  are also affecting patient's functional outcome.   REHAB POTENTIAL: Good  CLINICAL DECISION MAKING: Evolving/moderate complexity  EVALUATION COMPLEXITY: Moderate   GOALS: Goals reviewed with patient? Yes  SHORT TERM GOALS: Target date: 03/20/2024   Be independent in initial HEP Baseline: 03/04/24 - Pt is independent with initial HEP Goal status: MET   2.  Perform sit to stand transition without UE support with neutral hips due to improved functional strength  Baseline: 03/04/24 - increased to 7 reps with 2/7 no UE support; 03/13/24 - with controlled motion pt able to perform correctly Goal status: MET  3.  Improve 6 min walk test to > or = to 750 feet to improve community distance  Baseline: 552 feet; 03/04/24 - 808 ft with walker Goal status: MET  4.  Report > or = to 30% reduction in Rt hip pain with daily tasks  Baseline: 03/04/24 - 30% with pain meds Goal status: MET    LONG TERM GOALS: Target date: 06/03/2024   Be independent in advanced HEP Baseline: independent in current HEP Goal status: ONGOING  2. Improved PSFS (patient specific functional scale) to > or = to 6/10 for getting on/off the floor to use foam roller   Baseline: 5/10 (04/08/24) Goal status: NEW  3.  Improve 6 min walk test to > or = to 1200 feet to improve community ambulation  Baseline: 1019 with cane (04/08/24) Goal status: INITIAL  4.  Wean from cane for all distances due to symmetry with ambulation on level surfaces  Baseline: antalgia without cane, mild antalgia with cane  Goal status: NEW  5.  Report < or = to 3/10 RPE with 6 min walk test due to improved endurance and strength  Baseline: 6-7/10 Goal status: NEW   PLAN:  PT FREQUENCY: 2x/week  PT DURATION: 8 weeks  PLANNED INTERVENTIONS: 97110-Therapeutic exercises, 97530- Therapeutic activity, 97112- Neuromuscular re-education, 97535- Self Care, 02859- Manual therapy, [id number]- Canalith repositioning, [id number]- Aquatic Therapy, [id number]- Electrical stimulation (unattended), 20560 (1-2 muscles), 20561 (3+ muscles)- Dry Needling, Patient/Family education, Balance training, Stair training, Taping, Joint mobilization, Scar mobilization, Vestibular training, Cryotherapy, and Moist heat  PLAN FOR NEXT SESSION: Cont NuStep, cont gait with SPC,  work on functional tasks and mobility, cont high level balance tasks including stepping over obstacles as SLS is challenging and lifting Rt knee;  sit to stand with staggered stance,  floor transfers using floor mat Burnard Joy, PT 04/15/24 1:17 PM  [facility] [address] [place], [place] [address] Phone # [phone] Fax [phone]

## 2024-04-17 ENCOUNTER — Ambulatory Visit

## 2024-04-17 DIAGNOSIS — R2689 Other abnormalities of gait and mobility: Secondary | ICD-10-CM

## 2024-04-17 DIAGNOSIS — R293 Abnormal posture: Secondary | ICD-10-CM

## 2024-04-17 DIAGNOSIS — M6281 Muscle weakness (generalized): Secondary | ICD-10-CM

## 2024-04-17 DIAGNOSIS — M79604 Pain in right leg: Secondary | ICD-10-CM

## 2024-04-17 NOTE — Therapy (Signed)
 OUTPATIENT PHYSICAL THERAPY LOWER EXTREMITY TREATMENT   Patient Name: Marie Day MRN: [id number] DOB:12/27/1945, 78 y.o., female Today's Date: 04/17/2024     END OF SESSION:  PT End of Session - 04/17/24 1318     Visit Number 19    Date for Recertification  06/03/24    Authorization Type [employer]-no auth required    Progress Note Due on Visit 20    PT Start Time 1234    PT Stop Time 1320    PT Time Calculation (min) 46 min    Activity Tolerance Patient tolerated treatment well    Behavior During Therapy [facility] for tasks assessed/performed                 Past Medical History:  Diagnosis Date   Arthritis    Breast cancer (HCC)    Cancer (HCC)    Past Surgical History:  Procedure Laterality Date   BREAST BIOPSY Right 01/30/2023   US  [name] BX W LOC DEV 1ST LESION IMG BX SPEC US  GUIDE 01/30/2023 [facility]   BREAST RECONSTRUCTION WITH PLACEMENT OF TISSUE EXPANDER AND FLEX HD (ACELLULAR HYDRATED DERMIS) Right 06/29/2021   Procedure: RIGHT BREAST RECONSTRUCTION WITH PLACEMENT OF TISSUE EXPANDER AND FLEX HD (ACELLULAR HYDRATED DERMIS);  Surgeon: Elisabeth Craig RAMAN, MD;  Location: [facility];  Service: Plastics;  Laterality: Right;   BREAST REDUCTION WITH MASTOPEXY Left 06/30/2022   Procedure: LEFT BREAST REDUCTION WITH MASTOPEXY;  Surgeon: Lowery Estefana RAMAN, DO;  Location: [facility];  Service: Plastics;  Laterality: Left;   INTRAMEDULLARY (IM) NAIL INTERTROCHANTERIC Right 12/10/2023   Procedure: FIXATION, FRACTURE, INTERTROCHANTERIC, WITH INTRAMEDULLARY ROD;  Surgeon: Georgina Ozell LABOR, MD;  Location: [facility];  Service: Orthopedics;  Laterality: Right;   MASTECTOMY Right 06/29/2021   MASTECTOMY W/ SENTINEL NODE BIOPSY Right 06/29/2021   Procedure: RIGHT MASTECTOMY WITH SENTINEL LYMPH NODE BIOPSY;  Surgeon: Vanderbilt Ned, MD;  Location: [facility];  Service: General;  Laterality: Right;   PORT-A-CATH REMOVAL Left  06/30/2022   Procedure: REMOVAL PORT-A-CATH;  Surgeon: Lowery Estefana RAMAN, DO;  Location: [facility];  Service: Plastics;  Laterality: Left;   REMOVAL OF TISSUE EXPANDER AND PLACEMENT OF IMPLANT Right 06/30/2022   Procedure: REMOVAL OF TISSUE EXPANDER AND PLACEMENT OF IMPLANT;  Surgeon: Lowery Estefana RAMAN, DO;  Location: [facility];  Service: Plastics;  Laterality: Right;   WISDOM TOOTH EXTRACTION     Patient Active Problem List   Diagnosis Date Noted   Osteoporosis 01/01/2024   Elevated antinuclear antibody (ANA) level 09/27/2023   Insomnia 09/18/2023   Dyslipidemia 03/24/2023   Body mass index (BMI) 24.0-24.9, adult 02/28/2023   Joint swelling 02/28/2023   Other specified abnormal immunological findings in serum 02/28/2023   Pain in limb 02/28/2023   Tinnitus 12/21/2022   Breast cancer (HCC) 12/21/2022   Joint stiffness 09/01/2022   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   Polyarthralgia 02/08/2022   S/P breast reconstruction 06/29/2021   Malignant neoplasm of upper-outer quadrant of right breast in female, estrogen receptor negative (HCC) 12/21/2020   Anemia 10/27/2020   Prediabetes 10/27/2020   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   Unspecified age-related cataract 12/14/2015    PCP: Kennyth Bart, MD  REFERRING PROVIDER: Georgina Ozell, MD  REFERRING DIAG:  Diagnosis  S72.141G (ICD-10-CM) - Displaced intertrochanteric fracture of right femur, subsequent encounter for closed fracture with delayed healing    THERAPY DIAG:  Muscle weakness (generalized)  Other abnormalities of gait and mobility  Pain in right leg  Abnormal posture  Rationale for Evaluation and Treatment: Rehabilitation  ONSET DATE: 12/10/23- fall with femur fracture   SUBJECTIVE:   SUBJECTIVE STATEMENT: My leg felt better after I was here last session and it felt good yesterday.   From MD note 01/25/24:  Patient states that she is  doing well at this point. She is ambulating with a walker. She is able to ambulate up and down stairs as well. She is not having any consistent pain in the hip. She is sparingly using hydrocodone  to control her pain when she does have it. She has not noticed any redness or drainage around her incisions.   PERTINENT HISTORY: Malignant neoplasm of upper-outer quadrant of right breast in female, estrogen receptor negative (HCC) 2023, IM rod placement  PAIN: 04/15/24 PAIN:  Are you having pain? 4-5/10, R hip, ache, medication helps, being still makes it worse  PRECAUTIONS: Other: breast cancer and mastectomy   RED FLAGS: None   WEIGHT BEARING RESTRICTIONS: No WBAT per MD   FALLS:  Has patient fallen in last 6 months? Yes. Number of falls 1 on 12/10/23 with IM rod placement on Rt  LIVING ENVIRONMENT: Lives with: lives with their family Lives in: House/apartment Stairs: Yes: Internal: 4 steps; on right going up Has following equipment at home: [name] - 2 wheeled  PLOF: Independent and Leisure: none  PATIENT GOALS: improve mobility, walk without walker  NEXT MD VISIT: 03/31/24  OBJECTIVE:  Note: Objective measures were completed at Evaluation unless otherwise noted.   COGNITION: Overall cognitive status: Within functional limits for tasks assessed     SENSATION: WFL   POSTURE: rounded shoulders, forward head, and weight shift left  PALPATION: Tender over incisions in the Rt anterior and lateral thigh  LOWER EXTREMITY ROM: WFLs   LOWER EXTREMITY MMT: Rt hip 4-/5, Lt 4+/5, bil knees 4+/5   FUNCTIONAL TESTS:  02/14/24 5 times sit to stand:with UE support 19.30 seconds  6 minute walk test: 552 feet with walker with 4/10 RPE -age related norm is 1250 feet  03/04/24:  7 times sit to stand (30 sec): 2 without UE support/ then remaining with UE support 6 minute walk test: 808 ft with walker with 2-3/10 RPE   04/08/24:  5x sit to stand: 9.59 seconds  6 min walk test: 1019  feet with cane- RPE 6-7/10 (age related norm is 1277 feet)  PSFS : getting on to floor to use foam roller: 5/10 (04/08/24)  GAIT: Distance walked: 50 Assistive device utilized: Environmental Consultant - 2 wheeled Level of assistance: Modified independence Comments: slow mobility, reduced step length                                                                                                                             TREATMENT DATE:   04/17/24: Therapeutic Exercises NuStep level 8 x 10 mins; UE 8/LE 8, 500 steps- PT present  to discuss progress  Leg press seat 6, 80# x 20 bil, 85# x20; then Rt 40# 4x10 and Lt 45# 2 x 20 each leg Standing with green theraband anchored:  hip into bil abd and then ext 2x10 each Seated hamstring stretch 2x20 seconds bil Neuro Re Ed  Hurdles forward and lateral with min hand hold assistance -3 laps, sidestepping x 3 laps  Modified single leg stance with cone tap on mat table 2x10 taps  Sit to stand with 10# kettlebell: parallel stance and staggered stance x10 each    04/15/24: Therapeutic Exercises NuStep level 8 x 10 mins; UE 8/LE 8, 595 steps- PT present to discuss progress  Leg press seat 6, 80# 2x20 then Rt 35# 2x20 and Lt 45# 2 x 20 each leg Seated hamstring stretch 2x20 seconds bil Neuro Re Ed  Hurdles forward and lateral with min hand hold assistance -3 laps, sidestepping x 3 laps  Holding kettlebell with Farmer's carry: 10# with alternating step taps on 6 step x10 bil each   04/10/24: Therapeutic Exercises NuStep level 8 x 10 mins; UE 8/LE 8, 595 steps- PT present to discuss progress  Leg press seat 6, 80# x 20 bil, 85# x20; then Rt 40# 4x10 and Lt 45# 2 x 20 each leg Hip Machine for hip 3 way raises into bil flex, abd and then ext 25# x 20 each  Seated hamstring stretch 2x20 seconds bil Neuro Re Ed  Hurdles forward and lateral with min hand hold assistance -3 laps, sidestepping x 3 laps  Modified single leg stance with cone tap on mat table 2x10 taps   Holding kettlebell with Farmer's carry: 10# with alternating marching x20 each side   PATIENT EDUCATION:  Education details: Access Code: [email or web address] Person educated: Patient Education method: Explanation, Demonstration, and Handouts Education comprehension: verbalized understanding and returned demonstration  HOME EXERCISE PROGRAM: Access Code: [email or web address] (use new access code), [id number] Access Code: [email or web address] URL: [email or web address] Date: 02/26/2024 Prepared by: Berwyn Knights  Exercises - Standing Hip Flexion with Resistance (Mirrored)  - 1 x daily - 7 x weekly - 1 sets - 10 reps - 3 hold - Standing Hip Extension with Resistance  - 1 x daily - 7 x weekly - 1 sets - 10 reps - 3 hold - Standing Hip Abduction with Theraband Resistance  - 1 x daily - 7 x weekly - 1 sets - 10 reps - 3 hold - Supine Bridge  - 1 x daily - 7 x weekly - 2 sets - 10 reps - 5 hold - Seated Long Arc Quad with Ankle Weight  - 1 x daily - 7 x weekly - 2 sets - 10 reps - 5 hold - Seated March  - 2 x daily - 7 x weekly - 3 sets - 10 reps - Sit to Stand Without Arm Support  - 2 x daily - 7 x weekly - 2 sets - 5-10 reps - Standing Tandem Balance with Counter Support  - 1 x daily - 7 x weekly - 5 reps - 30-60 hold - Standing Single Leg Stance with Counter Support  - 1 x daily - 7 x weekly - 1 sets - 5 reps - 30-60 hold - Standing Heel Raise with Support  - 1 x daily - 7 x weekly - 3 sets - 10 reps - 3 hold - Runner's Climb  - 2 x daily - 7 x weekly - 2-3 sets - 10 reps - 3-5 hold - Seated Hamstring Stretch  -  1 x daily - 7 x weekly - 1 sets - 3 reps - 20 hold - Standing Hip Flexor Stretch  - 2 x daily - 7 x weekly - 1 sets - 3 reps - 20 hold  ASSESSMENT: CLINICAL IMPRESSION: Pt with improved pain today and tolerated all exercises well today. She was able to return to prior weights on leg press. Stability on Rt LE is improved and she is able to perform more challenging task and maintain balance with  less guarding.  PT monitored throughout session for safety, cueing and guarding as needed.  Patient will benefit from skilled PT to address the below impairments and improve overall function.   OBJECTIVE IMPAIRMENTS: Abnormal gait, decreased activity tolerance, decreased balance, decreased mobility, difficulty walking, decreased ROM, decreased strength, hypomobility, increased fascial restrictions, increased muscle spasms, impaired flexibility, and pain.   ACTIVITY LIMITATIONS: carrying, lifting, standing, squatting, stairs, transfers, dressing, hygiene/grooming, locomotion level, and caring for others  PARTICIPATION LIMITATIONS: meal prep, cleaning, laundry, driving, shopping, community activity, and church  PERSONAL FACTORS: Age, Time since onset of injury/illness/exacerbation, and 1 comorbidity: breast cancer  are also affecting patient's functional outcome.   REHAB POTENTIAL: Good  CLINICAL DECISION MAKING: Evolving/moderate complexity  EVALUATION COMPLEXITY: Moderate   GOALS: Goals reviewed with patient? Yes  SHORT TERM GOALS: Target date: 03/20/2024   Be independent in initial HEP Baseline: 03/04/24 - Pt is independent with initial HEP Goal status: MET   2.  Perform sit to stand transition without UE support with neutral hips due to improved functional strength  Baseline: 03/04/24 - increased to 7 reps with 2/7 no UE support; 03/13/24 - with controlled motion pt able to perform correctly Goal status: MET  3.  Improve 6 min walk test to > or = to 750 feet to improve community distance  Baseline: 552 feet; 03/04/24 - 808 ft with walker Goal status: MET  4.  Report > or = to 30% reduction in Rt hip pain with daily tasks  Baseline: 03/04/24 - 30% with pain meds Goal status: MET    LONG TERM GOALS: Target date: 06/03/2024   Be independent in advanced HEP Baseline: independent in current HEP Goal status: ONGOING  2. Improved PSFS (patient specific functional scale) to  > or = to 6/10 for getting on/off the floor to use foam roller  Baseline: 5/10 (04/08/24) Goal status: NEW  3.  Improve 6 min walk test to > or = to 1200 feet to improve community ambulation  Baseline: 1019 with cane (04/08/24) Goal status: INITIAL  4.  Wean from cane for all distances due to symmetry with ambulation on level surfaces  Baseline: antalgia without cane, mild antalgia with cane  Goal status: NEW  5.  Report < or = to 3/10 RPE with 6 min walk test due to improved endurance and strength  Baseline: 6-7/10 Goal status: NEW   PLAN:  PT FREQUENCY: 2x/week  PT DURATION: 8 weeks  PLANNED INTERVENTIONS: 97110-Therapeutic exercises, 97530- Therapeutic activity, 97112- Neuromuscular re-education, 97535- Self Care, 02859- Manual therapy, [phone]- Canalith repositioning, [id number]- Aquatic Therapy, G0283- Electrical stimulation (unattended), 20560 (1-2 muscles), 20561 (3+ muscles)- Dry Needling, Patient/Family education, Balance training, Stair training, Taping, Joint mobilization, Scar mobilization, Vestibular training, Cryotherapy, and Moist heat  PLAN FOR NEXT SESSION: Cont NuStep, cont gait with SPC,  work on functional tasks and mobility, cont high level balance tasks including stepping over obstacles as SLS is challenging and lifting Rt knee;  sit to stand with staggered stance,  floor  transfers using floor mat  Burnard Joy, PT 04/17/24 1:22 PM     [facility] [address], [address] [place], [place] [address] Phone # [phone] Fax [phone]

## 2024-04-22 NOTE — Telephone Encounter (Signed)
 See pt response as Marie Day

## 2024-04-22 NOTE — Telephone Encounter (Signed)
 I appreciate the update. I am glad it is working well. She should let us  know if she needs any further assistance.   [employer] [name]. Kennyth, MD 04/22/2024 3:01 PM

## 2024-04-23 ENCOUNTER — Ambulatory Visit: Attending: Orthopedic Surgery

## 2024-04-23 DIAGNOSIS — R293 Abnormal posture: Secondary | ICD-10-CM | POA: Diagnosis present

## 2024-04-23 DIAGNOSIS — R2689 Other abnormalities of gait and mobility: Secondary | ICD-10-CM | POA: Diagnosis present

## 2024-04-23 DIAGNOSIS — M6281 Muscle weakness (generalized): Secondary | ICD-10-CM | POA: Diagnosis present

## 2024-04-23 DIAGNOSIS — M79604 Pain in right leg: Secondary | ICD-10-CM | POA: Insufficient documentation

## 2024-04-23 NOTE — Therapy (Addendum)
 OUTPATIENT PHYSICAL THERAPY LOWER EXTREMITY TREATMENT   Patient Name: Marie Day MRN: [id number] DOB:[birth year], 78 y.o., female Today's Date: 04/23/2024  Progress Note Reporting Period 03/20/24 to 04/23/24  See note below for Objective Data and Assessment of Progress/Goals.    Marie Day, PT 04/23/24 1:04 PM    END OF SESSION:  PT End of Session - 04/23/24 1108     Visit Number 20    Date for Recertification  06/03/24    Authorization Type [employer]-no auth required    Progress Note Due on Visit 20    PT Start Time 1102    PT Stop Time 1158    PT Time Calculation (min) 56 min    Activity Tolerance Patient tolerated treatment well    Behavior During Therapy WFL for tasks assessed/performed                 Past Medical History:  Diagnosis Date   Arthritis    Breast cancer (HCC)    Cancer (HCC)    Past Surgical History:  Procedure Laterality Date   BREAST BIOPSY Right 01/30/2023   US  [name] BX W LOC DEV 1ST LESION IMG BX SPEC US  GUIDE 01/30/2023 [facility]   BREAST RECONSTRUCTION WITH PLACEMENT OF TISSUE EXPANDER AND FLEX HD (ACELLULAR HYDRATED DERMIS) Right 06/29/2021   Procedure: RIGHT BREAST RECONSTRUCTION WITH PLACEMENT OF TISSUE EXPANDER AND FLEX HD (ACELLULAR HYDRATED DERMIS);  Surgeon: Marie Craig RAMAN, MD;  Location: [facility];  Service: Plastics;  Laterality: Right;   BREAST REDUCTION WITH MASTOPEXY Left 06/30/2022   Procedure: LEFT BREAST REDUCTION WITH MASTOPEXY;  Surgeon: Marie Estefana RAMAN, DO;  Location: [facility];  Service: Plastics;  Laterality: Left;   INTRAMEDULLARY (IM) NAIL INTERTROCHANTERIC Right 12/10/2023   Procedure: FIXATION, FRACTURE, INTERTROCHANTERIC, WITH INTRAMEDULLARY ROD;  Surgeon: Marie Day LABOR, MD;  Location: [facility];  Service: Orthopedics;  Laterality: Right;   MASTECTOMY Right 06/29/2021   MASTECTOMY W/ SENTINEL NODE BIOPSY Right 06/29/2021   Procedure: RIGHT MASTECTOMY WITH  SENTINEL LYMPH NODE BIOPSY;  Surgeon: Marie Ned, MD;  Location: [facility];  Service: General;  Laterality: Right;   PORT-A-CATH REMOVAL Left 06/30/2022   Procedure: REMOVAL PORT-A-CATH;  Surgeon: Marie Estefana RAMAN, DO;  Location: [facility];  Service: Plastics;  Laterality: Left;   REMOVAL OF TISSUE EXPANDER AND PLACEMENT OF IMPLANT Right 06/30/2022   Procedure: REMOVAL OF TISSUE EXPANDER AND PLACEMENT OF IMPLANT;  Surgeon: Marie Estefana RAMAN, DO;  Location: [facility];  Service: Plastics;  Laterality: Right;   WISDOM TOOTH EXTRACTION     Patient Active Problem List   Diagnosis Date Noted   Osteoporosis 01/01/2024   Elevated antinuclear antibody (ANA) level 09/27/2023   Insomnia 09/18/2023   Dyslipidemia 03/24/2023   Body mass index (BMI) 24.0-24.9, adult 02/28/2023   Joint swelling 02/28/2023   Other specified abnormal immunological findings in serum 02/28/2023   Pain in limb 02/28/2023   Tinnitus 12/21/2022   Breast cancer (HCC) 12/21/2022   Joint stiffness 09/01/2022   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   Polyarthralgia 02/08/2022   S/P breast reconstruction 06/29/2021   Malignant neoplasm of upper-outer quadrant of right breast in female, estrogen receptor negative (HCC) 12/21/2020   Anemia 10/27/2020   Prediabetes 10/27/2020   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   Unspecified age-related cataract 12/14/2015    PCP: Marie Bart, MD  REFERRING PROVIDER: Georgina Ozell, MD  REFERRING DIAG:  Diagnosis  S72.141G (ICD-10-CM) - Displaced intertrochanteric fracture of right femur, subsequent encounter for closed fracture with delayed healing    THERAPY DIAG:  Muscle weakness (generalized)  Other abnormalities of gait and mobility  Pain in right leg  Abnormal posture  Rationale for Evaluation and Treatment: Rehabilitation  ONSET DATE: 12/10/23- fall with femur fracture    SUBJECTIVE:   SUBJECTIVE STATEMENT: I forgot my SPC when I walked in here I was doing so well! The gabapentin  is working really well to help me sleep at night.   From MD note 01/25/24:  Patient states that she is doing well at this point. She is ambulating with a walker. She is able to ambulate up and down stairs as well. She is not having any consistent pain in the hip. She is sparingly using hydrocodone  to control her pain when she does have it. She has not noticed any redness or drainage around her incisions.   PERTINENT HISTORY: Malignant neoplasm of upper-outer quadrant of right breast in female, estrogen receptor negative (HCC) 2023, IM rod placement  PAIN: 04/15/24 PAIN:  Are you having pain? 4-5/10, R hip, ache, medication helps, being still makes it worse  PRECAUTIONS: Other: breast cancer and mastectomy   RED FLAGS: None   WEIGHT BEARING RESTRICTIONS: No WBAT per MD   FALLS:  Has patient fallen in last 6 months? Yes. Number of falls 1 on 12/10/23 with IM rod placement on Rt  LIVING ENVIRONMENT: Lives with: lives with their family Lives in: House/apartment Stairs: Yes: Internal: 4 steps; on right going up Has following equipment at home: Marie Day - 2 wheeled  PLOF: Independent and Leisure: none  PATIENT GOALS: improve mobility, walk without walker  NEXT MD VISIT: 03/31/24  OBJECTIVE:  Note: Objective measures were completed at Evaluation unless otherwise noted.   COGNITION: Overall cognitive status: Within functional limits for tasks assessed     SENSATION: WFL   POSTURE: rounded shoulders, forward head, and weight shift left  PALPATION: Tender over incisions in the Rt anterior and lateral thigh  LOWER EXTREMITY ROM: WFLs   LOWER EXTREMITY MMT: Rt hip 4-/5, Lt 4+/5, bil knees 4+/5   FUNCTIONAL TESTS:  02/14/24 5 times sit to stand:with UE support 19.30 seconds  6 minute walk test: 552 feet with walker with 4/10 RPE -age related norm is 1250 feet   03/04/24:  7 times sit to stand (30 sec): 2 without UE support/ then remaining with UE support 6 minute walk test: 808 ft with walker with 2-3/10 RPE   04/08/24:  5x sit to stand: 9.59 seconds  6 min walk test: 1019 feet with cane- RPE 6-7/10 (age related norm is 1277 feet)  PSFS : getting on to floor to use foam roller: 5/10 (04/08/24)  GAIT: Distance walked: 50 Assistive device utilized: Environmental Consultant - 2 wheeled Level of assistance: Modified independence Comments: slow mobility, reduced step length  TREATMENT DATE:   04/23/24: Therapeutic Exercises NuStep level 8 x 10 mins; UE 8/LE 8, 527 steps and PTA present to monitor pt Leg Press 85 # 2 x 20; then Rt 40# 4x10 and Lt 50# 2 x 20 each leg Self Care Discussed pts current functional status for progress note and goal assess. Also discussed how she is doing with ambulating with SPC at home and in community.  Therapeutic Activities 6 min walk test with SPC x 979 ft (multiple obstacles in clinic today with many people that slowed pt at times), seated rest break after Standing with green theraband anchored:  hip into bil abd and then ext 2 x 12 each for stability   04/17/24: Therapeutic Exercises NuStep level 8 x 10 mins; UE 8/LE 8, 500 steps- PT present to discuss progress  Leg press seat 6, 80# x 20 bil, 85# x20; then Rt 40# 4x10 and Lt 45# 2 x 20 each leg Standing with green theraband anchored:  hip into bil abd and then ext 2x10 each Seated hamstring stretch 2x20 seconds bil Neuro Re Ed  Hurdles forward and lateral with min hand hold assistance -3 laps, sidestepping x 3 laps  Modified single leg stance with cone tap on mat table 2x10 taps  Sit to stand with 10# kettlebell: parallel stance and staggered stance x10 each    04/15/24: Therapeutic Exercises NuStep level 8 x 10 mins; UE 8/LE 8, 595 steps- PT  present to discuss progress  Leg press seat 6, 80# 2x20 then Rt 35# 2x20 and Lt 45# 2 x 20 each leg Seated hamstring stretch 2x20 seconds bil Neuro Re Ed  Hurdles forward and lateral with min hand hold assistance -3 laps, sidestepping x 3 laps  Holding kettlebell with Farmer's carry: 10# with alternating step taps on 6 step x10 bil each   04/10/24: Therapeutic Exercises NuStep level 8 x 10 mins; UE 8/LE 8, 595 steps- PT present to discuss progress  Leg press seat 6, 80# x 20 bil, 85# x20; then Rt 40# 4x10 and Lt 45# 2 x 20 each leg Hip Machine for hip 3 way raises into bil flex, abd and then ext 25# x 20 each  Seated hamstring stretch 2x20 seconds bil Neuro Re Ed  Hurdles forward and lateral with min hand hold assistance -3 laps, sidestepping x 3 laps  Modified single leg stance with cone tap on mat table 2x10 taps  Holding kettlebell with Farmer's carry: 10# with alternating marching x20 each side   PATIENT EDUCATION:  Education details: Access Code: [email or web address] Person educated: Patient Education method: Explanation, Demonstration, and Handouts Education comprehension: verbalized understanding and returned demonstration  HOME EXERCISE PROGRAM: Access Code: [email or web address] (use new access code), [id number] Access Code: [email or web address] URL: [email or web address] Date: 02/26/2024 Prepared by: Berwyn Knights  Exercises - Standing Hip Flexion with Resistance (Mirrored)  - 1 x daily - 7 x weekly - 1 sets - 10 reps - 3 hold - Standing Hip Extension with Resistance  - 1 x daily - 7 x weekly - 1 sets - 10 reps - 3 hold - Standing Hip Abduction with Theraband Resistance  - 1 x daily - 7 x weekly - 1 sets - 10 reps - 3 hold - Supine Bridge  - 1 x daily - 7 x weekly - 2 sets - 10 reps - 5 hold - Seated Long Arc Quad with Ankle Weight  - 1 x daily - 7 x weekly - 2 sets - 10 reps - 5 hold -  Seated March  - 2 x daily - 7 x weekly - 3 sets - 10 reps - Sit to Stand Without Arm Support  - 2 x  daily - 7 x weekly - 2 sets - 5-10 reps - Standing Tandem Balance with Counter Support  - 1 x daily - 7 x weekly - 5 reps - 30-60 hold - Standing Single Leg Stance with Counter Support  - 1 x daily - 7 x weekly - 1 sets - 5 reps - 30-60 hold - Standing Heel Raise with Support  - 1 x daily - 7 x weekly - 3 sets - 10 reps - 3 hold - Runner's Climb  - 2 x daily - 7 x weekly - 2-3 sets - 10 reps - 3-5 hold - Seated Hamstring Stretch  - 1 x daily - 7 x weekly - 1 sets - 3 reps - 20 hold - Standing Hip Flexor Stretch  - 2 x daily - 7 x weekly - 1 sets - 3 reps - 20 hold  ASSESSMENT: CLINICAL IMPRESSION: 20 th visit progress note sent to MD today. Pt able to ambulate in community with SPC 100 % of time, and at home only uses her SPC ~ 25% of time now. Her 6 mins walk test has greatly improved since eval from 552 ft with walker to 979 ft with SPC today. She reports RPE 2-3/10 after 6 min meeting that goal. Pt is overall progressing very well towards her unmet goals and is reporting feeling much improved with Rt LE strength and endurance with ADLs at home.   OBJECTIVE IMPAIRMENTS: Abnormal gait, decreased activity tolerance, decreased balance, decreased mobility, difficulty walking, decreased ROM, decreased strength, hypomobility, increased fascial restrictions, increased muscle spasms, impaired flexibility, and pain.   ACTIVITY LIMITATIONS: carrying, lifting, standing, squatting, stairs, transfers, dressing, hygiene/grooming, locomotion level, and caring for others  PARTICIPATION LIMITATIONS: meal prep, cleaning, laundry, driving, shopping, community activity, and church  PERSONAL FACTORS: Age, Time since onset of injury/illness/exacerbation, and 1 comorbidity: breast cancer  are also affecting patient's functional outcome.   REHAB POTENTIAL: Good  CLINICAL DECISION MAKING: Evolving/moderate complexity  EVALUATION COMPLEXITY: Moderate   GOALS: Goals reviewed with patient? Yes  SHORT TERM GOALS:  Target date: 03/20/2024   Be independent in initial HEP Baseline: 03/04/24 - Pt is independent with initial HEP Goal status: MET   2.  Perform sit to stand transition without UE support with neutral hips due to improved functional strength  Baseline: 03/04/24 - increased to 7 reps with 2/7 no UE support; 03/13/24 - with controlled motion pt able to perform correctly Goal status: MET  3.  Improve 6 min walk test to > or = to 750 feet to improve community distance  Baseline: 552 feet; 03/04/24 - 808 ft with walker Goal status: MET  4.  Report > or = to 30% reduction in Rt hip pain with daily tasks  Baseline: 03/04/24 - 30% with pain meds Goal status: MET    LONG TERM GOALS: Target date: 06/03/2024   Be independent in advanced HEP Baseline: independent in current HEP Goal status: ONGOING  2. Improved PSFS (patient specific functional scale) to > or = to 6/10 for getting on/off the floor to use foam roller  Baseline: 5/10 (04/08/24) Goal status: NEW  3.  Improve 6 min walk test to > or = to 1200 feet to improve community ambulation  Baseline: 1019 with cane (04/08/24); 04/23/24 - 979 ft with SPC (pt was slowed some today  by busyness of clinic) Goal status: ONGOING  4.  Wean from cane for all distances due to symmetry with ambulation on level surfaces  Baseline: antalgia without cane, mild antalgia with cane; 04/23/24 - pt ambulates 75% of time without SPC at home, and very mild to no antalgic gait with SPC Goal status: ONGOING   5.  Report < or = to 3/10 RPE with 6 min walk test due to improved endurance and strength  Baseline: 6-7/10; 04/23/24 - 2-3/10 after 6 min walk test today Goal status: MET   PLAN:  PT FREQUENCY: 2x/week  PT DURATION: 8 weeks  PLANNED INTERVENTIONS: 97110-Therapeutic exercises, 97530- Therapeutic activity, 97112- Neuromuscular re-education, 97535- Self Care, [id number]- Manual therapy, [phone]- Canalith repositioning, [id number]- Aquatic Therapy, [id number]-  Electrical stimulation (unattended), 20560 (1-2 muscles), 20561 (3+ muscles)- Dry Needling, Patient/Family education, Balance training, Stair training, Taping, Joint mobilization, Scar mobilization, Vestibular training, Cryotherapy, and Moist heat  PLAN FOR NEXT SESSION: Cont NuStep, cont gait with SPC,  work on functional tasks and mobility, cont high level balance tasks including stepping over obstacles with SPC as SLS is challenging and lifting Rt knee;  sit to stand with staggered stance,  floor transfers using floor mat  Berwyn Knights, PTA 04/23/24 12:07 PM     [facility] [address] [place], [place] [address] Phone # [phone] Fax [phone]

## 2024-04-25 ENCOUNTER — Ambulatory Visit

## 2024-04-25 DIAGNOSIS — M6281 Muscle weakness (generalized): Secondary | ICD-10-CM | POA: Diagnosis not present

## 2024-04-25 DIAGNOSIS — R2689 Other abnormalities of gait and mobility: Secondary | ICD-10-CM

## 2024-04-25 DIAGNOSIS — R293 Abnormal posture: Secondary | ICD-10-CM

## 2024-04-25 DIAGNOSIS — M79604 Pain in right leg: Secondary | ICD-10-CM

## 2024-04-25 NOTE — Therapy (Signed)
 OUTPATIENT PHYSICAL THERAPY LOWER EXTREMITY TREATMENT   Patient Name: Marie Day MRN: [id number] DOB:Feb 23, 1946, 78 y.o., female Today's Date: 04/25/2024     END OF SESSION:  PT End of Session - 04/25/24 1148     Visit Number 21    Date for Recertification  06/03/24    Authorization Type [employer]-no auth required    Progress Note Due on Visit 30    PT Start Time 1103    PT Stop Time 1146    PT Time Calculation (min) 43 min    Activity Tolerance Patient tolerated treatment well    Behavior During Therapy WFL for tasks assessed/performed                  Past Medical History:  Diagnosis Date   Arthritis    Breast cancer (HCC)    Cancer (HCC)    Past Surgical History:  Procedure Laterality Date   BREAST BIOPSY Right 01/30/2023   US  [name] BX W LOC DEV 1ST LESION IMG BX SPEC US  GUIDE 01/30/2023 [facility]   BREAST RECONSTRUCTION WITH PLACEMENT OF TISSUE EXPANDER AND FLEX HD (ACELLULAR HYDRATED DERMIS) Right 06/29/2021   Procedure: RIGHT BREAST RECONSTRUCTION WITH PLACEMENT OF TISSUE EXPANDER AND FLEX HD (ACELLULAR HYDRATED DERMIS);  Surgeon: Elisabeth Craig RAMAN, MD;  Location: [facility];  Service: Plastics;  Laterality: Right;   BREAST REDUCTION WITH MASTOPEXY Left 06/30/2022   Procedure: LEFT BREAST REDUCTION WITH MASTOPEXY;  Surgeon: Lowery Estefana RAMAN, DO;  Location: [facility];  Service: Plastics;  Laterality: Left;   INTRAMEDULLARY (IM) NAIL INTERTROCHANTERIC Right 12/10/2023   Procedure: FIXATION, FRACTURE, INTERTROCHANTERIC, WITH INTRAMEDULLARY ROD;  Surgeon: Georgina Ozell LABOR, MD;  Location: [facility];  Service: Orthopedics;  Laterality: Right;   MASTECTOMY Right 06/29/2021   MASTECTOMY W/ SENTINEL NODE BIOPSY Right 06/29/2021   Procedure: RIGHT MASTECTOMY WITH SENTINEL LYMPH NODE BIOPSY;  Surgeon: Vanderbilt Ned, MD;  Location: [facility];  Service: General;  Laterality: Right;   PORT-A-CATH REMOVAL Left  06/30/2022   Procedure: REMOVAL PORT-A-CATH;  Surgeon: Lowery Estefana RAMAN, DO;  Location: [facility];  Service: Plastics;  Laterality: Left;   REMOVAL OF TISSUE EXPANDER AND PLACEMENT OF IMPLANT Right 06/30/2022   Procedure: REMOVAL OF TISSUE EXPANDER AND PLACEMENT OF IMPLANT;  Surgeon: Lowery Estefana RAMAN, DO;  Location: [facility];  Service: Plastics;  Laterality: Right;   WISDOM TOOTH EXTRACTION     Patient Active Problem List   Diagnosis Date Noted   Osteoporosis 01/01/2024   Elevated antinuclear antibody (ANA) level 09/27/2023   Insomnia 09/18/2023   Dyslipidemia 03/24/2023   Body mass index (BMI) 24.0-24.9, adult 02/28/2023   Joint swelling 02/28/2023   Other specified abnormal immunological findings in serum 02/28/2023   Pain in limb 02/28/2023   Tinnitus 12/21/2022   Breast cancer (HCC) 12/21/2022   Joint stiffness 09/01/2022   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   Polyarthralgia 02/08/2022   S/P breast reconstruction 06/29/2021   Malignant neoplasm of upper-outer quadrant of right breast in female, estrogen receptor negative (HCC) 12/21/2020   Anemia 10/27/2020   Prediabetes 10/27/2020   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   Unspecified age-related cataract 12/14/2015    PCP: Kennyth Bart, MD  REFERRING PROVIDER: Georgina Ozell, MD  REFERRING DIAG:  Diagnosis  S72.141G (ICD-10-CM) - Displaced intertrochanteric fracture of right femur, subsequent encounter for closed fracture with delayed healing    THERAPY DIAG:  Muscle weakness (generalized)  Other abnormalities of gait and mobility  Pain in right leg  Abnormal posture  Rationale for Evaluation and Treatment: Rehabilitation  ONSET DATE: 12/10/23- fall with femur fracture   SUBJECTIVE:   SUBJECTIVE STATEMENT: I am doing well.    From MD note 01/25/24:  Patient states that she is doing well at this point. She is ambulating with a walker.  She is able to ambulate up and down stairs as well. She is not having any consistent pain in the hip. She is sparingly using hydrocodone  to control her pain when she does have it. She has not noticed any redness or drainage around her incisions.   PERTINENT HISTORY: Malignant neoplasm of upper-outer quadrant of right breast in female, estrogen receptor negative (HCC) 2023, IM rod placement  PAIN: 04/15/24 PAIN:  Are you having pain? 4-5/10, R hip, ache, medication helps, being still makes it worse  PRECAUTIONS: Other: breast cancer and mastectomy   RED FLAGS: None   WEIGHT BEARING RESTRICTIONS: No WBAT per MD   FALLS:  Has patient fallen in last 6 months? Yes. Number of falls 1 on 12/10/23 with IM rod placement on Rt  LIVING ENVIRONMENT: Lives with: lives with their family Lives in: House/apartment Stairs: Yes: Internal: 4 steps; on right going up Has following equipment at home: [name] - 2 wheeled  PLOF: Independent and Leisure: none  PATIENT GOALS: improve mobility, walk without walker  NEXT MD VISIT: 03/31/24  OBJECTIVE:  Note: Objective measures were completed at Evaluation unless otherwise noted.   COGNITION: Overall cognitive status: Within functional limits for tasks assessed     SENSATION: WFL   POSTURE: rounded shoulders, forward head, and weight shift left  PALPATION: Tender over incisions in the Rt anterior and lateral thigh  LOWER EXTREMITY ROM: WFLs   LOWER EXTREMITY MMT: Rt hip 4-/5, Lt 4+/5, bil knees 4+/5   FUNCTIONAL TESTS:  02/14/24 5 times sit to stand:with UE support 19.30 seconds  6 minute walk test: 552 feet with walker with 4/10 RPE -age related norm is 1250 feet  03/04/24:  7 times sit to stand (30 sec): 2 without UE support/ then remaining with UE support 6 minute walk test: 808 ft with walker with 2-3/10 RPE   04/08/24:  5x sit to stand: 9.59 seconds  6 min walk test: 1019 feet with cane- RPE 6-7/10 (age related norm is 1277  feet)  PSFS : getting on to floor to use foam roller: 5/10 (04/08/24)  GAIT: Distance walked: 50 Assistive device utilized: Environmental Consultant - 2 wheeled Level of assistance: Modified independence Comments: slow mobility, reduced step length                                                                                                                             TREATMENT DATE:   04/25/24: Therapeutic Exercises NuStep level 8 x 11 mins; UE 8/LE 8, 653 steps and PT present to monitor pt Therapeutic Activities/Neuro reed Leg Press: seat  6 85 # 2 x 20; then Rt 40# 4x10 and Lt 50# 2 x 20 each leg Lunge on to bosu ball: Rt LE with balance poles 2x10 on Rt  Hip Machine for hip 3 way raises into bil flex, abd and then ext 40# x 20 each  Sidestepping with band around thighs: red loop 25 ft x 2 Rt and Lt    04/23/24: Therapeutic Exercises NuStep level 8 x 10 mins; UE 8/LE 8, 527 steps and PTA present to monitor pt Leg Press 85 # 2 x 20; then Rt 40# 4x10 and Lt 50# 2 x 20 each leg Self Care Discussed pts current functional status for progress note and goal assess. Also discussed how she is doing with ambulating with SPC at home and in community.  Therapeutic Activities 6 min walk test with SPC x 979 ft (multiple obstacles in clinic today with many people that slowed pt at times), seated rest break after Standing with green theraband anchored:  hip into bil abd and then ext 2 x 12 each for stability   04/17/24: Therapeutic Exercises NuStep level 8 x 10 mins; UE 8/LE 8, 500 steps- PT present to discuss progress  Leg press seat 6, 80# x 20 bil, 85# x20; then Rt 40# 4x10 and Lt 45# 2 x 20 each leg Standing with green theraband anchored:  hip into bil abd and then ext 2x10 each Seated hamstring stretch 2x20 seconds bil Neuro Re Ed  Hurdles forward and lateral with min hand hold assistance -3 laps, sidestepping x 3 laps  Modified single leg stance with cone tap on mat table 2x10 taps  Sit to stand  with 10# kettlebell: parallel stance and staggered stance x10 each      PATIENT EDUCATION:  Education details: Access Code: [email or web address] Person educated: Patient Education method: Explanation, Demonstration, and Handouts Education comprehension: verbalized understanding and returned demonstration  HOME EXERCISE PROGRAM: Access Code: [email or web address] (use new access code), [id number] Access Code: [email or web address] URL: [email or web address] Date: 02/26/2024 Prepared by: Berwyn Knights  Exercises - Standing Hip Flexion with Resistance (Mirrored)  - 1 x daily - 7 x weekly - 1 sets - 10 reps - 3 hold - Standing Hip Extension with Resistance  - 1 x daily - 7 x weekly - 1 sets - 10 reps - 3 hold - Standing Hip Abduction with Theraband Resistance  - 1 x daily - 7 x weekly - 1 sets - 10 reps - 3 hold - Supine Bridge  - 1 x daily - 7 x weekly - 2 sets - 10 reps - 5 hold - Seated Long Arc Quad with Ankle Weight  - 1 x daily - 7 x weekly - 2 sets - 10 reps - 5 hold - Seated March  - 2 x daily - 7 x weekly - 3 sets - 10 reps - Sit to Stand Without Arm Support  - 2 x daily - 7 x weekly - 2 sets - 5-10 reps - Standing Tandem Balance with Counter Support  - 1 x daily - 7 x weekly - 5 reps - 30-60 hold - Standing Single Leg Stance with Counter Support  - 1 x daily - 7 x weekly - 1 sets - 5 reps - 30-60 hold - Standing Heel Raise with Support  - 1 x daily - 7 x weekly - 3 sets - 10 reps - 3 hold - Runner's Climb  - 2 x daily - 7 x weekly - 2-3 sets - 10 reps -  3-5 hold - Seated Hamstring Stretch  - 1 x daily - 7 x weekly - 1 sets - 3 reps - 20 hold - Standing Hip Flexor Stretch  - 2 x daily - 7 x weekly - 1 sets - 3 reps - 20 hold  ASSESSMENT: CLINICAL IMPRESSION: Pt is making steady improvements and is weaning from cane at home and has increased her distance with her 6 min walk test.  She did well with advanced balance tasks and required guarding at first and progressed to no guard required due to  improved stability.  Pt is overall progressing very well towards her unmet goals and is reporting feeling much improved with Rt LE strength and endurance with ADLs at home.   OBJECTIVE IMPAIRMENTS: Abnormal gait, decreased activity tolerance, decreased balance, decreased mobility, difficulty walking, decreased ROM, decreased strength, hypomobility, increased fascial restrictions, increased muscle spasms, impaired flexibility, and pain.   ACTIVITY LIMITATIONS: carrying, lifting, standing, squatting, stairs, transfers, dressing, hygiene/grooming, locomotion level, and caring for others  PARTICIPATION LIMITATIONS: meal prep, cleaning, laundry, driving, shopping, community activity, and church  PERSONAL FACTORS: Age, Time since onset of injury/illness/exacerbation, and 1 comorbidity: breast cancer  are also affecting patient's functional outcome.   REHAB POTENTIAL: Good  CLINICAL DECISION MAKING: Evolving/moderate complexity  EVALUATION COMPLEXITY: Moderate   GOALS: Goals reviewed with patient? Yes  SHORT TERM GOALS: Target date: 03/20/2024   Be independent in initial HEP Baseline: 03/04/24 - Pt is independent with initial HEP Goal status: MET   2.  Perform sit to stand transition without UE support with neutral hips due to improved functional strength  Baseline: 03/04/24 - increased to 7 reps with 2/7 no UE support; 03/13/24 - with controlled motion pt able to perform correctly Goal status: MET  3.  Improve 6 min walk test to > or = to 750 feet to improve community distance  Baseline: 552 feet; 03/04/24 - 808 ft with walker Goal status: MET  4.  Report > or = to 30% reduction in Rt hip pain with daily tasks  Baseline: 03/04/24 - 30% with pain meds Goal status: MET    LONG TERM GOALS: Target date: 06/03/2024   Be independent in advanced HEP Baseline: independent in current HEP Goal status: ONGOING  2. Improved PSFS (patient specific functional scale) to > or = to 6/10 for  getting on/off the floor to use foam roller  Baseline: 5/10 (04/08/24) Goal status: NEW  3.  Improve 6 min walk test to > or = to 1200 feet to improve community ambulation  Baseline: 1019 with cane (04/08/24); 04/23/24 - 979 ft with SPC (pt was slowed some today by busyness of clinic) Goal status: ONGOING  4.  Wean from cane for all distances due to symmetry with ambulation on level surfaces  Baseline: antalgia without cane, mild antalgia with cane; 04/23/24 - pt ambulates 75% of time without SPC at home, and very mild to no antalgic gait with SPC Goal status: ONGOING   5.  Report < or = to 3/10 RPE with 6 min walk test due to improved endurance and strength  Baseline: 6-7/10; 04/23/24 - 2-3/10 after 6 min walk test today Goal status: MET   PLAN:  PT FREQUENCY: 2x/week  PT DURATION: 8 weeks  PLANNED INTERVENTIONS: 97110-Therapeutic exercises, 97530- Therapeutic activity, 97112- Neuromuscular re-education, 97535- Self Care, 02859- Manual therapy, [phone]- Canalith repositioning, [id number]- Aquatic Therapy, [id number]- Electrical stimulation (unattended), 20560 (1-2 muscles), 20561 (3+ muscles)- Dry Needling, Patient/Family education, Balance training, Stair training, Taping,  Joint mobilization, Scar mobilization, Vestibular training, Cryotherapy, and Moist heat  PLAN FOR NEXT SESSION: ,  work on functional tasks and mobility, cont high level balance tasks including stepping over obstacles with SPC as SLS is challenging and lifting Rt knee;  sit to stand with staggered stance,  floor transfers using floor mat  Burnard Joy, PT 04/25/24 11:55 AM      [facility] [address] [place], [place] [address] Phone # [phone] Fax [phone]

## 2024-04-29 ENCOUNTER — Ambulatory Visit

## 2024-04-29 DIAGNOSIS — M79604 Pain in right leg: Secondary | ICD-10-CM

## 2024-04-29 DIAGNOSIS — R293 Abnormal posture: Secondary | ICD-10-CM

## 2024-04-29 DIAGNOSIS — R2689 Other abnormalities of gait and mobility: Secondary | ICD-10-CM

## 2024-04-29 DIAGNOSIS — M6281 Muscle weakness (generalized): Secondary | ICD-10-CM | POA: Diagnosis not present

## 2024-04-29 NOTE — Therapy (Signed)
 OUTPATIENT PHYSICAL THERAPY LOWER EXTREMITY TREATMENT   Patient Name: Marie Day MRN: [id number] DOB:[birth year], 78 y.o., female Today's Date: 04/29/2024     END OF SESSION:  PT End of Session - 04/29/24 1158     Visit Number 22    Date for Recertification  06/03/24    Authorization Type [employer]-no auth required    Progress Note Due on Visit 30    PT Start Time 1202    PT Stop Time 1250    PT Time Calculation (min) 48 min    Activity Tolerance Patient tolerated treatment well    Behavior During Therapy [facility] for tasks assessed/performed                  Past Medical History:  Diagnosis Date   Arthritis    Breast cancer (HCC)    Cancer (HCC)    Past Surgical History:  Procedure Laterality Date   BREAST BIOPSY Right 01/30/2023   US  [name] BX W LOC DEV 1ST LESION IMG BX SPEC US  GUIDE 01/30/2023 [facility]   BREAST RECONSTRUCTION WITH PLACEMENT OF TISSUE EXPANDER AND FLEX HD (ACELLULAR HYDRATED DERMIS) Right 06/29/2021   Procedure: RIGHT BREAST RECONSTRUCTION WITH PLACEMENT OF TISSUE EXPANDER AND FLEX HD (ACELLULAR HYDRATED DERMIS);  Surgeon: Elisabeth Craig RAMAN, MD;  Location: [facility];  Service: Plastics;  Laterality: Right;   BREAST REDUCTION WITH MASTOPEXY Left 06/30/2022   Procedure: LEFT BREAST REDUCTION WITH MASTOPEXY;  Surgeon: Lowery Estefana RAMAN, DO;  Location: [facility];  Service: Plastics;  Laterality: Left;   INTRAMEDULLARY (IM) NAIL INTERTROCHANTERIC Right 12/10/2023   Procedure: FIXATION, FRACTURE, INTERTROCHANTERIC, WITH INTRAMEDULLARY ROD;  Surgeon: Georgina Ozell LABOR, MD;  Location: [facility];  Service: Orthopedics;  Laterality: Right;   MASTECTOMY Right 06/29/2021   MASTECTOMY W/ SENTINEL NODE BIOPSY Right 06/29/2021   Procedure: RIGHT MASTECTOMY WITH SENTINEL LYMPH NODE BIOPSY;  Surgeon: Vanderbilt Ned, MD;  Location: [facility];  Service: General;  Laterality: Right;   PORT-A-CATH REMOVAL Left  06/30/2022   Procedure: REMOVAL PORT-A-CATH;  Surgeon: Lowery Estefana RAMAN, DO;  Location: [facility];  Service: Plastics;  Laterality: Left;   REMOVAL OF TISSUE EXPANDER AND PLACEMENT OF IMPLANT Right 06/30/2022   Procedure: REMOVAL OF TISSUE EXPANDER AND PLACEMENT OF IMPLANT;  Surgeon: Lowery Estefana RAMAN, DO;  Location:  [facility];  Service: Plastics;  Laterality: Right;   WISDOM TOOTH EXTRACTION     Patient Active Problem List   Diagnosis Date Noted   Osteoporosis 01/01/2024   Elevated antinuclear antibody (ANA) level 09/27/2023   Insomnia 09/18/2023   Dyslipidemia 03/24/2023   Body mass index (BMI) 24.0-24.9, adult 02/28/2023   Joint swelling 02/28/2023   Other specified abnormal immunological findings in serum 02/28/2023   Pain in limb 02/28/2023   Tinnitus 12/21/2022   Breast cancer (HCC) 12/21/2022   Joint stiffness 09/01/2022   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   Polyarthralgia 02/08/2022   S/P breast reconstruction 06/29/2021   Malignant neoplasm of upper-outer quadrant of right breast in female, estrogen receptor negative (HCC) 12/21/2020   Anemia 10/27/2020   Prediabetes 10/27/2020   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 I've only had 4 restless nights in the past 2 weeks. I've caught myself walking  around the houes without my cane and without an antalgic gait.   From MD note 01/25/24:  Patient states that she is doing well at this point. She is ambulating with a walker. She is able to ambulate up and down stairs as well. She is not having any consistent pain in the hip. She is sparingly using hydrocodone  to control her pain when she does have it. She has not noticed any redness or drainage around her incisions.   PERTINENT HISTORY: Malignant neoplasm of upper-outer quadrant of right breast in female, estrogen receptor negative (HCC) 2023, IM rod placement  PAIN: 04/15/24 PAIN:  Are you having pain? No, not currently  PRECAUTIONS: Other: breast cancer and mastectomy   RED FLAGS: None   WEIGHT BEARING RESTRICTIONS: No WBAT per MD   FALLS:  Has patient fallen in last 6 months? Yes. Number of falls 1 on 12/10/23 with IM rod placement on Rt  LIVING ENVIRONMENT: Lives with: lives with their family Lives in: House/apartment Stairs: Yes: Internal: 4 steps; on right going up Has following equipment at home: [name] - 2 wheeled  PLOF: Independent and Leisure: none  PATIENT GOALS: improve mobility, walk without walker  NEXT MD VISIT: 03/31/24  OBJECTIVE:  Note: Objective measures were completed at Evaluation unless otherwise noted.   COGNITION: Overall cognitive status: Within functional limits for tasks assessed     SENSATION: WFL   POSTURE: rounded shoulders, forward head, and weight shift left  PALPATION: Tender over incisions in the Rt anterior and lateral thigh  LOWER EXTREMITY ROM: WFLs   LOWER EXTREMITY MMT: Rt hip 4-/5, Lt 4+/5, bil knees 4+/5   FUNCTIONAL TESTS:  02/14/24 5 times sit to stand:with UE support 19.30 seconds  6 minute walk test: 552 feet with walker with 4/10 RPE -age related norm is 1250 feet  03/04/24:  7 times sit to stand (30 sec): 2 without UE support/ then remaining with UE support 6 minute walk test: 808 ft with walker with 2-3/10 RPE    04/08/24:  5x sit to stand: 9.59 seconds  6 min walk test: 1019 feet with cane- RPE 6-7/10 (age related norm is 1277 feet)  PSFS : getting on to floor to use foam roller: 5/10 (04/08/24)  GAIT: Distance walked: 50 Assistive device utilized: Environmental Consultant - 2 wheeled Level of assistance: Modified independence Comments: slow mobility, reduced step length                                                                                                                             TREATMENT DATE:   04/29/24: Therapeutic Exercises NuStep level  8 x 11 mins; UE 8/LE 8, 648 steps and PTA present to monitor pt and encourage steady pace throughout Leg Press: Bil 90# 2 x 20, Rt LE 45# 2 x 20, Lt LE 50# x 10, 55# x 10, x 20 Therapeutic Activities Practiced getting up off floor from mat. Pt able to do without using mat table and independently Staggered stance (Rt closer) for sit to stand 2 x 5 Up/down stairs x 4 reps starting with +1 UE support and then was able to perform with no UE support by end of reps.  Neuromuscular  Re Ed In // bars: Heel-toe walking front x 3 laps encouraging no UE support, and then retro x 4 laps, required a minimum of fingertip support with 1 hand only  04/25/24: Therapeutic Exercises NuStep level 8 x 11 mins; UE 8/LE 8, 653 steps and PT present to monitor pt Therapeutic Activities/Neuro reed Leg Press: seat 6 85 # 2 x 20; then Rt 40# 4x10 and Lt 50# 2 x 20 each leg Lunge on to bosu ball: Rt LE with balance poles 2x10 on Rt  Hip Machine for hip 3 way raises into bil flex, abd and then ext 40# x 20 each  Sidestepping with band around thighs: red loop 25 ft x 2 Rt and Lt    04/23/24: Therapeutic Exercises NuStep level 8 x 10 mins; UE 8/LE 8, 527 steps and PTA present to monitor pt Leg Press 85 # 2 x 20; then Rt 40# 4x10 and Lt 50# 2 x 20 each leg Self Care Discussed pts current functional status for progress note and goal assess. Also discussed how she is doing with  ambulating with SPC at home and in community.  Therapeutic Activities 6 min walk test with SPC x 979 ft (multiple obstacles in clinic today with many people that slowed pt at times), seated rest break after Standing with green theraband anchored:  hip into bil abd and then ext 2 x 12 each for stability   04/17/24: Therapeutic Exercises NuStep level 8 x 10 mins; UE 8/LE 8, 500 steps- PT present to discuss progress  Leg press seat 6, 80# x 20 bil, 85# x20; then Rt 40# 4x10 and Lt 45# 2 x 20 each leg Standing with green theraband anchored:  hip into bil abd and then ext 2x10 each Seated hamstring stretch 2x20 seconds bil Neuro Re Ed  Hurdles forward and lateral with min hand hold assistance -3 laps, sidestepping x 3 laps  Modified single leg stance with cone tap on mat table 2x10 taps  Sit to stand with 10# kettlebell: parallel stance and staggered stance x10 each      PATIENT EDUCATION:  Education details: Access Code: [email or web address] Person educated: Patient Education method: Explanation, Demonstration, and Handouts Education comprehension: verbalized understanding and returned demonstration  HOME EXERCISE PROGRAM: Access Code: [email or web address] (use new access code), [id number] Access Code: [email or web address] URL: [email or web address] Date: 02/26/2024 Prepared by: Berwyn Knights  Exercises - Standing Hip Flexion with Resistance (Mirrored)  - 1 x daily - 7 x weekly - 1 sets - 10 reps - 3 hold - Standing Hip Extension with Resistance  - 1 x daily - 7 x weekly - 1 sets - 10 reps - 3 hold - Standing Hip Abduction with Theraband Resistance  - 1 x daily - 7 x weekly - 1 sets - 10 reps - 3 hold - Supine Bridge  - 1 x daily - 7 x weekly - 2 sets - 10 reps -  5 hold - Seated Long Arc Quad with Ankle Weight  - 1 x daily - 7 x weekly - 2 sets - 10 reps - 5 hold - Seated March  - 2 x daily - 7 x weekly - 3 sets - 10 reps - Sit to Stand Without Arm Support  - 2 x daily - 7 x weekly - 2 sets - 5-10  reps - Standing Tandem Balance with Counter Support  - 1 x daily - 7 x weekly - 5 reps - 30-60 hold - Standing Single Leg Stance with Counter Support  - 1 x daily - 7 x weekly - 1 sets - 5 reps - 30-60 hold - Standing Heel Raise with Support  - 1 x daily - 7 x weekly - 3 sets - 10 reps - 3 hold - Runner's Climb  - 2 x daily - 7 x weekly - 2-3 sets - 10 reps - 3-5 hold - Seated Hamstring Stretch  - 1 x daily - 7 x weekly - 1 sets - 3 reps - 20 hold - Standing Hip Flexor Stretch  - 2 x daily - 7 x weekly - 1 sets - 3 reps - 20 hold  ASSESSMENT: CLINICAL IMPRESSION: Challenged pt with practicing getting up from floor and she was able to transition very well due to increased bil LE strength. She was very encouraged by this. Also added progression of sit to stand with staggered stance and pt reports this challenging but was able to perform. Ambulated up/down stairs with +1 to no UE support x 4 laps, by end of reps she was able to complete no UE support due to increased confidence. She also tolerated increased weights with leg press and requires less UE support with high level balance activities.   OBJECTIVE IMPAIRMENTS: Abnormal gait, decreased activity tolerance, decreased balance, decreased mobility, difficulty walking, decreased ROM, decreased strength, hypomobility, increased fascial restrictions, increased muscle spasms, impaired flexibility, and pain.   ACTIVITY LIMITATIONS: carrying, lifting, standing, squatting, stairs, transfers, dressing, hygiene/grooming, locomotion level, and caring for others  PARTICIPATION LIMITATIONS: meal prep, cleaning, laundry, driving, shopping, community activity, and church  PERSONAL FACTORS: Age, Time since onset of injury/illness/exacerbation, and 1 comorbidity: breast cancer  are also affecting patient's functional outcome.   REHAB POTENTIAL: Good  CLINICAL DECISION MAKING: Evolving/moderate complexity  EVALUATION COMPLEXITY: Moderate   GOALS: Goals  reviewed with patient? Yes  SHORT TERM GOALS: Target date: 03/20/2024   Be independent in initial HEP Baseline: 03/04/24 - Pt is independent with initial HEP Goal status: MET   2.  Perform sit to stand transition without UE support with neutral hips due to improved functional strength  Baseline: 03/04/24 - increased to 7 reps with 2/7 no UE support; 03/13/24 - with controlled motion pt able to perform correctly Goal status: MET  3.  Improve 6 min walk test to > or = to 750 feet to improve community distance  Baseline: 552 feet; 03/04/24 - 808 ft with walker Goal status: MET  4.  Report > or = to 30% reduction in Rt hip pain with daily tasks  Baseline: 03/04/24 - 30% with pain meds Goal status: MET    LONG TERM GOALS: Target date: 06/03/2024   Be independent in advanced HEP Baseline: independent in current HEP Goal status: ONGOING  2. Improved PSFS (patient specific functional scale) to > or = to 6/10 for getting on/off the floor to use foam roller  Baseline: 5/10 (04/08/24) Goal status: NEW  3.  Improve 6 min walk test to > or = to 1200 feet to improve community ambulation  Baseline: 1019 with cane (04/08/24); 04/23/24 - 979 ft with SPC (pt was slowed some today by busyness of clinic) Goal status: ONGOING  4.  Wean from cane for all distances due to symmetry with ambulation on level surfaces  Baseline: antalgia without cane, mild antalgia with cane; 04/23/24 - pt ambulates 75% of time without SPC at home, and very mild to no antalgic gait with SPC Goal status: ONGOING   5.  Report < or = to 3/10 RPE with 6 min walk test due to improved endurance and strength  Baseline: 6-7/10; 04/23/24 - 2-3/10 after 6 min walk test today Goal status: MET   PLAN:  PT FREQUENCY: 2x/week  PT DURATION: 8 weeks  PLANNED INTERVENTIONS: 97110-Therapeutic exercises, 97530- Therapeutic activity, 97112- Neuromuscular re-education, 97535- Self Care, 02859- Manual therapy, [phone]- Canalith  repositioning, [id number]- Aquatic Therapy, [id number]- Electrical stimulation (unattended), 20560 (1-2 muscles), 20561 (3+ muscles)- Dry Needling, Patient/Family education, Balance training, Stair training, Taping, Joint mobilization, Scar mobilization, Vestibular training, Cryotherapy, and Moist heat  PLAN FOR NEXT SESSION: Cont to work on functional tasks and mobility, cont high level balance tasks including stepping over obstacles with SPC as SLS is challenging and lifting Rt knee; cont sit to stand with staggered stance working towards Rt single leg sit to stand if able.   Berwyn Knights, PTA 04/29/24 12:57 PM   [facility] [address], [address] [place], [place] [address] Phone # [phone] Fax [phone]

## 2024-05-01 ENCOUNTER — Ambulatory Visit

## 2024-05-01 DIAGNOSIS — M6281 Muscle weakness (generalized): Secondary | ICD-10-CM

## 2024-05-01 DIAGNOSIS — R293 Abnormal posture: Secondary | ICD-10-CM

## 2024-05-01 DIAGNOSIS — M79604 Pain in right leg: Secondary | ICD-10-CM

## 2024-05-01 DIAGNOSIS — R2689 Other abnormalities of gait and mobility: Secondary | ICD-10-CM

## 2024-05-01 NOTE — Therapy (Signed)
 OUTPATIENT PHYSICAL THERAPY LOWER EXTREMITY TREATMENT   Patient Name: Marie Day MRN: [id number] DOB:1945-09-19, 78 y.o., female Today's Date: 05/01/2024     END OF SESSION:  PT End of Session - 05/01/24 1317     Visit Number 23    Date for Recertification  06/03/24    Authorization Type [employer]-no auth required    Progress Note Due on Visit 30    PT Start Time 1232    PT Stop Time 1316    PT Time Calculation (min) 44 min    Activity Tolerance Patient tolerated treatment well    Behavior During Therapy [facility] for tasks assessed/performed                   Past Medical History:  Diagnosis Date   Arthritis    Breast cancer (HCC)    Cancer (HCC)    Past Surgical History:  Procedure Laterality Date   BREAST BIOPSY Right 01/30/2023   US  [name] BX W LOC DEV 1ST LESION IMG BX SPEC US  GUIDE 01/30/2023 [facility] MAMMOGRAPHY   BREAST RECONSTRUCTION WITH PLACEMENT OF TISSUE EXPANDER AND FLEX HD (ACELLULAR HYDRATED DERMIS) Right 06/29/2021   Procedure: RIGHT BREAST RECONSTRUCTION WITH PLACEMENT OF TISSUE EXPANDER AND FLEX HD (ACELLULAR HYDRATED DERMIS);  Surgeon: Elisabeth Craig RAMAN, MD;  Location: [facility];  Service: Plastics;  Laterality: Right;   BREAST REDUCTION WITH MASTOPEXY Left 06/30/2022   Procedure: LEFT BREAST REDUCTION WITH MASTOPEXY;  Surgeon: Lowery Estefana RAMAN, DO;  Location: [facility];  Service: Plastics;  Laterality: Left;   INTRAMEDULLARY (IM) NAIL INTERTROCHANTERIC Right 12/10/2023   Procedure: FIXATION, FRACTURE, INTERTROCHANTERIC, WITH INTRAMEDULLARY ROD;  Surgeon: Georgina Ozell LABOR, MD;  Location: [facility];  Service: Orthopedics;  Laterality: Right;   MASTECTOMY Right 06/29/2021   MASTECTOMY W/ SENTINEL NODE BIOPSY Right 06/29/2021   Procedure: RIGHT MASTECTOMY WITH SENTINEL LYMPH NODE BIOPSY;  Surgeon: Vanderbilt Ned, MD;  Location: [facility];  Service: General;  Laterality: Right;   PORT-A-CATH REMOVAL  Left 06/30/2022   Procedure: REMOVAL PORT-A-CATH;  Surgeon: Lowery Estefana RAMAN, DO;  Location: [facility];  Service: Plastics;  Laterality: Left;   REMOVAL OF TISSUE EXPANDER AND PLACEMENT OF IMPLANT Right 06/30/2022   Procedure: REMOVAL OF TISSUE EXPANDER AND PLACEMENT OF IMPLANT;  Surgeon: Lowery Estefana RAMAN, DO;  Location: [facility];  Service: Plastics;  Laterality: Right;   WISDOM TOOTH EXTRACTION     Patient Active Problem List   Diagnosis Date Noted   Osteoporosis 01/01/2024   Elevated antinuclear antibody (ANA) level 09/27/2023   Insomnia 09/18/2023   Dyslipidemia 03/24/2023   Body mass index (BMI) 24.0-24.9, adult 02/28/2023   Joint swelling 02/28/2023   Other specified abnormal immunological findings in serum 02/28/2023   Pain in limb 02/28/2023   Tinnitus 12/21/2022   Breast cancer (HCC) 12/21/2022   Joint stiffness 09/01/2022   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   Polyarthralgia 02/08/2022   S/P breast reconstruction 06/29/2021   Malignant neoplasm of upper-outer quadrant of right breast in female, estrogen receptor negative (HCC) 12/21/2020   Anemia 10/27/2020   Prediabetes 10/27/2020   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   Unspecified age-related cataract 12/14/2015    PCP: Kennyth Bart, MD  REFERRING PROVIDER: Georgina Ozell, MD  REFERRING DIAG:  Diagnosis  S72.141G (ICD-10-CM) - Displaced intertrochanteric fracture of right femur, subsequent encounter for closed fracture with delayed healing    THERAPY DIAG:  Muscle weakness (  generalized)  Other abnormalities of gait and mobility  Pain in right leg  Abnormal posture  Rationale for Evaluation and Treatment: Rehabilitation  ONSET DATE: 12/10/23- fall with femur fracture   SUBJECTIVE:   SUBJECTIVE STATEMENT: Some days I feel really strong, and other days my quad feels like it gives out  From MD note 01/25/24:  Patient states that  she is doing well at this point. She is ambulating with a walker. She is able to ambulate up and down stairs as well. She is not having any consistent pain in the hip. She is sparingly using hydrocodone  to control her pain when she does have it. She has not noticed any redness or drainage around her incisions.   PERTINENT HISTORY: Malignant neoplasm of upper-outer quadrant of right breast in female, estrogen receptor negative (HCC) 2023, IM rod placement  PAIN: 04/15/24 PAIN:  Are you having pain? No, not currently  PRECAUTIONS: Other: breast cancer and mastectomy   RED FLAGS: None   WEIGHT BEARING RESTRICTIONS: No WBAT per MD   FALLS:  Has patient fallen in last 6 months? Yes. Number of falls 1 on 12/10/23 with IM rod placement on Rt  LIVING ENVIRONMENT: Lives with: lives with their family Lives in: House/apartment Stairs: Yes: Internal: 4 steps; on right going up Has following equipment at home: [name] - 2 wheeled  PLOF: Independent and Leisure: none  PATIENT GOALS: improve mobility, walk without walker  NEXT MD VISIT: 03/31/24  OBJECTIVE:  Note: Objective measures were completed at Evaluation unless otherwise noted.   COGNITION: Overall cognitive status: Within functional limits for tasks assessed     SENSATION: WFL   POSTURE: rounded shoulders, forward head, and weight shift left  PALPATION: Tender over incisions in the Rt anterior and lateral thigh  LOWER EXTREMITY ROM: WFLs   LOWER EXTREMITY MMT: Rt hip 4-/5, Lt 4+/5, bil knees 4+/5   FUNCTIONAL TESTS:  02/14/24 5 times sit to stand:with UE support 19.30 seconds  6 minute walk test: 552 feet with walker with 4/10 RPE -age related norm is 1250 feet  03/04/24:  7 times sit to stand (30 sec): 2 without UE support/ then remaining with UE support 6 minute walk test: 808 ft with walker with 2-3/10 RPE   04/08/24:  5x sit to stand: 9.59 seconds  6 min walk test: 1019 feet with cane- RPE 6-7/10 (age related  norm is 1277 feet)  PSFS : getting on to floor to use foam roller: 5/10 (04/08/24)  GAIT: Distance walked: 50 Assistive device utilized: Environmental Consultant - 2 wheeled Level of assistance: Modified independence Comments: slow mobility, reduced step length                                                                                                                             TREATMENT DATE:   05/01/24: Therapeutic Exercises NuStep level 8 x 10 mins; UE 8/LE 8, 640 steps and PT present to monitor pt and encourage  steady pace throughout Leg Press: Bil 90# 2 x 20, Rt LE 45# 2 x 20, 55# x 20 Therapeutic Activities Staggered stance - sit to stand 10# kettlebell 2x10 each  Neuromuscular  Re Ed In // bars: Heel-toe walking front x 3 laps encouraging no UE support, and then retro x 4 laps, required a minimum of fingertip support with 1 hand only Farmer's carry: 10# lap around building- 1 lap for each hand    04/29/24: Therapeutic Exercises NuStep level 8 x 11 mins; UE 8/LE 8, 648 steps and PTA present to monitor pt and encourage steady pace throughout Leg Press: Bil 90# 2 x 20, Rt LE 45# 2 x 20, Lt LE 50# x 10, 55# x 10, x 20 Therapeutic Activities Practiced getting up off floor from mat. Pt able to do without using mat table and independently Staggered stance (Rt closer) for sit to stand 2 x 5 Up/down stairs x 4 reps starting with +1 UE support and then was able to perform with no UE support by end of reps.  Neuromuscular  Re Ed In // bars: Heel-toe walking front x 3 laps encouraging no UE support, and then retro x 4 laps, required a minimum of fingertip support with 1 hand only  04/25/24: Therapeutic Exercises NuStep level 8 x 11 mins; UE 8/LE 8, 653 steps and PT present to monitor pt Therapeutic Activities/Neuro reed Leg Press: seat 6 85 # 2 x 20; then Rt 40# 4x10 and Lt 50# 2 x 20 each leg Lunge on to bosu ball: Rt LE with balance poles 2x10 on Rt  Hip Machine for hip 3 way raises into bil  flex, abd and then ext 40# x 20 each  Sidestepping with band around thighs: red loop 25 ft x 2 Rt and Lt    PATIENT EDUCATION:  Education details: Access Code: [email or web address] Person educated: Patient Education method: Explanation, Demonstration, and Handouts Education comprehension: verbalized understanding and returned demonstration  HOME EXERCISE PROGRAM: Access Code: [email or web address] (use new access code), [id number] Access Code: [email or web address] URL: [email or web address] Date: 02/26/2024 Prepared by: Berwyn Knights  Exercises - Standing Hip Flexion with Resistance (Mirrored)  - 1 x daily - 7 x weekly - 1 sets - 10 reps - 3 hold - Standing Hip Extension with Resistance  - 1 x daily - 7 x weekly - 1 sets - 10 reps - 3 hold - Standing Hip Abduction with Theraband Resistance  - 1 x daily - 7 x weekly - 1 sets - 10 reps - 3 hold - Supine Bridge  - 1 x daily - 7 x weekly - 2 sets - 10 reps - 5 hold - Seated Long Arc Quad with Ankle Weight  - 1 x daily - 7 x weekly - 2 sets - 10 reps - 5 hold - Seated March  - 2 x daily - 7 x weekly - 3 sets - 10 reps - Sit to Stand Without Arm Support  - 2 x daily - 7 x weekly - 2 sets - 5-10 reps - Standing Tandem Balance with Counter Support  - 1 x daily - 7 x weekly - 5 reps - 30-60 hold - Standing Single Leg Stance with Counter Support  - 1 x daily - 7 x weekly - 1 sets - 5 reps - 30-60 hold - Standing Heel Raise with Support  - 1 x daily - 7 x weekly - 3 sets - 10 reps - 3 hold - Runner's Climb  - 2 x  daily - 7 x weekly - 2-3 sets - 10 reps - 3-5 hold - Seated Hamstring Stretch  - 1 x daily - 7 x weekly - 1 sets - 3 reps - 20 hold - Standing Hip Flexor Stretch  - 2 x daily - 7 x weekly - 1 sets - 3 reps - 20 hold  ASSESSMENT: CLINICAL IMPRESSION: Challenged pt with practicing getting up from floor this week.  Also added progression of sit to stand with staggered stance this week and added weight today. Good symmetry with farmer's carry around clinic  and PT provided verbal cueing for gait pattern and alignment. She also tolerated increased weights with leg press and requires less UE support with high level balance activities.   OBJECTIVE IMPAIRMENTS: Abnormal gait, decreased activity tolerance, decreased balance, decreased mobility, difficulty walking, decreased ROM, decreased strength, hypomobility, increased fascial restrictions, increased muscle spasms, impaired flexibility, and pain.   ACTIVITY LIMITATIONS: carrying, lifting, standing, squatting, stairs, transfers, dressing, hygiene/grooming, locomotion level, and caring for others  PARTICIPATION LIMITATIONS: meal prep, cleaning, laundry, driving, shopping, community activity, and church  PERSONAL FACTORS: Age, Time since onset of injury/illness/exacerbation, and 1 comorbidity: breast cancer  are also affecting patient's functional outcome.   REHAB POTENTIAL: Good  CLINICAL DECISION MAKING: Evolving/moderate complexity  EVALUATION COMPLEXITY: Moderate   GOALS: Goals reviewed with patient? Yes  SHORT TERM GOALS: Target date: 03/20/2024   Be independent in initial HEP Baseline: 03/04/24 - Pt is independent with initial HEP Goal status: MET   2.  Perform sit to stand transition without UE support with neutral hips due to improved functional strength  Baseline: 03/04/24 - increased to 7 reps with 2/7 no UE support; 03/13/24 - with controlled motion pt able to perform correctly Goal status: MET  3.  Improve 6 min walk test to > or = to 750 feet to improve community distance  Baseline: 552 feet; 03/04/24 - 808 ft with walker Goal status: MET  4.  Report > or = to 30% reduction in Rt hip pain with daily tasks  Baseline: 03/04/24 - 30% with pain meds Goal status: MET    LONG TERM GOALS: Target date: 06/03/2024   Be independent in advanced HEP Baseline: independent in current HEP Goal status: ONGOING  2. Improved PSFS (patient specific functional scale) to > or = to  6/10 for getting on/off the floor to use foam roller  Baseline: 5/10 (04/08/24) Goal status: NEW  3.  Improve 6 min walk test to > or = to 1200 feet to improve community ambulation  Baseline: 1019 with cane (04/08/24); 04/23/24 - 979 ft with SPC (pt was slowed some today by busyness of clinic) Goal status: ONGOING  4.  Wean from cane for all distances due to symmetry with ambulation on level surfaces  Baseline: antalgia without cane, mild antalgia with cane; 04/23/24 - pt ambulates 75% of time without SPC at home, and very mild to no antalgic gait with SPC Goal status: ONGOING   5.  Report < or = to 3/10 RPE with 6 min walk test due to improved endurance and strength  Baseline: 6-7/10; 04/23/24 - 2-3/10 after 6 min walk test today Goal status: MET   PLAN:  PT FREQUENCY: 2x/week  PT DURATION: 8 weeks  PLANNED INTERVENTIONS: 97110-Therapeutic exercises, 97530- Therapeutic activity, 97112- Neuromuscular re-education, 97535- Self Care, 02859- Manual therapy, [phone]- Canalith repositioning, [id number]- Aquatic Therapy, [id number]- Electrical stimulation (unattended), 20560 (1-2 muscles), 20561 (3+ muscles)- Dry Needling, Patient/Family education, Balance training, Stair training,  Taping, Joint mobilization, Scar mobilization, Vestibular training, Cryotherapy, and Moist heat  PLAN FOR NEXT SESSION: Cont to work on functional tasks and mobility, cont high level balance tasks including stepping over obstacles with SPC as SLS is challenging and lifting Rt knee; cont sit to stand with staggered stance working towards Rt single leg sit to stand if able.   Burnard Joy, PT 05/01/24 1:18 PM    [facility] [address], [address] [place], [place] [address] Phone # [phone] Fax [phone]

## 2024-05-06 ENCOUNTER — Ambulatory Visit

## 2024-05-06 DIAGNOSIS — M79604 Pain in right leg: Secondary | ICD-10-CM

## 2024-05-06 DIAGNOSIS — M6281 Muscle weakness (generalized): Secondary | ICD-10-CM | POA: Diagnosis not present

## 2024-05-06 DIAGNOSIS — R2689 Other abnormalities of gait and mobility: Secondary | ICD-10-CM

## 2024-05-06 DIAGNOSIS — R293 Abnormal posture: Secondary | ICD-10-CM

## 2024-05-06 NOTE — Therapy (Signed)
 OUTPATIENT PHYSICAL THERAPY LOWER EXTREMITY TREATMENT   Patient Name: Marie Day MRN: [id number] DOB:06/09/1945, 78 y.o., female Today's Date: 05/06/2024     END OF SESSION:  PT End of Session - 05/06/24 1304     Visit Number 24    Date for Recertification  06/03/24    Authorization Type [employer]-no auth required    Progress Note Due on Visit 30    PT Start Time 1217    PT Stop Time 1305    PT Time Calculation (min) 48 min    Activity Tolerance Patient tolerated treatment well    Behavior During Therapy [facility] for tasks assessed/performed                    Past Medical History:  Diagnosis Date   Arthritis    Breast cancer (HCC)    Cancer (HCC)    Past Surgical History:  Procedure Laterality Date   BREAST BIOPSY Right 01/30/2023   US  [name] BX W LOC DEV 1ST LESION IMG BX SPEC US  GUIDE 01/30/2023 [facility]AMMOGRAP[facility]   BREAST RECONSTRUCTION WITH PLACEMENT OF TISSUE EXPANDER AND FLEX HD (ACELLULAR HYDRATED DERMIS) Right 06/29/2021   Procedure: RIGHT BREAST RECONSTRUCTION WITH PLACEMENT OF TISSUE EXPANDER AND FLEX HD (ACELLULAR HYDRATED DERMIS);  Surgeon: Elisabeth Craig RAMAN, MD;  Location: [facility];  Service: Plastics;  Laterality: Right;   BREAST REDUCTION WITH MASTOPEXY Left 06/30/2022   Procedure: LEFT BREAST REDUCTION WITH MASTOPEXY;  Surgeon: Lowery Estefana RAMAN, DO;  Location: [facility];  Service: Plastics;  Laterality: Left;   INTRAMEDULLARY (IM) NAIL INTERTROCHANTERIC Right 12/10/2023   Procedure: FIXATION, FRACTURE, INTERTROCHANTERIC, WITH INTRAMEDULLARY ROD;  Surgeon: Georgina Ozell LABOR, MD;  Location: [facility];  Service: Orthopedics;  Laterality: Right;   MASTECTOMY Right 06/29/2021   MASTECTOMY W/ SENTINEL NODE BIOPSY Right 06/29/2021   Procedure: RIGHT MASTECTOMY WITH SENTINEL LYMPH NODE BIOPSY;  Surgeon: Vanderbilt Ned, MD;  Location: [facility];  Service: General;  Laterality: Right;   PORT-A-CATH REMOVAL  Left 06/30/2022   Procedure: REMOVAL PORT-A-CATH;  Surgeon: Lowery Estefana RAMAN, DO;  Location: [facility];  Service: Plastics;  Laterality: Left;   REMOVAL OF TISSUE EXPANDER AND PLACEMENT OF IMPLANT Right 06/30/2022   Procedure: REMOVAL OF TISSUE EXPANDER AND PLACEMENT OF IMPLANT;  Surgeon: Lowery Estefana RAMAN, DO;  Location: [facility];  Service: Plastics;  Laterality: Right;   WISDOM TOOTH EXTRACTION     Patient Active Problem List   Diagnosis Date Noted   Osteoporosis 01/01/2024   Elevated antinuclear antibody (ANA) level 09/27/2023   Insomnia 09/18/2023   Dyslipidemia 03/24/2023   Body mass index (BMI) 24.0-24.9, adult 02/28/2023   Joint swelling 02/28/2023   Other specified abnormal immunological findings in serum 02/28/2023   Pain in limb 02/28/2023   Tinnitus 12/21/2022   Breast cancer (HCC) 12/21/2022   Joint stiffness 09/01/2022   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   Polyarthralgia 02/08/2022   S/P breast reconstruction 06/29/2021   Malignant neoplasm of upper-outer quadrant of right breast in female, estrogen receptor negative (HCC) 12/21/2020   Anemia 10/27/2020   Prediabetes 10/27/2020   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   Unspecified age-related cataract 12/14/2015    PCP: Kennyth Bart, MD  REFERRING PROVIDER: Georgina Ozell, MD  REFERRING DIAG:  Diagnosis  S72.141G (ICD-10-CM) - Displaced intertrochanteric fracture of right femur, subsequent encounter for closed fracture with delayed healing    THERAPY DIAG:  Muscle  weakness (generalized)  Other abnormalities of gait and mobility  Pain in right leg  Abnormal posture  Rationale for Evaluation and Treatment: Rehabilitation  ONSET DATE: 12/10/23- fall with femur fracture   SUBJECTIVE:   SUBJECTIVE STATEMENT: I went to church yesterday and just carried my cane.    From MD note 01/25/24:  Patient states that she is doing well at  this point. She is ambulating with a walker. She is able to ambulate up and down stairs as well. She is not having any consistent pain in the hip. She is sparingly using hydrocodone  to control her pain when she does have it. She has not noticed any redness or drainage around her incisions.   PERTINENT HISTORY: Malignant neoplasm of upper-outer quadrant of right breast in female, estrogen receptor negative (HCC) 2023, IM rod placement  PAIN: 04/15/24 PAIN:  Are you having pain? No, not currently  PRECAUTIONS: Other: breast cancer and mastectomy   RED FLAGS: None   WEIGHT BEARING RESTRICTIONS: No WBAT per MD   FALLS:  Has patient fallen in last 6 months? Yes. Number of falls 1 on 12/10/23 with IM rod placement on Rt  LIVING ENVIRONMENT: Lives with: lives with their family Lives in: House/apartment Stairs: Yes: Internal: 4 steps; on right going up Has following equipment at home: [name] - 2 wheeled  PLOF: Independent and Leisure: none  PATIENT GOALS: improve mobility, walk without walker  NEXT MD VISIT: 03/31/24  OBJECTIVE:  Note: Objective measures were completed at Evaluation unless otherwise noted.   COGNITION: Overall cognitive status: Within functional limits for tasks assessed     SENSATION: WFL   POSTURE: rounded shoulders, forward head, and weight shift left  PALPATION: Tender over incisions in the Rt anterior and lateral thigh  LOWER EXTREMITY ROM: WFLs   LOWER EXTREMITY MMT: Rt hip 4-/5, Lt 4+/5, bil knees 4+/5   FUNCTIONAL TESTS:  02/14/24 5 times sit to stand:with UE support 19.30 seconds  6 minute walk test: 552 feet with walker with 4/10 RPE -age related norm is 1250 feet  03/04/24:  7 times sit to stand (30 sec): 2 without UE support/ then remaining with UE support 6 minute walk test: 808 ft with walker with 2-3/10 RPE   04/08/24:  5x sit to stand: 9.59 seconds  6 min walk test: 1019 feet with cane- RPE 6-7/10 (age related norm is 1277  feet)  PSFS : getting on to floor to use foam roller: 5/10 (04/08/24)  GAIT: Distance walked: 50 Assistive device utilized: Environmental Consultant - 2 wheeled Level of assistance: Modified independence Comments: slow mobility, reduced step length                                                                                                                             TREATMENT DATE:   05/06/24: Therapeutic Exercises NuStep level 8 x 10 mins; UE 8/LE 8, 640 steps and PT present to monitor pt and encourage steady pace throughout  Leg Press: seat 6 Bil 90# 2 x 20, Rt LE 40# 2 x 20, 55# x 20 Therapeutic Activities Staggered stance - sit to stand 10# kettlebell x10 each  Parallel stance: 10# x10 Neuromuscular  Re Ed In // bars: Heel walking x2 laps at barre, toe walking at barre with 1 UE support as needed Farmer's carry: 10# lap around building- 1 lap for each hand Farmer's hold 10# KB with alternating step taps on 6 step x20 each   05/01/24: Therapeutic Exercises NuStep level 8 x 10 mins; UE 8/LE 8, 640 steps and PT present to monitor pt and encourage steady pace throughout Leg Press: Bil 90# 2 x 20, Rt LE 45# 2 x 20, 55# x 20 Therapeutic Activities Staggered stance - sit to stand 10# kettlebell 2x10 each  Neuromuscular  Re Ed In // bars: Heel-toe walking front x 3 laps encouraging no UE support, and then retro x 4 laps, required a minimum of fingertip support with 1 hand only Farmer's carry: 10# lap around building- 1 lap for each hand    04/29/24: Therapeutic Exercises NuStep level 8 x 11 mins; UE 8/LE 8, 648 steps and PTA present to monitor pt and encourage steady pace throughout Leg Press: Bil 90# 2 x 20, Rt LE 45# 2 x 20, Lt LE 50# x 10, 55# x 10, x 20 Therapeutic Activities Practiced getting up off floor from mat. Pt able to do without using mat table and independently Staggered stance (Rt closer) for sit to stand 2 x 5 Up/down stairs x 4 reps starting with +1 UE support and then was  able to perform with no UE support by end of reps.  Neuromuscular  Re Ed In // bars: Heel-toe walking front x 3 laps encouraging no UE support, and then retro x 4 laps, required a minimum of fingertip support with 1 hand only     PATIENT EDUCATION:  Education details: Access Code: [email or web address] Person educated: Patient Education method: Explanation, Demonstration, and Handouts Education comprehension: verbalized understanding and returned demonstration  HOME EXERCISE PROGRAM: Access Code: [email or web address] (use new access code), [id number] Access Code: [email or web address] URL: [email or web address] Date: 02/26/2024 Prepared by: Berwyn Knights  Exercises - Standing Hip Flexion with Resistance (Mirrored)  - 1 x daily - 7 x weekly - 1 sets - 10 reps - 3 hold - Standing Hip Extension with Resistance  - 1 x daily - 7 x weekly - 1 sets - 10 reps - 3 hold - Standing Hip Abduction with Theraband Resistance  - 1 x daily - 7 x weekly - 1 sets - 10 reps - 3 hold - Supine Bridge  - 1 x daily - 7 x weekly - 2 sets - 10 reps - 5 hold - Seated Long Arc Quad with Ankle Weight  - 1 x daily - 7 x weekly - 2 sets - 10 reps - 5 hold - Seated March  - 2 x daily - 7 x weekly - 3 sets - 10 reps - Sit to Stand Without Arm Support  - 2 x daily - 7 x weekly - 2 sets - 5-10 reps - Standing Tandem Balance with Counter Support  - 1 x daily - 7 x weekly - 5 reps - 30-60 hold - Standing Single Leg Stance with Counter Support  - 1 x daily - 7 x weekly - 1 sets - 5 reps - 30-60 hold - Standing Heel Raise with Support  - 1 x daily - 7 x weekly -  3 sets - 10 reps - 3 hold - Runner's Climb  - 2 x daily - 7 x weekly - 2-3 sets - 10 reps - 3-5 hold - Seated Hamstring Stretch  - 1 x daily - 7 x weekly - 1 sets - 3 reps - 20 hold - Standing Hip Flexor Stretch  - 2 x daily - 7 x weekly - 1 sets - 3 reps - 20 hold  ASSESSMENT: CLINICAL IMPRESSION: Pt is weaning from cane at home due to improved strength and stability. Good  symmetry with farmer's carry around clinic and PT provided verbal cueing for gait pattern and alignment. Reduced time on Rt LE with weight on the Rt and she was able to correct with verbal cues. PT monitored throughout session for safety and stability and cueing.  Patient will benefit from skilled PT to address the below impairments and improve overall function.   OBJECTIVE IMPAIRMENTS: Abnormal gait, decreased activity tolerance, decreased balance, decreased mobility, difficulty walking, decreased ROM, decreased strength, hypomobility, increased fascial restrictions, increased muscle spasms, impaired flexibility, and pain.   ACTIVITY LIMITATIONS: carrying, lifting, standing, squatting, stairs, transfers, dressing, hygiene/grooming, locomotion level, and caring for others  PARTICIPATION LIMITATIONS: meal prep, cleaning, laundry, driving, shopping, community activity, and church  PERSONAL FACTORS: Age, Time since onset of injury/illness/exacerbation, and 1 comorbidity: breast cancer  are also affecting patient's functional outcome.   REHAB POTENTIAL: Good  CLINICAL DECISION MAKING: Evolving/moderate complexity  EVALUATION COMPLEXITY: Moderate   GOALS: Goals reviewed with patient? Yes  SHORT TERM GOALS: Target date: 03/20/2024   Be independent in initial HEP Baseline: 03/04/24 - Pt is independent with initial HEP Goal status: MET   2.  Perform sit to stand transition without UE support with neutral hips due to improved functional strength  Baseline: 03/04/24 - increased to 7 reps with 2/7 no UE support; 03/13/24 - with controlled motion pt able to perform correctly Goal status: MET  3.  Improve 6 min walk test to > or = to 750 feet to improve community distance  Baseline: 552 feet; 03/04/24 - 808 ft with walker Goal status: MET  4.  Report > or = to 30% reduction in Rt hip pain with daily tasks  Baseline: 03/04/24 - 30% with pain meds Goal status: MET    LONG TERM GOALS:  Target date: 06/03/2024   Be independent in advanced HEP Baseline: independent in current HEP Goal status: ONGOING  2. Improved PSFS (patient specific functional scale) to > or = to 6/10 for getting on/off the floor to use foam roller  Baseline: 5/10 (04/08/24) Goal status: NEW  3.  Improve 6 min walk test to > or = to 1200 feet to improve community ambulation  Baseline: 1019 with cane (04/08/24); 04/23/24 - 979 ft with SPC (pt was slowed some today by busyness of clinic) Goal status: ONGOING  4.  Wean from cane for all distances due to symmetry with ambulation on level surfaces  Baseline: antalgia without cane, mild antalgia with cane; 04/23/24 - pt ambulates 75% of time without SPC at home, and very mild to no antalgic gait with SPC Goal status: ONGOING   5.  Report < or = to 3/10 RPE with 6 min walk test due to improved endurance and strength  Baseline: 6-7/10; 04/23/24 - 2-3/10 after 6 min walk test today Goal status: MET   PLAN:  PT FREQUENCY: 2x/week  PT DURATION: 8 weeks  PLANNED INTERVENTIONS: 97110-Therapeutic exercises, 97530- Therapeutic activity, [id number]- Neuromuscular re-education, 97535- Self  Care, [id number]- Manual therapy, [phone]- Canalith repositioning, [phone]- Aquatic Therapy, [phone]- Electrical stimulation (unattended), [phone] (1-2 muscles), [phone] (3+ muscles)- Dry Needling, Patient/Family education, Balance training, Stair training, Taping, Joint mobilization, Scar mobilization, Vestibular training, Cryotherapy, and Moist heat  PLAN FOR NEXT SESSION: Cont to work on functional tasks and mobility, cont high level balance tasks including stepping over obstacles with SPC as SLS is challenging and lifting Rt knee; cont sit to stand with staggered stance working towards Rt single leg sit to stand if able.   Burnard Joy, PT 05/06/2024 1:13 PM    [facility] [address], [address] [place], [place] [address] Phone # [phone] Fax [phone]

## 2024-05-08 ENCOUNTER — Ambulatory Visit

## 2024-05-08 DIAGNOSIS — R2689 Other abnormalities of gait and mobility: Secondary | ICD-10-CM

## 2024-05-08 DIAGNOSIS — M6281 Muscle weakness (generalized): Secondary | ICD-10-CM | POA: Diagnosis not present

## 2024-05-08 DIAGNOSIS — M79604 Pain in right leg: Secondary | ICD-10-CM

## 2024-05-08 DIAGNOSIS — R293 Abnormal posture: Secondary | ICD-10-CM

## 2024-05-08 NOTE — Therapy (Signed)
 OUTPATIENT PHYSICAL THERAPY LOWER EXTREMITY TREATMENT   Patient Name: Marie Day MRN: [id number] DOB:January 03, 1946, 78 y.o., female Today's Date: 05/08/2024     END OF SESSION:  PT End of Session - 05/08/24 1155     Visit Number 25    Date for Recertification  06/03/24    Authorization Type [employer]-no auth required    Progress Note Due on Visit 30    PT Start Time 1154    PT Stop Time 1239    PT Time Calculation (min) 45 min    Activity Tolerance Patient tolerated treatment well    Behavior During Therapy [facility] for tasks assessed/performed                    Past Medical History:  Diagnosis Date   Arthritis    Breast cancer (HCC)    Cancer (HCC)    Past Surgical History:  Procedure Laterality Date   BREAST BIOPSY Right 01/30/2023   US  [name] BX W LOC DEV 1ST LESION IMG BX SPEC US  GUIDE 01/30/2023 [facility]AM[facility]   BREAST RECONSTRUCTION WITH PLACEMENT OF TISSUE EXPANDER AND FLEX HD (ACELLULAR HYDRATED DERMIS) Right 06/29/2021   Procedure: RIGHT BREAST RECONSTRUCTION WITH PLACEMENT OF TISSUE EXPANDER AND FLEX HD (ACELLULAR HYDRATED DERMIS);  Surgeon: Elisabeth Craig RAMAN, MD;  Location: [facility];  Service: Plastics;  Laterality: Right;   BREAST REDUCTION WITH MASTOPEXY Left 06/30/2022   Procedure: LEFT BREAST REDUCTION WITH MASTOPEXY;  Surgeon: Lowery Estefana RAMAN, DO;  Location: [facility];  Service: Plastics;  Laterality: Left;   INTRAMEDULLARY (IM) NAIL INTERTROCHANTERIC Right 12/10/2023   Procedure: FIXATION, FRACTURE, INTERTROCHANTERIC, WITH INTRAMEDULLARY ROD;  Surgeon: Georgina Ozell LABOR, MD;  Location: [facility];  Service: Orthopedics;  Laterality: Right;   MASTECTOMY Right 06/29/2021   MASTECTOMY W/ SENTINEL NODE BIOPSY Right 06/29/2021   Procedure: RIGHT MASTECTOMY WITH SENTINEL LYMPH NODE BIOPSY;  Surgeon: Vanderbilt Ned, MD;  Location: [facility];  Service: General;  Laterality: Right;   PORT-A-CATH REMOVAL  Left 06/30/2022   Procedure: REMOVAL PORT-A-CATH;  Surgeon: Lowery Estefana RAMAN, DO;  Location: [facility];  Service: Plastics;  Laterality: Left;   REMOVAL OF TISSUE EXPANDER AND PLACEMENT OF IMPLANT Right 06/30/2022   Procedure: REMOVAL OF TISSUE EXPANDER AND PLACEMENT OF IMPLANT;  Surgeon: Lowery Estefana RAMAN, DO;  Location: [facility];  Service: Plastics;  Laterality: Right;   WISDOM TOOTH EXTRACTION     Patient Active Problem List   Diagnosis Date Noted   Osteoporosis 01/01/2024   Elevated antinuclear antibody (ANA) level 09/27/2023   Insomnia 09/18/2023   Dyslipidemia 03/24/2023   Body mass index (BMI) 24.0-24.9, adult 02/28/2023   Joint swelling 02/28/2023   Other specified abnormal immunological findings in serum 02/28/2023   Pain in limb 02/28/2023   Tinnitus 12/21/2022   Breast cancer (HCC) 12/21/2022   Joint stiffness 09/01/2022   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   Polyarthralgia 02/08/2022   S/P breast reconstruction 06/29/2021   Malignant neoplasm of upper-outer quadrant of right breast in female, estrogen receptor negative (HCC) 12/21/2020   Anemia 10/27/2020   Prediabetes 10/27/2020   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   Unspecified age-related cataract 12/14/2015    PCP: Kennyth Bart, MD  REFERRING PROVIDER: Georgina Ozell, MD  REFERRING DIAG:  Diagnosis  S72.141G (ICD-10-CM) - Displaced intertrochanteric fracture of right femur, subsequent encounter for closed fracture with delayed healing    THERAPY DIAG:  Muscle  weakness (generalized)  Other abnormalities of gait and mobility  Pain in right leg  Abnormal posture  Rationale for Evaluation and Treatment: Rehabilitation  ONSET DATE: 12/10/23- fall with femur fracture   SUBJECTIVE:   SUBJECTIVE STATEMENT: I didn't bring my cane because I haven't felt the need to use it as much. My stability/balance is probably my biggest issue  now.   From MD note 01/25/24:  Patient states that she is doing well at this point. She is ambulating with a walker. She is able to ambulate up and down stairs as well. She is not having any consistent pain in the hip. She is sparingly using hydrocodone  to control her pain when she does have it. She has not noticed any redness or drainage around her incisions.   PERTINENT HISTORY: Malignant neoplasm of upper-outer quadrant of right breast in female, estrogen receptor negative (HCC) 2023, IM rod placement  PAIN: 04/15/24 PAIN:  Are you having pain? No, not currently  PRECAUTIONS: Other: breast cancer and mastectomy   RED FLAGS: None   WEIGHT BEARING RESTRICTIONS: No WBAT per MD   FALLS:  Has patient fallen in last 6 months? Yes. Number of falls 1 on 12/10/23 with IM rod placement on Rt  LIVING ENVIRONMENT: Lives with: lives with their family Lives in: House/apartment Stairs: Yes: Internal: 4 steps; on right going up Has following equipment at home: [name] - 2 wheeled  PLOF: Independent and Leisure: none  PATIENT GOALS: improve mobility, walk without walker  NEXT MD VISIT: 03/31/24  OBJECTIVE:  Note: Objective measures were completed at Evaluation unless otherwise noted.   COGNITION: Overall cognitive status: Within functional limits for tasks assessed     SENSATION: WFL   POSTURE: rounded shoulders, forward head, and weight shift left  PALPATION: Tender over incisions in the Rt anterior and lateral thigh  LOWER EXTREMITY ROM: WFLs   LOWER EXTREMITY MMT: Rt hip 4-/5, Lt 4+/5, bil knees 4+/5   FUNCTIONAL TESTS:  02/14/24 5 times sit to stand:with UE support 19.30 seconds  6 minute walk test: 552 feet with walker with 4/10 RPE -age related norm is 1250 feet  03/04/24:  7 times sit to stand (30 sec): 2 without UE support/ then remaining with UE support 6 minute walk test: 808 ft with walker with 2-3/10 RPE   04/08/24:  5x sit to stand: 9.59 seconds  6 min walk  test: 1019 feet with cane- RPE 6-7/10 (age related norm is 1277 feet)  PSFS : getting on to floor to use foam roller: 5/10 (04/08/24)  GAIT: Distance walked: 50 Assistive device utilized: Environmental Consultant - 2 wheeled Level of assistance: Modified independence Comments: slow mobility, reduced step length                                                                                                                             TREATMENT DATE:  05/08/24: Therapeutic Exercises Seated EOB bil HS stretch x 3 reps, 30 sec  NuStep level 8 x 10 mins; UE 8/LE 8, 580 steps and PT present to monitor pt and encourage steady pace throughout Leg Press: seat 6 Bil 90# 2 x 20, Rt LE 40# 1 x 20, 45# 1 x 20, 55# 2 x 20; short rest breaks between sets Therapeutic Activities Sit to stand with staggered stance, Rt leg closer with 10# kettlebell x 20 , then with parallel stance: 10# x 20 Neuro Muscular Re Ed Alternated fingertip touch to cone on mat table in partial T stance with Rt SLS and Lt TTWB as pt unable to independently SLS as of yet x 5 each side with SBA for safety In // bars: Rt UE Farmer's hold 10# KB with alternating toe taps on 6 step x20 each  05/06/24: Therapeutic Exercises NuStep level 8 x 10 mins; UE 8/LE 8, 640 steps and PT present to monitor pt and encourage steady pace throughout Leg Press: seat 6 Bil 90# 2 x 20, Rt LE 40# 2 x 20, 55# x 20 Therapeutic Activities Staggered stance - sit to stand 10# kettlebell x10 each  Parallel stance: 10# x10 Neuromuscular  Re Ed In // bars: Heel walking x2 laps at barre, toe walking at barre with 1 UE support as needed Farmer's carry: 10# lap around building- 1 lap for each hand Farmer's hold 10# KB with alternating step taps on 6 step x20 each   05/01/24: Therapeutic Exercises NuStep level 8 x 10 mins; UE 8/LE 8, 640 steps and PT present to monitor pt and encourage steady pace throughout Leg Press: Bil 90# 2 x 20, Rt LE 45# 2 x 20, 55# x 20 Therapeutic  Activities Staggered stance - sit to stand 10# kettlebell 2x10 each  Neuromuscular  Re Ed In // bars: Heel-toe walking front x 3 laps encouraging no UE support, and then retro x 4 laps, required a minimum of fingertip support with 1 hand only Farmer's carry: 10# lap around building- 1 lap for each hand    04/29/24: Therapeutic Exercises NuStep level 8 x 11 mins; UE 8/LE 8, 648 steps and PTA present to monitor pt and encourage steady pace throughout Leg Press: Bil 90# 2 x 20, Rt LE 45# 2 x 20, Lt LE 50# x 10, 55# x 10, x 20 Therapeutic Activities Practiced getting up off floor from mat. Pt able to do without using mat table and independently Staggered stance (Rt closer) for sit to stand 2 x 5 Up/down stairs x 4 reps starting with +1 UE support and then was able to perform with no UE support by end of reps.  Neuromuscular  Re Ed In // bars: Heel-toe walking front x 3 laps encouraging no UE support, and then retro x 4 laps, required a minimum of fingertip support with 1 hand only     PATIENT EDUCATION:  Education details: Access Code: [email or web address] Person educated: Patient Education method: Explanation, Demonstration, and Handouts Education comprehension: verbalized understanding and returned demonstration  HOME EXERCISE PROGRAM: Access Code: [email or web address] (use new access code), [id number] Access Code: [email or web address] URL: [email or web address] Date: 02/26/2024 Prepared by: Berwyn Knights  Exercises - Standing Hip Flexion with Resistance (Mirrored)  - 1 x daily - 7 x weekly - 1 sets - 10 reps - 3 hold - Standing Hip Extension with Resistance  - 1 x daily - 7 x weekly - 1 sets - 10 reps - 3 hold - Standing Hip Abduction with Theraband Resistance  - 1 x daily - 7 x weekly - 1 sets -  10 reps - 3 hold - Supine Bridge  - 1 x daily - 7 x weekly - 2 sets - 10 reps - 5 hold - Seated Long Arc Quad with Ankle Weight  - 1 x daily - 7 x weekly - 2 sets - 10 reps - 5 hold - Seated March  - 2  x daily - 7 x weekly - 3 sets - 10 reps - Sit to Stand Without Arm Support  - 2 x daily - 7 x weekly - 2 sets - 5-10 reps - Standing Tandem Balance with Counter Support  - 1 x daily - 7 x weekly - 5 reps - 30-60 hold - Standing Single Leg Stance with Counter Support  - 1 x daily - 7 x weekly - 1 sets - 5 reps - 30-60 hold - Standing Heel Raise with Support  - 1 x daily - 7 x weekly - 3 sets - 10 reps - 3 hold - Runner's Climb  - 2 x daily - 7 x weekly - 2-3 sets - 10 reps - 3-5 hold - Seated Hamstring Stretch  - 1 x daily - 7 x weekly - 1 sets - 3 reps - 20 hold - Standing Hip Flexor Stretch  - 2 x daily - 7 x weekly - 1 sets - 3 reps - 20 hold  ASSESSMENT: CLINICAL IMPRESSION: Pt reports relying on cane less and did not bring it into therapy today. Continued with Rt LE strength but also incorporating more stability exercises with Rt SLS and weights activities. PT was challenged by these activities and will cont to benefit from continued physical therapy for appropriate progression of exercises as she improves in her recovery.   OBJECTIVE IMPAIRMENTS: Abnormal gait, decreased activity tolerance, decreased balance, decreased mobility, difficulty walking, decreased ROM, decreased strength, hypomobility, increased fascial restrictions, increased muscle spasms, impaired flexibility, and pain.   ACTIVITY LIMITATIONS: carrying, lifting, standing, squatting, stairs, transfers, dressing, hygiene/grooming, locomotion level, and caring for others  PARTICIPATION LIMITATIONS: meal prep, cleaning, laundry, driving, shopping, community activity, and church  PERSONAL FACTORS: Age, Time since onset of injury/illness/exacerbation, and 1 comorbidity: breast cancer  are also affecting patient's functional outcome.   REHAB POTENTIAL: Good  CLINICAL DECISION MAKING: Evolving/moderate complexity  EVALUATION COMPLEXITY: Moderate   GOALS: Goals reviewed with patient? Yes  SHORT TERM GOALS: Target date:  03/20/2024   Be independent in initial HEP Baseline: 03/04/24 - Pt is independent with initial HEP Goal status: MET   2.  Perform sit to stand transition without UE support with neutral hips due to improved functional strength  Baseline: 03/04/24 - increased to 7 reps with 2/7 no UE support; 03/13/24 - with controlled motion pt able to perform correctly Goal status: MET  3.  Improve 6 min walk test to > or = to 750 feet to improve community distance  Baseline: 552 feet; 03/04/24 - 808 ft with walker Goal status: MET  4.  Report > or = to 30% reduction in Rt hip pain with daily tasks  Baseline: 03/04/24 - 30% with pain meds Goal status: MET    LONG TERM GOALS: Target date: 06/03/2024   Be independent in advanced HEP Baseline: independent in current HEP Goal status: ONGOING  2. Improved PSFS (patient specific functional scale) to > or = to 6/10 for getting on/off the floor to use foam roller  Baseline: 5/10 (04/08/24) Goal status: NEW  3.  Improve 6 min walk test to > or = to 1200 feet to  improve community ambulation  Baseline: 1019 with cane (04/08/24); 04/23/24 - 979 ft with SPC (pt was slowed some today by busyness of clinic) Goal status: ONGOING  4.  Wean from cane for all distances due to symmetry with ambulation on level surfaces  Baseline: antalgia without cane, mild antalgia with cane; 04/23/24 - pt ambulates 75% of time without SPC at home, and very mild to no antalgic gait with SPC Goal status: ONGOING   5.  Report < or = to 3/10 RPE with 6 min walk test due to improved endurance and strength  Baseline: 6-7/10; 04/23/24 - 2-3/10 after 6 min walk test today Goal status: MET   PLAN:  PT FREQUENCY: 2x/week  PT DURATION: 8 weeks  PLANNED INTERVENTIONS: 97110-Therapeutic exercises, 97530- Therapeutic activity, 97112- Neuromuscular re-education, 97535- Self Care, 02859- Manual therapy, [phone]- Canalith repositioning, [id number]- Aquatic Therapy, [id number]- Electrical  stimulation (unattended), 20560 (1-2 muscles), 20561 (3+ muscles)- Dry Needling, Patient/Family education, Balance training, Stair training, Taping, Joint mobilization, Scar mobilization, Vestibular training, Cryotherapy, and Moist heat  PLAN FOR NEXT SESSION: Cont to work on functional tasks and mobility, cont high level balance tasks including stepping over obstacles as SLS is challenging and lifting Rt knee; cont sit to stand with staggered stance working towards Rt single leg sit to stand if able and other Rt SLS activities.   Berwyn Knights, PTA 05/08/2024 1:13 PM    [facility] [address], [address] [place], [place] [address] Phone # [phone] Fax [phone]

## 2024-05-13 ENCOUNTER — Ambulatory Visit

## 2024-05-13 DIAGNOSIS — R2689 Other abnormalities of gait and mobility: Secondary | ICD-10-CM

## 2024-05-13 DIAGNOSIS — M6281 Muscle weakness (generalized): Secondary | ICD-10-CM

## 2024-05-13 DIAGNOSIS — R293 Abnormal posture: Secondary | ICD-10-CM

## 2024-05-13 DIAGNOSIS — M79604 Pain in right leg: Secondary | ICD-10-CM

## 2024-05-13 NOTE — Therapy (Signed)
 " OUTPATIENT PHYSICAL THERAPY LOWER EXTREMITY TREATMENT   Patient Name: Marie Day MRN: [id number] DOB:1946/05/15, 78 y.o., female Today's Date: 05/13/2024     END OF SESSION:  PT End of Session - 05/13/24 1213     Visit Number 26    Date for Recertification  06/03/24    Authorization Type [employer]-no auth required    Progress Note Due on Visit 30    PT Start Time 1203    PT Stop Time 1251    PT Time Calculation (min) 48 min    Activity Tolerance Patient tolerated treatment well    Behavior During Therapy [facility] for tasks assessed/performed                    Past Medical History:  Diagnosis Date   Arthritis    Breast cancer (HCC)    Cancer (HCC)    Past Surgical History:  Procedure Laterality Date   BREAST BIOPSY Right 01/30/2023   US  [name] BX W LOC DEV 1ST LESION IMG BX SPEC US  GUIDE 01/30/2023 [facility]   BREAST RECONSTRUCTION WITH PLACEMENT OF TISSUE EXPANDER AND FLEX HD (ACELLULAR HYDRATED DERMIS) Right 06/29/2021   Procedure: RIGHT BREAST RECONSTRUCTION WITH PLACEMENT OF TISSUE EXPANDER AND FLEX HD (ACELLULAR HYDRATED DERMIS);  Surgeon: Elisabeth Craig RAMAN, MD;  Location: [facility];  Service: Plastics;  Laterality: Right;   BREAST REDUCTION WITH MASTOPEXY Left 06/30/2022   Procedure: LEFT BREAST REDUCTION WITH MASTOPEXY;  Surgeon: Lowery Estefana RAMAN, DO;  Location: [facility];  Service: Plastics;  Laterality: Left;   INTRAMEDULLARY (IM) NAIL INTERTROCHANTERIC Right 12/10/2023   Procedure: FIXATION, FRACTURE, INTERTROCHANTERIC, WITH INTRAMEDULLARY ROD;  Surgeon: Georgina Ozell LABOR, MD;  Location: [facility];  Service: Orthopedics;  Laterality: Right;   MASTECTOMY Right 06/29/2021   MASTECTOMY W/ SENTINEL NODE BIOPSY Right 06/29/2021   Procedure: RIGHT MASTECTOMY WITH SENTINEL LYMPH NODE BIOPSY;  Surgeon: Vanderbilt Ned, MD;  Location: [facility];  Service: General;  Laterality: Right;   PORT-A-CATH REMOVAL  Left 06/30/2022   Procedure: REMOVAL PORT-A-CATH;  Surgeon: Lowery Estefana RAMAN, DO;  Location: [facility];  Service: Plastics;  Laterality: Left;   REMOVAL OF TISSUE EXPANDER AND PLACEMENT OF IMPLANT Right 06/30/2022   Procedure: REMOVAL OF TISSUE EXPANDER AND PLACEMENT OF IMPLANT;  Surgeon: Lowery Estefana RAMAN, DO;  Location: [facility];  Service: Plastics;  Laterality: Right;   WISDOM TOOTH EXTRACTION     Patient Active Problem List   Diagnosis Date Noted   Osteoporosis 01/01/2024   Elevated antinuclear antibody (ANA) level 09/27/2023   Insomnia 09/18/2023   Dyslipidemia 03/24/2023   Body mass index (BMI) 24.0-24.9, adult 02/28/2023   Joint swelling 02/28/2023   Other specified abnormal immunological findings in serum 02/28/2023   Pain in limb 02/28/2023   Tinnitus 12/21/2022   Breast cancer (HCC) 12/21/2022   Joint stiffness 09/01/2022   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   Polyarthralgia 02/08/2022   S/P breast reconstruction 06/29/2021   Malignant neoplasm of upper-outer quadrant of right breast in female, estrogen receptor negative (HCC) 12/21/2020   Anemia 10/27/2020   Prediabetes 10/27/2020   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 I find myself carrying my cane at times now. The gabapentin  has been fabulous at helping  me sleep all night.   From MD note 01/25/24:  Patient states that she is doing well at this point. She is ambulating with a walker. She is able to ambulate up and down stairs as well. She is not having any consistent pain in the hip. She is sparingly using hydrocodone  to control her pain when she does have it. She has not noticed any redness or drainage around her incisions.   PERTINENT HISTORY: Malignant neoplasm of upper-outer quadrant of right breast in female, estrogen receptor negative (HCC) 2023, IM rod placement  PAIN: 04/15/24 PAIN:  Are you having pain? No, not currently  PRECAUTIONS: Other: breast cancer and mastectomy   RED FLAGS: None   WEIGHT BEARING RESTRICTIONS: No WBAT per MD   FALLS:  Has patient fallen in last 6 months? Yes. Number of falls 1 on 12/10/23 with IM rod placement on Rt  LIVING ENVIRONMENT: Lives with: lives with their family Lives in: House/apartment Stairs: Yes: Internal: 4 steps; on right going up Has following equipment at home: [name] - 2 wheeled  PLOF: Independent and Leisure: none  PATIENT GOALS: improve mobility, walk without walker  NEXT MD VISIT: 03/31/24  OBJECTIVE:  Note: Objective measures were completed at Evaluation unless otherwise noted.   COGNITION: Overall cognitive status: Within functional limits for tasks assessed     SENSATION: WFL   POSTURE: rounded shoulders, forward head, and weight shift left  PALPATION: Tender over incisions in the Rt anterior and lateral thigh  LOWER EXTREMITY ROM: WFLs   LOWER EXTREMITY MMT: Rt hip 4-/5, Lt 4+/5, bil knees 4+/5   FUNCTIONAL TESTS:  02/14/24 5 times sit to stand:with UE support 19.30 seconds  6 minute walk test: 552 feet with walker with 4/10 RPE -age related norm is 1250 feet  03/04/24:  7 times sit to stand (30 sec): 2 without UE support/ then remaining with UE support 6 minute walk test: 808 ft with walker with 2-3/10 RPE   04/08/24:  5x sit to stand: 9.59  seconds  6 min walk test: 1019 feet with cane- RPE 6-7/10 (age related norm is 1277 feet)  PSFS : getting on to floor to use foam roller: 5/10 (04/08/24)  GAIT: Distance walked: 50 Assistive device utilized: Environmental Consultant - 2 wheeled Level of assistance: Modified independence Comments: slow mobility, reduced step length                                                                                                                             TREATMENT DATE:  05/13/24: Therapeutic Exercises Seated EOB bil HS stretch x 3  reps, 30 sec NuStep level 8 x 10 mins; UE 8/LE 8, 638 steps and PTA present to monitor pt, pt with slight SOB after and RPE 3-4/10 Leg Press: seat 6 Bil 90# x 20, then 100# x 20, Rt LE 45# 2 x 20, 55# 2 x 20; short rest breaks between sets Neuro Muscular Re Ed Alternated fingertip touch to cone on mat table in partial T stance with Rt SLS and Lt TTWB as pt unable to independently SLS as of yet x 5 each side with SBA for safety In // bars: Rt UE Farmer's hold 10# KB with alternating toe taps on 6 step x20 each, VC's to keep core engaged for stability Therapeutic Activities  Bil sidestepping in clinic with yellow loop above knees x 15 ft, 3 laps each direction Sit to stand with yellow loop above knees and 5# kettle bell holds x 20 with demo and VC's to keep knees hip width  05/08/24: Therapeutic Exercises Seated EOB bil HS stretch x 3 reps, 30 sec NuStep level 8 x 10 mins; UE 8/LE 8, 580 steps and PT present to monitor pt and encourage steady pace throughout Leg Press: seat 6 Bil 90# 2 x 20, Rt LE 40# 1 x 20, 45# 1 x 20, 55# 2 x 20; short rest breaks between sets Therapeutic Activities Sit to stand with staggered stance, Rt leg closer with 10# kettlebell x 20 , then with parallel stance: 10# x 20 Neuro Muscular Re Ed Alternated fingertip touch to cone on mat table in partial T stance with Rt SLS and Lt TTWB as pt unable to independently SLS as of yet x 5 each side with SBA for  safety In // bars: Rt UE Farmer's hold 10# KB with alternating toe taps on 6 step x20 each  05/06/24: Therapeutic Exercises NuStep level 8 x 10 mins; UE 8/LE 8, 640 steps and PT present to monitor pt and encourage steady pace throughout Leg Press: seat 6 Bil 90# 2 x 20, Rt LE 40# 2 x 20, 55# x 20 Therapeutic Activities Staggered stance - sit to stand 10# kettlebell x10 each  Parallel stance: 10# x10 Neuromuscular  Re Ed In // bars: Heel walking x2 laps at barre, toe walking at barre with 1 UE support as needed Farmer's carry: 10# lap around building- 1 lap for each hand Farmer's hold 10# KB with alternating step taps on 6 step x20 each      PATIENT EDUCATION:  Education details: Access Code: [email or web address] Person educated: Patient Education method: Explanation, Demonstration, and Handouts Education comprehension: verbalized understanding and returned demonstration  HOME EXERCISE PROGRAM: Access Code: [email or web address] (use new access code), [id number] Access Code: [email or web address] URL: [email or web address] Date: 02/26/2024 Prepared by: Berwyn Knights  Exercises - Standing Hip Flexion with Resistance (Mirrored)  - 1 x daily - 7 x weekly - 1 sets - 10 reps - 3 hold - Standing Hip Extension with Resistance  - 1 x daily - 7 x weekly - 1 sets - 10 reps - 3 hold - Standing Hip Abduction with Theraband Resistance  - 1 x daily - 7 x weekly - 1 sets - 10 reps - 3 hold - Supine Bridge  - 1 x daily - 7 x weekly - 2 sets - 10 reps - 5 hold - Seated Long Arc Quad with Ankle Weight  - 1 x daily - 7 x weekly - 2 sets - 10 reps - 5 hold - Seated March  - 2 x daily -  7 x weekly - 3 sets - 10 reps - Sit to Stand Without Arm Support  - 2 x daily - 7 x weekly - 2 sets - 5-10 reps - Standing Tandem Balance with Counter Support  - 1 x daily - 7 x weekly - 5 reps - 30-60 hold - Standing Single Leg Stance with Counter Support  - 1 x daily - 7 x weekly - 1 sets - 5 reps - 30-60 hold - Standing Heel  Raise with Support  - 1 x daily - 7 x weekly - 3 sets - 10 reps - 3 hold - Runner's Climb  - 2 x daily - 7 x weekly - 2-3 sets - 10 reps - 3-5 hold - Seated Hamstring Stretch  - 1 x daily - 7 x weekly - 1 sets - 3 reps - 20 hold - Standing Hip Flexor Stretch  - 2 x daily - 7 x weekly - 1 sets - 3 reps - 20 hold  ASSESSMENT: CLINICAL IMPRESSION: Pt conts to tolerate progression of weights and reps well showing good improvement with each session. She reports finding herself carrying her cane more often as she needs it less during community ambulation.   OBJECTIVE IMPAIRMENTS: Abnormal gait, decreased activity tolerance, decreased balance, decreased mobility, difficulty walking, decreased ROM, decreased strength, hypomobility, increased fascial restrictions, increased muscle spasms, impaired flexibility, and pain.   ACTIVITY LIMITATIONS: carrying, lifting, standing, squatting, stairs, transfers, dressing, hygiene/grooming, locomotion level, and caring for others  PARTICIPATION LIMITATIONS: meal prep, cleaning, laundry, driving, shopping, community activity, and church  PERSONAL FACTORS: Age, Time since onset of injury/illness/exacerbation, and 1 comorbidity: breast cancer  are also affecting patient's functional outcome.   REHAB POTENTIAL: Good  CLINICAL DECISION MAKING: Evolving/moderate complexity  EVALUATION COMPLEXITY: Moderate   GOALS: Goals reviewed with patient? Yes  SHORT TERM GOALS: Target date: 03/20/2024   Be independent in initial HEP Baseline: 03/04/24 - Pt is independent with initial HEP Goal status: MET   2.  Perform sit to stand transition without UE support with neutral hips due to improved functional strength  Baseline: 03/04/24 - increased to 7 reps with 2/7 no UE support; 03/13/24 - with controlled motion pt able to perform correctly Goal status: MET  3.  Improve 6 min walk test to > or = to 750 feet to improve community distance  Baseline: 552 feet; 03/04/24  - 808 ft with walker Goal status: MET  4.  Report > or = to 30% reduction in Rt hip pain with daily tasks  Baseline: 03/04/24 - 30% with pain meds Goal status: MET    LONG TERM GOALS: Target date: 06/03/2024   Be independent in advanced HEP Baseline: independent in current HEP Goal status: ONGOING  2. Improved PSFS (patient specific functional scale) to > or = to 6/10 for getting on/off the floor to use foam roller  Baseline: 5/10 (04/08/24) Goal status: NEW  3.  Improve 6 min walk test to > or = to 1200 feet to improve community ambulation  Baseline: 1019 with cane (04/08/24); 04/23/24 - 979 ft with SPC (pt was slowed some today by busyness of clinic) Goal status: ONGOING  4.  Wean from cane for all distances due to symmetry with ambulation on level surfaces  Baseline: antalgia without cane, mild antalgia with cane; 04/23/24 - pt ambulates 75% of time without SPC at home, and very mild to no antalgic gait with SPC Goal status: ONGOING   5.  Report < or = to  3/10 RPE with 6 min walk test due to improved endurance and strength  Baseline: 6-7/10; 04/23/24 - 2-3/10 after 6 min walk test today Goal status: MET   PLAN:  PT FREQUENCY: 2x/week  PT DURATION: 8 weeks  PLANNED INTERVENTIONS: 97110-Therapeutic exercises, 97530- Therapeutic activity, 97112- Neuromuscular re-education, 97535- Self Care, 02859- Manual therapy, [phone]- Canalith repositioning, [id number]- Aquatic Therapy, [id number]- Electrical stimulation (unattended), 20560 (1-2 muscles), 20561 (3+ muscles)- Dry Needling, Patient/Family education, Balance training, Stair training, Taping, Joint mobilization, Scar mobilization, Vestibular training, Cryotherapy, and Moist heat  PLAN FOR NEXT SESSION: Cont to work on functional tasks and mobility, cont high level balance tasks including stepping over obstacles as SLS is challenging and lifting Rt knee; cont sit to stand with staggered stance working towards Rt single leg sit to stand if  able and other Rt SLS activities.   Berwyn Knights, PTA 05/13/2024 12:57 PM    [facility] [address], [address] [place], [place] [address] Phone # [phone] Fax [phone]  "

## 2024-05-20 ENCOUNTER — Ambulatory Visit: Admitting: Rehabilitation

## 2024-05-20 DIAGNOSIS — M6281 Muscle weakness (generalized): Secondary | ICD-10-CM

## 2024-05-20 DIAGNOSIS — R2689 Other abnormalities of gait and mobility: Secondary | ICD-10-CM

## 2024-05-20 DIAGNOSIS — M79604 Pain in right leg: Secondary | ICD-10-CM

## 2024-05-20 NOTE — Therapy (Signed)
 " OUTPATIENT PHYSICAL THERAPY LOWER EXTREMITY TREATMENT   Patient Name: Marie Day MRN: [id number] DOB:1946-03-20, 78 y.o., female Today's Date: 05/20/2024     END OF SESSION:  PT End of Session - 05/20/24 1952     Visit Number 27    Date for Recertification  06/03/24    Progress Note Due on Visit 30    PT Start Time 1202    PT Stop Time 1251    PT Time Calculation (min) 49 min    Activity Tolerance Patient tolerated treatment well    Behavior During Therapy [facility] for tasks assessed/performed                     Past Medical History:  Diagnosis Date   Arthritis    Breast cancer (HCC)    Cancer (HCC)    Past Surgical History:  Procedure Laterality Date   BREAST BIOPSY Right 01/30/2023   US  [name] BX W LOC DEV 1ST LESION IMG BX SPEC US  GUIDE 01/30/2023 [facility]   BREAST RECONSTRUCTION WITH PLACEMENT OF TISSUE EXPANDER AND FLEX HD (ACELLULAR HYDRATED DERMIS) Right 06/29/2021   Procedure: RIGHT BREAST RECONSTRUCTION WITH PLACEMENT OF TISSUE EXPANDER AND FLEX HD (ACELLULAR HYDRATED DERMIS);  Surgeon: Elisabeth Craig RAMAN, MD;  Location: [facility];  Service: Plastics;  Laterality: Right;   BREAST REDUCTION WITH MASTOPEXY Left 06/30/2022   Procedure: LEFT BREAST REDUCTION WITH MASTOPEXY;  Surgeon: Lowery Estefana RAMAN, DO;  Location: [facility];  Service: Plastics;  Laterality: Left;   INTRAMEDULLARY (IM) NAIL INTERTROCHANTERIC Right 12/10/2023   Procedure: FIXATION, FRACTURE, INTERTROCHANTERIC, WITH INTRAMEDULLARY ROD;  Surgeon: Georgina Ozell LABOR, MD;  Location: [facility];  Service: Orthopedics;  Laterality: Right;   MASTECTOMY Right 06/29/2021   MASTECTOMY W/ SENTINEL NODE BIOPSY Right 06/29/2021   Procedure: RIGHT MASTECTOMY WITH SENTINEL LYMPH NODE BIOPSY;  Surgeon: Vanderbilt Ned, MD;  Location: [facility];  Service: General;  Laterality: Right;   PORT-A-CATH REMOVAL Left 06/30/2022   Procedure: REMOVAL PORT-A-CATH;   Surgeon: Lowery Estefana RAMAN, DO;  Location: [facility];  Service: Plastics;  Laterality: Left;   REMOVAL OF TISSUE EXPANDER AND PLACEMENT OF IMPLANT Right 06/30/2022   Procedure: REMOVAL OF TISSUE EXPANDER AND PLACEMENT OF IMPLANT;  Surgeon: Lowery Estefana RAMAN, DO;  Location: [facility];  Service: Plastics;  Laterality: Right;   WISDOM TOOTH EXTRACTION     Patient Active Problem List   Diagnosis Date Noted   Osteoporosis 01/01/2024   Elevated antinuclear antibody (ANA) level 09/27/2023   Insomnia 09/18/2023   Dyslipidemia 03/24/2023   Body mass index (BMI) 24.0-24.9, adult 02/28/2023   Joint swelling 02/28/2023   Other specified abnormal immunological findings in serum 02/28/2023   Pain in limb 02/28/2023   Tinnitus 12/21/2022   Breast cancer (HCC) 12/21/2022   Joint stiffness 09/01/2022   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   Polyarthralgia 02/08/2022   S/P breast reconstruction 06/29/2021   Malignant neoplasm of upper-outer quadrant of right breast in female, estrogen receptor negative (HCC) 12/21/2020   Anemia 10/27/2020   Prediabetes 10/27/2020   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    From MD note 01/25/24:  Patient states that she is doing well at this point. She is ambulating with a walker. She is able to ambulate up and  down stairs as well. She is not having any consistent pain in the hip. She is sparingly using hydrocodone  to control her pain when she does have it. She has not noticed any redness or drainage around her incisions.   PERTINENT HISTORY: Malignant neoplasm of upper-outer quadrant of right breast in female, estrogen receptor negative (HCC) 2023, IM rod placement  PAIN: 04/15/24 PAIN:  Are you having pain? No, not currently  PRECAUTIONS: Other: breast cancer and mastectomy   RED FLAGS: None   WEIGHT BEARING RESTRICTIONS: No WBAT per MD   FALLS:  Has patient fallen in last 6 months? Yes. Number of falls 1 on 12/10/23 with IM rod placement on Rt  LIVING ENVIRONMENT: Lives with: lives with their family Lives in: House/apartment Stairs: Yes: Internal: 4 steps; on right going up Has following equipment at home: [name] - 2 wheeled  PLOF: Independent and Leisure: none  PATIENT GOALS: improve mobility, walk without walker  NEXT MD VISIT: 03/31/24  OBJECTIVE:  Note: Objective measures were completed at Evaluation unless otherwise noted.   COGNITION: Overall cognitive status: Within functional limits for tasks assessed     SENSATION: WFL   POSTURE: rounded shoulders, forward head, and weight shift left  PALPATION: Tender over incisions in the Rt anterior and lateral thigh  LOWER EXTREMITY ROM: WFLs   LOWER EXTREMITY MMT: Rt hip 4-/5, Lt 4+/5, bil knees 4+/5   FUNCTIONAL TESTS:  02/14/24 5 times sit to stand:with UE support 19.30 seconds  6 minute walk test: 552 feet with walker with 4/10 RPE -age related norm is 1250 feet  03/04/24:  7 times sit to stand (30 sec): 2 without UE support/ then remaining with UE support 6 minute walk test: 808 ft with walker with 2-3/10 RPE   04/08/24:  5x sit to stand: 9.59 seconds  6 min walk test: 1019 feet with cane- RPE 6-7/10 (age related norm is 1277 feet)  PSFS : getting on to floor to use foam roller: 5/10  (04/08/24)  GAIT: Distance walked: 50 Assistive device utilized: Environmental Consultant - 2 wheeled Level of assistance: Modified independence Comments: slow mobility, reduced step length                                                                                                                             TREATMENT DATE:  05/20/24 Therapeutic Exercises NuStep level 8 x 10 mins; UE 8/LE 8, 600 steps and PT present to monitor pt Seated EOB bil HS stretch x 3 reps, 30 sec Leg Press: seat 6 Bil 100#  2x 20, Rt LE 45# 2 x 20, Lt: 55# 2 x 20; short rest breaks between sets Neuro Muscular Re Ed In parallel bars:  star tap to 4 green cones with vcs to not step hard enough to collapse the cone x 6 bil legs In // bars: Rt UE Farmer's hold 10# KB with alternating toe taps on 8 step x20 each, VC's to keep core engaged for stability  Therapeutic Activities  Bil sidestepping in clinic with yellow loop at ankles x 15 ft, 3 laps each direction Sit to stand with yellow loop above knees and 5# kettle bell holds 4x5  with demo and VC's to keep knees hip width  05/13/24: Therapeutic Exercises Seated EOB bil HS stretch x 3 reps, 30 sec NuStep level 8 x 10 mins; UE 8/LE 8, 638 steps and PTA present to monitor pt, pt with slight SOB after and RPE 3-4/10 Leg Press: seat 6 Bil 90# x 20, then 100# x 20, Rt LE 45# 2 x 20, 55# 2 x 20; short rest breaks between sets Neuro Muscular Re Ed Alternated fingertip touch to cone on mat table in partial T stance with Rt SLS and Lt TTWB as pt unable to independently SLS as of yet x 5 each side with SBA for safety In // bars: Rt UE Farmer's hold 10# KB with alternating toe taps on 6 step x20 each, VC's to keep core engaged for stability Therapeutic Activities  Bil sidestepping in clinic with yellow loop above knees x 15 ft, 3 laps each direction Sit to stand with yellow loop above knees and 5# kettle bell holds x 20 with demo and VC's to keep knees hip  width  05/08/24: Therapeutic Exercises Seated EOB bil HS stretch x 3 reps, 30 sec NuStep level 8 x 10 mins; UE 8/LE 8, 580 steps and PT present to monitor pt and encourage steady pace throughout Leg Press: seat 6 Bil 90# 2 x 20, Rt LE 40# 1 x 20, 45# 1 x 20, 55# 2 x 20; short rest breaks between sets Therapeutic Activities Sit to stand with staggered stance, Rt leg closer with 10# kettlebell x 20 , then with parallel stance: 10# x 20 Neuro Muscular Re Ed Alternated fingertip touch to cone on mat table in partial T stance with Rt SLS and Lt TTWB as pt unable to independently SLS as of yet x 5 each side with SBA for safety In // bars: Rt UE Farmer's hold 10# KB with alternating toe taps on 6 step x20 each  PATIENT EDUCATION:  Education details: Access Code: [email or web address] Person educated: Patient Education method: Explanation, Demonstration, and Handouts Education comprehension: verbalized understanding and returned demonstration  HOME EXERCISE PROGRAM: Access Code: [email or web address] (use new access code), [id number] Access Code: [email or web address] URL: [email or web address] Date: 02/26/2024 Prepared by: Berwyn Knights  Exercises - Standing Hip Flexion with Resistance (Mirrored)  - 1 x daily - 7 x weekly - 1 sets - 10 reps - 3 hold - Standing Hip Extension with Resistance  - 1 x daily - 7 x weekly - 1 sets - 10 reps - 3 hold - Standing Hip Abduction with Theraband Resistance  - 1 x daily - 7 x weekly - 1 sets - 10 reps - 3 hold - Supine Bridge  - 1 x daily - 7 x weekly - 2 sets - 10 reps - 5 hold - Seated Long Arc Quad with Ankle Weight  - 1 x daily - 7 x weekly - 2 sets -  10 reps - 5 hold - Seated March  - 2 x daily - 7 x weekly - 3 sets - 10 reps - Sit to Stand Without Arm Support  - 2 x daily - 7 x weekly - 2 sets - 5-10 reps - Standing Tandem Balance with Counter Support  - 1 x daily - 7 x weekly - 5 reps - 30-60 hold - Standing Single Leg Stance with Counter Support  - 1 x daily - 7 x  weekly - 1 sets - 5 reps - 30-60 hold - Standing Heel Raise with Support  - 1 x daily - 7 x weekly - 3 sets - 10 reps - 3 hold - Runner's Climb  - 2 x daily - 7 x weekly - 2-3 sets - 10 reps - 3-5 hold - Seated Hamstring Stretch  - 1 x daily - 7 x weekly - 1 sets - 3 reps - 20 hold - Standing Hip Flexor Stretch  - 2 x daily - 7 x weekly - 1 sets - 3 reps - 20 hold  ASSESSMENT: CLINICAL IMPRESSION: Continued POC with addition of more SL stance work which pt reports is the hardest to do.  OBJECTIVE IMPAIRMENTS: Abnormal gait, decreased activity tolerance, decreased balance, decreased mobility, difficulty walking, decreased ROM, decreased strength, hypomobility, increased fascial restrictions, increased muscle spasms, impaired flexibility, and pain.   ACTIVITY LIMITATIONS: carrying, lifting, standing, squatting, stairs, transfers, dressing, hygiene/grooming, locomotion level, and caring for others  PARTICIPATION LIMITATIONS: meal prep, cleaning, laundry, driving, shopping, community activity, and church  PERSONAL FACTORS: Age, Time since onset of injury/illness/exacerbation, and 1 comorbidity: breast cancer  are also affecting patient's functional outcome.   REHAB POTENTIAL: Good  CLINICAL DECISION MAKING: Evolving/moderate complexity  EVALUATION COMPLEXITY: Moderate   GOALS: Goals reviewed with patient? Yes  SHORT TERM GOALS: Target date: 03/20/2024   Be independent in initial HEP Baseline: 03/04/24 - Pt is independent with initial HEP Goal status: MET   2.  Perform sit to stand transition without UE support with neutral hips due to improved functional strength  Baseline: 03/04/24 - increased to 7 reps with 2/7 no UE support; 03/13/24 - with controlled motion pt able to perform correctly Goal status: MET  3.  Improve 6 min walk test to > or = to 750 feet to improve community distance  Baseline: 552 feet; 03/04/24 - 808 ft with walker Goal status: MET  4.  Report > or = to  30% reduction in Rt hip pain with daily tasks  Baseline: 03/04/24 - 30% with pain meds Goal status: MET    LONG TERM GOALS: Target date: 06/03/2024   Be independent in advanced HEP Baseline: independent in current HEP Goal status: ONGOING  2. Improved PSFS (patient specific functional scale) to > or = to 6/10 for getting on/off the floor to use foam roller  Baseline: 5/10 (04/08/24) Goal status: NEW  3.  Improve 6 min walk test to > or = to 1200 feet to improve community ambulation  Baseline: 1019 with cane (04/08/24); 04/23/24 - 979 ft with SPC (pt was slowed some today by busyness of clinic) Goal status: ONGOING  4.  Wean from cane for all distances due to symmetry with ambulation on level surfaces  Baseline: antalgia without cane, mild antalgia with cane; 04/23/24 - pt ambulates 75% of time without SPC at home, and very mild to no antalgic gait with SPC Goal status: ONGOING   5.  Report < or = to 3/10 RPE with  6 min walk test due to improved endurance and strength  Baseline: 6-7/10; 04/23/24 - 2-3/10 after 6 min walk test today Goal status: MET   PLAN:  PT FREQUENCY: 2x/week  PT DURATION: 8 weeks  PLANNED INTERVENTIONS: 97110-Therapeutic exercises, 97530- Therapeutic activity, 97112- Neuromuscular re-education, 97535- Self Care, 02859- Manual therapy, [phone]- Canalith repositioning, [id number]- Aquatic Therapy, [phone]- Electrical stimulation (unattended), 20560 (1-2 muscles), 20561 (3+ muscles)- Dry Needling, Patient/Family education, Balance training, Stair training, Taping, Joint mobilization, Scar mobilization, Vestibular training, Cryotherapy, and Moist heat  PLAN FOR NEXT SESSION: Cont to work on functional tasks and mobility, cont high level balance tasks including stepping over obstacles as SLS is challenging and lifting Rt knee; cont sit to stand with staggered stance working towards Rt single leg sit to stand if able and other Rt SLS activities.   Saddie Raw, PT 05/20/2024,  7:54 PM   [facility] [address], [address] [place], [place] [address] Phone # [phone] Fax [phone]  "

## 2024-05-27 ENCOUNTER — Ambulatory Visit: Attending: Orthopedic Surgery

## 2024-05-27 DIAGNOSIS — M6281 Muscle weakness (generalized): Secondary | ICD-10-CM | POA: Insufficient documentation

## 2024-05-27 DIAGNOSIS — R293 Abnormal posture: Secondary | ICD-10-CM | POA: Insufficient documentation

## 2024-05-27 DIAGNOSIS — R2689 Other abnormalities of gait and mobility: Secondary | ICD-10-CM | POA: Insufficient documentation

## 2024-05-27 DIAGNOSIS — M79604 Pain in right leg: Secondary | ICD-10-CM | POA: Diagnosis present

## 2024-05-27 NOTE — Therapy (Signed)
 " OUTPATIENT PHYSICAL THERAPY LOWER EXTREMITY TREATMENT   Patient Name: Marie Day MRN: [id number] DOB:Jun 08, 1945, 79 y.o., female Today's Date: 05/27/2024     END OF SESSION:  PT End of Session - 05/27/24 1148     Visit Number 28    Date for Recertification  06/03/24    Authorization Type [employer]-no auth required    Progress Note Due on Visit 30    PT Start Time 1138    PT Stop Time 1223    PT Time Calculation (min) 45 min    Activity Tolerance Patient tolerated treatment well    Behavior During Therapy [facility] for tasks assessed/performed                      Past Medical History:  Diagnosis Date   Arthritis    Breast cancer (HCC)    Cancer (HCC)    Past Surgical History:  Procedure Laterality Date   BREAST BIOPSY Right 01/30/2023   US  [name] BX W LOC DEV 1ST LESION IMG BX SPEC US  GUIDE 01/30/2023 [facility]   BREAST RECONSTRUCTION WITH PLACEMENT OF TISSUE EXPANDER AND FLEX HD (ACELLULAR HYDRATED DERMIS) Right 06/29/2021   Procedure: RIGHT BREAST RECONSTRUCTION WITH PLACEMENT OF TISSUE EXPANDER AND FLEX HD (ACELLULAR HYDRATED DERMIS);  Surgeon: Elisabeth Craig RAMAN, MD;  Location: [facility];  Service: Plastics;  Laterality: Right;   BREAST REDUCTION WITH MASTOPEXY Left 06/30/2022   Procedure: LEFT BREAST REDUCTION WITH MASTOPEXY;  Surgeon: Lowery Estefana RAMAN, DO;  Location: [facility];  Service: Plastics;  Laterality: Left;   INTRAMEDULLARY (IM) NAIL INTERTROCHANTERIC Right 12/10/2023   Procedure: FIXATION, FRACTURE, INTERTROCHANTERIC, WITH INTRAMEDULLARY ROD;  Surgeon: Georgina Ozell LABOR, MD;  Location: [facility];  Service: Orthopedics;  Laterality: Right;   MASTECTOMY Right 06/29/2021   MASTECTOMY W/ SENTINEL NODE BIOPSY Right 06/29/2021   Procedure: RIGHT MASTECTOMY WITH SENTINEL LYMPH NODE BIOPSY;  Surgeon: Vanderbilt Ned, MD;  Location: [facility];  Service: General;  Laterality: Right;   PORT-A-CATH REMOVAL  Left 06/30/2022   Procedure: REMOVAL PORT-A-CATH;  Surgeon: Lowery Estefana RAMAN, DO;  Location: [facility];  Service: Plastics;  Laterality: Left;   REMOVAL OF TISSUE EXPANDER AND PLACEMENT OF IMPLANT Right 06/30/2022   Procedure: REMOVAL OF TISSUE EXPANDER AND PLACEMENT OF IMPLANT;  Surgeon: Lowery Estefana RAMAN, DO;  Location: [facility];  Service: Plastics;  Laterality: Right;   WISDOM TOOTH EXTRACTION     Patient Active Problem List   Diagnosis Date Noted   Osteoporosis 01/01/2024   Elevated antinuclear antibody (ANA) level 09/27/2023   Insomnia 09/18/2023   Dyslipidemia 03/24/2023   Body mass index (BMI) 24.0-24.9, adult 02/28/2023   Joint swelling 02/28/2023   Other specified abnormal immunological findings in serum 02/28/2023   Pain in limb 02/28/2023   Tinnitus 12/21/2022   Breast cancer (HCC) 12/21/2022   Joint stiffness 09/01/2022   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   Polyarthralgia 02/08/2022   S/P breast reconstruction 06/29/2021   Malignant neoplasm of upper-outer quadrant of right breast in female, estrogen receptor negative (HCC) 12/21/2020   Anemia 10/27/2020   Prediabetes 10/27/2020   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   Unspecified age-related cataract 12/14/2015    PCP: Kennyth Bart, MD  REFERRING PROVIDER: Georgina Ozell, MD  REFERRING DIAG:  Diagnosis  S72.141G (ICD-10-CM) - Displaced intertrochanteric fracture of right femur, subsequent encounter for closed fracture with delayed healing    THERAPY  DIAG:  Muscle weakness (generalized)  Other abnormalities of gait and mobility  Pain in right leg  Abnormal posture  Rationale for Evaluation and Treatment: Rehabilitation  ONSET DATE: 12/10/23- fall with femur fracture   SUBJECTIVE:   SUBJECTIVE STATEMENT: Overall doing well  From MD note 01/25/24:  Patient states that she is doing well at this point. She is ambulating with a  walker. She is able to ambulate up and down stairs as well. She is not having any consistent pain in the hip. She is sparingly using hydrocodone  to control her pain when she does have it. She has not noticed any redness or drainage around her incisions.   PERTINENT HISTORY: Malignant neoplasm of upper-outer quadrant of right breast in female, estrogen receptor negative (HCC) 2023, IM rod placement  PAIN: 05/27/24 PAIN:  Are you having pain? No, not currently  PRECAUTIONS: Other: breast cancer and mastectomy   RED FLAGS: None   WEIGHT BEARING RESTRICTIONS: No WBAT per MD   FALLS:  Has patient fallen in last 6 months? Yes. Number of falls 1 on 12/10/23 with IM rod placement on Rt  LIVING ENVIRONMENT: Lives with: lives with their family Lives in: House/apartment Stairs: Yes: Internal: 4 steps; on right going up Has following equipment at home: [name] - 2 wheeled  PLOF: Independent and Leisure: none  PATIENT GOALS: improve mobility, walk without walker  NEXT MD VISIT: 03/31/24  OBJECTIVE:  Note: Objective measures were completed at Evaluation unless otherwise noted.   COGNITION: Overall cognitive status: Within functional limits for tasks assessed     SENSATION: WFL   POSTURE: rounded shoulders, forward head, and weight shift left  PALPATION: Tender over incisions in the Rt anterior and lateral thigh  LOWER EXTREMITY ROM: WFLs   LOWER EXTREMITY MMT: Rt hip 4-/5, Lt 4+/5, bil knees 4+/5   FUNCTIONAL TESTS:  02/14/24 5 times sit to stand:with UE support 19.30 seconds  6 minute walk test: 552 feet with walker with 4/10 RPE -age related norm is 1250 feet  03/04/24:  7 times sit to stand (30 sec): 2 without UE support/ then remaining with UE support 6 minute walk test: 808 ft with walker with 2-3/10 RPE   04/08/24:  5x sit to stand: 9.59 seconds  6 min walk test: 1019 feet with cane- RPE 6-7/10 (age related norm is 1277 feet)  PSFS : getting on to floor to use foam  roller: 5/10 (04/08/24)  GAIT: Distance walked: 50 Assistive device utilized: Environmental Consultant - 2 wheeled Level of assistance: Modified independence Comments: slow mobility, reduced step length                                                                                                                             TREATMENT DATE:  05/27/24 Therapeutic Exercises NuStep level 8 x 10 mins; UE 8/LE 8  PT present to monitor pt Seated EOB bil HS stretch x 3 reps, 30 sec Leg Press: seat 6  Bil 100# 2x 20, Rt LE 45# 2 x 20, Lt: 55# 2 x 20; short rest breaks between sets  Neuro Muscular Re Ed In parallel bars:  star tap to 4 green cones with vcs to not step hard enough to collapse the cone x 6 bil legs In // bars: Rt UE Farmer's hold 10# KB with alternating toe taps on 8 step x20 each, VC's to keep core engaged for stability  Therapeutic Activities  Bil sidestepping in clinic with yellow loop at ankles x 15 ft, 3 laps each direction Lateral step up on 6 step 2x10 Sit to stand with yellow loop above knees and 10# kettle bell holds 4x5  with demo and VC's to keep knees hip width Farmer's carry around clinic with 10# kettlebell: 1 lap each hand   05/20/24 Therapeutic Exercises NuStep level 8 x 10 mins; UE 8/LE 8, 600 steps and PT present to monitor pt Seated EOB bil HS stretch x 3 reps, 30 sec Leg Press: seat 6 Bil 100# 2x 20, Rt LE 45# 2 x 20, Lt: 55# 2 x 20; short rest breaks between sets Neuro Muscular Re Ed In parallel bars:  star tap to 4 green cones with vcs to not step hard enough to collapse the cone x 6 bil legs In // bars: Rt UE Farmer's hold 10# KB with alternating toe taps on 8 step x20 each, VC's to keep core engaged for stability  Therapeutic Activities  Bil sidestepping in clinic with yellow loop at ankles x 15 ft, 3 laps each direction Sit to stand with yellow loop above knees and 5# kettle bell holds 4x5  with demo and VC's to keep knees hip width  05/13/24: Therapeutic  Exercises Seated EOB bil HS stretch x 3 reps, 30 sec NuStep level 8 x 10 mins; UE 8/LE 8, 638 steps and PTA present to monitor pt, pt with slight SOB after and RPE 3-4/10 Leg Press: seat 6 Bil 90# x 20, then 100# x 20, Rt LE 45# 2 x 20, 55# 2 x 20; short rest breaks between sets Neuro Muscular Re Ed Alternated fingertip touch to cone on mat table in partial T stance with Rt SLS and Lt TTWB as pt unable to independently SLS as of yet x 5 each side with SBA for safety In // bars: Rt UE Farmer's hold 10# KB with alternating toe taps on 6 step x20 each, VC's to keep core engaged for stability Therapeutic Activities  Bil sidestepping in clinic with yellow loop above knees x 15 ft, 3 laps each direction Sit to stand with yellow loop above knees and 5# kettle bell holds x 20 with demo and VC's to keep knees hip width   PATIENT EDUCATION:  Education details: Access Code: [email or web address] Person educated: Patient Education method: Explanation, Demonstration, and Handouts Education comprehension: verbalized understanding and returned demonstration  HOME EXERCISE PROGRAM: Access Code: [email or web address] (use new access code), [id number] Access Code: [email or web address] URL: [email or web address] Date: 02/26/2024 Prepared by: Berwyn Knights  Exercises - Standing Hip Flexion with Resistance (Mirrored)  - 1 x daily - 7 x weekly - 1 sets - 10 reps - 3 hold - Standing Hip Extension with Resistance  - 1 x daily - 7 x weekly - 1 sets - 10 reps - 3 hold - Standing Hip Abduction with Theraband Resistance  - 1 x daily - 7 x weekly - 1 sets - 10 reps - 3 hold - Supine Bridge  - 1 x daily - 7  x weekly - 2 sets - 10 reps - 5 hold - Seated Long Arc Quad with Ankle Weight  - 1 x daily - 7 x weekly - 2 sets - 10 reps - 5 hold - Seated March  - 2 x daily - 7 x weekly - 3 sets - 10 reps - Sit to Stand Without Arm Support  - 2 x daily - 7 x weekly - 2 sets - 5-10 reps - Standing Tandem Balance with Counter Support  - 1 x  daily - 7 x weekly - 5 reps - 30-60 hold - Standing Single Leg Stance with Counter Support  - 1 x daily - 7 x weekly - 1 sets - 5 reps - 30-60 hold - Standing Heel Raise with Support  - 1 x daily - 7 x weekly - 3 sets - 10 reps - 3 hold - Runner's Climb  - 2 x daily - 7 x weekly - 2-3 sets - 10 reps - 3-5 hold - Seated Hamstring Stretch  - 1 x daily - 7 x weekly - 1 sets - 3 reps - 20 hold - Standing Hip Flexor Stretch  - 2 x daily - 7 x weekly - 1 sets - 3 reps - 20 hold  ASSESSMENT: CLINICAL IMPRESSION: Pt continues to make steady progress.  She is using her cane for only long distances in the community.  Min antalgia on level surfaces and continues to work on single limb stability and symmetry with gait.  Increased weight and reps with sit to stand today. PT monitored throughout session for symmetry, stability and form.  Patient will benefit from skilled PT to address the below impairments and improve overall function.   OBJECTIVE IMPAIRMENTS: Abnormal gait, decreased activity tolerance, decreased balance, decreased mobility, difficulty walking, decreased ROM, decreased strength, hypomobility, increased fascial restrictions, increased muscle spasms, impaired flexibility, and pain.   ACTIVITY LIMITATIONS: carrying, lifting, standing, squatting, stairs, transfers, dressing, hygiene/grooming, locomotion level, and caring for others  PARTICIPATION LIMITATIONS: meal prep, cleaning, laundry, driving, shopping, community activity, and church  PERSONAL FACTORS: Age, Time since onset of injury/illness/exacerbation, and 1 comorbidity: breast cancer  are also affecting patient's functional outcome.   REHAB POTENTIAL: Good  CLINICAL DECISION MAKING: Evolving/moderate complexity  EVALUATION COMPLEXITY: Moderate   GOALS: Goals reviewed with patient? Yes  SHORT TERM GOALS: Target date: 03/20/2024   Be independent in initial HEP Baseline: 03/04/24 - Pt is independent with initial HEP Goal  status: MET   2.  Perform sit to stand transition without UE support with neutral hips due to improved functional strength  Baseline: 03/04/24 - increased to 7 reps with 2/7 no UE support; 03/13/24 - with controlled motion pt able to perform correctly Goal status: MET  3.  Improve 6 min walk test to > or = to 750 feet to improve community distance  Baseline: 552 feet; 03/04/24 - 808 ft with walker Goal status: MET  4.  Report > or = to 30% reduction in Rt hip pain with daily tasks  Baseline: 03/04/24 - 30% with pain meds Goal status: MET    LONG TERM GOALS: Target date: 06/03/2024   Be independent in advanced HEP Baseline: independent in current HEP Goal status: ONGOING  2. Improved PSFS (patient specific functional scale) to > or = to 6/10 for getting on/off the floor to use foam roller  Baseline: 5/10 (04/08/24) Goal status: NEW  3.  Improve 6 min walk test to > or = to 1200 feet to  improve community ambulation  Baseline: 1019 with cane (04/08/24); 04/23/24 - 979 ft with SPC (pt was slowed some today by busyness of clinic) Goal status: ONGOING  4.  Wean from cane for all distances due to symmetry with ambulation on level surfaces  Baseline: antalgia without cane, mild antalgia with cane; 04/23/24 - pt ambulates 75% of time without SPC at home, and very mild to no antalgic gait with SPC Goal status: ONGOING   5.  Report < or = to 3/10 RPE with 6 min walk test due to improved endurance and strength  Baseline: 6-7/10; 04/23/24 - 2-3/10 after 6 min walk test today Goal status: MET   PLAN:  PT FREQUENCY: 2x/week  PT DURATION: 8 weeks  PLANNED INTERVENTIONS: 97110-Therapeutic exercises, 97530- Therapeutic activity, 97112- Neuromuscular re-education, 97535- Self Care, 02859- Manual therapy, [phone]- Canalith repositioning, [id number]- Aquatic Therapy, [id number]- Electrical stimulation (unattended), 20560 (1-2 muscles), 20561 (3+ muscles)- Dry Needling, Patient/Family education, Balance  training, Stair training, Taping, Joint mobilization, Scar mobilization, Vestibular training, Cryotherapy, and Moist heat  PLAN FOR NEXT SESSION: Cont to work on functional tasks and mobility, cont high level balance tasks including stepping over obstacles as SLS is challenging and lifting Rt knee; cont sit to stand with staggered stance working towards Rt single leg sit to stand if able and other Rt SLS activities. 2 more sessions   Burnard Joy, PT 05/27/2024 12:28 PM   [facility] [address], [address] [place], [place] [address] Phone # [phone] Fax [phone]  "

## 2024-05-29 ENCOUNTER — Ambulatory Visit

## 2024-05-29 DIAGNOSIS — R293 Abnormal posture: Secondary | ICD-10-CM

## 2024-05-29 DIAGNOSIS — R2689 Other abnormalities of gait and mobility: Secondary | ICD-10-CM

## 2024-05-29 DIAGNOSIS — M6281 Muscle weakness (generalized): Secondary | ICD-10-CM | POA: Diagnosis not present

## 2024-05-29 DIAGNOSIS — M79604 Pain in right leg: Secondary | ICD-10-CM

## 2024-05-29 NOTE — Therapy (Signed)
 " OUTPATIENT PHYSICAL THERAPY LOWER EXTREMITY TREATMENT   Patient Name: Marie Day MRN: [id number] DOB:14-Nov-1945, 79 y.o., female Today's Date: 05/29/2024     END OF SESSION:  PT End of Session - 05/29/24 1329     Visit Number 29    Date for Recertification  06/03/24    Authorization Type [employer]-no auth required    Progress Note Due on Visit 30    PT Start Time 1231    PT Stop Time 1315    PT Time Calculation (min) 44 min    Activity Tolerance Patient tolerated treatment well    Behavior During Therapy WFL for tasks assessed/performed                       Past Medical History:  Diagnosis Date   Arthritis    Breast cancer (HCC)    Cancer (HCC)    Past Surgical History:  Procedure Laterality Date   BREAST BIOPSY Right 01/30/2023   US  [name] BX W LOC DEV 1ST LESION IMG BX SPEC US  GUIDE 01/30/2023 [facility]   BREAST RECONSTRUCTION WITH PLACEMENT OF TISSUE EXPANDER AND FLEX HD (ACELLULAR HYDRATED DERMIS) Right 06/29/2021   Procedure: RIGHT BREAST RECONSTRUCTION WITH PLACEMENT OF TISSUE EXPANDER AND FLEX HD (ACELLULAR HYDRATED DERMIS);  Surgeon: Elisabeth Craig RAMAN, MD;  Location: [facility];  Service: Plastics;  Laterality: Right;   BREAST REDUCTION WITH MASTOPEXY Left 06/30/2022   Procedure: LEFT BREAST REDUCTION WITH MASTOPEXY;  Surgeon: Lowery Estefana RAMAN, DO;  Location: [facility];  Service: Plastics;  Laterality: Left;   INTRAMEDULLARY (IM) NAIL INTERTROCHANTERIC Right 12/10/2023   Procedure: FIXATION, FRACTURE, INTERTROCHANTERIC, WITH INTRAMEDULLARY ROD;  Surgeon: Georgina Ozell LABOR, MD;  Location: [facility];  Service: Orthopedics;  Laterality: Right;   MASTECTOMY Right 06/29/2021   MASTECTOMY W/ SENTINEL NODE BIOPSY Right 06/29/2021   Procedure: RIGHT MASTECTOMY WITH SENTINEL LYMPH NODE BIOPSY;  Surgeon: Vanderbilt Ned, MD;  Location: [facility];  Service: General;  Laterality: Right;   PORT-A-CATH  REMOVAL Left 06/30/2022   Procedure: REMOVAL PORT-A-CATH;  Surgeon: Lowery Estefana RAMAN, DO;  Location: [facility];  Service: Plastics;  Laterality: Left;   REMOVAL OF TISSUE EXPANDER AND PLACEMENT OF IMPLANT Right 06/30/2022   Procedure: REMOVAL OF TISSUE EXPANDER AND PLACEMENT OF IMPLANT;  Surgeon: Lowery Estefana RAMAN, DO;  Location: [facility];  Service: Plastics;  Laterality: Right;   WISDOM TOOTH EXTRACTION     Patient Active Problem List   Diagnosis Date Noted   Osteoporosis 01/01/2024   Elevated antinuclear antibody (ANA) level 09/27/2023   Insomnia 09/18/2023   Dyslipidemia 03/24/2023   Body mass index (BMI) 24.0-24.9, adult 02/28/2023   Joint swelling 02/28/2023   Other specified abnormal immunological findings in serum 02/28/2023   Pain in limb 02/28/2023   Tinnitus 12/21/2022   Breast cancer (HCC) 12/21/2022   Joint stiffness 09/01/2022   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   Polyarthralgia 02/08/2022   S/P breast reconstruction 06/29/2021   Malignant neoplasm of upper-outer quadrant of right breast in female, estrogen receptor negative (HCC) 12/21/2020   Anemia 10/27/2020   Prediabetes 10/27/2020   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  Not sleeping well   From MD note 01/25/24:  Patient states that she is doing well at this point.  She is ambulating with a walker. She is able to ambulate up and down stairs as well. She is not having any consistent pain in the hip. She is sparingly using hydrocodone  to control her pain when she does have it. She has not noticed any redness or drainage around her incisions.   PERTINENT HISTORY: Malignant neoplasm of upper-outer quadrant of right breast in female, estrogen receptor negative (HCC) 2023, IM rod placement  PAIN: 05/27/24 PAIN:  Are you having pain? No, not currently  PRECAUTIONS: Other: breast cancer and mastectomy   RED FLAGS: None   WEIGHT BEARING RESTRICTIONS: No WBAT per MD   FALLS:  Has patient fallen in last 6 months? Yes. Number of falls 1 on 12/10/23 with IM rod placement on Rt  LIVING ENVIRONMENT: Lives with: lives with their family Lives in: House/apartment Stairs: Yes: Internal: 4 steps; on right going up Has following equipment at home: [name] - 2 wheeled  PLOF: Independent and Leisure: none  PATIENT GOALS: improve mobility, walk without walker  NEXT MD VISIT: 03/31/24  OBJECTIVE:  Note: Objective measures were completed at Evaluation unless otherwise noted.   COGNITION: Overall cognitive status: Within functional limits for tasks assessed     SENSATION: WFL   POSTURE: rounded shoulders, forward head, and weight shift left  PALPATION: Tender over incisions in the Rt anterior and lateral thigh  LOWER EXTREMITY ROM: WFLs   LOWER EXTREMITY MMT: Rt hip 4-/5, Lt 4+/5, bil knees 4+/5   FUNCTIONAL TESTS:  02/14/24 5 times sit to stand:with UE support 19.30 seconds  6 minute walk test: 552 feet with walker with 4/10 RPE -age related norm is 1250 feet  03/04/24:  7 times sit to stand (30 sec): 2 without UE support/ then remaining with UE support 6 minute walk test: 808 ft with walker with 2-3/10 RPE   04/08/24:  5x sit to stand: 9.59 seconds  6 min walk test: 1019 feet with cane- RPE 6-7/10 (age related norm is 1277 feet)  PSFS :  getting on to floor to use foam roller: 5/10 (04/08/24)  GAIT: Distance walked: 50 Assistive device utilized: Environmental Consultant - 2 wheeled Level of assistance: Modified independence Comments: slow mobility, reduced step length                                                                                                                             TREATMENT DATE:   05/29/24 Therapeutic Exercises NuStep level 8 x 10 mins; UE 8/LE 8  PT present to monitor pt Seated EOB bil HS stretch x 3  reps, 30 sec Leg Press: seat 6 Bil 100# 2x 20, Rt LE 45# 2 x 20, Lt: 55# 2 x 20; short rest breaks between sets  Neuro Muscular Re Ed In parallel bars:  star tap to 4 green cones with vcs to not step hard enough to collapse the cone x 6 bil legs In // bars: Rt UE Farmer's hold 10# KB with alternating toe taps on 8 step x20 each, VC's to keep core engaged for stability  Therapeutic Activities  Bil sidestepping in clinic with yellow loop at ankles x 15 ft, 3 laps each direction Sit to stand with yellow loop above knees and 10# kettle bell holds 4x5  with demo and VC's to keep knees hip width Farmer's carry around clinic with 10# kettlebell: 1 lap each hand   05/27/24 Therapeutic Exercises NuStep level 8 x 10 mins; UE 8/LE 8  PT present to monitor pt Seated EOB bil HS stretch x 3 reps, 30 sec Leg Press: seat 6 Bil 100# 2x 20, Rt LE 45# 2 x 20, Lt: 55# 2 x 20; short rest breaks between sets  Neuro Muscular Re Ed In parallel bars:  star tap to 4 green cones with vcs to not step hard enough to collapse the cone x 6 bil legs In // bars: Rt UE Farmer's hold 10# KB with alternating toe taps on 8 step x20 each, VC's to keep core engaged for stability  Therapeutic Activities  Bil sidestepping in clinic with yellow loop at ankles x 15 ft, 3 laps each direction Lateral step up on 6 step 2x10 Sit to stand with yellow loop above knees and 10# kettle bell holds 4x5  with demo and VC's to keep knees hip width Farmer's  carry around clinic with 10# kettlebell: 1 lap each hand   05/20/24 Therapeutic Exercises NuStep level 8 x 10 mins; UE 8/LE 8, 600 steps and PT present to monitor pt Seated EOB bil HS stretch x 3 reps, 30 sec Leg Press: seat 6 Bil 100# 2x 20, Rt LE 45# 2 x 20, Lt: 55# 2 x 20; short rest breaks between sets Neuro Muscular Re Ed In parallel bars:  star tap to 4 green cones with vcs to not step hard enough to collapse the cone x 6 bil legs In // bars: Rt UE Farmer's hold 10# KB with alternating toe taps on 8 step x20 each, VC's to keep core engaged for stability  Therapeutic Activities  Bil sidestepping in clinic with yellow loop at ankles x 15 ft, 3 laps each direction Sit to stand with yellow loop above knees and 5# kettle bell holds 4x5  with demo and VC's to keep knees hip width   PATIENT EDUCATION:  Education details: Access Code: [email or web address] Person educated: Patient Education method: Explanation, Demonstration, and Handouts Education comprehension: verbalized understanding and returned demonstration  HOME EXERCISE PROGRAM: Access Code: [email or web address] (use new access code), [id number] Access Code: [email or web address] URL: [email or web address] Date: 02/26/2024 Prepared by: Berwyn Knights  Exercises - Standing Hip Flexion with Resistance (Mirrored)  - 1 x daily - 7 x weekly - 1 sets - 10 reps - 3 hold - Standing Hip Extension with Resistance  - 1 x daily - 7 x weekly - 1 sets - 10 reps - 3 hold - Standing Hip Abduction with Theraband Resistance  - 1 x daily - 7 x weekly - 1 sets - 10 reps - 3 hold - Supine Bridge  - 1 x daily - 7 x weekly -  2 sets - 10 reps - 5 hold - Seated Long Arc Quad with Ankle Weight  - 1 x daily - 7 x weekly - 2 sets - 10 reps - 5 hold - Seated March  - 2 x daily - 7 x weekly - 3 sets - 10 reps - Sit to Stand Without Arm Support  - 2 x daily - 7 x weekly - 2 sets - 5-10 reps - Standing Tandem Balance with Counter Support  - 1 x daily - 7 x weekly - 5 reps  - 30-60 hold - Standing Single Leg Stance with Counter Support  - 1 x daily - 7 x weekly - 1 sets - 5 reps - 30-60 hold - Standing Heel Raise with Support  - 1 x daily - 7 x weekly - 3 sets - 10 reps - 3 hold - Runner's Climb  - 2 x daily - 7 x weekly - 2-3 sets - 10 reps - 3-5 hold - Seated Hamstring Stretch  - 1 x daily - 7 x weekly - 1 sets - 3 reps - 20 hold - Standing Hip Flexor Stretch  - 2 x daily - 7 x weekly - 1 sets - 3 reps - 20 hold  ASSESSMENT: CLINICAL IMPRESSION: Pt continues to make steady progress.  She is using her cane for only long distances in the community.  Min antalgia on level surfaces and continues to work on single limb stability and symmetry with gait.  Fatigue with sit to stand transition today.  Overall, pt is compliant with HEP and is able to do more independently. PT provided verbal cues and supervision throughout session.  Patient will benefit from skilled PT to address the below impairments and improve overall function.   OBJECTIVE IMPAIRMENTS: Abnormal gait, decreased activity tolerance, decreased balance, decreased mobility, difficulty walking, decreased ROM, decreased strength, hypomobility, increased fascial restrictions, increased muscle spasms, impaired flexibility, and pain.   ACTIVITY LIMITATIONS: carrying, lifting, standing, squatting, stairs, transfers, dressing, hygiene/grooming, locomotion level, and caring for others  PARTICIPATION LIMITATIONS: meal prep, cleaning, laundry, driving, shopping, community activity, and church  PERSONAL FACTORS: Age, Time since onset of injury/illness/exacerbation, and 1 comorbidity: breast cancer  are also affecting patient's functional outcome.   REHAB POTENTIAL: Good  CLINICAL DECISION MAKING: Evolving/moderate complexity  EVALUATION COMPLEXITY: Moderate   GOALS: Goals reviewed with patient? Yes  SHORT TERM GOALS: Target date: 03/20/2024   Be independent in initial HEP Baseline: 03/04/24 - Pt is  independent with initial HEP Goal status: MET   2.  Perform sit to stand transition without UE support with neutral hips due to improved functional strength  Baseline: 03/04/24 - increased to 7 reps with 2/7 no UE support; 03/13/24 - with controlled motion pt able to perform correctly Goal status: MET  3.  Improve 6 min walk test to > or = to 750 feet to improve community distance  Baseline: 552 feet; 03/04/24 - 808 ft with walker Goal status: MET  4.  Report > or = to 30% reduction in Rt hip pain with daily tasks  Baseline: 03/04/24 - 30% with pain meds Goal status: MET    LONG TERM GOALS: Target date: 06/03/2024   Be independent in advanced HEP Baseline: independent in current HEP Goal status: ONGOING  2. Improved PSFS (patient specific functional scale) to > or = to 6/10 for getting on/off the floor to use foam roller  Baseline: 5/10 (04/08/24) Goal status: NEW  3.  Improve 6 min walk test  to > or = to 1200 feet to improve community ambulation  Baseline: 1019 with cane (04/08/24); 04/23/24 - 979 ft with SPC (pt was slowed some today by busyness of clinic) Goal status: ONGOING  4.  Wean from cane for all distances due to symmetry with ambulation on level surfaces  Baseline: antalgia without cane, mild antalgia with cane; 04/23/24 - pt ambulates 75% of time without SPC at home, and very mild to no antalgic gait with SPC Goal status: ONGOING   5.  Report < or = to 3/10 RPE with 6 min walk test due to improved endurance and strength  Baseline: 6-7/10; 04/23/24 - 2-3/10 after 6 min walk test today Goal status: MET   PLAN:  PT FREQUENCY: 2x/week  PT DURATION: 8 weeks  PLANNED INTERVENTIONS: 97110-Therapeutic exercises, 97530- Therapeutic activity, 97112- Neuromuscular re-education, 97535- Self Care, 02859- Manual therapy, [phone]- Canalith repositioning, [id number]- Aquatic Therapy, H9716- Electrical stimulation (unattended), 20560 (1-2 muscles), 20561 (3+ muscles)- Dry Needling,  Patient/Family education, Balance training, Stair training, Taping, Joint mobilization, Scar mobilization, Vestibular training, Cryotherapy, and Moist heat  PLAN FOR NEXT SESSION:  1 more session probable  Burnard Joy, PT 05/29/2024 1:31 PM   [facility] [address] [place], [place] [address] Phone # [phone] Fax [phone]  "

## 2024-06-03 ENCOUNTER — Ambulatory Visit: Admitting: Orthopedic Surgery

## 2024-06-03 ENCOUNTER — Ambulatory Visit: Payer: Self-pay

## 2024-06-03 ENCOUNTER — Ambulatory Visit

## 2024-06-03 DIAGNOSIS — S72141G Displaced intertrochanteric fracture of right femur, subsequent encounter for closed fracture with delayed healing: Secondary | ICD-10-CM

## 2024-06-03 DIAGNOSIS — M79604 Pain in right leg: Secondary | ICD-10-CM

## 2024-06-03 DIAGNOSIS — M6281 Muscle weakness (generalized): Secondary | ICD-10-CM

## 2024-06-03 DIAGNOSIS — R293 Abnormal posture: Secondary | ICD-10-CM

## 2024-06-03 DIAGNOSIS — R2689 Other abnormalities of gait and mobility: Secondary | ICD-10-CM

## 2024-06-03 NOTE — Progress Notes (Signed)
 Orthopedic Surgery Post-operative Office Visit   Procedure: right intertrochanteric femur fracture s/p CMN Date of Surgery: 12/10/2023 (~6 months post-op)   Assessment: Patient is a 79 y.o. who is doing well after surgery     Plan: -Operative plans complete -Weightbearing as tolerated -No activity restrictions -Patient has not been needing the tramadol , so can use OTC medications as needed for pain relief -Return to office in 6 months, x-rays needed at next visit: AP/lateral right hip   ___________________________________________________________________________     Subjective: Patient is only having mild pain over the lateral hip when she lays on that side. She otherwise does not have pain in her hip or leg. She has transitioned to walking without any assistive devices. She is no longer taking any tramadol  for pain. She is pleased with how she is doing at this point.   Objective:   General: no acute distress, appropriate affect Neurologic: alert, answering questions appropriately, following commands Respiratory: unlabored breathing on room air Skin: incisions are well healed   MSK (RLE): Ambulating without assistive devices, non antalgic gait, EHL/TA/GSC intact, sensation intact to light touch in sural/saphenous/deep peroneal/superficial peroneal/tibial nerve distributions, foot warm and well-perfused   Imaging: X-rays of the right hip from 06/03/2024 were independently reviewed and interpreted, showing a short cephlaomedullary rod in place. No lucency seen around the lag screws or the interlocking screw.  Fracture alignment appears similar to prior films on 02/29/2024.  There is still minimal displacement.  Persistent fracture line seen particularly inferior to the lag screws.  No new fracture seen.  No dislocation seen.    Patient name: Marie Day Patient MRN: [id number] Date of visit: 06/03/2024

## 2024-06-03 NOTE — Therapy (Signed)
 " OUTPATIENT PHYSICAL THERAPY LOWER EXTREMITY TREATMENT   Patient Name: Marie Day MRN: [id number] DOB:Mar 08, 1946, 79 y.o., female Today's Date: 06/03/2024    Progress Note Reporting Period 04/25/25 to 06/03/24  See note below for Objective Data and Assessment of Progress/Goals.     END OF SESSION:  PT End of Session - 06/03/24 1228     Visit Number 30    Authorization Type [employer]-no auth required    PT Start Time 1147    PT Stop Time 1229    PT Time Calculation (min) 42 min    Activity Tolerance Patient tolerated treatment well    Behavior During Therapy WFL for tasks assessed/performed                        Past Medical History:  Diagnosis Date   Arthritis    Breast cancer (HCC)    Cancer (HCC)    Past Surgical History:  Procedure Laterality Date   BREAST BIOPSY Right 01/30/2023   US  [name] BX W LOC DEV 1ST LESION IMG BX SPEC US  GUIDE 01/30/2023 [facility]MO[facility]   BREAST RECONSTRUCTION WITH PLACEMENT OF TISSUE EXPANDER AND FLEX HD (ACELLULAR HYDRATED DERMIS) Right 06/29/2021   Procedure: RIGHT BREAST RECONSTRUCTION WITH PLACEMENT OF TISSUE EXPANDER AND FLEX HD (ACELLULAR HYDRATED DERMIS);  Surgeon: Elisabeth Craig RAMAN, MD;  Location: [facility];  Service: Plastics;  Laterality: Right;   BREAST REDUCTION WITH MASTOPEXY Left 06/30/2022   Procedure: LEFT BREAST REDUCTION WITH MASTOPEXY;  Surgeon: Lowery Estefana RAMAN, DO;  Location: [facility];  Service: Plastics;  Laterality: Left;   INTRAMEDULLARY (IM) NAIL INTERTROCHANTERIC Right 12/10/2023   Procedure: FIXATION, FRACTURE, INTERTROCHANTERIC, WITH INTRAMEDULLARY ROD;  Surgeon: Georgina Ozell LABOR, MD;  [facility];  Service: Orthopedics;  Laterality: Right;   MASTECTOMY Right 06/29/2021   MASTECTOMY W/ SENTINEL NODE BIOPSY Right 06/29/2021   Procedure: RIGHT MASTECTOMY WITH SENTINEL LYMPH NODE BIOPSY;  Surgeon: Vanderbilt Ned, MD;  Location: [facility];   Service: General;  Laterality: Right;   PORT-A-CATH REMOVAL Left 06/30/2022   Procedure: REMOVAL PORT-A-CATH;  Surgeon: Lowery Estefana RAMAN, DO;  Location: [facility];  Service: Plastics;  Laterality: Left;   REMOVAL OF TISSUE EXPANDER AND PLACEMENT OF IMPLANT Right 06/30/2022   Procedure: REMOVAL OF TISSUE EXPANDER AND PLACEMENT OF IMPLANT;  Surgeon: Lowery Estefana RAMAN, DO;  Location:  [facility];  Service: Plastics;  Laterality: Right;   WISDOM TOOTH EXTRACTION     Patient Active Problem List   Diagnosis Date Noted   Osteoporosis 01/01/2024   Elevated antinuclear antibody (ANA) level 09/27/2023   Insomnia 09/18/2023   Dyslipidemia 03/24/2023   Body mass index (BMI) 24.0-24.9, adult 02/28/2023   Joint swelling 02/28/2023   Other specified abnormal immunological findings in serum 02/28/2023   Pain in limb 02/28/2023   Tinnitus 12/21/2022   Breast cancer (HCC) 12/21/2022   Joint stiffness 09/01/2022   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   Polyarthralgia 02/08/2022   S/P breast reconstruction 06/29/2021   Malignant neoplasm of upper-outer quadrant of right breast in female, estrogen receptor negative (HCC) 12/21/2020   Anemia 10/27/2020   Prediabetes 10/27/2020   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   Unspecified age-related cataract 12/14/2015    PCP: Kennyth Bart, MD  REFERRING PROVIDER: Georgina Ozell, MD  REFERRING DIAG:  Diagnosis  S72.141G (ICD-10-CM) - Displaced intertrochanteric fracture of right femur, subsequent encounter for closed fracture with  delayed healing    THERAPY DIAG:  Muscle weakness (generalized)  Other abnormalities of gait and mobility  Pain in right leg  Abnormal posture  Rationale for Evaluation and Treatment: Rehabilitation  ONSET DATE: 12/10/23- fall with femur fracture   SUBJECTIVE:   SUBJECTIVE STATEMENT: Ready for D/C.  I am going to go to the gym when I am able to.    From MD note 01/25/24:  Patient states that she is doing well at this point. She is ambulating with a walker. She is able to ambulate up and down stairs as well. She is not having any consistent pain in the hip. She is sparingly using hydrocodone  to control her pain when she does have it. She has not noticed any redness or drainage around her incisions.   PERTINENT HISTORY: Malignant neoplasm of upper-outer quadrant of right breast in female, estrogen receptor negative (HCC) 2023, IM rod placement  PAIN: 06/03/24 PAIN:  Are you having pain? No, not currently  PRECAUTIONS: Other: breast cancer and mastectomy   RED FLAGS: None   WEIGHT BEARING RESTRICTIONS: No WBAT per MD   FALLS:  Has patient fallen in last 6 months? Yes. Number of falls 1 on 12/10/23 with IM rod placement on Rt  LIVING ENVIRONMENT: Lives with: lives with their family Lives in: House/apartment Stairs: Yes: Internal: 4 steps; on right going up Has following equipment at home: [name] - 2 wheeled  PLOF: Independent and Leisure: none  PATIENT GOALS: improve mobility, walk without walker  NEXT MD VISIT: 03/31/24  OBJECTIVE:  Note: Objective measures were completed at Evaluation unless otherwise noted.   COGNITION: Overall cognitive status: Within functional limits for tasks assessed     SENSATION: WFL   POSTURE: rounded shoulders, forward head, and weight shift left  PALPATION: Tender over incisions in the Rt anterior and lateral thigh  LOWER EXTREMITY ROM: WFLs   LOWER EXTREMITY MMT: Rt hip 4-/5, Lt 4+/5, bil knees 4+/5   FUNCTIONAL TESTS:  02/14/24 5 times sit to stand:with UE support 19.30 seconds  6 minute walk test: 552 feet with walker with 4/10 RPE -age related norm is 1250 feet  03/04/24:  7 times sit to stand (30 sec): 2 without UE support/ then remaining with UE support 6 minute walk test: 808 ft with walker with 2-3/10 RPE   04/08/24:  5x sit to stand: 9.59 seconds  6 min walk test:  1019 feet with cane- RPE 6-7/10 (age related norm is 1277 feet)  PSFS : getting on to floor to use foam roller: 5/10 (04/08/24)  06/03/24: 6 min walk test: 1204 feet with 3/10 RPE PSFS getting on/off floor: 6/10   GAIT: Distance walked: 50 Assistive device utilized: Walker - 2 wheeled Level of assistance: Modified independence Comments: slow mobility, reduced step length  TREATMENT DATE:   06/03/24 Therapeutic Exercises NuStep level 8 x 10 mins; UE 8/LE 8  PT present to monitor pt (710 steps) Seated EOB bil HS stretch x 3 reps, 30 sec Leg Press: seat 6 Bil 100# 2x 20, Rt LE 45# 2 x 20, Lt: 55# 2 x 20; short rest breaks between sets  Neuro Muscular Re Ed In parallel bars:  star tap to 4 green cones with vcs to not step hard enough to collapse the cone x 6 bil legs In // bars: Rt UE Farmer's hold 10# KB with alternating toe taps on 8 step x20 each, VC's to keep core engaged for stability  Therapeutic Activities  Bil sidestepping in clinic with yellow loop at ankles x 15 ft, 3 laps each direction 6 min walk test: see above Sit to stand with yellow loop above knees and 10# kettle bell holds 4x5  with demo and VC's to keep knees hip width Farmer's carry around clinic with 10# kettlebell: 1 lap each hand    05/29/24 Therapeutic Exercises NuStep level 8 x 10 mins; UE 8/LE 8  PT present to monitor pt Seated EOB bil HS stretch x 3 reps, 30 sec Leg Press: seat 6 Bil 100# 2x 20, Rt LE 45# 2 x 20, Lt: 55# 2 x 20; short rest breaks between sets  Neuro Muscular Re Ed In parallel bars:  star tap to 4 green cones with vcs to not step hard enough to collapse the cone x 6 bil legs In // bars: Rt UE Farmer's hold 10# KB with alternating toe taps on 8 step x20 each, VC's to keep core engaged for stability  Therapeutic Activities  Bil sidestepping in clinic with yellow loop  at ankles x 15 ft, 3 laps each direction Sit to stand with yellow loop above knees and 10# kettle bell holds 4x5  with demo and VC's to keep knees hip width Farmer's carry around clinic with 10# kettlebell: 1 lap each hand   05/27/24 Therapeutic Exercises NuStep level 8 x 10 mins; UE 8/LE 8  PT present to monitor pt Seated EOB bil HS stretch x 3 reps, 30 sec Leg Press: seat 6 Bil 100# 2x 20, Rt LE 45# 2 x 20, Lt: 55# 2 x 20; short rest breaks between sets  Neuro Muscular Re Ed In parallel bars:  star tap to 4 green cones with vcs to not step hard enough to collapse the cone x 6 bil legs In // bars: Rt UE Farmer's hold 10# KB with alternating toe taps on 8 step x20 each, VC's to keep core engaged for stability  Therapeutic Activities  Bil sidestepping in clinic with yellow loop at ankles x 15 ft, 3 laps each direction Lateral step up on 6 step 2x10 Sit to stand with yellow loop above knees and 10# kettle bell holds 4x5  with demo and VC's to keep knees hip width Farmer's carry around clinic with 10# kettlebell: 1 lap each hand    PATIENT EDUCATION:  Education details: Access Code: [email or web address] Person educated: Patient Education method: Explanation, Demonstration, and Handouts Education comprehension: verbalized understanding and returned demonstration  HOME EXERCISE PROGRAM: Access Code: [email or web address] (use new access code), [id number] Access Code: [email or web address] URL: [email or web address] Date: 02/26/2024 Prepared by: Berwyn Knights  Exercises - Standing Hip Flexion with Resistance (Mirrored)  - 1 x daily - 7 x weekly - 1 sets - 10 reps - 3 hold - Standing Hip Extension with Resistance  - 1 x daily -  7 x weekly - 1 sets - 10 reps - 3 hold - Standing Hip Abduction with Theraband Resistance  - 1 x daily - 7 x weekly - 1 sets - 10 reps - 3 hold - Supine Bridge  - 1 x daily - 7 x weekly - 2 sets - 10 reps - 5 hold - Seated Long Arc Quad with Ankle Weight  - 1 x daily - 7 x  weekly - 2 sets - 10 reps - 5 hold - Seated March  - 2 x daily - 7 x weekly - 3 sets - 10 reps - Sit to Stand Without Arm Support  - 2 x daily - 7 x weekly - 2 sets - 5-10 reps - Standing Tandem Balance with Counter Support  - 1 x daily - 7 x weekly - 5 reps - 30-60 hold - Standing Single Leg Stance with Counter Support  - 1 x daily - 7 x weekly - 1 sets - 5 reps - 30-60 hold - Standing Heel Raise with Support  - 1 x daily - 7 x weekly - 3 sets - 10 reps - 3 hold - Runner's Climb  - 2 x daily - 7 x weekly - 2-3 sets - 10 reps - 3-5 hold - Seated Hamstring Stretch  - 1 x daily - 7 x weekly - 1 sets - 3 reps - 20 hold - Standing Hip Flexor Stretch  - 2 x daily - 7 x weekly - 1 sets - 3 reps - 20 hold  ASSESSMENT: CLINICAL IMPRESSION: Pt is ready for D/C.  She is using her cane for only long distances in the community.  Min antalgia on level surfaces and continues to work on single limb stability and symmetry with gait. 6 min walk distance is improved today and is nearly her age related norm.  Overall, pt is compliant with HEP and is able to do more independently. Pt will D/C to HEP today.    OBJECTIVE IMPAIRMENTS: Abnormal gait, decreased activity tolerance, decreased balance, decreased mobility, difficulty walking, decreased ROM, decreased strength, hypomobility, increased fascial restrictions, increased muscle spasms, impaired flexibility, and pain.   ACTIVITY LIMITATIONS: carrying, lifting, standing, squatting, stairs, transfers, dressing, hygiene/grooming, locomotion level, and caring for others  PARTICIPATION LIMITATIONS: meal prep, cleaning, laundry, driving, shopping, community activity, and church  PERSONAL FACTORS: Age, Time since onset of injury/illness/exacerbation, and 1 comorbidity: breast cancer  are also affecting patient's functional outcome.   REHAB POTENTIAL: Good  CLINICAL DECISION MAKING: Evolving/moderate complexity  EVALUATION COMPLEXITY: Moderate   GOALS: Goals  reviewed with patient? Yes  SHORT TERM GOALS: Target date: 03/20/2024   Be independent in initial HEP Baseline: 03/04/24 - Pt is independent with initial HEP Goal status: MET   2.  Perform sit to stand transition without UE support with neutral hips due to improved functional strength  Baseline: 03/04/24 - increased to 7 reps with 2/7 no UE support; 03/13/24 - with controlled motion pt able to perform correctly Goal status: MET  3.  Improve 6 min walk test to > or = to 750 feet to improve community distance  Baseline: 552 feet; 03/04/24 - 808 ft with walker Goal status: MET  4.  Report > or = to 30% reduction in Rt hip pain with daily tasks  Baseline: 03/04/24 - 30% with pain meds Goal status: MET    LONG TERM GOALS: Target date: 06/03/2024   Be independent in advanced HEP Baseline: 06/03/24 Goal status: MET  2. Improved  PSFS (patient specific functional scale) to > or = to 6/10 for getting on/off the floor to use foam roller  Baseline: 6/10 (06/03/24) Goal status: MET  3.  Improve 6 min walk test to > or = to 1200 feet to improve community ambulation  Baseline: 06/03/24 1204 feet  Goal status: MET  4.  Wean from cane for all distances due to symmetry with ambulation on level surfaces  Baseline: mild antalgia without cane, working on strength and symmetry (06/03/24) Goal status: partially met  5.  Report < or = to 3/10 RPE with 6 min walk test due to improved endurance and strength  Baseline: 3/10 (06/03/24) Goal status: MET   PLAN:  PHYSICAL THERAPY DISCHARGE SUMMARY  Visits from Start of Care: 30  Current functional level related to goals / functional outcomes: See above for current status.     Remaining deficits: Mild antalgia without cane  Pt is working on strength and endurance to improve symmetry.    Education / Equipment: HEP, gym exercises    Patient agrees to discharge. Patient goals were met. Patient is being discharged due to meeting the stated  rehab goals.   Burnard Joy, PT 06/03/2024 12:38 PM   [facility] [address] [place], [place] [address] Phone # [phone] Fax [phone]  "

## 2024-06-08 ENCOUNTER — Encounter: Payer: Self-pay | Admitting: Family Medicine

## 2024-06-10 ENCOUNTER — Other Ambulatory Visit: Payer: Self-pay | Admitting: *Deleted

## 2024-06-10 DIAGNOSIS — M810 Age-related osteoporosis without current pathological fracture: Secondary | ICD-10-CM

## 2024-06-10 NOTE — Telephone Encounter (Signed)
 Referral order

## 2024-06-10 NOTE — Progress Notes (Signed)
 Bone

## 2024-06-17 ENCOUNTER — Encounter: Admitting: Family Medicine

## 2024-06-28 ENCOUNTER — Ambulatory Visit: Payer: No Typology Code available for payment source | Admitting: Plastic Surgery

## 2024-06-28 ENCOUNTER — Encounter: Payer: Self-pay | Admitting: Plastic Surgery

## 2024-06-28 VITALS — BP 116/66 | HR 78

## 2024-06-28 DIAGNOSIS — Z9889 Other specified postprocedural states: Secondary | ICD-10-CM

## 2024-06-28 DIAGNOSIS — C50919 Malignant neoplasm of unspecified site of unspecified female breast: Secondary | ICD-10-CM

## 2024-06-28 NOTE — Progress Notes (Signed)
 "    Patient ID: Marie Day, female    DOB: January 26, 1946, 79 y.o.   MRN: [id number]   Chief Complaint  Patient presents with   Follow-up    The patient is a 79 yrs old female here for a follow up on her breast reconstruction.  In 2022 she was diagnosed with right breast cancer. She had a right mastectomy with expander placement and pectoralis muscle placement 06/2021.  She was radiated and had some asymmetry due breast size and ptosis of the left breast.  In February 2024 she had removal of the expander and placement of a mentor smooth round high profile gel 400 cc implant and a left mastopexy for symmetry. She is very pleased with her results.  She noticed some tightness in the right lateral breast area.  She does not want to do PT right now but she knows it is an option.  No areas of concern.      Review of Systems  Constitutional: Negative.   Eyes: Negative.   Respiratory: Negative.    Cardiovascular: Negative.   Gastrointestinal: Negative.   Endocrine: Negative.   Genitourinary: Negative.   Musculoskeletal: Negative.     Past Medical History:  Diagnosis Date   Arthritis    Breast cancer (HCC)    Cancer [facility])     Past Surgical History:  Procedure Laterality Date   BREAST BIOPSY Right 01/30/2023   US  [name] BX W LOC DEV 1ST LESION IMG BX SPEC US  GUIDE 01/30/2023 [facility]AMMOGRAPHY   BREAST RECONSTRUCTION WITH PLACEMENT OF TISSUE EXPANDER AND FLEX HD (ACELLULAR HYDRATED DERMIS) Right 06/29/2021   Procedure: RIGHT BREAST RECONSTRUCTION WITH PLACEMENT OF TISSUE EXPANDER AND FLEX HD (ACELLULAR HYDRATED DERMIS);  Surgeon: Elisabeth Craig RAMAN, MD;  Location: [facility];  Service: Plastics;  Laterality: Right;   BREAST REDUCTION WITH MASTOPEXY Left 06/30/2022   Procedure: LEFT BREAST REDUCTION WITH MASTOPEXY;  Surgeon: Lowery Estefana RAMAN, DO;  Location: [facility];  Service: Plastics;  Laterality: Left;   INTRAMEDULLARY (IM) NAIL INTERTROCHANTERIC Right  12/10/2023   Procedure: FIXATION, FRACTURE, INTERTROCHANTERIC, WITH INTRAMEDULLARY ROD;  Surgeon: Georgina Ozell LABOR, MD;  Location: [facility];  Service: Orthopedics;  Laterality: Right;   MASTECTOMY Right 06/29/2021   MASTECTOMY W/ SENTINEL NODE BIOPSY Right 06/29/2021   Procedure: RIGHT MASTECTOMY WITH SENTINEL LYMPH NODE BIOPSY;  Surgeon: Vanderbilt Ned, MD;  Location: [facility];  Service: General;  Laterality: Right;   PORT-A-CATH REMOVAL Left 06/30/2022   Procedure: REMOVAL PORT-A-CATH;  Surgeon: Lowery Estefana RAMAN, DO;  Location: [facility];  Service: Plastics;  Laterality: Left;   REMOVAL OF TISSUE EXPANDER AND PLACEMENT OF IMPLANT Right 06/30/2022   Procedure: REMOVAL OF TISSUE EXPANDER AND PLACEMENT OF IMPLANT;  Surgeon: Lowery Estefana RAMAN, DO;  Location: [facility];  Service: Plastics;  Laterality: Right;   WISDOM TOOTH EXTRACTION       Current Medications[1]   Objective:   Vitals:   06/28/24 1233  BP: 116/66  Pulse: 78  SpO2: 97%    Physical Exam Vitals reviewed.  Constitutional:      Appearance: Normal appearance.  HENT:     Head: Atraumatic.  Cardiovascular:     Rate and Rhythm: Normal rate.     Pulses: Normal pulses.  Pulmonary:     Effort: Pulmonary effort is normal.  Skin:    General: Skin is warm.     Capillary Refill: Capillary refill takes less than 2 seconds.  Neurological:     Mental Status: She is alert and oriented to person, place, and time.  Psychiatric:        Mood and Affect: Mood normal.        Behavior: Behavior normal.        Thought Content: Thought content normal.        Judgment: Judgment normal.     Assessment & Plan:  Malignant neoplasm of female breast, unspecified estrogen receptor status, unspecified laterality, unspecified site of breast (HCC)  S/P breast reconstruction  Plan for one year follow up.  We will talk about US  for the next year.  Call with any changes and if she wants to move  forward with PT.    Pictures were obtained of the patient and placed in the chart with the patient's or guardian's permission.   Estefana RAMAN Marie Mckillop, DO    [1]  Current Outpatient Medications:    alendronate  (FOSAMAX ) 70 MG tablet, TAKE 1 TABLET (70 MG TOTAL) BY MOUTH ONCE A WEEK. TAKE WITH A FULL GLASS OF WATER  ON AN EMPTY STOMACH., Disp: 12 tablet, Rfl: 3   gabapentin  (NEURONTIN ) 300 MG capsule, Take 1 capsule (300 mg total) by mouth at bedtime., Disp: 30 capsule, Rfl: 3   Glucosamine-Chondroitin (OSTEO BI-FLEX REGULAR STRENGTH PO), Take 1 Dose by mouth in the morning and at bedtime., Disp: , Rfl:    traZODone  (DESYREL ) 50 MG tablet, TAKE 0.5-1 TABLETS BY MOUTH AT BEDTIME AS NEEDED FOR SLEEP., Disp: 90 tablet, Rfl: 2   UNABLE TO FIND, Take 1 Dose by mouth as needed (sleep). CBD Gummies, Disp: , Rfl:    UNABLE TO FIND, Take 2 capsules by mouth in the morning. Fish oil & Tumeric, Disp: , Rfl:    Vitamin D-Vitamin K (VITAMIN K2-VITAMIN D3 PO), Take 1 tablet by mouth in the morning., Disp: , Rfl:    traMADol  (ULTRAM ) 50 MG tablet, Take by mouth every 6 (six) hours as needed., Disp: , Rfl:   "

## 2024-09-16 ENCOUNTER — Ambulatory Visit: Payer: Self-pay

## 2024-10-21 ENCOUNTER — Ambulatory Visit

## 2024-12-02 ENCOUNTER — Ambulatory Visit: Admitting: Orthopedic Surgery

## 2024-12-09 ENCOUNTER — Ambulatory Visit: Admitting: Orthopedic Surgery

## 2024-12-26 ENCOUNTER — Ambulatory Visit: Admitting: Hematology and Oncology

## 2025-04-08 ENCOUNTER — Encounter: Admitting: Family Medicine

## 2025-07-01 ENCOUNTER — Ambulatory Visit: Admitting: Plastic Surgery
